# Patient Record
Sex: Female | Born: 1945
Health system: Southern US, Community
[De-identification: ages and names within clinical notes are randomized; demographics above are authoritative.]

## PROBLEM LIST (undated history)

## (undated) DIAGNOSIS — C9110 Chronic lymphocytic leukemia of B-cell type not having achieved remission: Secondary | ICD-10-CM

## (undated) DIAGNOSIS — I219 Acute myocardial infarction, unspecified: Secondary | ICD-10-CM

## (undated) DIAGNOSIS — C801 Malignant (primary) neoplasm, unspecified: Secondary | ICD-10-CM

## (undated) DIAGNOSIS — E039 Hypothyroidism, unspecified: Secondary | ICD-10-CM

## (undated) DIAGNOSIS — I7090 Unspecified atherosclerosis: Secondary | ICD-10-CM

## (undated) DIAGNOSIS — J40 Bronchitis, not specified as acute or chronic: Secondary | ICD-10-CM

## (undated) DIAGNOSIS — I10 Essential (primary) hypertension: Secondary | ICD-10-CM

## (undated) DIAGNOSIS — I2510 Atherosclerotic heart disease of native coronary artery without angina pectoris: Secondary | ICD-10-CM

## (undated) DIAGNOSIS — E785 Hyperlipidemia, unspecified: Secondary | ICD-10-CM

## (undated) DIAGNOSIS — K219 Gastro-esophageal reflux disease without esophagitis: Secondary | ICD-10-CM

## (undated) DIAGNOSIS — G629 Polyneuropathy, unspecified: Secondary | ICD-10-CM

## (undated) DIAGNOSIS — B009 Herpesviral infection, unspecified: Secondary | ICD-10-CM

## (undated) DIAGNOSIS — J45909 Unspecified asthma, uncomplicated: Secondary | ICD-10-CM

## (undated) DIAGNOSIS — F329 Major depressive disorder, single episode, unspecified: Secondary | ICD-10-CM

## (undated) DIAGNOSIS — M549 Dorsalgia, unspecified: Secondary | ICD-10-CM

## (undated) DIAGNOSIS — R0600 Dyspnea, unspecified: Secondary | ICD-10-CM

## (undated) DIAGNOSIS — F32A Depression, unspecified: Secondary | ICD-10-CM

## (undated) DIAGNOSIS — F419 Anxiety disorder, unspecified: Secondary | ICD-10-CM

## (undated) DIAGNOSIS — G4730 Sleep apnea, unspecified: Secondary | ICD-10-CM

## (undated) DIAGNOSIS — E7800 Pure hypercholesterolemia, unspecified: Secondary | ICD-10-CM

## (undated) DIAGNOSIS — Z87442 Personal history of urinary calculi: Secondary | ICD-10-CM

## (undated) DIAGNOSIS — G2581 Restless legs syndrome: Secondary | ICD-10-CM

## (undated) DIAGNOSIS — Z972 Presence of dental prosthetic device (complete) (partial): Secondary | ICD-10-CM

## (undated) HISTORY — DX: Chronic lymphocytic leukemia of B-cell type not having achieved remission: C91.10

## (undated) HISTORY — DX: Hyperlipidemia, unspecified: E78.5

## (undated) HISTORY — DX: Herpesviral infection, unspecified: B00.9

## (undated) HISTORY — DX: Anxiety disorder, unspecified: F41.9

## (undated) HISTORY — DX: Depression, unspecified: F32.A

## (undated) HISTORY — DX: Major depressive disorder, single episode, unspecified: F32.9

## (undated) HISTORY — DX: Dorsalgia, unspecified: M54.9

## (undated) HISTORY — PX: VAGINAL HYSTERECTOMY: SUR661

## (undated) HISTORY — DX: Unspecified atherosclerosis: I70.90

## (undated) HISTORY — PX: DILATION AND CURETTAGE OF UTERUS: SHX78

## (undated) HISTORY — PX: EYE SURGERY: SHX253

## (undated) HISTORY — PX: TUBAL LIGATION: SHX77

## (undated) HISTORY — PX: FRACTURE SURGERY: SHX138

---

## 1997-07-24 DIAGNOSIS — I219 Acute myocardial infarction, unspecified: Secondary | ICD-10-CM

## 1997-07-24 HISTORY — DX: Acute myocardial infarction, unspecified: I21.9

## 1997-12-12 ENCOUNTER — Inpatient Hospital Stay (HOSPITAL_COMMUNITY): Admission: AD | Admit: 1997-12-12 | Discharge: 1997-12-15 | Payer: Self-pay | Admitting: Cardiology

## 2001-03-06 ENCOUNTER — Emergency Department (HOSPITAL_COMMUNITY): Admission: EM | Admit: 2001-03-06 | Discharge: 2001-03-06 | Payer: Self-pay | Admitting: *Deleted

## 2001-05-16 ENCOUNTER — Ambulatory Visit (HOSPITAL_COMMUNITY): Admission: RE | Admit: 2001-05-16 | Discharge: 2001-05-16 | Payer: Self-pay | Admitting: General Surgery

## 2001-07-19 ENCOUNTER — Ambulatory Visit (HOSPITAL_COMMUNITY): Admission: RE | Admit: 2001-07-19 | Discharge: 2001-07-19 | Payer: Self-pay | Admitting: *Deleted

## 2001-09-26 ENCOUNTER — Encounter: Payer: Self-pay | Admitting: Internal Medicine

## 2001-09-26 ENCOUNTER — Ambulatory Visit (HOSPITAL_COMMUNITY): Admission: RE | Admit: 2001-09-26 | Discharge: 2001-09-26 | Payer: Self-pay | Admitting: Internal Medicine

## 2006-01-04 ENCOUNTER — Emergency Department: Payer: Self-pay | Admitting: Emergency Medicine

## 2006-01-29 ENCOUNTER — Emergency Department: Payer: Self-pay | Admitting: Emergency Medicine

## 2006-02-27 ENCOUNTER — Ambulatory Visit: Payer: Self-pay | Admitting: Cardiovascular Disease

## 2006-03-01 ENCOUNTER — Emergency Department: Payer: Self-pay

## 2006-03-02 ENCOUNTER — Other Ambulatory Visit: Payer: Self-pay

## 2006-07-24 HISTORY — PX: CORONARY ANGIOPLASTY WITH STENT PLACEMENT: SHX49

## 2007-04-22 ENCOUNTER — Other Ambulatory Visit: Payer: Self-pay

## 2007-04-22 ENCOUNTER — Inpatient Hospital Stay: Payer: Self-pay | Admitting: Cardiovascular Disease

## 2007-10-17 ENCOUNTER — Emergency Department: Payer: Self-pay | Admitting: Emergency Medicine

## 2007-11-15 ENCOUNTER — Emergency Department: Payer: Self-pay | Admitting: Emergency Medicine

## 2007-12-26 ENCOUNTER — Emergency Department: Payer: Self-pay | Admitting: Emergency Medicine

## 2008-08-20 ENCOUNTER — Ambulatory Visit: Payer: Self-pay | Admitting: Gastroenterology

## 2009-05-07 ENCOUNTER — Emergency Department: Payer: Self-pay | Admitting: Emergency Medicine

## 2009-06-03 ENCOUNTER — Ambulatory Visit: Payer: Self-pay | Admitting: Family Medicine

## 2009-10-04 ENCOUNTER — Ambulatory Visit: Payer: Self-pay | Admitting: Ophthalmology

## 2009-10-18 ENCOUNTER — Ambulatory Visit: Payer: Self-pay | Admitting: Ophthalmology

## 2009-11-08 ENCOUNTER — Emergency Department: Payer: Self-pay | Admitting: Emergency Medicine

## 2009-12-01 ENCOUNTER — Ambulatory Visit: Payer: Self-pay | Admitting: Ophthalmology

## 2009-12-13 ENCOUNTER — Ambulatory Visit: Payer: Self-pay | Admitting: Ophthalmology

## 2009-12-28 ENCOUNTER — Ambulatory Visit: Payer: Self-pay | Admitting: Cardiovascular Disease

## 2010-01-18 ENCOUNTER — Emergency Department: Payer: Self-pay | Admitting: Unknown Physician Specialty

## 2010-05-04 ENCOUNTER — Ambulatory Visit: Payer: Self-pay | Admitting: Gastroenterology

## 2010-05-06 LAB — PATHOLOGY REPORT

## 2010-06-25 ENCOUNTER — Ambulatory Visit: Payer: Self-pay | Admitting: Family Medicine

## 2010-12-14 ENCOUNTER — Ambulatory Visit: Payer: Self-pay | Admitting: Family Medicine

## 2010-12-25 ENCOUNTER — Emergency Department: Payer: Self-pay | Admitting: Internal Medicine

## 2011-09-12 ENCOUNTER — Ambulatory Visit: Payer: Self-pay | Admitting: Cardiovascular Disease

## 2012-02-07 ENCOUNTER — Ambulatory Visit: Payer: Self-pay | Admitting: Family Medicine

## 2013-06-26 ENCOUNTER — Emergency Department: Payer: Self-pay | Admitting: Internal Medicine

## 2013-08-05 ENCOUNTER — Emergency Department: Payer: Self-pay | Admitting: Emergency Medicine

## 2013-08-05 ENCOUNTER — Ambulatory Visit: Payer: Self-pay | Admitting: Family Medicine

## 2014-03-19 ENCOUNTER — Emergency Department: Payer: Self-pay | Admitting: Emergency Medicine

## 2014-04-08 ENCOUNTER — Emergency Department: Payer: Self-pay | Admitting: Emergency Medicine

## 2014-07-27 ENCOUNTER — Emergency Department: Payer: Self-pay | Admitting: Emergency Medicine

## 2014-07-27 DIAGNOSIS — Z880 Allergy status to penicillin: Secondary | ICD-10-CM | POA: Diagnosis not present

## 2014-07-27 DIAGNOSIS — I10 Essential (primary) hypertension: Secondary | ICD-10-CM | POA: Diagnosis not present

## 2014-07-27 DIAGNOSIS — Z720 Tobacco use: Secondary | ICD-10-CM | POA: Diagnosis not present

## 2014-07-27 DIAGNOSIS — R0789 Other chest pain: Secondary | ICD-10-CM | POA: Diagnosis not present

## 2014-07-27 DIAGNOSIS — S20219A Contusion of unspecified front wall of thorax, initial encounter: Secondary | ICD-10-CM | POA: Diagnosis not present

## 2014-07-27 DIAGNOSIS — S20212A Contusion of left front wall of thorax, initial encounter: Secondary | ICD-10-CM | POA: Diagnosis not present

## 2014-07-27 DIAGNOSIS — S299XXA Unspecified injury of thorax, initial encounter: Secondary | ICD-10-CM | POA: Diagnosis not present

## 2014-08-06 ENCOUNTER — Ambulatory Visit: Payer: Self-pay | Admitting: Family Medicine

## 2014-08-06 DIAGNOSIS — Z1231 Encounter for screening mammogram for malignant neoplasm of breast: Secondary | ICD-10-CM | POA: Diagnosis not present

## 2014-08-13 DIAGNOSIS — M545 Low back pain: Secondary | ICD-10-CM | POA: Diagnosis not present

## 2014-08-13 DIAGNOSIS — Z79899 Other long term (current) drug therapy: Secondary | ICD-10-CM | POA: Diagnosis not present

## 2014-08-13 DIAGNOSIS — E8889 Other specified metabolic disorders: Secondary | ICD-10-CM | POA: Diagnosis not present

## 2014-08-13 DIAGNOSIS — E539 Vitamin B deficiency, unspecified: Secondary | ICD-10-CM | POA: Diagnosis not present

## 2014-08-13 DIAGNOSIS — F17210 Nicotine dependence, cigarettes, uncomplicated: Secondary | ICD-10-CM | POA: Diagnosis not present

## 2014-08-13 DIAGNOSIS — G4700 Insomnia, unspecified: Secondary | ICD-10-CM | POA: Diagnosis not present

## 2014-08-13 DIAGNOSIS — F329 Major depressive disorder, single episode, unspecified: Secondary | ICD-10-CM | POA: Diagnosis not present

## 2014-08-21 DIAGNOSIS — R079 Chest pain, unspecified: Secondary | ICD-10-CM | POA: Diagnosis not present

## 2014-08-21 DIAGNOSIS — I2510 Atherosclerotic heart disease of native coronary artery without angina pectoris: Secondary | ICD-10-CM | POA: Diagnosis not present

## 2014-08-25 DIAGNOSIS — I739 Peripheral vascular disease, unspecified: Secondary | ICD-10-CM | POA: Diagnosis not present

## 2014-08-25 DIAGNOSIS — I2510 Atherosclerotic heart disease of native coronary artery without angina pectoris: Secondary | ICD-10-CM | POA: Diagnosis not present

## 2014-08-31 DIAGNOSIS — D491 Neoplasm of unspecified behavior of respiratory system: Secondary | ICD-10-CM | POA: Diagnosis not present

## 2014-09-09 DIAGNOSIS — R5383 Other fatigue: Secondary | ICD-10-CM | POA: Diagnosis not present

## 2014-09-09 DIAGNOSIS — E559 Vitamin D deficiency, unspecified: Secondary | ICD-10-CM | POA: Diagnosis not present

## 2014-09-09 DIAGNOSIS — I10 Essential (primary) hypertension: Secondary | ICD-10-CM | POA: Diagnosis not present

## 2014-09-09 DIAGNOSIS — E785 Hyperlipidemia, unspecified: Secondary | ICD-10-CM | POA: Diagnosis not present

## 2014-09-09 DIAGNOSIS — F17200 Nicotine dependence, unspecified, uncomplicated: Secondary | ICD-10-CM | POA: Diagnosis not present

## 2014-09-09 DIAGNOSIS — E780 Pure hypercholesterolemia: Secondary | ICD-10-CM | POA: Diagnosis not present

## 2014-09-09 DIAGNOSIS — F419 Anxiety disorder, unspecified: Secondary | ICD-10-CM | POA: Diagnosis not present

## 2014-09-09 DIAGNOSIS — Z716 Tobacco abuse counseling: Secondary | ICD-10-CM | POA: Diagnosis not present

## 2014-09-25 DIAGNOSIS — R0602 Shortness of breath: Secondary | ICD-10-CM | POA: Diagnosis not present

## 2014-09-25 DIAGNOSIS — J449 Chronic obstructive pulmonary disease, unspecified: Secondary | ICD-10-CM | POA: Diagnosis not present

## 2014-09-25 DIAGNOSIS — R911 Solitary pulmonary nodule: Secondary | ICD-10-CM | POA: Diagnosis not present

## 2014-09-25 DIAGNOSIS — G4733 Obstructive sleep apnea (adult) (pediatric): Secondary | ICD-10-CM | POA: Diagnosis not present

## 2014-10-01 DIAGNOSIS — C3412 Malignant neoplasm of upper lobe, left bronchus or lung: Secondary | ICD-10-CM | POA: Diagnosis not present

## 2014-10-02 ENCOUNTER — Ambulatory Visit
Admit: 2014-10-02 | Disposition: A | Discharge status: Home / Self Care | Payer: Self-pay | Attending: Cardiothoracic Surgery | Admitting: Cardiothoracic Surgery

## 2014-10-08 DIAGNOSIS — R918 Other nonspecific abnormal finding of lung field: Secondary | ICD-10-CM | POA: Diagnosis not present

## 2014-10-08 DIAGNOSIS — R911 Solitary pulmonary nodule: Secondary | ICD-10-CM | POA: Diagnosis not present

## 2014-10-20 DIAGNOSIS — L570 Actinic keratosis: Secondary | ICD-10-CM | POA: Diagnosis not present

## 2014-10-20 DIAGNOSIS — L578 Other skin changes due to chronic exposure to nonionizing radiation: Secondary | ICD-10-CM | POA: Diagnosis not present

## 2014-10-23 ENCOUNTER — Ambulatory Visit
Admit: 2014-10-23 | Disposition: A | Discharge status: Home / Self Care | Payer: Self-pay | Attending: Cardiothoracic Surgery | Admitting: Cardiothoracic Surgery

## 2014-11-06 ENCOUNTER — Other Ambulatory Visit: Payer: Self-pay | Admitting: Cardiothoracic Surgery

## 2014-11-06 DIAGNOSIS — R911 Solitary pulmonary nodule: Secondary | ICD-10-CM

## 2014-12-01 ENCOUNTER — Encounter: Payer: Self-pay | Admitting: Emergency Medicine

## 2014-12-01 ENCOUNTER — Emergency Department
Admission: EM | Admit: 2014-12-01 | Discharge: 2014-12-01 | Disposition: A | Discharge status: Home / Self Care | Payer: Medicare Other | Attending: Emergency Medicine | Admitting: Emergency Medicine

## 2014-12-01 ENCOUNTER — Emergency Department: Payer: Medicare Other

## 2014-12-01 DIAGNOSIS — S63501A Unspecified sprain of right wrist, initial encounter: Secondary | ICD-10-CM | POA: Insufficient documentation

## 2014-12-01 DIAGNOSIS — Y998 Other external cause status: Secondary | ICD-10-CM | POA: Insufficient documentation

## 2014-12-01 DIAGNOSIS — X58XXXA Exposure to other specified factors, initial encounter: Secondary | ICD-10-CM | POA: Diagnosis not present

## 2014-12-01 DIAGNOSIS — M25531 Pain in right wrist: Secondary | ICD-10-CM | POA: Diagnosis not present

## 2014-12-01 DIAGNOSIS — I10 Essential (primary) hypertension: Secondary | ICD-10-CM | POA: Insufficient documentation

## 2014-12-01 DIAGNOSIS — Y9289 Other specified places as the place of occurrence of the external cause: Secondary | ICD-10-CM | POA: Insufficient documentation

## 2014-12-01 DIAGNOSIS — Z720 Tobacco use: Secondary | ICD-10-CM | POA: Diagnosis not present

## 2014-12-01 DIAGNOSIS — M7989 Other specified soft tissue disorders: Secondary | ICD-10-CM | POA: Diagnosis not present

## 2014-12-01 DIAGNOSIS — Y9389 Activity, other specified: Secondary | ICD-10-CM | POA: Diagnosis not present

## 2014-12-01 DIAGNOSIS — S6991XA Unspecified injury of right wrist, hand and finger(s), initial encounter: Secondary | ICD-10-CM | POA: Diagnosis present

## 2014-12-01 HISTORY — DX: Pure hypercholesterolemia, unspecified: E78.00

## 2014-12-01 HISTORY — DX: Acute myocardial infarction, unspecified: I21.9

## 2014-12-01 HISTORY — DX: Essential (primary) hypertension: I10

## 2014-12-01 MED ORDER — TRAMADOL HCL 50 MG PO TABS: 50.0000 mg | ORAL_TABLET | Freq: Four times a day (QID) | ORAL | Status: DC | PRN

## 2014-12-01 MED ORDER — PREDNISONE 20 MG PO TABS
ORAL_TABLET | ORAL | Status: AC
  Filled 2014-12-01: qty 3

## 2014-12-01 MED ORDER — PREDNISONE 20 MG PO TABS
60.0000 mg | ORAL_TABLET | Freq: Once | ORAL | Status: AC
  Administered 2014-12-01: 60 mg via ORAL

## 2014-12-01 MED ORDER — TRAMADOL HCL 50 MG PO TABS
ORAL_TABLET | ORAL | Status: AC
  Filled 2014-12-01: qty 1

## 2014-12-01 MED ORDER — TRAMADOL HCL 50 MG PO TABS
50.0000 mg | ORAL_TABLET | Freq: Once | ORAL | Status: AC
  Administered 2014-12-01: 50 mg via ORAL

## 2014-12-01 MED ORDER — PREDNISONE 20 MG PO TABS: 20.0000 mg | ORAL_TABLET | Freq: Every day | ORAL | Status: DC

## 2014-12-01 NOTE — ED Notes (Signed)
C/o right hand pain and swelling, does not remember hurting hand, swelling extends into wrist

## 2014-12-01 NOTE — ED Provider Notes (Signed)
[facility] Emergency Department Provider Note  ____________________________________________  Time seen: Approximately1545 I have reviewed the triage vital signs and the nursing notes.   HISTORY  Chief Complaint Hand Pain    HPI Karen [name] is a 69 y.o. female complaining of right wrist pain that is developed today yesterday she was picking up her grandchildren and great-grandchildren denies any numbness tingling or weakness rates the pain as about a 6-8 out of 10 depending on movement or touch nothing seemingly making it better any type of movement or touch making it worse is any other complaints associated symptoms at this time    Past Medical History  Diagnosis Date  . Hypertension   . Hypercholesteremia   . Myocardial infarction     There are no active problems to display for this patient.   Past Surgical History  Procedure Laterality Date  . Abdominal hysterectomy    . Tubal ligation      Current Outpatient Rx  Name  Route  Sig  Dispense  Refill  . predniSONE (DELTASONE) 20 MG tablet   Oral   Take 1 tablet (20 mg total) by mouth daily.   5 tablet   0   . traMADol (ULTRAM) 50 MG tablet   Oral   Take 1 tablet (50 mg total) by mouth every 6 (six) hours as needed.   12 tablet   0     Allergies Celebrex; Requip; and Macrobid  History reviewed. No pertinent family history.  Social History History  Substance Use Topics  . Smoking status: Current Every Day Smoker  . Smokeless tobacco: Not on file  . Alcohol Use: No    Review of Systems Constitutional: No fever/chills Eyes: No visual changes. ENT: No sore throat. Cardiovascular: Denies chest pain. Respiratory: Denies shortness of breath. Gastrointestinal: No abdominal pain.  No nausea, no vomiting.  No diarrhea.  No constipation. Genitourinary: Negative for dysuria. Musculoskeletal: Negative for back pain. Skin: Negative for rash. Neurological: Negative for  headaches, focal weakness or numbness.  6-point ROS otherwise negative.  ____________________________________________   PHYSICAL EXAM:  VITAL SIGNS: ED Triage Vitals  Enc Vitals Group     BP 12/01/14 1501 144/88 mmHg     Pulse Rate 12/01/14 1501 59     Resp --      Temp 12/01/14 1501 98.4 F (36.9 C)     Temp Source 12/01/14 1501 Oral     SpO2 12/01/14 1501 100 %     Weight 12/01/14 1501 156 lb (70.761 kg)     Height 12/01/14 1501 '5\' 3"'$  (1.6 m)     Head Cir --      Peak Flow --      Pain Score 12/01/14 1502 9     Pain Loc --      Pain Edu? --      Excl. in [place]? --     Constitutional: Alert and oriented. Well appearing and in no acute distress. Eyes: Conjunctivae are normal. PERRL. EOMI. Head: Atraumatic. Neck: No stridor.   Cardiovascular: Normal rate, regular rhythm. Grossly normal heart sounds.  Good peripheral circulation. Respiratory: Normal respiratory effort.  No retractions. Lungs CTAB. Musculoskeletal: Tenderness and swelling to the palmar aspect of her wrist no palpable deformity or abnormality no functional deficit Neurologic:  Normal speech and language. No gross focal neurologic deficits are appreciated. Speech is normal. No gait instability. Skin:  Skin is warm, dry and intact. No rash noted. Psychiatric: Mood and affect are normal. Speech  and behavior are normal.  ____________________________________________     RADIOLOGY  IMPRESSION: Widening of scaphoid lunate space is noted suggesting injury of the scaphoid lunate ligament. No fracture or dislocation is noted.   Electronically Signed By: Marijo Conception, M.D. On: 12/01/2014 15:30 ____________________________________________   PROCEDURES  Procedure(s) performed: None  Critical Care performed: No  ____________________________________________   INITIAL IMPRESSION / ASSESSMENT AND PLAN / ED COURSE  Pertinent labs & imaging results that were available during my care of the patient  were reviewed by me and considered in my medical decision making (see chart for details).  Right wrist sprain patient was placed in a Velcro respiratory discomfort recommending ice to the area rest follow-up with orthopedics due to radiology findings of widening of the scaphoid joint Pfister to quit picking up her grandchildren in the meantime and give her wrist some rest ____________________________________________   FINAL CLINICAL IMPRESSION(S) / ED DIAGNOSES  Final diagnoses:  Wrist sprain, right, initial encounter     Tiann Saha Verdene Rio, PA-C 12/01/14 [place], MD 12/05/14 1657

## 2014-12-01 NOTE — Discharge Instructions (Signed)
Cryotherapy Cryotherapy means treatment with cold. Ice or gel packs can be used to reduce both pain and swelling. Ice is the most helpful within the first 24 to 48 hours after an injury or flare-up from overusing a muscle or joint. Sprains, strains, spasms, burning pain, shooting pain, and aches can all be eased with ice. Ice can also be used when recovering from surgery. Ice is effective, has very few side effects, and is safe for most people to use. PRECAUTIONS  Ice is not a safe treatment option for people with:  Raynaud phenomenon. This is a condition affecting small blood vessels in the extremities. Exposure to cold may cause your problems to return.  Cold hypersensitivity. There are many forms of cold hypersensitivity, including:  Cold urticaria. Red, itchy hives appear on the skin when the tissues begin to warm after being iced.  Cold erythema. This is a red, itchy rash caused by exposure to cold.  Cold hemoglobinuria. Red blood cells break down when the tissues begin to warm after being iced. The hemoglobin that carry oxygen are passed into the urine because they cannot combine with blood proteins fast enough.  Numbness or altered sensitivity in the area being iced. If you have any of the following conditions, do not use ice until you have discussed cryotherapy with your caregiver:  Heart conditions, such as arrhythmia, angina, or chronic heart disease.  High blood pressure.  Healing wounds or open skin in the area being iced.  Current infections.  Rheumatoid arthritis.  Poor circulation.  Diabetes. Ice slows the blood flow in the region it is applied. This is beneficial when trying to stop inflamed tissues from spreading irritating chemicals to surrounding tissues. However, if you expose your skin to cold temperatures for too long or without the proper protection, you can damage your skin or nerves. Watch for signs of skin damage due to cold. HOME CARE INSTRUCTIONS Follow  these tips to use ice and cold packs safely.  Place a dry or damp towel between the ice and skin. A damp towel will cool the skin more quickly, so you may need to shorten the time that the ice is used.  For a more rapid response, add gentle compression to the ice.  Ice for no more than 10 to 20 minutes at a time. The bonier the area you are icing, the less time it will take to get the benefits of ice.  Check your skin after 5 minutes to make sure there are no signs of a poor response to cold or skin damage.  Rest 20 minutes or more between uses.  Once your skin is numb, you can end your treatment. You can test numbness by very lightly touching your skin. The touch should be so light that you do not see the skin dimple from the pressure of your fingertip. When using ice, most people will feel these normal sensations in this order: cold, burning, aching, and numbness.  Do not use ice on someone who cannot communicate their responses to pain, such as small children or people with dementia. HOW TO MAKE AN ICE PACK Ice packs are the most common way to use ice therapy. Other methods include ice massage, ice baths, and cryosprays. Muscle creams that cause a cold, tingly feeling do not offer the same benefits that ice offers and should not be used as a substitute unless recommended by your caregiver. To make an ice pack, do one of the following:  Place crushed ice or a  bag of frozen vegetables in a sealable plastic bag. Squeeze out the excess air. Place this bag inside another plastic bag. Slide the bag into a pillowcase or place a damp towel between your skin and the bag.  Mix 3 parts water with 1 part rubbing alcohol. Freeze the mixture in a sealable plastic bag. When you remove the mixture from the freezer, it will be slushy. Squeeze out the excess air. Place this bag inside another plastic bag. Slide the bag into a pillowcase or place a damp towel between your skin and the bag. SEEK MEDICAL CARE  IF:  You develop white spots on your skin. This may give the skin a blotchy (mottled) appearance.  Your skin turns blue or pale.  Your skin becomes waxy or hard.  Your swelling gets worse. MAKE SURE YOU:   Understand these instructions.  Will watch your condition.  Will get help right away if you are not doing well or get worse. Document Released: 03/06/2011 Document Revised: 11/24/2013 Document Reviewed: 03/06/2011 [facility] Patient Information 2015 [place], [place]. This information is not intended to replace advice given to you by your health care provider. Make sure you discuss any questions you have with your health care provider.  Ligament Sprain Ligaments are tough, fibrous tissues that hold bones together at the joints. A sprain can occur when a ligament is stretched. This injury may take several weeks to heal. [place] the injured area for as long as directed by your caregiver. Then slowly start using the joint as directed by your caregiver and as the pain allows.  Keep the affected joint raised if possible to lessen swelling.  Apply ice for 15-20 minutes to the injured area every couple hours for the first half day, then 03-04 times per day for the first 48 hours. Put the ice in a plastic bag and place a towel between the bag of ice and your skin.  Wear any splinting, casting, or elastic bandage applications as instructed.  Only take over-the-counter or prescription medicines for pain, discomfort, or fever as directed by your caregiver. Do not use aspirin immediately after the injury unless instructed by your caregiver. Aspirin can cause increased bleeding and bruising of the tissues.  If you were given crutches, continue to use them as instructed and do not resume weight bearing on the affected extremity until instructed. SEEK MEDICAL CARE IF:   Your bruising, swelling, or pain increases.  You have cold and numb fingers or toes if your arm or leg  was injured. SEEK IMMEDIATE MEDICAL CARE IF:   Your toes are numb or blue if your leg was injured.  Your fingers are numb or blue if your arm was injured.  Your pain is not responding to medicines and continues to stay the same or gets worse. MAKE SURE YOU:   Understand these instructions.  Will watch your condition.  Will get help right away if you are not doing well or get worse. Document Released: 07/07/2000 Document Revised: 10/02/2011 Document Reviewed: 05/05/2008 [facility] Patient Information 2015 [place], [place]. This information is not intended to replace advice given to you by your health care provider. Make sure you discuss any questions you have with your health care provider.

## 2014-12-03 DIAGNOSIS — I119 Hypertensive heart disease without heart failure: Secondary | ICD-10-CM | POA: Diagnosis not present

## 2014-12-31 ENCOUNTER — Emergency Department: Payer: Medicare Other

## 2014-12-31 ENCOUNTER — Emergency Department
Admission: EM | Admit: 2014-12-31 | Discharge: 2014-12-31 | Disposition: A | Discharge status: Home / Self Care | Payer: Medicare Other | Attending: Emergency Medicine | Admitting: Emergency Medicine

## 2014-12-31 ENCOUNTER — Encounter: Payer: Self-pay | Admitting: Emergency Medicine

## 2014-12-31 DIAGNOSIS — Z720 Tobacco use: Secondary | ICD-10-CM | POA: Diagnosis not present

## 2014-12-31 DIAGNOSIS — I10 Essential (primary) hypertension: Secondary | ICD-10-CM | POA: Diagnosis not present

## 2014-12-31 DIAGNOSIS — J439 Emphysema, unspecified: Secondary | ICD-10-CM | POA: Diagnosis not present

## 2014-12-31 DIAGNOSIS — R05 Cough: Secondary | ICD-10-CM | POA: Diagnosis not present

## 2014-12-31 MED ORDER — PSEUDOEPH-BROMPHEN-DM 30-2-10 MG/5ML PO SYRP: 5.0000 mL | ORAL_SOLUTION | Freq: Four times a day (QID) | ORAL | Status: DC | PRN

## 2014-12-31 MED ORDER — METHYLPREDNISOLONE 4 MG PO TBPK: ORAL_TABLET | ORAL | Status: DC

## 2014-12-31 NOTE — ED Provider Notes (Signed)
[facility] Emergency Department Provider Note  ____________________________________________  Time seen: Approximately 7:44 PM  I have reviewed the triage vital signs and the nursing notes.   HISTORY  Chief Complaint Cough and Nasal Congestion    HPI Karen [name] is a 69 y.o. female complaining of 2 weeks of cough congestion. Denies any nasal congestion denies any fever or chills or nausea vomiting diarrhea. Patient stated this chest pain secondary to coughing and rates her overall discomfort pain as a 5/10. Patient admits long-term tobacco use. States cough is nonproductive and she has intermittent wheezing. Has not taken anything for her complaint. The coughing and wheezing is not decreasing her tobacco use.   Past Medical History  Diagnosis Date  . Hypertension   . Hypercholesteremia   . Myocardial infarction     There are no active problems to display for this patient.   Past Surgical History  Procedure Laterality Date  . Abdominal hysterectomy    . Tubal ligation      Current Outpatient Rx  Name  Route  Sig  Dispense  Refill  . brompheniramine-pseudoephedrine-DM 30-2-10 MG/5ML syrup   Oral   Take 5 mLs by mouth 4 (four) times daily as needed.   120 mL   0   . methylPREDNISolone (MEDROL DOSEPAK) 4 MG TBPK tablet      Take Tapered dose as directed   21 tablet   0   . traMADol (ULTRAM) 50 MG tablet   Oral   Take 1 tablet (50 mg total) by mouth every 6 (six) hours as needed.   12 tablet   0     Allergies Celebrex; Requip; and Macrobid  No family history on file.  Social History History  Substance Use Topics  . Smoking status: Current Every Day Smoker -- 0.50 packs/day for 40 years    Types: Cigarettes  . Smokeless tobacco: Not on file  . Alcohol Use: No    Review of Systems Constitutional: No fever/chills Eyes: No visual changes. ENT: No sore throat. Cardiovascular: Denies chest pain. Respiratory: No  productive cough congestion and intermittent wheezing. Gastrointestinal: No abdominal pain.  No nausea, no vomiting.  No diarrhea.  No constipation. Genitourinary: Negative for dysuria. Musculoskeletal: Negative for back pain. Skin: Negative for rash. Neurological: Negative for headaches, focal weakness or numbness.  10-point ROS otherwise negative.  ____________________________________________   PHYSICAL EXAM:  VITAL SIGNS: ED Triage Vitals  Enc Vitals Group     BP 12/31/14 1923 145/74 mmHg     Pulse Rate 12/31/14 1923 56     Resp 12/31/14 1923 18     Temp 12/31/14 1923 98.6 F (37 C)     Temp Source 12/31/14 1923 Oral     SpO2 12/31/14 1923 100 %     Weight 12/31/14 1923 156 lb (70.761 kg)     Height 12/31/14 1923 '5\' 2"'$  (1.575 m)     Head Cir --      Peak Flow --      Pain Score 12/31/14 1923 5     Pain Loc --      Pain Edu? --      Excl. in [place]? --    Constitutional: Alert and oriented. Well appearing and in no acute distress. Eyes: Conjunctivae are normal. PERRL. EOMI. Head: Atraumatic. Nose: No congestion/rhinnorhea. Mouth/Throat: Mucous membranes are moist.  Oropharynx non-erythematous. Neck: No stridor.  No deformity for nuchal range of motion nontender palpation. Hematological/Lymphatic/Immunilogical: No cervical lymphadenopathy. Cardiovascular: Normal rate, regular  rhythm. Grossly normal heart sounds.  Good peripheral circulation. Respiratory: Normal respiratory effort.  No retractions. Mild rales with deep inspiration. Gastrointestinal: Soft and nontender. No distention. No abdominal bruits. No CVA tenderness. Musculoskeletal: No lower extremity tenderness nor edema.  No joint effusions. Neurologic:  Normal speech and language. No gross focal neurologic deficits are appreciated. Speech is normal. No gait instability. Skin:  Skin is warm, dry and intact. No rash noted. Psychiatric: Mood and affect are normal. Speech and behavior are  normal.  ____________________________________________   LABS (all labs ordered are listed, but only abnormal results are displayed)  Labs Reviewed - No data to display ____________________________________________  EKG   ____________________________________________  RADIOLOGY  Findings consistent with emphysema ____________________________________________   PROCEDURES  Procedure(s) performed: None  Critical Care performed: No  ____________________________________________   INITIAL IMPRESSION / ASSESSMENT AND PLAN / ED COURSE  Pertinent labs & imaging results that were available during my care of the patient were reviewed by me and considered in my medical decision making (see chart for details).  Emphysema ____________________________________________   FINAL CLINICAL IMPRESSION(S) / ED DIAGNOSES  Final diagnoses:  Chronic emphysema syndrome      Sable Feil, PA-C 12/31/14 2111  Lisa Roca, MD 12/31/14 2250

## 2014-12-31 NOTE — Discharge Instructions (Signed)
Advised to discontinue tobacco use.

## 2014-12-31 NOTE — ED Notes (Signed)
Patient with complaint of cough and congestion times two weeks. Denies fever.

## 2015-01-05 DIAGNOSIS — L570 Actinic keratosis: Secondary | ICD-10-CM | POA: Diagnosis not present

## 2015-02-03 DIAGNOSIS — I119 Hypertensive heart disease without heart failure: Secondary | ICD-10-CM | POA: Diagnosis not present

## 2015-02-03 DIAGNOSIS — E784 Other hyperlipidemia: Secondary | ICD-10-CM | POA: Diagnosis not present

## 2015-02-03 DIAGNOSIS — R5381 Other malaise: Secondary | ICD-10-CM | POA: Diagnosis not present

## 2015-02-03 DIAGNOSIS — I10 Essential (primary) hypertension: Secondary | ICD-10-CM | POA: Diagnosis not present

## 2015-02-16 DIAGNOSIS — R911 Solitary pulmonary nodule: Secondary | ICD-10-CM | POA: Diagnosis not present

## 2015-02-16 DIAGNOSIS — I340 Nonrheumatic mitral (valve) insufficiency: Secondary | ICD-10-CM | POA: Diagnosis not present

## 2015-02-16 DIAGNOSIS — I2510 Atherosclerotic heart disease of native coronary artery without angina pectoris: Secondary | ICD-10-CM | POA: Diagnosis not present

## 2015-02-16 DIAGNOSIS — K219 Gastro-esophageal reflux disease without esophagitis: Secondary | ICD-10-CM | POA: Diagnosis not present

## 2015-02-22 DIAGNOSIS — G4730 Sleep apnea, unspecified: Secondary | ICD-10-CM | POA: Diagnosis not present

## 2015-02-22 DIAGNOSIS — I2510 Atherosclerotic heart disease of native coronary artery without angina pectoris: Secondary | ICD-10-CM | POA: Diagnosis not present

## 2015-02-22 DIAGNOSIS — Z955 Presence of coronary angioplasty implant and graft: Secondary | ICD-10-CM | POA: Diagnosis not present

## 2015-02-22 DIAGNOSIS — I119 Hypertensive heart disease without heart failure: Secondary | ICD-10-CM | POA: Diagnosis not present

## 2015-04-08 ENCOUNTER — Other Ambulatory Visit: Payer: Self-pay | Admitting: Cardiothoracic Surgery

## 2015-04-08 ENCOUNTER — Ambulatory Visit
Admission: RE | Admit: 2015-04-08 | Discharge: 2015-04-08 | Disposition: A | Discharge status: Home / Self Care | Payer: Medicare Other | Source: Ambulatory Visit | Attending: Cardiothoracic Surgery | Admitting: Cardiothoracic Surgery

## 2015-04-08 ENCOUNTER — Encounter: Payer: Self-pay | Admitting: Cardiothoracic Surgery

## 2015-04-08 ENCOUNTER — Inpatient Hospital Stay: Payer: Medicare Other | Attending: Cardiothoracic Surgery | Admitting: Cardiothoracic Surgery

## 2015-04-08 VITALS — BP 179/76 | HR 49 | Temp 98.8°F | Resp 20 | Ht 62.0 in | Wt 160.9 lb

## 2015-04-08 DIAGNOSIS — R918 Other nonspecific abnormal finding of lung field: Secondary | ICD-10-CM | POA: Insufficient documentation

## 2015-04-08 DIAGNOSIS — F17210 Nicotine dependence, cigarettes, uncomplicated: Secondary | ICD-10-CM | POA: Insufficient documentation

## 2015-04-08 DIAGNOSIS — R911 Solitary pulmonary nodule: Secondary | ICD-10-CM

## 2015-04-08 NOTE — Progress Notes (Signed)
Karen [name] Inpatient Post-Op Note  Patient ID: Karen [name], female   DOB: 1946-04-06, 69 y.o.   MRN: [id number]  HISTORY: This patient returns today in follow-up. She is found to have a small in the lung and has been followed for that. She comes in today without any new complaints. She states that she is not short of breath. She does continue to smoke at least a pack cigarettes a day.   Filed Vitals:   04/08/15 1400  BP: 179/76  Pulse: 49  Temp: 98.8 F (37.1 C)  Resp: 20     EXAM: Resp: Lungs are clear bilaterally.  No respiratory distress, normal effort. Heart:  Regular without murmurs Abd:  Abdomen is soft, non distended and non tender. No masses are palpable.  There is no rebound and no guarding.  Neurological: Alert and oriented to person, place, and time. Coordination normal.  Skin: Skin is warm and dry. No rash noted. No diaphoretic. No erythema. No pallor.  Psychiatric: Normal mood and affect. Normal behavior. Judgment and thought content normal.    ASSESSMENT: I have independently reviewed her CT scan. I see no change in the small bilateral pulmonary nodules. I reviewed with her the options at this point. I would like to see her back again in 3 months time with another CT scan   PLAN:   We will set her up for another CT scan without contrast. She will follow-up with me at that point. Tobacco cessation was again discussed.    Nestor Lewandowsky, MD

## 2015-05-26 DIAGNOSIS — M778 Other enthesopathies, not elsewhere classified: Secondary | ICD-10-CM | POA: Insufficient documentation

## 2015-05-26 DIAGNOSIS — M7711 Lateral epicondylitis, right elbow: Secondary | ICD-10-CM | POA: Diagnosis not present

## 2015-06-23 DIAGNOSIS — I2510 Atherosclerotic heart disease of native coronary artery without angina pectoris: Secondary | ICD-10-CM | POA: Diagnosis not present

## 2015-06-23 DIAGNOSIS — I119 Hypertensive heart disease without heart failure: Secondary | ICD-10-CM | POA: Diagnosis not present

## 2015-06-23 DIAGNOSIS — F419 Anxiety disorder, unspecified: Secondary | ICD-10-CM | POA: Diagnosis not present

## 2015-06-23 DIAGNOSIS — F329 Major depressive disorder, single episode, unspecified: Secondary | ICD-10-CM | POA: Diagnosis not present

## 2015-06-24 DIAGNOSIS — I10 Essential (primary) hypertension: Secondary | ICD-10-CM | POA: Diagnosis not present

## 2015-06-24 DIAGNOSIS — I739 Peripheral vascular disease, unspecified: Secondary | ICD-10-CM | POA: Diagnosis not present

## 2015-06-24 DIAGNOSIS — G4733 Obstructive sleep apnea (adult) (pediatric): Secondary | ICD-10-CM | POA: Diagnosis not present

## 2015-06-24 DIAGNOSIS — I340 Nonrheumatic mitral (valve) insufficiency: Secondary | ICD-10-CM | POA: Diagnosis not present

## 2015-07-05 DIAGNOSIS — I739 Peripheral vascular disease, unspecified: Secondary | ICD-10-CM | POA: Diagnosis not present

## 2015-07-06 ENCOUNTER — Emergency Department: Payer: Medicare Other

## 2015-07-06 ENCOUNTER — Emergency Department
Admission: EM | Admit: 2015-07-06 | Discharge: 2015-07-06 | Disposition: A | Discharge status: Home / Self Care | Payer: Medicare Other | Attending: Emergency Medicine | Admitting: Emergency Medicine

## 2015-07-06 ENCOUNTER — Encounter: Payer: Self-pay | Admitting: Emergency Medicine

## 2015-07-06 DIAGNOSIS — F17210 Nicotine dependence, cigarettes, uncomplicated: Secondary | ICD-10-CM | POA: Diagnosis not present

## 2015-07-06 DIAGNOSIS — I10 Essential (primary) hypertension: Secondary | ICD-10-CM | POA: Insufficient documentation

## 2015-07-06 DIAGNOSIS — J157 Pneumonia due to Mycoplasma pneumoniae: Secondary | ICD-10-CM

## 2015-07-06 DIAGNOSIS — Z79899 Other long term (current) drug therapy: Secondary | ICD-10-CM | POA: Diagnosis not present

## 2015-07-06 DIAGNOSIS — R05 Cough: Secondary | ICD-10-CM | POA: Diagnosis present

## 2015-07-06 DIAGNOSIS — R079 Chest pain, unspecified: Secondary | ICD-10-CM | POA: Diagnosis not present

## 2015-07-06 DIAGNOSIS — Z7982 Long term (current) use of aspirin: Secondary | ICD-10-CM | POA: Insufficient documentation

## 2015-07-06 MED ORDER — PREDNISONE 10 MG PO TABS: 10.0000 mg | ORAL_TABLET | ORAL | Status: DC

## 2015-07-06 MED ORDER — AZITHROMYCIN 250 MG PO TABS: ORAL_TABLET | ORAL | Status: DC

## 2015-07-06 MED ORDER — BENZONATATE 200 MG PO CAPS: 200.0000 mg | ORAL_CAPSULE | Freq: Three times a day (TID) | ORAL | Status: DC | PRN

## 2015-07-06 MED ORDER — ALBUTEROL SULFATE HFA 108 (90 BASE) MCG/ACT IN AERS: 2.0000 | INHALATION_SPRAY | RESPIRATORY_TRACT | Status: DC | PRN

## 2015-07-06 NOTE — ED Notes (Signed)
Pt presents to ED with frequent productive cough for the past 2 weeks. Worse at night time. Denies fever.  Tried taking otc medications with no relief.

## 2015-07-06 NOTE — ED Notes (Signed)
AAOx3.  Skin warm and dry. NAD.  D/C home.

## 2015-07-06 NOTE — Discharge Instructions (Signed)

## 2015-07-06 NOTE — ED Provider Notes (Signed)
[facility] Emergency Department Provider Note  ____________________________________________  Time seen: Approximately 9:39 PM  I have reviewed the triage vital signs and the nursing notes.   HISTORY  Chief Complaint Cough    HPI Karen [name] is a 69 y.o. female who presents to the emergency department with a complaint of cough 2-3 weeks. She states that symptoms began insidiously with some minor nasal congestion, and cough. She states that the cough has been consistently present 2-3 weeks. She states that she has not had fevers or chills, sore throat, difficulty breathing or swallowing, chest pain, abdominal pain, nausea vomiting. She states that cough is worse at night. It is occasionally productive. She states that she has heard some audible wheezing. Patient denies history of COPD or emphysema. Patient has tried over-the-counter cough medications with no relief.   Past Medical History  Diagnosis Date  . Hypertension   . Hypercholesteremia   . Myocardial infarction ([place])     There are no active problems to display for this patient.   Past Surgical History  Procedure Laterality Date  . Abdominal hysterectomy    . Tubal ligation    . Coronary angioplasty with stent placement      Current Outpatient Rx  Name  Route  Sig  Dispense  Refill  . albuterol (PROVENTIL HFA;VENTOLIN HFA) 108 (90 BASE) MCG/ACT inhaler   Inhalation   Inhale 2 puffs into the lungs every 4 (four) hours as needed for wheezing or shortness of breath.   1 Inhaler   0   . ALPRAZolam (XANAX) 0.25 MG tablet   Oral   Take 0.25 mg by mouth at bedtime as needed for anxiety.         Marland Kitchen amLODipine (NORVASC) 10 MG tablet   Oral   Take 10 mg by mouth daily.         Marland Kitchen aspirin EC 81 MG tablet   Oral   Take 1 tablet by mouth daily. Last taken yesterday         . azithromycin (ZITHROMAX Z-PAK) 250 MG tablet      Take 2 tablets (500 mg) on  Day 1,  followed by 1 tablet  (250 mg) once daily on Days 2 through 5.   6 each   0   . benzonatate (TESSALON) 200 MG capsule   Oral   Take 1 capsule (200 mg total) by mouth 3 (three) times daily as needed for cough.   21 capsule   0   . clopidogrel (PLAVIX) 75 MG tablet   Oral   Take 1 tablet by mouth daily.         Marland Kitchen dexlansoprazole (DEXILANT) 60 MG capsule   Oral   Take 1 capsule by mouth daily.         . furosemide (LASIX) 20 MG tablet   Oral   Take 1 tablet by mouth daily.         Marland Kitchen gabapentin (NEURONTIN) 400 MG capsule   Oral   Take 1 capsule by mouth daily.         Marland Kitchen lisinopril (PRINIVIL,ZESTRIL) 10 MG tablet   Oral   Take 10 mg by mouth daily.         . metoprolol succinate (TOPROL-XL) 100 MG 24 hr tablet   Oral   Take 100 mg by mouth daily.         . nitroGLYCERIN (NITROSTAT) 0.4 MG SL tablet   Sublingual   Place 1 tablet under  the tongue every 5 (five) minutes as needed. Chest pain. May take up to 3 doses. On third dose call 911         . potassium chloride (MICRO-K) 10 MEQ CR capsule   Oral   Take 1 capsule by mouth daily.         . predniSONE (DELTASONE) 10 MG tablet   Oral   Take 1 tablet (10 mg total) by mouth as directed.   21 tablet   0     Take on a daily basis of 6, 5, 4, 3, 2, 1   . ranolazine (RANEXA) 1000 MG SR tablet   Oral   Take 1 tablet by mouth every 12 (twelve) hours.         . simvastatin (ZOCOR) 40 MG tablet   Oral   Take 1 tablet by mouth daily.         . traMADol (ULTRAM) 50 MG tablet   Oral   Take 1 tablet (50 mg total) by mouth every 6 (six) hours as needed.   12 tablet   0     Allergies Celebrex; Requip; and Macrobid  Family History  Problem Relation Age of Onset  . Heart disease Father   . Stroke Mother   . Hypertension Mother   . Lung cancer Brother   . Heart attack Brother   . Congestive Heart Failure Sister   . Cervical cancer Sister     Social History Social History  Substance Use Topics  . Smoking status:  Current Every Day Smoker -- 0.50 packs/day for 40 years    Types: Cigarettes  . Smokeless tobacco: Current User    Types: Snuff, Chew  . Alcohol Use: No    Review of Systems Constitutional: No fever/chills Eyes: No visual changes. ENT: No sore throat. Cardiovascular: Denies chest pain. Respiratory: Denies shortness of breath. Gastrointestinal: No abdominal pain.  No nausea, no vomiting.  No diarrhea.  No constipation. Genitourinary: Negative for dysuria. Musculoskeletal: Negative for back pain. Skin: Negative for rash. Neurological: Negative for headaches, focal weakness or numbness.  10-point ROS otherwise negative.  ____________________________________________   PHYSICAL EXAM:  VITAL SIGNS: ED Triage Vitals  Enc Vitals Group     BP 07/06/15 2051 142/80 mmHg     Pulse Rate 07/06/15 2051 58     Resp --      Temp 07/06/15 2051 98.7 F (37.1 C)     Temp src --      SpO2 07/06/15 2051 93 %     Weight 07/06/15 2051 167 lb (75.751 kg)     Height 07/06/15 2051 '5\' 2"'$  (1.575 m)     Head Cir --      Peak Flow --      Pain Score 07/06/15 2056 2     Pain Loc --      Pain Edu? --      Excl. in [place]? --     Constitutional: Alert and oriented. Well appearing and in no acute distress. Eyes: Conjunctivae are normal. PERRL. EOMI. Head: Atraumatic. Nose: No congestion/rhinnorhea. Mouth/Throat: Mucous membranes are moist.  Oropharynx non-erythematous. Neck: No stridor.   Hematological/Lymphatic/Immunilogical: No cervical lymphadenopathy. Cardiovascular: Normal rate, regular rhythm. Grossly normal heart sounds.  Good peripheral circulation. Respiratory: Normal respiratory effort.  No retractions. Lungs are mostly clear to auscultation. Minor/mental crackling noted in left base. Good air entry into the bases. No wheezing, rales, rhonchi are noted. Gastrointestinal: Soft and nontender. No distention. No abdominal bruits. No CVA  tenderness. Musculoskeletal: No lower extremity  tenderness nor edema.  No joint effusions. Neurologic:  Normal speech and language. No gross focal neurologic deficits are appreciated. No gait instability. Skin:  Skin is warm, dry and intact. No rash noted. Psychiatric: Mood and affect are normal. Speech and behavior are normal.  ____________________________________________   LABS (all labs ordered are listed, but only abnormal results are displayed)  Labs Reviewed - No data to display ____________________________________________  EKG   ____________________________________________  RADIOLOGY  Chest x-ray Impression: No acute abnormality.  Images were personally reviewed by myself. ____________________________________________   PROCEDURES  Procedure(s) performed: None  Critical Care performed: No  ____________________________________________   INITIAL IMPRESSION / ASSESSMENT AND PLAN / ED COURSE  Pertinent labs & imaging results that were available during my care of the patient were reviewed by me and considered in my medical decision making (see chart for details).  Patient's diagnosis is consistent with Mycoplasma pneumonia. I advised patient her findings and diagnosis and she verbalizes understanding of same. The patient will be placed on azithromycin, prednisone taper, albuterol, Tessalon Perles for symptom control. Patient may take Tylenol and ibuprofen at home for additional symptom control. ____________________________________________   FINAL CLINICAL IMPRESSION(S) / ED DIAGNOSES  Final diagnoses:  Mycoplasma pneumonia      Darletta Moll, PA-C 07/06/15 2150  Wandra Arthurs, MD 07/06/15 2227

## 2015-07-10 DIAGNOSIS — R05 Cough: Secondary | ICD-10-CM | POA: Diagnosis not present

## 2015-07-10 DIAGNOSIS — D72829 Elevated white blood cell count, unspecified: Secondary | ICD-10-CM | POA: Diagnosis not present

## 2015-07-10 DIAGNOSIS — R001 Bradycardia, unspecified: Secondary | ICD-10-CM | POA: Diagnosis not present

## 2015-07-10 DIAGNOSIS — Z7982 Long term (current) use of aspirin: Secondary | ICD-10-CM | POA: Diagnosis not present

## 2015-07-10 DIAGNOSIS — R091 Pleurisy: Secondary | ICD-10-CM | POA: Diagnosis not present

## 2015-07-10 DIAGNOSIS — J441 Chronic obstructive pulmonary disease with (acute) exacerbation: Secondary | ICD-10-CM | POA: Diagnosis not present

## 2015-07-10 DIAGNOSIS — Z888 Allergy status to other drugs, medicaments and biological substances status: Secondary | ICD-10-CM | POA: Diagnosis not present

## 2015-07-10 DIAGNOSIS — R1013 Epigastric pain: Secondary | ICD-10-CM | POA: Diagnosis not present

## 2015-07-10 DIAGNOSIS — I2510 Atherosclerotic heart disease of native coronary artery without angina pectoris: Secondary | ICD-10-CM | POA: Diagnosis not present

## 2015-07-10 DIAGNOSIS — F17210 Nicotine dependence, cigarettes, uncomplicated: Secondary | ICD-10-CM | POA: Diagnosis not present

## 2015-07-10 DIAGNOSIS — I252 Old myocardial infarction: Secondary | ICD-10-CM | POA: Diagnosis not present

## 2015-07-10 DIAGNOSIS — R079 Chest pain, unspecified: Secondary | ICD-10-CM | POA: Diagnosis not present

## 2015-07-10 DIAGNOSIS — Z79899 Other long term (current) drug therapy: Secondary | ICD-10-CM | POA: Diagnosis not present

## 2015-07-10 DIAGNOSIS — I10 Essential (primary) hypertension: Secondary | ICD-10-CM | POA: Diagnosis not present

## 2015-07-10 DIAGNOSIS — M8580 Other specified disorders of bone density and structure, unspecified site: Secondary | ICD-10-CM | POA: Diagnosis not present

## 2015-07-13 DIAGNOSIS — J209 Acute bronchitis, unspecified: Secondary | ICD-10-CM | POA: Diagnosis not present

## 2015-07-13 DIAGNOSIS — J40 Bronchitis, not specified as acute or chronic: Secondary | ICD-10-CM | POA: Diagnosis not present

## 2015-07-13 DIAGNOSIS — G5601 Carpal tunnel syndrome, right upper limb: Secondary | ICD-10-CM | POA: Diagnosis not present

## 2015-07-13 DIAGNOSIS — I2510 Atherosclerotic heart disease of native coronary artery without angina pectoris: Secondary | ICD-10-CM | POA: Diagnosis not present

## 2015-07-20 DIAGNOSIS — I739 Peripheral vascular disease, unspecified: Secondary | ICD-10-CM | POA: Diagnosis not present

## 2015-07-27 DIAGNOSIS — K219 Gastro-esophageal reflux disease without esophagitis: Secondary | ICD-10-CM | POA: Diagnosis not present

## 2015-07-27 DIAGNOSIS — I7389 Other specified peripheral vascular diseases: Secondary | ICD-10-CM | POA: Diagnosis not present

## 2015-09-27 DIAGNOSIS — I119 Hypertensive heart disease without heart failure: Secondary | ICD-10-CM | POA: Diagnosis not present

## 2015-09-27 DIAGNOSIS — M659 Synovitis and tenosynovitis, unspecified: Secondary | ICD-10-CM | POA: Diagnosis not present

## 2015-09-27 DIAGNOSIS — I2510 Atherosclerotic heart disease of native coronary artery without angina pectoris: Secondary | ICD-10-CM | POA: Diagnosis not present

## 2015-10-01 DIAGNOSIS — I739 Peripheral vascular disease, unspecified: Secondary | ICD-10-CM | POA: Diagnosis not present

## 2015-10-01 DIAGNOSIS — I10 Essential (primary) hypertension: Secondary | ICD-10-CM | POA: Diagnosis not present

## 2015-10-01 DIAGNOSIS — K219 Gastro-esophageal reflux disease without esophagitis: Secondary | ICD-10-CM | POA: Diagnosis not present

## 2015-10-01 DIAGNOSIS — I2510 Atherosclerotic heart disease of native coronary artery without angina pectoris: Secondary | ICD-10-CM | POA: Diagnosis not present

## 2015-10-07 ENCOUNTER — Ambulatory Visit: Payer: Medicare Other

## 2015-10-07 ENCOUNTER — Ambulatory Visit: Payer: Medicare Other | Admitting: Cardiothoracic Surgery

## 2015-10-14 ENCOUNTER — Ambulatory Visit
Admission: RE | Admit: 2015-10-14 | Discharge: 2015-10-14 | Disposition: A | Discharge status: Home / Self Care | Payer: Medicare Other | Source: Ambulatory Visit | Attending: Cardiothoracic Surgery | Admitting: Cardiothoracic Surgery

## 2015-10-14 ENCOUNTER — Inpatient Hospital Stay: Payer: Medicare Other | Attending: Cardiothoracic Surgery | Admitting: Cardiothoracic Surgery

## 2015-10-14 DIAGNOSIS — R590 Localized enlarged lymph nodes: Secondary | ICD-10-CM | POA: Diagnosis not present

## 2015-10-14 DIAGNOSIS — I700 Atherosclerosis of aorta: Secondary | ICD-10-CM | POA: Diagnosis not present

## 2015-10-14 DIAGNOSIS — I2510 Atherosclerotic heart disease of native coronary artery without angina pectoris: Secondary | ICD-10-CM | POA: Diagnosis not present

## 2015-10-14 DIAGNOSIS — R911 Solitary pulmonary nodule: Secondary | ICD-10-CM

## 2015-10-14 DIAGNOSIS — R918 Other nonspecific abnormal finding of lung field: Secondary | ICD-10-CM | POA: Insufficient documentation

## 2015-10-28 ENCOUNTER — Inpatient Hospital Stay: Payer: Medicare Other | Attending: Cardiothoracic Surgery | Admitting: Cardiothoracic Surgery

## 2015-11-10 ENCOUNTER — Other Ambulatory Visit: Payer: Self-pay

## 2015-11-11 ENCOUNTER — Inpatient Hospital Stay: Payer: Medicare Other | Admitting: Cardiothoracic Surgery

## 2015-11-12 ENCOUNTER — Encounter: Payer: Self-pay | Admitting: Cardiothoracic Surgery

## 2015-11-12 ENCOUNTER — Ambulatory Visit (INDEPENDENT_AMBULATORY_CARE_PROVIDER_SITE_OTHER): Payer: Medicare Other | Admitting: Cardiothoracic Surgery

## 2015-11-12 VITALS — BP 178/90 | HR 59 | Temp 98.3°F | Wt 162.0 lb

## 2015-11-12 DIAGNOSIS — R911 Solitary pulmonary nodule: Secondary | ICD-10-CM

## 2015-11-12 NOTE — Patient Instructions (Signed)
Please go and see your primary care doctor to follow up with your lungs.

## 2015-11-12 NOTE — Progress Notes (Signed)
Karen [name] Inpatient Post-Op Note  Patient ID: Karen [name], female   DOB: 1946/01/19, 70 y.o.   MRN: [id number]  HISTORY: She returns today in follow-up. She did have a CT scan done several weeks ago. She states that she does continue to smoke at least a 12:45 half pack per day but is trying to quit. She does get short of breath with exertion.   Filed Vitals:   11/12/15 0845  BP: 178/90  Pulse: 59  Temp: 98.3 F (36.8 C)     EXAM: Resp: Lungs are clear bilaterally.  No respiratory distress, normal effort. Heart:  Regular without murmurs Abd:  Abdomen is soft, non distended and non tender. No masses are palpable.  There is no rebound and no guarding.  Neurological: Alert and oriented to person, place, and time. Coordination normal.  Skin: Skin is warm and dry. No rash noted. No diaphoretic. No erythema. No pallor.  Psychiatric: Normal mood and affect. Normal behavior. Judgment and thought content normal.    ASSESSMENT: I have independently reviewed the patient's CT scan. There are multiple bilateral pulmonary nodules that are unchanged.   PLAN:   I told her that the appearance on the CT scan was suggested benign process and no further follow-up was recommended. I did encourage her to continue her follow-up with her primary care physician. She states she has another appointment in July. She does get annual mammograms and her last one was reportedly negative.    Nestor Lewandowsky, MD

## 2015-11-23 ENCOUNTER — Encounter: Payer: Self-pay | Admitting: Obstetrics and Gynecology

## 2015-11-23 ENCOUNTER — Ambulatory Visit (INDEPENDENT_AMBULATORY_CARE_PROVIDER_SITE_OTHER): Payer: Medicare Other | Admitting: Obstetrics and Gynecology

## 2015-11-23 VITALS — BP 193/75 | HR 68 | Ht 63.0 in | Wt 163.7 lb

## 2015-11-23 DIAGNOSIS — N949 Unspecified condition associated with female genital organs and menstrual cycle: Secondary | ICD-10-CM | POA: Diagnosis not present

## 2015-11-23 DIAGNOSIS — N9489 Other specified conditions associated with female genital organs and menstrual cycle: Secondary | ICD-10-CM

## 2015-11-23 DIAGNOSIS — N930 Postcoital and contact bleeding: Secondary | ICD-10-CM | POA: Diagnosis not present

## 2015-11-23 DIAGNOSIS — Z90710 Acquired absence of both cervix and uterus: Secondary | ICD-10-CM | POA: Diagnosis not present

## 2015-11-23 DIAGNOSIS — R102 Pelvic and perineal pain: Secondary | ICD-10-CM

## 2015-11-23 DIAGNOSIS — N644 Mastodynia: Secondary | ICD-10-CM | POA: Diagnosis not present

## 2015-11-23 DIAGNOSIS — N9089 Other specified noninflammatory disorders of vulva and perineum: Secondary | ICD-10-CM

## 2015-11-23 DIAGNOSIS — Z1211 Encounter for screening for malignant neoplasm of colon: Secondary | ICD-10-CM | POA: Diagnosis not present

## 2015-11-23 DIAGNOSIS — Z87440 Personal history of urinary (tract) infections: Secondary | ICD-10-CM

## 2015-11-23 DIAGNOSIS — Z202 Contact with and (suspected) exposure to infections with a predominantly sexual mode of transmission: Secondary | ICD-10-CM

## 2015-11-23 DIAGNOSIS — N952 Postmenopausal atrophic vaginitis: Secondary | ICD-10-CM | POA: Diagnosis not present

## 2015-11-23 DIAGNOSIS — N898 Other specified noninflammatory disorders of vagina: Secondary | ICD-10-CM

## 2015-11-23 DIAGNOSIS — N941 Unspecified dyspareunia: Secondary | ICD-10-CM

## 2015-11-23 MED ORDER — ESTROGENS, CONJUGATED 0.625 MG/GM VA CREA: 0.5000 | TOPICAL_CREAM | Freq: Every day | VAGINAL | Status: DC

## 2015-11-23 NOTE — Progress Notes (Deleted)
]      ROS ] 

## 2015-11-23 NOTE — Progress Notes (Signed)
GYN ENCOUNTER NOTE  Subjective:       Karen [name] is a 70 y.o. [id number] female is here for gynecologic evaluation of the following issues:  1. Bump on inner labia on the right. No pain or itching. Has been present 3-4 yrs. Possible hx of genital herpes exposure. 2. Dyspareunia with penetration and deep thrusting. Attributes to vaginal dryness. Has not tried lubrication 3. Post coital bleeding - spotting. -HIV screen, unsure of other STI exposure. Not married, but has 1 partner.  4. Vaginal odor that is present at times. 5. Breast tenderness on lateral right breast. Normal mammo in last 12 mos. Drinks excessive coffee and soda.  6. Recurrent UTIs. 1-2 per year, none recently. Not associated with intercourse.   Gynecologic History No LMP recorded. Patient has had a hysterectomy. Contraception: status post hysterectomy Last mammogram: 2016. Results were: normal  Obstetric History OB History  Gravida Para Term Preterm AB SAB TAB Ectopic Multiple Living  '8 5 5  3 3    5    '$ # Outcome Date GA Lbr Len/2nd Weight Sex Delivery Anes PTL Lv  8 Term 1974    M Vag-Spont   Y  7 Term 1973    F Vag-Spont   Y  6 Term 1969    F Vag-Spont   Y  5 Term 1967    F Vag-Spont   Y  4 Term 1965    M Vag-Spont   Y  3 SAB           2 SAB           1 SAB               Past Medical History  Diagnosis Date  . Hypertension   . Hypercholesteremia   . Myocardial infarction ([place])   . Anxiety   . Depression   . Hyperlipemia   . Back pain     Past Surgical History  Procedure Laterality Date  . Tubal ligation    . Coronary angioplasty with stent placement    . Vaginal hysterectomy    . Dilation and curettage of uterus      Current Outpatient Prescriptions on File Prior to Visit  Medication Sig Dispense Refill  . albuterol (PROVENTIL HFA;VENTOLIN HFA) 108 (90 BASE) MCG/ACT inhaler Inhale 2 puffs into the lungs every 4 (four) hours as needed for wheezing or shortness of breath. 1 Inhaler 0  .  ALPRAZolam (XANAX) 0.25 MG tablet Take 0.25 mg by mouth at bedtime as needed for anxiety.    Marland Kitchen amLODipine (NORVASC) 10 MG tablet Take 10 mg by mouth daily.    Marland Kitchen aspirin EC 81 MG tablet Take 1 tablet by mouth daily. Last taken yesterday    . clopidogrel (PLAVIX) 75 MG tablet Take 1 tablet by mouth daily.    Marland Kitchen dexlansoprazole (DEXILANT) 60 MG capsule Take 1 capsule by mouth daily.    . DULoxetine (CYMBALTA) 20 MG capsule Take 20 mg by mouth daily.    . furosemide (LASIX) 20 MG tablet Take 1 tablet by mouth daily.    Marland Kitchen gabapentin (NEURONTIN) 400 MG capsule Take 1 capsule by mouth daily.    Marland Kitchen lisinopril (PRINIVIL,ZESTRIL) 10 MG tablet Take 10 mg by mouth daily.    . metoprolol succinate (TOPROL-XL) 100 MG 24 hr tablet Take 100 mg by mouth daily.    . nitroGLYCERIN (NITROSTAT) 0.4 MG SL tablet Place 1 tablet under the tongue every 5 (five) minutes as needed. Chest  pain. May take up to 3 doses. On third dose call 911    . potassium chloride (MICRO-K) 10 MEQ CR capsule Take 1 capsule by mouth daily.    . simvastatin (ZOCOR) 40 MG tablet Take 1 tablet by mouth daily.    . traMADol (ULTRAM) 50 MG tablet Take 1 tablet (50 mg total) by mouth every 6 (six) hours as needed. 12 tablet 0   No current facility-administered medications on file prior to visit.    Allergies  Allergen Reactions  . Celebrex [Celecoxib] Itching  . Requip [Ropinirole Hcl] Other (See Comments)    Throat closing  . Macrobid [Nitrofurantoin Monohyd Macro] Palpitations  . Nitrofurantoin Palpitations    Social History   Social History  . Marital Status: Single    Spouse Name: N/A  . Number of Children: N/A  . Years of Education: N/A   Occupational History  . Not on file.   Social History Main Topics  . Smoking status: Current Every Day Smoker -- 0.50 packs/day for 40 years    Types: Cigarettes  . Smokeless tobacco: Current User    Types: Snuff, Chew  . Alcohol Use: No  . Drug Use: No  . Sexual Activity:     Partners: Male   Other Topics Concern  . Not on file   Social History Narrative    Family History  Problem Relation Age of Onset  . Heart disease Father   . Stroke Mother   . Hypertension Mother   . Lung cancer Brother   . Heart attack Brother   . Congestive Heart Failure Sister   . Cervical cancer Sister   . Cancer Neg Hx   . Diabetes Neg Hx     The following portions of the patient's history were reviewed and updated as appropriate: allergies, current medications, past family history, past medical history, past social history, past surgical history and problem list.  Review of Systems Review of Systems - Negative except as mentioned in HPI Review of Systems - General ROS: negative for - chills, fatigue, fever, hot flashes, malaise or night sweats Hematological and Lymphatic ROS: negative for - bleeding problems or swollen lymph nodes Gastrointestinal ROS: negative for - abdominal pain, blood in stools, change in bowel habits and nausea/vomiting Musculoskeletal ROS: negative for - joint pain, muscle pain or muscular weakness Genito-Urinary ROS: negative for - change in menstrual cycle, dysmenorrhea, dyspareunia, dysuria, genital discharge, genital ulcers, hematuria, incontinence, nocturia or pelvic pain  Objective:   BP 193/75 mmHg  Pulse 68  Ht '5\' 3"'$  (1.6 m)  Wt 163 lb 11.2 oz (74.254 kg)  BMI 29.01 kg/m2 CONSTITUTIONAL: Well-developed, overweight female in no acute distress.  HENT:  Normocephalic, atraumatic.  NECK: Normal range of motion, supple, no masses.  Normal thyroid.  SKIN: Skin is warm and dry. No rash noted. Not diaphoretic. No erythema. No pallor. [place]: Alert and oriented to person, place, and time. PSYCHIATRIC: Normal mood and affect. Normal behavior. Normal judgment and thought content. CARDIOVASCULAR:Not Examined RESPIRATORY: Not Examined BREASTS: diffuse fibrocystic changes, no palpable obvious mass, point tenderness at 8'o clock right  breast ABDOMEN: Soft, non distended; LLQ pain on palpation.  No Organomegaly. PELVIC:  External Genitalia: mobile and nontender mass on right labia majora-3 x 4 cm (? Lipoma)  BUS: Normal  Vagina: mildly atrophic  Cervix: surgically absent  Uterus:surgically absent  Adnexa: LLQ pain on palpation  RV: Normal   Bladder: Nontender MUSCULOSKELETAL: Normal range of motion. No tenderness.  No cyanosis, clubbing,  or edema.     Assessment:   1. Screening for colon cancer - Fecal occult blood, imunochemical  2. Breast tenderness  - lateral aspect of right breast. Excessive caffeine intake.   3. Dyspareunia in female  4. Vaginal atrophy  5. History of UTI - 1-2 annually, but none recently.   6. Potential exposure to STD - possible hx of genital herpes diagnosis - unsure of other exposure - recent HIV screen negative  7. Vaginal odor - Not present on exam.  8. History of total vaginal hysterectomy (TVH)  9. Postcoital bleeding - spotting with intercourse  10. Vulvar lump - right labia. Possible inclusion cyst or lipoma.  11. LLQ tenderness on bimanual exam - with bimanual exam   Plan:   1. Ordered diagnostic mammogram for right sided breast tenderness. Also discussed realistic reduction of caffeine usage by 50%.  2. Vaginal estrogen cream to be applied by patient 2x weekly. Instructed on application in office. 3. Discussed using virgin olive oil, KY jelly, astroglide or other lubricants with intercourse. 4. Pelvic US to assess LLQ pain on bimanual exam 5. Plan to remove vulvar lump in the OR. Will need to see for pre-op 1 week prior. 6. Screen for HSV1, HSV2, and syphilis  7. Come in and give urine specimen if UTI symptoms recur. 8. Follow up 1 week after [place].  Claiborne Billings Rayburn PA-S Brayton Mars, MD   I have seen, interviewed, and examined the patient in conjunction with the [email or web address].A. student and affirm the diagnosis and management plan. Lexie Koehl A.  Shown Dissinger, MD, FACOG   Note: This dictation was prepared with Dragon dictation along with smaller phrase technology. Any transcriptional errors that result from this process are unintentional.

## 2015-11-23 NOTE — Patient Instructions (Signed)
1. Pelvic ultrasound 2. Return in 1 week after ultrasound for further management planning 3. Surgery to be scheduled today for excision of vulvar lesion 4. Return for preop appointment the week before surgery 5. Start estrogen cream therapy 1/2 g twice a week in the vagina 6. Consider using lubricants:  Virgin olive oil  K-Y jelly  Astroglide  JO H20 lubricant 7. Diagnostic mammogram 8. Decrease 50% caffeine intake

## 2015-11-23 NOTE — Progress Notes (Deleted)
Past Medical History  Diagnosis Date  . Hypertension   . Hypercholesteremia   . Myocardial infarction ([place])   . Anxiety   . Depression   . Hyperlipemia   . Back pain     Past Surgical History  Procedure Laterality Date  . Tubal ligation    . Coronary angioplasty with stent placement    . Vaginal hysterectomy    . Dilation and curettage of uterus      Current Outpatient Prescriptions on File Prior to Visit  Medication Sig Dispense Refill  . albuterol (PROVENTIL HFA;VENTOLIN HFA) 108 (90 BASE) MCG/ACT inhaler Inhale 2 puffs into the lungs every 4 (four) hours as needed for wheezing or shortness of breath. 1 Inhaler 0  . ALPRAZolam (XANAX) 0.25 MG tablet Take 0.25 mg by mouth at bedtime as needed for anxiety.    Marland Kitchen amLODipine (NORVASC) 10 MG tablet Take 10 mg by mouth daily.    Marland Kitchen aspirin EC 81 MG tablet Take 1 tablet by mouth daily. Last taken yesterday    . clopidogrel (PLAVIX) 75 MG tablet Take 1 tablet by mouth daily.    Marland Kitchen dexlansoprazole (DEXILANT) 60 MG capsule Take 1 capsule by mouth daily.    . DULoxetine (CYMBALTA) 20 MG capsule Take 20 mg by mouth daily.    . furosemide (LASIX) 20 MG tablet Take 1 tablet by mouth daily.    Marland Kitchen gabapentin (NEURONTIN) 400 MG capsule Take 1 capsule by mouth daily.    Marland Kitchen lisinopril (PRINIVIL,ZESTRIL) 10 MG tablet Take 10 mg by mouth daily.    . metoprolol succinate (TOPROL-XL) 100 MG 24 hr tablet Take 100 mg by mouth daily.    . nitroGLYCERIN (NITROSTAT) 0.4 MG SL tablet Place 1 tablet under the tongue every 5 (five) minutes as needed. Chest pain. May take up to 3 doses. On third dose call 911    . potassium chloride (MICRO-K) 10 MEQ CR capsule Take 1 capsule by mouth daily.    . simvastatin (ZOCOR) 40 MG tablet Take 1 tablet by mouth daily.    . traMADol (ULTRAM) 50 MG tablet Take 1 tablet (50 mg total) by mouth every 6 (six) hours as needed. 12 tablet 0   No current facility-administered medications on file prior to visit.    Allergies   Allergen Reactions  . Celebrex [Celecoxib] Itching  . Requip [Ropinirole Hcl] Other (See Comments)    Throat closing  . Macrobid [Nitrofurantoin Monohyd Macro] Palpitations  . Nitrofurantoin Palpitations    Social History   Social History  . Marital Status: Single    Spouse Name: N/A  . Number of Children: N/A  . Years of Education: N/A   Occupational History  . Not on file.   Social History Main Topics  . Smoking status: Current Every Day Smoker -- 0.50 packs/day for 40 years    Types: Cigarettes  . Smokeless tobacco: Current User    Types: Snuff, Chew  . Alcohol Use: No  . Drug Use: No  . Sexual Activity:    Partners: Male   Other Topics Concern  . Not on file   Social History Narrative    Family History  Problem Relation Age of Onset  . Heart disease Father   . Stroke Mother   . Hypertension Mother   . Lung cancer Brother   . Heart attack Brother   . Congestive Heart Failure Sister   . Cervical cancer Sister   . Cancer Neg Hx   . Diabetes Neg  Hx     The following portions of the patient's history were reviewed and updated as appropriate: allergies, current medications, past family history, past medical history, past social history, past surgical history and problem list.  Review of Systems ROS Review of Systems - General ROS: negative for - chills, fatigue, fever, hot flashes, night sweats, weight gain or weight loss Psychological ROS: negative for - anxiety, decreased libido, depression, mood swings, physical abuse or sexual abuse Ophthalmic ROS: negative for - blurry vision, eye pain or loss of vision ENT ROS: negative for - headaches, hearing change, visual changes or vocal changes Allergy and Immunology ROS: negative for - hives, itchy/watery eyes or seasonal allergies Hematological and Lymphatic ROS: negative for - bleeding problems, bruising, swollen lymph nodes or weight loss Endocrine ROS: negative for - galactorrhea, hair pattern changes, hot  flashes, malaise/lethargy, mood swings, palpitations, polydipsia/polyuria, skin changes, temperature intolerance or unexpected weight changes Breast ROS: negative for - new or changing breast lumps or nipple discharge Respiratory ROS: negative for - cough or shortness of breath Cardiovascular ROS: negative for - chest pain, irregular heartbeat, palpitations or shortness of breath Gastrointestinal ROS: no abdominal pain, change in bowel habits, or black or bloody stools Genito-Urinary ROS: no dysuria, trouble voiding, or hematuria Musculoskeletal ROS: negative for - joint pain or joint stiffness Neurological ROS: negative for - bowel and bladder control changes Dermatological ROS: negative for rash and skin lesion changes   Objective:   BP 193/75 mmHg  Pulse 68  Ht '5\' 3"'$  (1.6 m)  Wt 163 lb 11.2 oz (74.254 kg)  BMI 29.01 kg/m2 CONSTITUTIONAL: Well-developed, well-nourished female in no acute distress.  PSYCHIATRIC: Normal mood and affect. Normal behavior. Normal judgment and thought content. [place]: Alert and oriented to person, place, and time. Normal muscle tone coordination. No cranial nerve deficit noted. HENT:  Normocephalic, atraumatic, External right and left ear normal. Oropharynx is clear and moist EYES: Conjunctivae and EOM are normal. Pupils are equal, round, and reactive to light. No scleral icterus.  NECK: Normal range of motion, supple, no masses.  Normal thyroid.  SKIN: Skin is warm and dry. No rash noted. Not diaphoretic. No erythema. No pallor. CARDIOVASCULAR: Normal heart rate noted, regular rhythm, no murmur. RESPIRATORY: Clear to auscultation bilaterally. Effort and breath sounds normal, no problems with respiration noted. BREASTS: Symmetric in size. No masses, skin changes, nipple drainage, or lymphadenopathy. ABDOMEN: Soft, normal bowel sounds, no distention noted.  No tenderness, rebound or guarding.  BLADDER: Normal PELVIC:  External Genitalia: Normal  BUS:  Normal  Vagina: Normal  Cervix: Normal  Uterus: Normal  Adnexa: Normal  RV: {Blank multiple:19196::"External Exam NormaI","No Rectal Masses","Normal Sphincter tone"}  MUSCULOSKELETAL: Normal range of motion. No tenderness.  No cyanosis, clubbing, or edema.  2+ distal pulses. LYMPHATIC: No Axillary, Supraclavicular, or Inguinal Adenopathy.    Assessment:   Annual gynecologic examination 70 y.o. Contraception: status post hysterectomy [id number] Problem List Items Addressed This Visit    None      Plan:  Pap: ? Mammogram: thru pcp Stool Guaiac Testing:  Ordered Labs: thru pcp Routine preventative health maintenance measures emphasized: {Blank multiple:19196::"Exercise/Diet/Weight control","Tobacco Warnings","Alcohol/Substance use risks","Stress Management","Peer Pressure Issues","Safe Sex"} *** Return to [facility], [place]

## 2015-11-24 LAB — RPR: RPR Ser Ql: NONREACTIVE

## 2015-11-24 LAB — HSV(HERPES SIMPLEX VRS) I + II AB-IGG
HSV 1 Glycoprotein G Ab, IgG: 44.7 index — ABNORMAL HIGH (ref 0.00–0.90)
HSV 2 Glycoprotein G Ab, IgG: 11.1 index — ABNORMAL HIGH (ref 0.00–0.90)

## 2015-11-25 ENCOUNTER — Telehealth: Payer: Self-pay | Admitting: *Deleted

## 2015-11-25 DIAGNOSIS — N952 Postmenopausal atrophic vaginitis: Secondary | ICD-10-CM | POA: Insufficient documentation

## 2015-11-25 DIAGNOSIS — N898 Other specified noninflammatory disorders of vagina: Secondary | ICD-10-CM | POA: Insufficient documentation

## 2015-11-25 DIAGNOSIS — N644 Mastodynia: Secondary | ICD-10-CM | POA: Insufficient documentation

## 2015-11-25 DIAGNOSIS — Z87440 Personal history of urinary (tract) infections: Secondary | ICD-10-CM | POA: Insufficient documentation

## 2015-11-25 DIAGNOSIS — Z202 Contact with and (suspected) exposure to infections with a predominantly sexual mode of transmission: Secondary | ICD-10-CM | POA: Insufficient documentation

## 2015-11-25 DIAGNOSIS — H43813 Vitreous degeneration, bilateral: Secondary | ICD-10-CM | POA: Diagnosis not present

## 2015-11-25 DIAGNOSIS — N930 Postcoital and contact bleeding: Secondary | ICD-10-CM | POA: Insufficient documentation

## 2015-11-25 DIAGNOSIS — N941 Unspecified dyspareunia: Secondary | ICD-10-CM | POA: Insufficient documentation

## 2015-11-25 DIAGNOSIS — R102 Pelvic and perineal pain: Secondary | ICD-10-CM | POA: Insufficient documentation

## 2015-11-25 DIAGNOSIS — Z90710 Acquired absence of both cervix and uterus: Secondary | ICD-10-CM | POA: Insufficient documentation

## 2015-11-25 DIAGNOSIS — N9089 Other specified noninflammatory disorders of vulva and perineum: Secondary | ICD-10-CM | POA: Insufficient documentation

## 2015-11-25 NOTE — Telephone Encounter (Signed)
Patient stated that the nurse called her and she is returning the nurse call. Patient is requesting a call back. Thanks

## 2015-11-29 ENCOUNTER — Telehealth: Payer: Self-pay | Admitting: *Deleted

## 2015-11-29 DIAGNOSIS — Z08 Encounter for follow-up examination after completed treatment for malignant neoplasm: Secondary | ICD-10-CM | POA: Diagnosis not present

## 2015-11-29 DIAGNOSIS — L570 Actinic keratosis: Secondary | ICD-10-CM | POA: Diagnosis not present

## 2015-11-29 DIAGNOSIS — Z85828 Personal history of other malignant neoplasm of skin: Secondary | ICD-10-CM | POA: Diagnosis not present

## 2015-11-29 MED ORDER — ESTROGENS, CONJUGATED 0.625 MG/GM VA CREA: 0.2500 | TOPICAL_CREAM | Freq: Every day | VAGINAL | Status: DC

## 2015-11-29 NOTE — Telephone Encounter (Signed)
Patient called and stated she wanted to talk to the nurse. Patient didn't mentioned want she wanted to discuss. Patient is requesting a call back. Thanks

## 2015-11-29 NOTE — Telephone Encounter (Signed)
Pt aware of lab results. Had question about WLE surgery. Aware I will send message to [facility].

## 2015-11-29 NOTE — Addendum Note (Signed)
Addended by: Elouise Munroe on: 11/29/2015 03:07 PM   Modules accepted: Orders

## 2015-11-29 NOTE — Addendum Note (Signed)
Addended by: Elouise Munroe on: 11/29/2015 02:05 PM   Modules accepted: Orders

## 2015-11-30 NOTE — Telephone Encounter (Signed)
Pts questions answered.

## 2015-12-01 ENCOUNTER — Ambulatory Visit (INDEPENDENT_AMBULATORY_CARE_PROVIDER_SITE_OTHER): Payer: Medicare Other

## 2015-12-01 DIAGNOSIS — N941 Unspecified dyspareunia: Secondary | ICD-10-CM | POA: Diagnosis not present

## 2015-12-01 DIAGNOSIS — R102 Pelvic and perineal pain: Secondary | ICD-10-CM | POA: Diagnosis not present

## 2015-12-01 DIAGNOSIS — N930 Postcoital and contact bleeding: Secondary | ICD-10-CM | POA: Diagnosis not present

## 2015-12-02 NOTE — Telephone Encounter (Signed)
Patient scheduled and notified.[facility]

## 2015-12-06 ENCOUNTER — Telehealth: Payer: Self-pay | Admitting: Obstetrics and Gynecology

## 2015-12-06 NOTE — Addendum Note (Signed)
Addended by: Elouise Munroe on: 12/06/2015 03:19 PM   Modules accepted: Orders

## 2015-12-06 NOTE — Telephone Encounter (Signed)
Pt said she called [facility] for an appt and there was no order for right breast

## 2015-12-07 NOTE — Telephone Encounter (Signed)
Pt aware dx mammo is ordered. Advised pt to come in at 3:30 on Thursday instead of 4.

## 2015-12-09 ENCOUNTER — Encounter: Payer: Self-pay | Admitting: Obstetrics and Gynecology

## 2015-12-09 ENCOUNTER — Ambulatory Visit (INDEPENDENT_AMBULATORY_CARE_PROVIDER_SITE_OTHER): Payer: Medicare Other | Admitting: Obstetrics and Gynecology

## 2015-12-09 VITALS — BP 165/77 | HR 64 | Ht 63.0 in | Wt 163.3 lb

## 2015-12-09 DIAGNOSIS — Z87440 Personal history of urinary (tract) infections: Secondary | ICD-10-CM

## 2015-12-09 DIAGNOSIS — N949 Unspecified condition associated with female genital organs and menstrual cycle: Secondary | ICD-10-CM

## 2015-12-09 DIAGNOSIS — B009 Herpesviral infection, unspecified: Secondary | ICD-10-CM | POA: Insufficient documentation

## 2015-12-09 DIAGNOSIS — N9489 Other specified conditions associated with female genital organs and menstrual cycle: Secondary | ICD-10-CM | POA: Insufficient documentation

## 2015-12-09 DIAGNOSIS — R1900 Intra-abdominal and pelvic swelling, mass and lump, unspecified site: Secondary | ICD-10-CM | POA: Insufficient documentation

## 2015-12-09 LAB — POCT URINALYSIS DIPSTICK
Bilirubin, UA: NEGATIVE
Blood, UA: NEGATIVE
GLUCOSE UA: NEGATIVE
KETONES UA: NEGATIVE
LEUKOCYTES UA: NEGATIVE
Nitrite, UA: NEGATIVE
PROTEIN UA: NEGATIVE
Spec Grav, UA: 1.015
Urobilinogen, UA: 0.2
pH, UA: 6

## 2015-12-09 MED ORDER — ACYCLOVIR 400 MG PO TABS: 400.0000 mg | ORAL_TABLET | Freq: Two times a day (BID) | ORAL | Status: DC

## 2015-12-09 NOTE — Patient Instructions (Signed)
1. Acyclovir 400 mg twice a day for 7 days for HSV outbreak is prescribed 2. Pelvic ultrasound to reassess left adnexal mass in 2 months is scheduled 3. Return in 1 week after ultrasound for further evaluation and management

## 2015-12-09 NOTE — Progress Notes (Signed)
Chief complaint: 1. Left lower quadrant pain 2. Follow-up on pelvic ultrasound 3. Follow-up on labs-screening for STD  Patient presents for follow-up on above issues. Pelvic pain waxes and wanes and does not occur on a daily basis. Recent pelvic ultrasound demonstrated a complex left adnexal mass (see ultrasound report)  STI screening was negative for syphilis but positive for HSV-1 and HSV-2 IgG.  Ultrasound 12/01/2015 Indications: Post coital bleeding and LLQ pain Findings:  The uterus is surgically absent s/p hysterectomy in her 78's.   Right Ovary is not visualized.  Survey of the right adnexa appears unremarkable. Survey of the left adnexa reveals a complex mass measuring 4.2 x 2.8 x 2.8 cm. Vascular flow was noted on ultrasound.  There is no free fluid in the cul de sac.  Impression: 1. Left adnexal mass. This could be either a complex extra-ovarian mass or it could be her left ovary. The patient is not sure if ovaries were removed at time of hysterectomy.  OBJECTIVE: BP 165/77 mmHg  Pulse 64  Ht '5\' 3"'$  (1.6 m)  Wt 163 lb 4.8 oz (74.072 kg)  BMI 28.93 kg/m2 Physical exam-deferred  IMPRESSION: 1. Lab survey demonstrates findings consistent with prior exposure to HSV 1 and HSV-2. Patient desires acyclovir chemoprophylaxis 2. Left lower quadrant pain, nonacute; ultrasound demonstrates complex cyst left adnexa, possibly consistent with left ovary  PLAN: 1. CA-125 2. Repeat ultrasound in 2 months 3. Return after ultrasound for further evaluation and management planning 4. Acyclovir 400 mg twice a day for 7 days for HSV outbreak  A total of 15 minutes were spent face-to-face with the patient during this encounter and over half of that time dealt with counseling and coordination of care.  Brayton Mars, MD  Note: This dictation was prepared with Dragon dictation along with smaller phrase technology. Any transcriptional errors that result from this process are  unintentional.

## 2015-12-10 LAB — CA 125: CA 125: 17.1 U/mL (ref 0.0–38.1)

## 2015-12-11 LAB — URINE CULTURE: Organism ID, Bacteria: NO GROWTH

## 2015-12-13 ENCOUNTER — Ambulatory Visit: Payer: Self-pay | Admitting: Pain Medicine

## 2015-12-17 ENCOUNTER — Ambulatory Visit
Admission: RE | Admit: 2015-12-17 | Discharge: 2015-12-17 | Disposition: A | Discharge status: Home / Self Care | Payer: Medicare Other | Source: Ambulatory Visit | Attending: Obstetrics and Gynecology | Admitting: Obstetrics and Gynecology

## 2015-12-17 ENCOUNTER — Other Ambulatory Visit: Payer: Self-pay | Admitting: Obstetrics and Gynecology

## 2015-12-17 DIAGNOSIS — N644 Mastodynia: Secondary | ICD-10-CM | POA: Diagnosis not present

## 2015-12-17 DIAGNOSIS — N63 Unspecified lump in breast: Secondary | ICD-10-CM | POA: Diagnosis not present

## 2015-12-21 ENCOUNTER — Encounter: Payer: Medicare Other | Admitting: Obstetrics and Gynecology

## 2015-12-21 ENCOUNTER — Inpatient Hospital Stay: Admission: RE | Admit: 2015-12-21 | Payer: Self-pay | Source: Ambulatory Visit

## 2015-12-27 ENCOUNTER — Ambulatory Visit
Admission: RE | Admit: 2015-12-27 | Payer: Medicare Other | Source: Ambulatory Visit | Admitting: Obstetrics and Gynecology

## 2015-12-27 ENCOUNTER — Encounter: Admission: RE | Payer: Self-pay | Source: Ambulatory Visit

## 2015-12-27 SURGERY — WIDE EXCISION VULVECTOMY
Anesthesia: General | Laterality: Right

## 2016-01-26 ENCOUNTER — Encounter: Payer: Self-pay | Admitting: Obstetrics and Gynecology

## 2016-01-26 ENCOUNTER — Ambulatory Visit (INDEPENDENT_AMBULATORY_CARE_PROVIDER_SITE_OTHER): Payer: Medicare Other | Admitting: Obstetrics and Gynecology

## 2016-01-26 ENCOUNTER — Encounter
Admission: RE | Admit: 2016-01-26 | Discharge: 2016-01-26 | Disposition: A | Discharge status: Home / Self Care | Payer: Medicare Other | Source: Ambulatory Visit | Attending: Obstetrics and Gynecology | Admitting: Obstetrics and Gynecology

## 2016-01-26 VITALS — BP 132/78 | HR 58 | Ht 62.0 in | Wt 161.2 lb

## 2016-01-26 DIAGNOSIS — Z01810 Encounter for preprocedural cardiovascular examination: Secondary | ICD-10-CM | POA: Insufficient documentation

## 2016-01-26 DIAGNOSIS — N949 Unspecified condition associated with female genital organs and menstrual cycle: Secondary | ICD-10-CM

## 2016-01-26 DIAGNOSIS — Z01818 Encounter for other preprocedural examination: Secondary | ICD-10-CM

## 2016-01-26 DIAGNOSIS — Z01812 Encounter for preprocedural laboratory examination: Secondary | ICD-10-CM | POA: Diagnosis not present

## 2016-01-26 DIAGNOSIS — N9089 Other specified noninflammatory disorders of vulva and perineum: Secondary | ICD-10-CM

## 2016-01-26 HISTORY — DX: Restless legs syndrome: G25.81

## 2016-01-26 HISTORY — DX: Sleep apnea, unspecified: G47.30

## 2016-01-26 HISTORY — DX: Malignant (primary) neoplasm, unspecified: C80.1

## 2016-01-26 HISTORY — DX: Gastro-esophageal reflux disease without esophagitis: K21.9

## 2016-01-26 HISTORY — DX: Polyneuropathy, unspecified: G62.9

## 2016-01-26 HISTORY — DX: Bronchitis, not specified as acute or chronic: J40

## 2016-01-26 LAB — BASIC METABOLIC PANEL
Anion gap: 6 (ref 5–15)
BUN: 11 mg/dL (ref 6–20)
CHLORIDE: 104 mmol/L (ref 101–111)
CO2: 30 mmol/L (ref 22–32)
Calcium: 9.9 mg/dL (ref 8.9–10.3)
Creatinine, Ser: 0.99 mg/dL (ref 0.44–1.00)
GFR calc Af Amer: >60 mL/min (ref >60)
GFR calc non Af Amer: 56 mL/min — ABNORMAL LOW (ref >60)
Glucose, Bld: 90 mg/dL (ref 65–99)
POTASSIUM: 3.9 mmol/L (ref 3.5–5.1)
SODIUM: 140 mmol/L (ref 135–145)

## 2016-01-26 LAB — CBC WITH DIFFERENTIAL/PLATELET
Basophils Absolute: 0 10*3/uL (ref 0–0.1)
Basophils Relative: 0 %
EOS PCT: 3 %
Eosinophils Absolute: 0.3 10*3/uL (ref 0–0.7)
HCT: 40.7 % (ref 35.0–47.0)
HEMOGLOBIN: 13.5 g/dL (ref 12.0–16.0)
LYMPHS ABS: 6.1 10*3/uL — AB (ref 1.0–3.6)
Lymphocytes Relative: 62 %
MCH: 31.4 pg (ref 26.0–34.0)
MCHC: 33.3 g/dL (ref 32.0–36.0)
MCV: 94.5 fL (ref 80.0–100.0)
MONOS PCT: 8 %
Monocytes Absolute: 0.8 10*3/uL (ref 0.2–0.9)
NEUTROS PCT: 27 %
Neutro Abs: 2.6 10*3/uL (ref 1.4–6.5)
Platelets: 162 10*3/uL (ref 150–440)
RBC: 4.3 MIL/uL (ref 3.80–5.20)
RDW: 13.5 % (ref 11.5–14.5)
WBC: 9.8 10*3/uL (ref 3.6–11.0)

## 2016-01-26 LAB — RAPID HIV SCREEN (HIV 1/2 AB+AG)
HIV 1/2 Antibodies: NONREACTIVE
HIV-1 P24 Antigen - HIV24: NONREACTIVE

## 2016-01-26 NOTE — Patient Instructions (Signed)
1. Return on 02/08/2016 for postop check

## 2016-01-26 NOTE — H&P (Signed)
Subjective:  PREOPERATIVE HISTORY AND PHYSICAL     Date of surgery: 01/31/2016 Chief complaint: Right labia majora mass   Patient is a 70 y.o. G8P507fmale scheduled for wide local excision of right labia majora mass. Patient has 3-4 cm soft mobile mass in the right labia majora, which is nontender, possibly consistent with a lipoma  Gynecologic History No LMP recorded. Patient has had a hysterectomy. Contraception: status post hysterectomy Last mammogram: 2016. Results were: normal  Obstetric History  Menstrual History: OB History    Gravida Para Term Preterm AB TAB SAB Ectopic Multiple Living   '8 5 5  3  3   5      '$ Menarche age: NA No LMP recorded. Patient has had a hysterectomy.    Past Medical History  Diagnosis Date  . Hypertension   . Hypercholesteremia   . [place] infarction ([place]   . Anxiety   . Depression   . Hyperlipemia   . Back pain     Past Surgical History  Procedure Laterality Date  . Tubal ligation    . Coronary angioplasty with stent placement    . Vaginal hysterectomy    . Dilation and curettage of uterus      OB History  Gravida Para Term Preterm AB SAB TAB Ectopic Multiple Living  '8 5 5  3 3    5    '$ # Outcome Date GA Lbr Len/2nd Weight Sex Delivery Anes PTL Lv  8 Term 1974    M Vag-Spont   Y  7 Term 1973    F Vag-Spont   Y  6 Term 1969    F Vag-Spont   Y  5 Term 1967    F Vag-Spont   Y  4 Term 1965    M Vag-Spont   Y  3 SAB           2 SAB           1 SAB               Social History   Social History  . Marital Status: Single    Spouse Name: N/A  . Number of Children: N/A  . Years of Education: N/A   Social History Main Topics  . Smoking status: Current Every Day Smoker -- 0.50 packs/day for 40 years    Types: Cigarettes  . Smokeless tobacco: Current User    Types: Snuff, Chew  . Alcohol Use: No  . Drug Use: No  . Sexual Activity:    Partners: Male   Other Topics Concern  . Not on file   Social History Narrative     Family History  Problem Relation Age of Onset  . Heart disease Father   . Stroke Mother   . Hypertension Mother   . Lung cancer Brother   . Heart attack Brother   . Congestive Heart Failure Sister   . Cervical cancer Sister   . Cancer Neg Hx   . Diabetes Neg Hx   . Breast cancer Neg Hx      (Not in a hospital admission)  Allergies  Allergen Reactions  . Celebrex [Celecoxib] Itching  . Requip [Ropinirole Hcl] Other (See Comments)    Throat closing  . Macrobid [Nitrofurantoin Monohyd Macro] Palpitations  . Nitrofurantoin Palpitations    Review of Systems Constitutional: No recent fever/chills/sweats Respiratory: No recent cough/bronchitis Cardiovascular: No chest pain Gastrointestinal: No recent nausea/vomiting/diarrhea Genitourinary: No UTI symptoms Hematologic/lymphatic:No history of coagulopathy or recent blood thinner  use    Objective:    There were no vitals taken for this visit.  General:   Normal  Skin:   normal  HEENT:  Normal  Neck:  Supple without Adenopathy or Thyromegaly  Lungs:   Heart:              Breasts:   Abdomen:  Pelvis:  M/S   Extremeties:  Neuro:    clear to auscultation bilaterally   Normal without murmur   Not Examined   soft, non-tender; bowel sounds normal; no masses,  no organomegaly   Exam deferred to OR  No CVAT  Warm/Dry   Normal          Assessment:     Right labia majora mass-3 x 4 cm   Plan:   Wide local excision of right labia majora mass  Preoperative counseling: Patient is to undergo wide local excision of the right labia majora mass measuring 3.4 cm. She is understanding of the planned procedure and is aware of and is accepting of all surgical risks which include but are not limited to bleeding, infection, pelvic organ injury with need for repair, blood clot disorders, anesthesia risks, etc. All questions have been answered. Informed consent is given. Patient is ready and willing to proceed with surgery as  scheduled.  Brayton Mars, MD  Note: This dictation was prepared with Dragon dictation along with smaller phrase technology. Any transcriptional errors that result from this process are unintentional.

## 2016-01-26 NOTE — Pre-Procedure Instructions (Signed)
EKG 2010 IN CARE EVERYWHERE NOTES SEPTAL INFARCT

## 2016-01-26 NOTE — Patient Instructions (Signed)
  Your procedure is scheduled HW:TUUE 10, 2017 (Monday) Report to Same Day Surgery 2nd floor Medical Mall To find out your arrival time please call [phone] between 1PM - 3PM on January 28, 2016 (Friday) Remember: Instructions that are not followed completely may result in serious medical risk, up to and including death, or upon the discretion of your surgeon and anesthesiologist your surgery may need to be rescheduled.    _x___ 1. Do not eat food or drink liquids after midnight. No gum chewing or hard candies.     __x__ 2. No Alcohol for 24 hours before or after surgery.   __x__3. No Smoking for 24 prior to surgery.   ____  4. Bring all medications with you on the day of surgery if instructed.    __x__ 5. Notify your doctor if there is any change in your medical condition     (cold, fever, infections).     Do not wear jewelry, make-up, hairpins, clips or nail polish.  Do not wear lotions, powders, or perfumes. You may wear deodorant.  Do not shave 48 hours prior to surgery. Men may shave face and neck.  Do not bring valuables to the hospital.    [facility] is not responsible for any belongings or valuables.               Contacts, dentures or bridgework may not be worn into surgery.  Leave your suitcase in the car. After surgery it may be brought to your room.  For patients admitted to the hospital, discharge time is determined by your treatment team.   Patients discharged the day of surgery will not be allowed to drive home.    Please read over the following fact sheets that you were given:   [facility] Preparing for Surgery and or MRSA Information   _x___ Take these medicines the morning of surgery with A SIP OF WATER:    1. Amlodipine  2. Dexilant  3. Lisinopril  4. Metoprolol  5. Gabapentin  6.  ____ Fleet Enema (as directed)   _x___ Use CHG Soap or sage wipes as directed on instruction sheet   _x___ Use inhalers on the day of surgery and bring to hospital day of  surgery Use Albuterol inhaler the morning of surgery and bring with you to hospital)  ____ Stop metformin 2 days prior to surgery    ____ Take 1/2 of usual insulin dose the night before surgery and none on the morning of surgery           _x___ Stop aspirin or coumadin, or plavix (Stop Aspirin and plavix  today as instructed by Dr. Enzo Bi per patient )  _x__ Stop Anti-inflammatories such as Advil, Aleve, Ibuprofen, Motrin, Naproxen,          Naprosyn, Goodies powders or aspirin products. Ok to take Tylenol.   ____ Stop supplements until after surgery.    __x__ Bring C-Pap to the hospital.

## 2016-01-26 NOTE — Progress Notes (Signed)
Subjective:  PREOPERATIVE HISTORY AND PHYSICAL     Date of surgery: 01/31/2016 Chief complaint: Right labia majora mass   Patient is a 70 y.o. G8P5072fmale scheduled for wide local excision of right labia majora mass. Patient has 3-4 cm soft mobile mass in the right labia majora, which is nontender, possibly consistent with a lipoma  Gynecologic History No LMP recorded. Patient has had a hysterectomy. Contraception: status post hysterectomy Last mammogram: 2016. Results were: normal  Obstetric History  Menstrual History: OB History    Gravida Para Term Preterm AB TAB SAB Ectopic Multiple Living   '8 5 5  3  3   5      '$ Menarche age: NA No LMP recorded. Patient has had a hysterectomy.    Past Medical History  Diagnosis Date  . Hypertension   . Hypercholesteremia   . [place]   . Anxiety   . Depression   . Hyperlipemia   . Back pain     Past Surgical History  Procedure Laterality Date  . Tubal ligation    . Coronary angioplasty with stent placement    . Vaginal hysterectomy    . Dilation and curettage of uterus      OB History  Gravida Para Term Preterm AB SAB TAB Ectopic Multiple Living  '8 5 5  3 3    5    '$ # Outcome Date GA Lbr Len/2nd Weight Sex Delivery Anes PTL Lv  8 Term 1974    M Vag-Spont   Y  7 Term 1973    F Vag-Spont   Y  6 Term 1969    F Vag-Spont   Y  5 Term 1967    F Vag-Spont   Y  4 Term 1965    M Vag-Spont   Y  3 SAB           2 SAB           1 SAB               Social History   Social History  . Marital Status: Single    Spouse Name: N/A  . Number of Children: N/A  . Years of Education: N/A   Social History Main Topics  . Smoking status: Current Every Day Smoker -- 0.50 packs/day for 40 years    Types: Cigarettes  . Smokeless tobacco: Current User    Types: Snuff, Chew  . Alcohol Use: No  . Drug Use: No  . Sexual Activity:    Partners: Male   Other Topics Concern  . Not on file   Social History Narrative     Family History  Problem Relation Age of Onset  . Heart disease Father   . Stroke Mother   . Hypertension Mother   . Lung cancer Brother   . Heart attack Brother   . Congestive Heart Failure Sister   . Cervical cancer Sister   . Cancer Neg Hx   . Diabetes Neg Hx   . Breast cancer Neg Hx      (Not in a hospital admission)  Allergies  Allergen Reactions  . Celebrex [Celecoxib] Itching  . Requip [Ropinirole Hcl] Other (See Comments)    Throat closing  . Macrobid [Nitrofurantoin Monohyd Macro] Palpitations  . Nitrofurantoin Palpitations    Review of Systems Constitutional: No recent fever/chills/sweats Respiratory: No recent cough/bronchitis Cardiovascular: No chest pain Gastrointestinal: No recent nausea/vomiting/diarrhea Genitourinary: No UTI symptoms Hematologic/lymphatic:No history of coagulopathy or recent blood thinner  use    Objective:    There were no vitals taken for this visit.  General:   Normal  Skin:   normal  HEENT:  Normal  Neck:  Supple without Adenopathy or Thyromegaly  Lungs:   Heart:              Breasts:   Abdomen:  Pelvis:  M/S   Extremeties:  Neuro:    clear to auscultation bilaterally   Normal without murmur   Not Examined   soft, non-tender; bowel sounds normal; no masses,  no organomegaly   Exam deferred to OR  No CVAT  Warm/Dry   Normal          Assessment:     Right labia majora mass-3 x 4 cm   Plan:   Wide local excision of right labia majora mass  Preoperative counseling: Patient is to undergo wide local excision of the right labia majora mass measuring 3.4 cm. She is understanding of the planned procedure and is aware of and is accepting of all surgical risks which include but are not limited to bleeding, infection, pelvic organ injury with need for repair, blood clot disorders, anesthesia risks, etc. All questions have been answered. Informed consent is given. Patient is ready and willing to proceed with surgery as  scheduled.  Brayton Mars, MD  Note: This dictation was prepared with Dragon dictation along with smaller phrase technology. Any transcriptional errors that result from this process are unintentional.

## 2016-01-27 LAB — RPR: RPR Ser Ql: NONREACTIVE

## 2016-01-31 ENCOUNTER — Ambulatory Visit
Admission: RE | Admit: 2016-01-31 | Discharge: 2016-01-31 | Disposition: A | Discharge status: Home / Self Care | Payer: Medicare Other | Source: Ambulatory Visit | Attending: Obstetrics and Gynecology | Admitting: Obstetrics and Gynecology

## 2016-01-31 ENCOUNTER — Encounter
Admission: RE | Disposition: A | Discharge status: Home / Self Care | Payer: Self-pay | Source: Ambulatory Visit | Attending: Obstetrics and Gynecology

## 2016-01-31 ENCOUNTER — Ambulatory Visit: Payer: Medicare Other | Admitting: Anesthesiology

## 2016-01-31 DIAGNOSIS — I252 Old myocardial infarction: Secondary | ICD-10-CM | POA: Diagnosis not present

## 2016-01-31 DIAGNOSIS — N9089 Other specified noninflammatory disorders of vulva and perineum: Secondary | ICD-10-CM

## 2016-01-31 DIAGNOSIS — Z955 Presence of coronary angioplasty implant and graft: Secondary | ICD-10-CM | POA: Insufficient documentation

## 2016-01-31 DIAGNOSIS — K219 Gastro-esophageal reflux disease without esophagitis: Secondary | ICD-10-CM | POA: Diagnosis not present

## 2016-01-31 DIAGNOSIS — F419 Anxiety disorder, unspecified: Secondary | ICD-10-CM | POA: Diagnosis not present

## 2016-01-31 DIAGNOSIS — E785 Hyperlipidemia, unspecified: Secondary | ICD-10-CM | POA: Insufficient documentation

## 2016-01-31 DIAGNOSIS — F329 Major depressive disorder, single episode, unspecified: Secondary | ICD-10-CM | POA: Diagnosis not present

## 2016-01-31 DIAGNOSIS — F17210 Nicotine dependence, cigarettes, uncomplicated: Secondary | ICD-10-CM | POA: Insufficient documentation

## 2016-01-31 DIAGNOSIS — F418 Other specified anxiety disorders: Secondary | ICD-10-CM | POA: Diagnosis not present

## 2016-01-31 DIAGNOSIS — N907 Vulvar cyst: Secondary | ICD-10-CM | POA: Diagnosis not present

## 2016-01-31 DIAGNOSIS — L723 Sebaceous cyst: Secondary | ICD-10-CM | POA: Diagnosis not present

## 2016-01-31 DIAGNOSIS — I10 Essential (primary) hypertension: Secondary | ICD-10-CM | POA: Insufficient documentation

## 2016-01-31 DIAGNOSIS — J449 Chronic obstructive pulmonary disease, unspecified: Secondary | ICD-10-CM | POA: Insufficient documentation

## 2016-01-31 DIAGNOSIS — L720 Epidermal cyst: Secondary | ICD-10-CM | POA: Diagnosis not present

## 2016-01-31 DIAGNOSIS — E7800 Pure hypercholesterolemia, unspecified: Secondary | ICD-10-CM | POA: Diagnosis not present

## 2016-01-31 DIAGNOSIS — G4730 Sleep apnea, unspecified: Secondary | ICD-10-CM | POA: Diagnosis not present

## 2016-01-31 HISTORY — PX: VULVECTOMY: SHX1086

## 2016-01-31 SURGERY — WIDE EXCISION VULVECTOMY
Anesthesia: General | Laterality: Right

## 2016-01-31 MED ORDER — GLYCOPYRROLATE 0.2 MG/ML IJ SOLN
INTRAMUSCULAR | Status: DC | PRN
  Administered 2016-01-31: 0.6 mg via INTRAVENOUS
  Administered 2016-01-31: 0.2 mg via INTRAVENOUS

## 2016-01-31 MED ORDER — LIDOCAINE-EPINEPHRINE 1 %-1:100000 IJ SOLN
INTRAMUSCULAR | Status: AC
  Filled 2016-01-31: qty 1

## 2016-01-31 MED ORDER — PROPOFOL 10 MG/ML IV BOLUS
INTRAVENOUS | Status: DC | PRN
  Administered 2016-01-31: 130 mg via INTRAVENOUS

## 2016-01-31 MED ORDER — MIDAZOLAM HCL 2 MG/2ML IJ SOLN
INTRAMUSCULAR | Status: DC | PRN
  Administered 2016-01-31: 1 mg via INTRAVENOUS

## 2016-01-31 MED ORDER — ROCURONIUM BROMIDE 100 MG/10ML IV SOLN
INTRAVENOUS | Status: DC | PRN
  Administered 2016-01-31: 40 mg via INTRAVENOUS

## 2016-01-31 MED ORDER — LIDOCAINE-EPINEPHRINE 1 %-1:100000 IJ SOLN
INTRAMUSCULAR | Status: DC | PRN
  Administered 2016-01-31: 5 mL

## 2016-01-31 MED ORDER — FENTANYL CITRATE (PF) 100 MCG/2ML IJ SOLN: 25.0000 ug | INTRAMUSCULAR | Status: DC | PRN

## 2016-01-31 MED ORDER — ONDANSETRON HCL 4 MG/2ML IJ SOLN: 4.0000 mg | Freq: Once | INTRAMUSCULAR | Status: DC | PRN

## 2016-01-31 MED ORDER — OXYCODONE-ACETAMINOPHEN 5-325 MG PO TABS: 1.0000 | ORAL_TABLET | ORAL | Status: DC | PRN

## 2016-01-31 MED ORDER — NEOSTIGMINE METHYLSULFATE 10 MG/10ML IV SOLN
INTRAVENOUS | Status: DC | PRN
  Administered 2016-01-31: 4 mg via INTRAVENOUS

## 2016-01-31 MED ORDER — ONDANSETRON HCL 4 MG/2ML IJ SOLN
INTRAMUSCULAR | Status: DC | PRN
  Administered 2016-01-31: 4 mg via INTRAVENOUS

## 2016-01-31 MED ORDER — FENTANYL CITRATE (PF) 100 MCG/2ML IJ SOLN
INTRAMUSCULAR | Status: DC | PRN
  Administered 2016-01-31 (×2): 50 ug via INTRAVENOUS

## 2016-01-31 MED ORDER — LIDOCAINE HCL (CARDIAC) 20 MG/ML IV SOLN
INTRAVENOUS | Status: DC | PRN
  Administered 2016-01-31: 40 mg via INTRAVENOUS

## 2016-01-31 MED ORDER — LACTATED RINGERS IV SOLN
INTRAVENOUS | Status: DC
  Administered 2016-01-31 (×2): via INTRAVENOUS

## 2016-01-31 SURGICAL SUPPLY — 27 items
BLADE SURG 15 STRL LF DISP TIS (BLADE) ×1 IMPLANT
BLADE SURG 15 STRL SS (BLADE) ×2
BLADE SURG SZ10 CARB STEEL (BLADE) ×3 IMPLANT
CANISTER SUCT 1200ML W/VALVE (MISCELLANEOUS) ×3 IMPLANT
CATH ROBINSON RED A/P 16FR (CATHETERS) ×3 IMPLANT
DRAPE PERI LITHO V/GYN (MISCELLANEOUS) ×3 IMPLANT
DRAPE UNDER BUTTOCK W/FLU (DRAPES) ×3 IMPLANT
DRSG TELFA 3X8 NADH (GAUZE/BANDAGES/DRESSINGS) ×3 IMPLANT
ELECT CAUTERY BLADE 6.4 (BLADE) ×3 IMPLANT
ELECT CAUTERY NEEDLE TIP 1.0 (MISCELLANEOUS)
ELECT REM PT RETURN 9FT ADLT (ELECTROSURGICAL) ×3
ELECTRODE CAUTERY NEDL TIP 1.0 (MISCELLANEOUS) IMPLANT
ELECTRODE REM PT RTRN 9FT ADLT (ELECTROSURGICAL) ×1 IMPLANT
GLOVE BIO SURGEON STRL SZ8 (GLOVE) ×3 IMPLANT
GOWN STRL REUS W/ TWL LRG LVL3 (GOWN DISPOSABLE) ×1 IMPLANT
GOWN STRL REUS W/ TWL XL LVL3 (GOWN DISPOSABLE) ×1 IMPLANT
GOWN STRL REUS W/TWL LRG LVL3 (GOWN DISPOSABLE) ×2
GOWN STRL REUS W/TWL XL LVL3 (GOWN DISPOSABLE) ×2
KIT RM TURNOVER CYSTO AR (KITS) ×3 IMPLANT
NEEDLE HYPO 25X1 1.5 SAFETY (NEEDLE) ×3 IMPLANT
NS IRRIG 500ML POUR BTL (IV SOLUTION) ×3 IMPLANT
PACK BASIN MINOR ARMC (MISCELLANEOUS) ×3 IMPLANT
PAD OB MATERNITY 4.3X12.25 (PERSONAL CARE ITEMS) ×3 IMPLANT
PAD PREP 24X41 OB/GYN DISP (PERSONAL CARE ITEMS) ×3 IMPLANT
SPONGE XRAY 4X4 16PLY STRL (MISCELLANEOUS) ×3 IMPLANT
SUT CHROMIC 2 0 SH (SUTURE) ×6 IMPLANT
SYR CONTROL 10ML (SYRINGE) ×3 IMPLANT

## 2016-01-31 NOTE — OR Nursing (Signed)
Dr Enzo Bi made aware that patient bags had live bugs crawling out of them.  Patient bed checked prior to transport to OR none seen in bed.  No new orders

## 2016-01-31 NOTE — Clinical Social Work Note (Signed)
CSW contacted by Dianna in [place] that there was concern that when patient arrived for her outpatient procedure, she had multiple roaches that came out of her bag. Concern was expressed that patient would be going home with a fresh post op wound and would not want any contamination. CSW spoke with patient briefly and she stated that she understood the concern and that she could go stay with her granddaughter who lives in a separate home and does not have any type of bug infestation. CSW spoke with patient's family member that lives with her: Shanon Brow. Shanon Brow was taking patient back home and with patient's permission CSW spoke with Shanon Brow regarding the recommendation by CSW to go live with someone who would possibly be able to provide a more temporary sanitary living situation due to her post op wound. Shanon Brow stated that he did not see an issue with patient going to stay with her granddaughter and would make those arrangements. Shela Leff MSW,LCSW [phone]

## 2016-01-31 NOTE — Transfer of Care (Signed)
Immediate Anesthesia Transfer of Care Note  Patient: Karen [name]  Procedure(s) Performed: Procedure(s): WIDE EXCISION VULVECTOMY-RIGHT LABIA (Right)  Patient Location: PACU  Anesthesia Type:General  Level of Consciousness: awake  Airway & Oxygen Therapy: Patient Spontanous Breathing and Patient connected to face mask oxygen  Post-op Assessment: Report given to RN and Post -op Vital signs reviewed and stable  Post vital signs: Reviewed and stable  Last Vitals:  Filed Vitals:   01/31/16 0833  BP: 144/88  Pulse: 55  Temp: 36.2 C  Resp: 20    Last Pain: There were no vitals filed for this visit.       Complications: No apparent anesthesia complications

## 2016-01-31 NOTE — Anesthesia Preprocedure Evaluation (Addendum)
Anesthesia Evaluation  Patient identified by MRN, date of birth, ID band Patient awake    Reviewed: Allergy & Precautions, NPO status   Airway Mallampati: II       Dental  (+) Edentulous Upper, Partial Lower   Pulmonary sleep apnea , COPD, Current Smoker,    + rhonchi  + decreased breath sounds      Cardiovascular Exercise Tolerance: Poor hypertension, Pt. on home beta blockers + Past MI   Rhythm:Regular Rate:Normal     Neuro/Psych Anxiety Depression    GI/Hepatic Neg liver ROS, GERD  Medicated,  Endo/Other  negative endocrine ROS  Renal/GU negative Renal ROS     Musculoskeletal negative musculoskeletal ROS (+)   Abdominal (+) + obese,   Peds  Hematology negative hematology ROS (+)   Anesthesia Other Findings   Reproductive/Obstetrics                            Anesthesia Physical Anesthesia Plan  ASA: III  Anesthesia Plan: General   Post-op Pain Management:    Induction: Intravenous  Airway Management Planned: LMA and Oral ETT  Additional Equipment:   Intra-op Plan:   Post-operative Plan: Extubation in OR  Informed Consent: I have reviewed the patients History and Physical, chart, labs and discussed the procedure including the risks, benefits and alternatives for the proposed anesthesia with the patient or authorized representative who has indicated his/her understanding and acceptance.     Plan Discussed with: CRNA  Anesthesia Plan Comments:         Anesthesia Quick Evaluation

## 2016-01-31 NOTE — Anesthesia Postprocedure Evaluation (Signed)
Anesthesia Post Note  Patient: Karen [name]  Procedure(s) Performed: Procedure(s) (LRB): WIDE EXCISION VULVECTOMY-RIGHT LABIA (Right)  Patient location during evaluation: PACU Anesthesia Type: General Level of consciousness: awake Pain management: pain level controlled Vital Signs Assessment: post-procedure vital signs reviewed and stable Respiratory status: respiratory function stable Cardiovascular status: blood pressure returned to baseline Anesthetic complications: no    Last Vitals:  Filed Vitals:   01/31/16 0833 01/31/16 1101  BP: 144/88 178/86  Pulse: 55 57  Temp: 36.2 C 36.2 C  Resp: 20 17    Last Pain: There were no vitals filed for this visit.               VAN STAVEREN,Rober Skeels

## 2016-01-31 NOTE — Interval H&P Note (Signed)
History and Physical Interval Note:  01/31/2016 9:46 AM  Karen [name]  has presented today for surgery, with the diagnosis of VULVAR LUMP  The various methods of treatment have been discussed with the patient and family. After consideration of risks, benefits and other options for treatment, the patient has consented to  Procedure(s): WIDE EXCISION VULVECTOMY-RIGHT LABIA (Right) as a surgical intervention .  The patient's history has been reviewed, patient examined, no change in status, stable for surgery.  I have reviewed the patient's chart and labs.  Questions were answered to the patient's satisfaction.     Hassell Done A Kenzel Ruesch

## 2016-01-31 NOTE — H&P (View-Only) (Signed)
Subjective:  PREOPERATIVE HISTORY AND PHYSICAL     Date of surgery: 01/31/2016 Chief complaint: Right labia majora mass   Patient is a 70 y.o. [id number] scheduled for wide local excision of right labia majora mass. Patient has 3-4 cm soft mobile mass in the right labia majora, which is nontender, possibly consistent with a lipoma  Gynecologic History No LMP recorded. Patient has had a hysterectomy. Contraception: status post hysterectomy Last mammogram: 2016. Results were: normal  Obstetric History  Menstrual History: OB History    Gravida Para Term Preterm AB TAB SAB Ectopic Multiple Living   '8 5 5  3  3   5      '$ Menarche age: NA No LMP recorded. Patient has had a hysterectomy.    Past Medical History  Diagnosis Date  . Hypertension   . Hypercholesteremia   [name]   . Anxiety   . Depression   . Hyperlipemia   . Back pain     Past Surgical History  Procedure Laterality Date  . Tubal ligation    . Coronary angioplasty with stent placement    . Vaginal hysterectomy    . Dilation and curettage of uterus      OB History  Gravida Para Term Preterm AB SAB TAB Ectopic Multiple Living  '8 5 5  3 3    5    '$ # Outcome Date GA Lbr Len/2nd Weight Sex Delivery Anes PTL Lv  8 Term 1974    M Vag-Spont   Y  7 Term 1973    F Vag-Spont   Y  6 Term 1969    F Vag-Spont   Y  5 Term 1967    F Vag-Spont   Y  4 Term 1965    M Vag-Spont   Y  3 SAB           2 SAB           1 SAB               Social History   Social History  . Marital Status: Single    Spouse Name: N/A  . Number of Children: N/A  . Years of Education: N/A   Social History Main Topics  . Smoking status: Current Every Day Smoker -- 0.50 packs/day for 40 years    Types: Cigarettes  . Smokeless tobacco: Current User    Types: Snuff, Chew  . Alcohol Use: No  . Drug Use: No  . Sexual Activity:    Partners: Male   Other Topics Concern  . Not on file   Social History Narrative     Family History  Problem Relation Age of Onset  . Heart disease Father   . Stroke Mother   . Hypertension Mother   . Lung cancer Brother   . Heart attack Brother   . Congestive Heart Failure Sister   . Cervical cancer Sister   . Cancer Neg Hx   . Diabetes Neg Hx   . Breast cancer Neg Hx      (Not in a hospital admission)  Allergies  Allergen Reactions  . Celebrex [Celecoxib] Itching  . Requip [Ropinirole Hcl] Other (See Comments)    Throat closing  . Macrobid [Nitrofurantoin Monohyd Macro] Palpitations  . Nitrofurantoin Palpitations    Review of Systems Constitutional: No recent fever/chills/sweats Respiratory: No recent cough/bronchitis Cardiovascular: No chest pain Gastrointestinal: No recent nausea/vomiting/diarrhea Genitourinary: No UTI symptoms Hematologic/lymphatic:No history of coagulopathy or recent blood thinner  use    Objective:    There were no vitals taken for this visit.  General:   Normal  Skin:   normal  HEENT:  Normal  Neck:  Supple without Adenopathy or Thyromegaly  Lungs:   Heart:              Breasts:   Abdomen:  Pelvis:  M/S   Extremeties:  Neuro:    clear to auscultation bilaterally   Normal without murmur   Not Examined   soft, non-tender; bowel sounds normal; no masses,  no organomegaly   Exam deferred to OR  No CVAT  Warm/Dry   Normal          Assessment:     Right labia majora mass-3 x 4 cm   Plan:   Wide local excision of right labia majora mass  Preoperative counseling: Patient is to undergo wide local excision of the right labia majora mass measuring 3.4 cm. She is understanding of the planned procedure and is aware of and is accepting of all surgical risks which include but are not limited to bleeding, infection, pelvic organ injury with need for repair, blood clot disorders, anesthesia risks, etc. All questions have been answered. Informed consent is given. Patient is ready and willing to proceed with surgery as  scheduled.  Brayton Mars, MD  Note: This dictation was prepared with Dragon dictation along with smaller phrase technology. Any transcriptional errors that result from this process are unintentional.

## 2016-01-31 NOTE — Anesthesia Procedure Notes (Signed)
Procedure Name: Intubation Date/Time: 01/31/2016 10:15 AM Performed by: Allean Found Pre-anesthesia Checklist: Patient identified, Emergency Drugs available, Suction available, Patient being monitored and Timeout performed Patient Re-evaluated:Patient Re-evaluated prior to inductionOxygen Delivery Method: Circle system utilized Preoxygenation: Pre-oxygenation with 100% oxygen Intubation Type: IV induction Ventilation: Two handed mask ventilation required Laryngoscope Size: Mac and 3 Grade View: Grade I Tube type: Oral Tube size: 7.0 mm Number of attempts: 1 Airway Equipment and Method: Stylet Placement Confirmation: ETT inserted through vocal cords under direct vision,  positive ETCO2 and breath sounds checked- equal and bilateral Secured at: 20 cm Tube secured with: Tape Dental Injury: Teeth and Oropharynx as per pre-operative assessment  Difficulty Due To: Difficult Airway- due to large tongue

## 2016-01-31 NOTE — OR Nursing (Signed)
Dr Micah Flesher made aware that patient had last does of metoprolol 01/30/2016 @ 1200 pm no new orders at this time

## 2016-01-31 NOTE — Discharge Instructions (Signed)

## 2016-01-31 NOTE — Op Note (Signed)
OPERATIVE NOTE:  Kathern Toni Arthurs PROCEDURE DATE: 01/31/2016   PREOPERATIVE DIAGNOSIS:  1. Right vulvar cyst-4 cm  POSTOPERATIVE DIAGNOSIS:  1. Right vulvar cyst-4 cm  PROCEDURE: Wide local excision of vulvar cyst   SURGEON:  Brayton Mars, MD ASSISTANTS: None ANESTHESIA: General INDICATIONS: 70 y.o. [id number] with 4 cm right labial cyst admitted for excision   FINDINGS:  3 x 1 cm cystic mass excised from right labia majora  I/O's: Total I/O In: -  Out: 155 [Urine:150; Blood:5] COUNTS:  YES SPECIMENS:  1. Right labia majora cystic mass   ANTIBIOTIC PROPHYLAXIS:N/A COMPLICATIONS: None immediate  PROCEDURE IN DETAIL:  Patient was brought to the operating room and she was placed in supine position. General anesthesia was induced without difficulty. She was placed in the dorsal lithotomy position using candycane stirrups. A Betadine perineal intravaginal prep and drape was performed in standard fashion. Timeout was completed. Red Robinson catheter was used to drain 125 mL of urine from the bladder. The superior-inferior aspects of the right labia majora cyst were grasped with Allis clamps. An elliptical incision was made in the skin with scalpel. Needle tipped Bovie cautery was then used to excise the vulvar cyst and optimize hemostasis. A 3 x 1 cm incision was made with the specimen being sent to pathology. Needle tip Bovie cautery was used for hemostasis. The incision was closed in a layered manner using 3-0 Vicryl suture for both subcuticular and skin. Dermabond was placed over the skin. Good hemostasis was obtained. Patient was then awakened mobilized and taken to recovery room in satisfactory condition.  Linzi Ohlinger A. Zipporah Plants, MD, ACOG [facility]

## 2016-02-01 LAB — SURGICAL PATHOLOGY

## 2016-02-03 ENCOUNTER — Other Ambulatory Visit: Payer: Medicare Other

## 2016-02-10 ENCOUNTER — Ambulatory Visit: Payer: Medicare Other | Admitting: Obstetrics and Gynecology

## 2016-02-15 ENCOUNTER — Encounter: Payer: Self-pay | Admitting: Obstetrics and Gynecology

## 2016-02-15 ENCOUNTER — Ambulatory Visit (INDEPENDENT_AMBULATORY_CARE_PROVIDER_SITE_OTHER): Payer: Medicare Other | Admitting: Obstetrics and Gynecology

## 2016-02-15 ENCOUNTER — Other Ambulatory Visit: Payer: Medicare Other

## 2016-02-15 VITALS — BP 128/80 | HR 58 | Ht 63.0 in | Wt 162.5 lb

## 2016-02-15 DIAGNOSIS — Z09 Encounter for follow-up examination after completed treatment for conditions other than malignant neoplasm: Secondary | ICD-10-CM

## 2016-02-15 DIAGNOSIS — N907 Vulvar cyst: Secondary | ICD-10-CM

## 2016-02-15 DIAGNOSIS — B009 Herpesviral infection, unspecified: Secondary | ICD-10-CM

## 2016-02-15 DIAGNOSIS — N949 Unspecified condition associated with female genital organs and menstrual cycle: Secondary | ICD-10-CM

## 2016-02-15 DIAGNOSIS — N9089 Other specified noninflammatory disorders of vulva and perineum: Secondary | ICD-10-CM

## 2016-02-15 MED ORDER — ACYCLOVIR 400 MG PO TABS: ORAL_TABLET | ORAL | 6 refills | Status: DC

## 2016-02-15 NOTE — Progress Notes (Signed)
Chief complaint: 1. One week postop check 2. Status post wide local excision of vulvar mass  Patient presents for follow-up 2 weeks after surgery. She is not experiencing any pain, vulvar drainage or bleeding  PATHOLOGY: DIAGNOSIS:  A. LABIAL CYST, RIGHT; EXCISION:  - FOLLICULAR CYST, INFUNDIBULAR TYPE (EPIDERMAL INCLUSION CYST).   OBJECTIVE: BP 128/80   Pulse (!) 58   Ht '5\' 3"'$  (1.6 m)   Wt 162 lb 8 oz (73.7 kg)   BMI 28.79 kg/m  Pelvic exam: External genitalia-right labia majora biopsy site is healing well without induration or drainage  ASSESSMENT: 1. Normal postop check-2 weeks status post wide local excision of vulvar inclusion cyst 2. History of recurrent genital herpes  PLAN: 1. Resume activities as tolerated 2. Refill of acyclovir for recurrent genital herpes  Brayton Mars, MD  Note: This dictation was prepared with Dragon dictation along with smaller phrase technology. Any transcriptional errors that result from this process are unintentional.

## 2016-02-15 NOTE — Patient Instructions (Signed)
1. Resume all activities as tolerated 2. Refill acyclovir for episode of herpes genitalis

## 2016-02-16 ENCOUNTER — Ambulatory Visit (INDEPENDENT_AMBULATORY_CARE_PROVIDER_SITE_OTHER): Payer: Medicare Other

## 2016-02-16 DIAGNOSIS — Z87440 Personal history of urinary (tract) infections: Secondary | ICD-10-CM | POA: Diagnosis not present

## 2016-02-28 DIAGNOSIS — I2510 Atherosclerotic heart disease of native coronary artery without angina pectoris: Secondary | ICD-10-CM | POA: Diagnosis not present

## 2016-02-28 DIAGNOSIS — K219 Gastro-esophageal reflux disease without esophagitis: Secondary | ICD-10-CM | POA: Diagnosis not present

## 2016-02-28 DIAGNOSIS — S99922A Unspecified injury of left foot, initial encounter: Secondary | ICD-10-CM | POA: Diagnosis not present

## 2016-02-28 DIAGNOSIS — I739 Peripheral vascular disease, unspecified: Secondary | ICD-10-CM | POA: Diagnosis not present

## 2016-02-28 DIAGNOSIS — R079 Chest pain, unspecified: Secondary | ICD-10-CM | POA: Diagnosis not present

## 2016-03-17 DIAGNOSIS — G4730 Sleep apnea, unspecified: Secondary | ICD-10-CM | POA: Diagnosis not present

## 2016-03-17 DIAGNOSIS — K219 Gastro-esophageal reflux disease without esophagitis: Secondary | ICD-10-CM | POA: Diagnosis not present

## 2016-03-17 DIAGNOSIS — R0789 Other chest pain: Secondary | ICD-10-CM | POA: Diagnosis not present

## 2016-03-17 DIAGNOSIS — R079 Chest pain, unspecified: Secondary | ICD-10-CM | POA: Diagnosis not present

## 2016-03-23 DIAGNOSIS — R079 Chest pain, unspecified: Secondary | ICD-10-CM | POA: Diagnosis not present

## 2016-03-29 DIAGNOSIS — I2510 Atherosclerotic heart disease of native coronary artery without angina pectoris: Secondary | ICD-10-CM | POA: Diagnosis not present

## 2016-03-29 DIAGNOSIS — K219 Gastro-esophageal reflux disease without esophagitis: Secondary | ICD-10-CM | POA: Diagnosis not present

## 2016-05-01 DIAGNOSIS — L570 Actinic keratosis: Secondary | ICD-10-CM | POA: Diagnosis not present

## 2016-05-01 DIAGNOSIS — L578 Other skin changes due to chronic exposure to nonionizing radiation: Secondary | ICD-10-CM | POA: Diagnosis not present

## 2016-05-10 DIAGNOSIS — I10 Essential (primary) hypertension: Secondary | ICD-10-CM | POA: Diagnosis not present

## 2016-05-19 DIAGNOSIS — G4733 Obstructive sleep apnea (adult) (pediatric): Secondary | ICD-10-CM | POA: Diagnosis not present

## 2016-05-19 DIAGNOSIS — I739 Peripheral vascular disease, unspecified: Secondary | ICD-10-CM | POA: Diagnosis not present

## 2016-05-19 DIAGNOSIS — I10 Essential (primary) hypertension: Secondary | ICD-10-CM | POA: Diagnosis not present

## 2016-05-29 DIAGNOSIS — K635 Polyp of colon: Secondary | ICD-10-CM | POA: Diagnosis not present

## 2016-05-29 DIAGNOSIS — R109 Unspecified abdominal pain: Secondary | ICD-10-CM | POA: Diagnosis not present

## 2016-05-29 DIAGNOSIS — I10 Essential (primary) hypertension: Secondary | ICD-10-CM | POA: Diagnosis not present

## 2016-06-06 DIAGNOSIS — M25551 Pain in right hip: Secondary | ICD-10-CM | POA: Diagnosis not present

## 2016-06-07 DIAGNOSIS — I10 Essential (primary) hypertension: Secondary | ICD-10-CM | POA: Diagnosis not present

## 2016-06-07 DIAGNOSIS — Z13818 Encounter for screening for other digestive system disorders: Secondary | ICD-10-CM | POA: Diagnosis not present

## 2016-06-07 DIAGNOSIS — Z13220 Encounter for screening for lipoid disorders: Secondary | ICD-10-CM | POA: Diagnosis not present

## 2016-06-12 ENCOUNTER — Ambulatory Visit: Payer: Medicare Other | Admitting: Gastroenterology

## 2016-06-12 ENCOUNTER — Other Ambulatory Visit: Payer: Self-pay

## 2016-07-07 ENCOUNTER — Encounter: Payer: Self-pay | Admitting: Emergency Medicine

## 2016-07-07 ENCOUNTER — Emergency Department
Admission: EM | Admit: 2016-07-07 | Discharge: 2016-07-07 | Disposition: A | Discharge status: Home / Self Care | Payer: Medicare Other | Attending: Emergency Medicine | Admitting: Emergency Medicine

## 2016-07-07 DIAGNOSIS — I252 Old myocardial infarction: Secondary | ICD-10-CM | POA: Diagnosis not present

## 2016-07-07 DIAGNOSIS — Z79899 Other long term (current) drug therapy: Secondary | ICD-10-CM | POA: Diagnosis not present

## 2016-07-07 DIAGNOSIS — F17290 Nicotine dependence, other tobacco product, uncomplicated: Secondary | ICD-10-CM | POA: Diagnosis not present

## 2016-07-07 DIAGNOSIS — F17220 Nicotine dependence, chewing tobacco, uncomplicated: Secondary | ICD-10-CM | POA: Insufficient documentation

## 2016-07-07 DIAGNOSIS — F17210 Nicotine dependence, cigarettes, uncomplicated: Secondary | ICD-10-CM | POA: Diagnosis not present

## 2016-07-07 DIAGNOSIS — J029 Acute pharyngitis, unspecified: Secondary | ICD-10-CM | POA: Insufficient documentation

## 2016-07-07 DIAGNOSIS — I10 Essential (primary) hypertension: Secondary | ICD-10-CM | POA: Insufficient documentation

## 2016-07-07 DIAGNOSIS — Z85828 Personal history of other malignant neoplasm of skin: Secondary | ICD-10-CM | POA: Insufficient documentation

## 2016-07-07 LAB — POCT RAPID STREP A: STREPTOCOCCUS, GROUP A SCREEN (DIRECT): NEGATIVE

## 2016-07-07 MED ORDER — MAGIC MOUTHWASH W/LIDOCAINE: 10.0000 mL | Freq: Three times a day (TID) | ORAL | 0 refills | Status: DC

## 2016-07-07 NOTE — ED Notes (Signed)
Pt alert and oriented X4, active, cooperative, pt in NAD. RR even and unlabored, color WNL.  Pt informed to return if any life threatening symptoms occur.   

## 2016-07-07 NOTE — ED Triage Notes (Signed)
Sore throat x 1 week. Granddaughter recently had strep throat and was treated with antibiotics. Coughs occasionally. Reports watery eyes as well.  Pt using tylenol for pain. C/o pain when swallowing.

## 2016-07-08 NOTE — ED Provider Notes (Signed)
[name] Emergency [facility] Provider Note  ____________________________________________   None    (approximate)  I have reviewed the triage vital signs and the nursing notes.   HISTORY  Chief Complaint Sore Throat    HPI Karen [name] is a 70 y.o. female presenting to the emergency department with pharyngitis for one week, worse in the morning. Patient states that her granddaughter recently had  streptococcal pharyngitis. She denies rhinorrhea, congestion, cough, shortness of breath, chest pain, nausea and vomiting. Patient has used tylenol, which has provided limited relief. She is eating and drinking well. She is managing her own secretions.   Past Medical History:  Diagnosis Date  . Anxiety   . Back pain   . Bronchitis   . Cancer ([place])    Skin Cancer  . Depression   . GERD (gastroesophageal reflux disease)   . Hypercholesteremia   . Hyperlipemia   . Hypertension   . Myocardial infarction 1999  . Neuropathy ([place])   . Restless leg syndrome   . Sleep apnea    use C-PAP    Patient Active Problem List   Diagnosis Date Noted  . Herpes simplex virus (HSV) infection 12/09/2015  . Adnexal mass 12/09/2015  . Pelvic pain in female 11/25/2015  . Postcoital bleeding 11/25/2015  . History of total vaginal hysterectomy (TVH) 11/25/2015  . History of UTI 11/25/2015  . Vaginal atrophy 11/25/2015  . Dyspareunia in female 11/25/2015  . Breast tenderness 11/25/2015    Past Surgical History:  Procedure Laterality Date  . CORONARY ANGIOPLASTY WITH STENT PLACEMENT    . DILATION AND CURETTAGE OF UTERUS    . EYE SURGERY Bilateral    Cataract Extraction with IOL  . TUBAL LIGATION    . VAGINAL HYSTERECTOMY    . VULVECTOMY Right 01/31/2016   Procedure: WIDE EXCISION VULVECTOMY-RIGHT LABIA;  Surgeon: Brayton Mars, MD;  Location: [facility] ORS;  Service: Gynecology;  Laterality: Right;    Prior to Admission medications   Medication Sig Start  Date End Date Taking? Authorizing Provider  acyclovir (ZOVIRAX) 400 MG tablet 3 tablets daily for 5 days 02/15/16   Alanda Slim Defrancesco, MD  albuterol (PROVENTIL HFA;VENTOLIN HFA) 108 (90 BASE) MCG/ACT inhaler Inhale 2 puffs into the lungs every 4 (four) hours as needed for wheezing or shortness of breath. 07/06/15   Charline Bills Cuthriell, PA-C  ALPRAZolam Duanne Moron) 0.25 MG tablet Take 0.25 mg by mouth at bedtime as needed for anxiety.    Historical Provider, MD  amLODipine (NORVASC) 10 MG tablet Take 10 mg by mouth daily.    Historical Provider, MD  aspirin EC 81 MG tablet Take 81 mg by mouth daily. Last taken yesterday    Historical Provider, MD  Cholecalciferol (VITAMIN D3) 5000 units TABS Take 5,000 Units by mouth daily.    Historical Provider, MD  clopidogrel (PLAVIX) 75 MG tablet Take 75 mg by mouth daily.     Historical Provider, MD  conjugated estrogens (PREMARIN) vaginal cream Place 0.63 Applicatorfuls vaginally daily. 1/2 gram twice weekly 11/29/15   Alanda Slim Defrancesco, MD  dexlansoprazole (DEXILANT) 60 MG capsule Take 1 capsule by mouth daily.    Historical Provider, MD  DULoxetine (CYMBALTA) 60 MG capsule Take 60 mg by mouth daily.  11/01/15   Historical Provider, MD  furosemide (LASIX) 20 MG tablet Take 20 mg by mouth 2 (two) times daily.     Historical Provider, MD  gabapentin (NEURONTIN) 400 MG capsule Take 400 mg by mouth 3 (  three) times daily.     Historical Provider, MD  lisinopril (PRINIVIL,ZESTRIL) 10 MG tablet Take 10 mg by mouth daily.    Historical Provider, MD  magic mouthwash w/lidocaine SOLN Take 10 mLs by mouth 3 (three) times daily. 07/07/16   Lannie Fields, PA-C  metoprolol succinate (TOPROL-XL) 100 MG 24 hr tablet Take 100 mg by mouth daily.    Historical Provider, MD  nitroGLYCERIN (NITROSTAT) 0.4 MG SL tablet Place 1 tablet under the tongue every 5 (five) minutes as needed. Reported on 01/31/2016    Historical Provider, MD  potassium chloride (MICRO-K) 10 MEQ CR capsule  Take 10 mEq by mouth daily.     Historical Provider, MD  sertraline (ZOLOFT) 50 MG tablet  05/10/16   Historical Provider, MD  simvastatin (ZOCOR) 40 MG tablet Take 40 mg by mouth daily at 6 PM.     Historical Provider, MD  traMADol (ULTRAM) 50 MG tablet Take 1 tablet (50 mg total) by mouth every 6 (six) hours as needed. Patient taking differently: Take 50 mg by mouth every 6 (six) hours as needed for moderate pain.  12/01/14   III [name], PA-C    Allergies Celebrex [celecoxib]; Requip [ropinirole hcl]; Macrobid [nitrofurantoin monohyd macro]; and Nitrofurantoin  Family History  Problem Relation Age of Onset  . Heart disease Father   . Stroke Mother   . Hypertension Mother   . Lung cancer Brother   . Heart attack Brother   . Congestive Heart Failure Sister   . Cervical cancer Sister   . Cancer Neg Hx   . Diabetes Neg Hx   . Breast cancer Neg Hx     Social History Social History  Substance Use Topics  . Smoking status: Current Every Day Smoker    Packs/day: 0.50    Years: 40.00    Types: Cigarettes  . Smokeless tobacco: Current User    Types: Snuff, Chew  . Alcohol use No    Review of Systems Constitutional: No fever/chills Eyes: No visual changes. ENT: Has sore throat.  Cardiovascular: Denies chest pain. Respiratory: Denies shortness of breath. Gastrointestinal: No abdominal pain.  No nausea, no vomiting.  No diarrhea.  No constipation. Genitourinary: Negative for dysuria. Musculoskeletal: Negative for back pain. Skin: Negative for rash. Neurological: Negative for headaches, focal weakness or numbness.  10-point ROS otherwise negative.  ____________________________________________   PHYSICAL EXAM:  VITAL SIGNS: ED Triage Vitals  Enc Vitals Group     BP 07/07/16 1849 130/68     Pulse Rate 07/07/16 1849 (!) 55     Resp 07/07/16 1849 18     Temp 07/07/16 1849 98.4 F (36.9 C)     Temp Source 07/07/16 1849 Oral     SpO2 07/07/16 1849 95 %      Weight 07/07/16 1850 168 lb (76.2 kg)     Height 07/07/16 1850 '5\' 2"'$  (1.575 m)     Head Circumference --      Peak Flow --      Pain Score 07/07/16 1850 8     Pain Loc --      Pain Edu? --      Excl. in [place]? --     Constitutional: Alert and oriented. Well appearing and in no acute distress. Eyes: Conjunctivae are normal. PERRL. EOMI. Head: Atraumatic. Nose: No congestion/rhinnorhea. Mouth/Throat: Posterior pharynx is mildly erythematous. There is no tonsillar exudate or petechiae visualized. Uvula is midline. Neck: No palpable cervical lymphadenopathy. Cardiovascular: Normal rate, regular rhythm.  Grossly normal heart sounds.  Good peripheral circulation. Respiratory: Normal respiratory effort.  No retractions. Lungs CTAB. Gastrointestinal: Soft and nontender. No distention. No abdominal bruits. No CVA tenderness. Skin:  Skin is warm, dry and intact. No rash noted.   ____________________________________________   LABS (all labs ordered are listed, but only abnormal results are displayed)  Labs Reviewed  CULTURE, GROUP A STREP [facility])  POCT RAPID STREP A    Procedures None   ____________________________________________   INITIAL IMPRESSION / ASSESSMENT AND PLAN / ED COURSE  Pertinent labs & imaging results that were available during my care of the patient were reviewed by me and considered in my medical decision making (see chart for details).    Clinical Course    Assessment and plan: Patient's rapid strep was negative. No exudate, tonsillar hypertrophy or petechiae visible on physical exam. Posterior pharynx is only mildly erythematous. I do not suspect streptococcal infection. Patient was given a prescription for Magic mouthwash. A viral source for pharyngitis is likely. Patient's vital signs are reassuring at this time. All patient questions were answered.  ____________________________________________   FINAL CLINICAL IMPRESSION(S) / ED DIAGNOSES  Final  diagnoses:  Viral pharyngitis      NEW MEDICATIONS STARTED DURING THIS VISIT:  Discharge Medication List as of 07/07/2016  8:06 PM    START taking these medications   Details  magic mouthwash w/lidocaine SOLN Take 10 mLs by mouth 3 (three) times daily., Starting Fri 07/07/2016, Print         Note:  This document was prepared using Dragon voice recognition software and may include unintentional dictation errors.    Lannie Fields, PA-C 07/08/16 0008    Hinda Kehr, MD 07/08/16 [id number]

## 2016-07-10 ENCOUNTER — Ambulatory Visit: Payer: Medicare Other | Admitting: Gastroenterology

## 2016-07-10 LAB — CULTURE, GROUP A STREP (THRC)

## 2016-07-11 DIAGNOSIS — M25531 Pain in right wrist: Secondary | ICD-10-CM | POA: Diagnosis not present

## 2016-07-11 DIAGNOSIS — R609 Edema, unspecified: Secondary | ICD-10-CM | POA: Diagnosis not present

## 2016-07-11 DIAGNOSIS — I10 Essential (primary) hypertension: Secondary | ICD-10-CM | POA: Diagnosis not present

## 2016-07-31 ENCOUNTER — Encounter: Payer: Self-pay | Admitting: Gastroenterology

## 2016-07-31 ENCOUNTER — Ambulatory Visit (INDEPENDENT_AMBULATORY_CARE_PROVIDER_SITE_OTHER): Payer: Medicare Other | Admitting: Gastroenterology

## 2016-07-31 ENCOUNTER — Other Ambulatory Visit: Payer: Self-pay

## 2016-07-31 VITALS — BP 145/73 | HR 55 | Temp 98.2°F | Ht 63.0 in | Wt 167.0 lb

## 2016-07-31 DIAGNOSIS — R1033 Periumbilical pain: Secondary | ICD-10-CM

## 2016-07-31 NOTE — Progress Notes (Signed)
Gastroenterology Consultation  Referring Provider:     Cletis Athens, MD Primary Care Physician:  Pcp Not In System Primary Gastroenterologist:  Dr. Allen Norris     Reason for Consultation:     Abdominal pain        HPI:   Karen [name] is a 71 y.o. y/o female referred for consultation & management of Abdominal pain by Dr. Merryl Hacker Not In System.  This patient comes in today with a report of abdominal pain. The patient reports the abdominal pain to be. Umbilical. The patient also reports that the pain is not associated with eating or drinking. She also reports that the abdominal pain does not improve with defecation or get worse with constipation. The patient reported that the pain is only present when the doctor pressed on her abdomen. There is no report of any unexplained weight loss fevers chills nausea vomiting. The patient does report that she had a colonoscopy in 2011 and has a history of adenomatous polyps. The patient is due for her repeat colonoscopy.  Past Medical History:  Diagnosis Date  . Anxiety   . Back pain   . Bronchitis   . Cancer ([place])    Skin Cancer  . Depression   . GERD (gastroesophageal reflux disease)   . Hypercholesteremia   . Hyperlipemia   . Hypertension   . Myocardial infarction 1999  . Neuropathy ([place])   . Restless leg syndrome   . Sleep apnea    use C-PAP    Past Surgical History:  Procedure Laterality Date  . CORONARY ANGIOPLASTY WITH STENT PLACEMENT    . DILATION AND CURETTAGE OF UTERUS    . EYE SURGERY Bilateral    Cataract Extraction with IOL  . TUBAL LIGATION    . VAGINAL HYSTERECTOMY    . VULVECTOMY Right 01/31/2016   Procedure: WIDE EXCISION VULVECTOMY-RIGHT LABIA;  Surgeon: Brayton Mars, MD;  Location: [facility] ORS;  Service: Gynecology;  Laterality: Right;    Prior to Admission medications   Medication Sig Start Date End Date Taking? Authorizing Provider  acyclovir (ZOVIRAX) 400 MG tablet 3 tablets daily for 5 days 02/15/16  Yes Alanda Slim Defrancesco, MD  albuterol (PROVENTIL HFA;VENTOLIN HFA) 108 (90 BASE) MCG/ACT inhaler Inhale 2 puffs into the lungs every 4 (four) hours as needed for wheezing or shortness of breath. 07/06/15  Yes Roderic Palau D Cuthriell, PA-C  ALPRAZolam (XANAX) 0.25 MG tablet Take 0.25 mg by mouth at bedtime as needed for anxiety.   Yes Historical Provider, MD  amLODipine (NORVASC) 10 MG tablet Take 10 mg by mouth daily.   Yes Historical Provider, MD  aspirin EC 81 MG tablet Take 81 mg by mouth daily. Last taken yesterday   Yes Historical Provider, MD  Cholecalciferol (VITAMIN D3) 5000 units TABS Take 5,000 Units by mouth daily.   Yes Historical Provider, MD  clopidogrel (PLAVIX) 75 MG tablet Take 75 mg by mouth daily.    Yes Historical Provider, MD  conjugated estrogens (PREMARIN) vaginal cream Place 2.40 Applicatorfuls vaginally daily. 1/2 gram twice weekly 11/29/15  Yes Alanda Slim Defrancesco, MD  dexlansoprazole (DEXILANT) 60 MG capsule Take 1 capsule by mouth daily.   Yes Historical Provider, MD  DULoxetine (CYMBALTA) 30 MG capsule  07/11/16  Yes Historical Provider, MD  furosemide (LASIX) 20 MG tablet Take 20 mg by mouth 2 (two) times daily.    Yes Historical Provider, MD  gabapentin (NEURONTIN) 400 MG capsule Take 400 mg by mouth 3 (three) times daily.  Yes Historical Provider, MD  lisinopril (PRINIVIL,ZESTRIL) 10 MG tablet Take 10 mg by mouth daily.   Yes Historical Provider, MD  metoprolol succinate (TOPROL-XL) 100 MG 24 hr tablet Take 100 mg by mouth daily.   Yes Historical Provider, MD  nitroGLYCERIN (NITROSTAT) 0.4 MG SL tablet Place 1 tablet under the tongue every 5 (five) minutes as needed. Reported on 01/31/2016   Yes Historical Provider, MD  potassium chloride (MICRO-K) 10 MEQ CR capsule Take 10 mEq by mouth daily.    Yes Historical Provider, MD  simvastatin (ZOCOR) 40 MG tablet Take 40 mg by mouth daily at 6 PM.    Yes Historical Provider, MD  traMADol (ULTRAM) 50 MG tablet Take 1 tablet (50 mg  total) by mouth every 6 (six) hours as needed. Patient taking differently: Take 50 mg by mouth every 6 (six) hours as needed for moderate pain.  12/01/14  Yes III [name], PA-C  TRAZODONE HCL PO Take by mouth.   Yes Historical Provider, MD  magic mouthwash w/lidocaine SOLN Take 10 mLs by mouth 3 (three) times daily. Patient not taking: Reported on 07/31/2016 07/07/16   Lannie Fields, PA-C  sertraline (ZOLOFT) 50 MG tablet  05/10/16   Historical Provider, MD    Family History  Problem Relation Age of Onset  . Heart disease Father   . Stroke Mother   . Hypertension Mother   . Lung cancer Brother   . Heart attack Brother   . Congestive Heart Failure Sister   . Cervical cancer Sister   . Cancer Neg Hx   . Diabetes Neg Hx   . Breast cancer Neg Hx      Social History  Substance Use Topics  . Smoking status: Current Every Day Smoker    Packs/day: 0.50    Years: 40.00    Types: Cigarettes  . Smokeless tobacco: Current User    Types: Snuff  . Alcohol use No    Allergies as of 07/31/2016 - Review Complete 07/31/2016  Allergen Reaction Noted  . Celebrex [celecoxib] Itching 12/01/2014  . Requip [ropinirole hcl] Other (See Comments) 12/01/2014  . Macrobid [nitrofurantoin monohyd macro] Palpitations 12/01/2014  . Nitrofurantoin Palpitations 11/10/2015    Review of Systems:    All systems reviewed and negative except where noted in HPI.   Physical Exam:  BP (!) 145/73   Pulse (!) 55   Temp 98.2 F (36.8 C) (Oral)   Ht '5\' 3"'$  (1.6 m)   Wt 167 lb (75.8 kg)   BMI 29.58 kg/m  No LMP recorded. Patient has had a hysterectomy. Psych:  Alert and cooperative. Normal mood and affect. General:   Alert,  Well-developed, well-nourished, pleasant and cooperative in NAD Head:  Normocephalic and atraumatic. Eyes:  Sclera clear, no icterus.   Conjunctiva pink. Ears:  Normal auditory acuity. Nose:  No deformity, discharge, or lesions. Mouth:  No deformity or lesions,oropharynx pink  & moist. Neck:  Supple; no masses or thyromegaly. Lungs:  Respirations even and unlabored.  Clear throughout to auscultation.   No wheezes, crackles, or rhonchi. No acute distress. Heart:  Regular rate and rhythm; no murmurs, clicks, rubs, or gallops. Abdomen:  Normal bowel sounds.  No bruits.  Soft, Mild tenderness around her ventral hernia and non-distended without masses, hepatosplenomegaly..  No guarding or rebound tenderness.  Negative Carnett sign.   Rectal:  Deferred.  Msk:  Symmetrical without gross deformities.  Good, equal movement & strength bilaterally. Pulses:  Normal pulses noted. Extremities:  No clubbing or edema.  No cyanosis. Lipoma on her right arm. Neurologic:  Alert and oriented x3;  grossly normal neurologically. Skin:  Intact without significant lesions or rashes.  No jaundice. Lymph Nodes:  No significant cervical adenopathy. Psych:  Alert and cooperative. Normal mood and affect.  Imaging Studies: No results found.  Assessment and Plan:   Joanann Toni [name] is a 71 y.o. y/o female who comes in today with her umbilical pain that is likely due to her abdominal wall hernia. Is no incarceration or strangulation at the present time. The patient also has a history of adenomatous polyps and is due for a repeat colonoscopy. The patient will be set up for colonoscopy for surveillance. I have discussed risks & benefits which include, but are not limited to, bleeding, infection, perforation & drug reaction.  The patient agrees with this plan & written consent will be obtained.       Lucilla Lame, MD. Marval Regal   Note: This dictation was prepared with Dragon dictation along with smaller phrase technology. Any transcriptional errors that result from this process are unintentional.

## 2016-08-01 ENCOUNTER — Other Ambulatory Visit: Payer: Self-pay | Admitting: *Deleted

## 2016-08-07 DIAGNOSIS — G4489 Other headache syndrome: Secondary | ICD-10-CM | POA: Diagnosis not present

## 2016-08-07 DIAGNOSIS — R42 Dizziness and giddiness: Secondary | ICD-10-CM | POA: Diagnosis not present

## 2016-08-07 DIAGNOSIS — I10 Essential (primary) hypertension: Secondary | ICD-10-CM | POA: Diagnosis not present

## 2016-08-14 DIAGNOSIS — M47816 Spondylosis without myelopathy or radiculopathy, lumbar region: Secondary | ICD-10-CM | POA: Diagnosis not present

## 2016-08-14 DIAGNOSIS — M7061 Trochanteric bursitis, right hip: Secondary | ICD-10-CM | POA: Diagnosis not present

## 2016-08-14 DIAGNOSIS — M545 Low back pain: Secondary | ICD-10-CM | POA: Diagnosis not present

## 2016-08-14 DIAGNOSIS — S66911A Strain of unspecified muscle, fascia and tendon at wrist and hand level, right hand, initial encounter: Secondary | ICD-10-CM | POA: Diagnosis not present

## 2016-08-14 DIAGNOSIS — M25531 Pain in right wrist: Secondary | ICD-10-CM | POA: Diagnosis not present

## 2016-08-21 DIAGNOSIS — R42 Dizziness and giddiness: Secondary | ICD-10-CM | POA: Diagnosis not present

## 2016-08-21 DIAGNOSIS — G4700 Insomnia, unspecified: Secondary | ICD-10-CM | POA: Diagnosis not present

## 2016-08-21 DIAGNOSIS — I10 Essential (primary) hypertension: Secondary | ICD-10-CM | POA: Diagnosis not present

## 2016-08-29 NOTE — Discharge Instructions (Signed)

## 2016-08-30 ENCOUNTER — Encounter: Payer: Self-pay | Admitting: *Deleted

## 2016-08-30 DIAGNOSIS — L570 Actinic keratosis: Secondary | ICD-10-CM | POA: Diagnosis not present

## 2016-08-30 DIAGNOSIS — Z872 Personal history of diseases of the skin and subcutaneous tissue: Secondary | ICD-10-CM | POA: Diagnosis not present

## 2016-08-30 DIAGNOSIS — D1800 Hemangioma unspecified site: Secondary | ICD-10-CM | POA: Diagnosis not present

## 2016-08-30 DIAGNOSIS — L821 Other seborrheic keratosis: Secondary | ICD-10-CM | POA: Diagnosis not present

## 2016-08-30 DIAGNOSIS — Z859 Personal history of malignant neoplasm, unspecified: Secondary | ICD-10-CM | POA: Diagnosis not present

## 2016-08-31 ENCOUNTER — Encounter: Payer: Self-pay | Admitting: Anesthesiology

## 2016-09-01 ENCOUNTER — Emergency Department
Admission: EM | Admit: 2016-09-01 | Discharge: 2016-09-02 | Disposition: A | Discharge status: Home / Self Care | Payer: Medicare Other | Attending: Emergency Medicine | Admitting: Emergency Medicine

## 2016-09-01 ENCOUNTER — Encounter: Payer: Self-pay | Admitting: Emergency Medicine

## 2016-09-01 DIAGNOSIS — F17210 Nicotine dependence, cigarettes, uncomplicated: Secondary | ICD-10-CM | POA: Insufficient documentation

## 2016-09-01 DIAGNOSIS — F17290 Nicotine dependence, other tobacco product, uncomplicated: Secondary | ICD-10-CM | POA: Diagnosis not present

## 2016-09-01 DIAGNOSIS — Z85828 Personal history of other malignant neoplasm of skin: Secondary | ICD-10-CM | POA: Insufficient documentation

## 2016-09-01 DIAGNOSIS — I252 Old myocardial infarction: Secondary | ICD-10-CM | POA: Diagnosis not present

## 2016-09-01 DIAGNOSIS — R05 Cough: Secondary | ICD-10-CM | POA: Diagnosis not present

## 2016-09-01 DIAGNOSIS — Z7982 Long term (current) use of aspirin: Secondary | ICD-10-CM | POA: Insufficient documentation

## 2016-09-01 DIAGNOSIS — J449 Chronic obstructive pulmonary disease, unspecified: Secondary | ICD-10-CM | POA: Diagnosis not present

## 2016-09-01 DIAGNOSIS — Z79899 Other long term (current) drug therapy: Secondary | ICD-10-CM | POA: Diagnosis not present

## 2016-09-01 DIAGNOSIS — J101 Influenza due to other identified influenza virus with other respiratory manifestations: Secondary | ICD-10-CM

## 2016-09-01 DIAGNOSIS — I10 Essential (primary) hypertension: Secondary | ICD-10-CM | POA: Insufficient documentation

## 2016-09-01 DIAGNOSIS — J09X2 Influenza due to identified novel influenza A virus with other respiratory manifestations: Secondary | ICD-10-CM | POA: Diagnosis not present

## 2016-09-01 MED ORDER — IPRATROPIUM-ALBUTEROL 0.5-2.5 (3) MG/3ML IN SOLN
3.0000 mL | Freq: Once | RESPIRATORY_TRACT | Status: AC
  Administered 2016-09-02: 3 mL via RESPIRATORY_TRACT
  Filled 2016-09-01: qty 3

## 2016-09-01 NOTE — ED Triage Notes (Signed)
Pt ambulatory to triage in NAD, report generalized body aches since yesterday, also states her legs felt like "jelly" to the point she couldn't walk.  Additionally, pt was at dermatology 2 days ago, had area on right middle finger frozen, now large blood-filled blister ruptured.  Slow bleeding at this time, dressed in triage.

## 2016-09-01 NOTE — ED Provider Notes (Signed)
[facility] Emergency Department Provider Note  ____________________________________________  Time seen: Approximately 11:54 PM  I have reviewed the triage vital signs and the nursing notes.   HISTORY  Chief Complaint Generalized Body Aches and Wound Check   HPI Karen [name] is a 71 y.o. female the history of COPD, smoking, hypertension, hyperlipidemia who presents for evaluation of body aches and chills. Patient reports that her symptoms started yesterday. She has a cough productive of white sputum, chills, diarrhea, body aches, and generalized weakness. She denies chest pain or shortness of breath, fever, abdominal pain, dysuria, hematuria, vomiting, sore throat. She has not needed her inhalers at home.  Past Medical History:  Diagnosis Date  . Anxiety   . Back pain   . Bronchitis   . Cancer ([place])    Skin Cancer  . Depression   . GERD (gastroesophageal reflux disease)   . Hypercholesteremia   . Hyperlipemia   . Hypertension   . Myocardial infarction 1999  . Neuropathy ([place])   . Restless leg syndrome   . Sleep apnea    use C-PAP  . Wears dentures    full upper, partial lower.  doesn't currently wear    Patient Active Problem List   Diagnosis Date Noted  . Herpes simplex virus (HSV) infection 12/09/2015  . Adnexal mass 12/09/2015  . Pelvic pain in female 11/25/2015  . Postcoital bleeding 11/25/2015  . History of total vaginal hysterectomy (TVH) 11/25/2015  . History of UTI 11/25/2015  . Vaginal atrophy 11/25/2015  . Dyspareunia in female 11/25/2015  . Breast tenderness 11/25/2015    Past Surgical History:  Procedure Laterality Date  . CORONARY ANGIOPLASTY WITH STENT PLACEMENT    . DILATION AND CURETTAGE OF UTERUS    . EYE SURGERY Bilateral    Cataract Extraction with IOL  . TUBAL LIGATION    . VAGINAL HYSTERECTOMY    . VULVECTOMY Right 01/31/2016   Procedure: WIDE EXCISION VULVECTOMY-RIGHT LABIA;  Surgeon: Brayton Mars, MD;  Location: [facility] ORS;  Service: Gynecology;  Laterality: Right;    Prior to Admission medications   Medication Sig Start Date End Date Taking? Authorizing Provider  albuterol (PROVENTIL HFA;VENTOLIN HFA) 108 (90 BASE) MCG/ACT inhaler Inhale 2 puffs into the lungs every 4 (four) hours as needed for wheezing or shortness of breath. 07/06/15   Charline Bills Cuthriell, PA-C  ALPRAZolam Duanne Moron) 0.25 MG tablet Take 0.25 mg by mouth at bedtime as needed for anxiety.    Historical Provider, MD  amLODipine (NORVASC) 10 MG tablet Take 10 mg by mouth daily.    Historical Provider, MD  aspirin EC 81 MG tablet Take 81 mg by mouth daily. Last taken yesterday    Historical Provider, MD  Cholecalciferol (VITAMIN D3) 5000 units TABS Take 5,000 Units by mouth daily.    Historical Provider, MD  clopidogrel (PLAVIX) 75 MG tablet Take 75 mg by mouth daily.     Historical Provider, MD  conjugated estrogens (PREMARIN) vaginal cream Place 7.82 Applicatorfuls vaginally daily. 1/2 gram twice weekly Patient not taking: Reported on 08/30/2016 11/29/15   Alanda Slim Defrancesco, MD  dexlansoprazole (DEXILANT) 60 MG capsule Take 1 capsule by mouth daily.    Historical Provider, MD  DULoxetine (CYMBALTA) 30 MG capsule  07/11/16   Historical Provider, MD  furosemide (LASIX) 20 MG tablet Take 20 mg by mouth 2 (two) times daily.     Historical Provider, MD  gabapentin (NEURONTIN) 400 MG capsule Take 400 mg by mouth  3 (three) times daily.     Historical Provider, MD  lisinopril (PRINIVIL,ZESTRIL) 10 MG tablet Take 10 mg by mouth daily.    Historical Provider, MD  metoprolol succinate (TOPROL-XL) 100 MG 24 hr tablet Take 100 mg by mouth daily.    Historical Provider, MD  nitroGLYCERIN (NITROSTAT) 0.4 MG SL tablet Place 1 tablet under the tongue every 5 (five) minutes as needed. Reported on 01/31/2016    Historical Provider, MD  oseltamivir (TAMIFLU) 30 MG capsule Take 1 capsule (30 mg total) by mouth 2 (two) times daily.  09/02/16 09/07/16  Rudene Re, MD  potassium chloride (MICRO-K) 10 MEQ CR capsule Take 10 mEq by mouth daily.     Historical Provider, MD  simvastatin (ZOCOR) 40 MG tablet Take 40 mg by mouth daily at 6 PM.     Historical Provider, MD  traMADol (ULTRAM) 50 MG tablet Take 1 tablet (50 mg total) by mouth every 6 (six) hours as needed. Patient taking differently: Take 50 mg by mouth every 6 (six) hours as needed for moderate pain.  12/01/14   III William C Ruffian, PA-C  TRAZODONE HCL PO Take by mouth.    Historical Provider, MD    Allergies Celebrex [celecoxib]; Requip [ropinirole hcl]; Macrobid [nitrofurantoin monohyd macro]; and Nitrofurantoin  Family History  Problem Relation Age of Onset  . Heart disease Father   . Stroke Mother   . Hypertension Mother   . Lung cancer Brother   . Heart attack Brother   . Congestive Heart Failure Sister   . Cervical cancer Sister   . Cancer Neg Hx   . Diabetes Neg Hx   . Breast cancer Neg Hx     Social History Social History  Substance Use Topics  . Smoking status: Current Every Day Smoker    Packs/day: 0.25    Years: 40.00    Types: Cigarettes  . Smokeless tobacco: Current User    Types: Snuff  . Alcohol use No    Review of Systems Constitutional: Negative for fever. + chills, generalized weakness Eyes: Negative for visual changes. ENT: Negative for sore throat. Neck: No neck pain  Cardiovascular: Negative for chest pain. Respiratory: Negative for shortness of breath. Gastrointestinal: Negative for abdominal pain, vomiting. + diarrhea. Genitourinary: Negative for dysuria. Musculoskeletal: Negative for back pain. Skin: Negative for rash. Neurological: Negative for headaches, weakness or numbness. Psych: No SI or HI  ____________________________________________   PHYSICAL EXAM:  VITAL SIGNS: ED Triage Vitals [09/01/16 2113]  Enc Vitals Group     BP (!) 146/71     Pulse Rate (!) 59     Resp 16     Temp 97.9 F (36.6  C)     Temp Source Oral     SpO2 97 %     Weight 164 lb (74.4 kg)     Height '5\' 2"'$  (1.575 m)     Head Circumference      Peak Flow      Pain Score 4     Pain Loc      Pain Edu?      Excl. in [place]?     Constitutional: Alert and oriented. Well appearing and in no apparent distress. HEENT:      Head: Normocephalic and atraumatic.         Eyes: Conjunctivae are normal. Sclera is non-icteric. EOMI. PERRL      Mouth/Throat: Mucous membranes are moist.       Neck: Supple with no signs of meningismus.  Cardiovascular: Regular rate and rhythm. No murmurs, gallops, or rubs. 2+ symmetrical distal pulses are present in all extremities. No JVD. Respiratory: Normal respiratory effort. Patient has coarse rhonchi bilaterally in the next per Elder Love wheezes throughout Gastrointestinal: Soft, non tender, and non distended with positive bowel sounds. No rebound or guarding. Musculoskeletal: Nontender with normal range of motion in all extremities. No edema, cyanosis, or erythema of extremities. Neurologic: Normal speech and language. Face is symmetric. Moving all extremities. No gross focal neurologic deficits are appreciated. Skin: Skin is warm, dry and intact. No rash noted. Psychiatric: Mood and affect are normal. Speech and behavior are normal.  ____________________________________________   LABS (all labs ordered are listed, but only abnormal results are displayed)  Labs Reviewed  INFLUENZA PANEL BY PCR (TYPE A & B) - Abnormal; Notable for the following:       Result Value   Influenza A By PCR POSITIVE (*)    All other components within normal limits  CBC WITH DIFFERENTIAL/PLATELET - Abnormal; Notable for the following:    Platelets 131 (*)    Lymphs Abs 5.0 (*)    Monocytes Absolute 1.0 (*)    All other components within normal limits  BASIC METABOLIC PANEL - Abnormal; Notable for the following:    Sodium 131 (*)    Chloride 95 (*)    Glucose, Bld 103 (*)    Creatinine, Ser 1.30 (*)     GFR calc non Af Amer 40 (*)    GFR calc Af Amer 47 (*)    All other components within normal limits  URINALYSIS, COMPLETE (UACMP) WITH MICROSCOPIC - Abnormal; Notable for the following:    Color, Urine YELLOW (*)    APPearance CLEAR (*)    Squamous Epithelial / LPF 0-5 (*)    All other components within normal limits   ____________________________________________  EKG  none ____________________________________________  RADIOLOGY  CXR: negative  ____________________________________________   PROCEDURES  Procedure(s) performed: None Procedures Critical Care performed:  None ____________________________________________   INITIAL IMPRESSION / ASSESSMENT AND PLAN / ED COURSE  71 y.o. female the history of COPD, smoking, hypertension, hyperlipidemia who presents for evaluation of body aches and chills and cough since yesterday. No chest pain or shortness of breath. Patient has normal vital signs, patient has coarse rhonchi bilaterally and expiratory wheezes. She has normal work of breathing, normal sats. Differential diagnoses including flu, pneumonia, viral URI. We'll give duoneb, get a chest x-ray, flu swab, basic blood work, and reassess.  Clinical Course as of Sep 02 117  Sat Sep 02, 2016  0116 Patient's positive for influenza A. She has been started on Tamiflu. She has mild a KI and was given a liter fluid. VS remain stable. She is tolerating by mouth with no vomiting. Chest x-ray with no evidence of pneumonia. Patient be discharged home with supportive care, Tamiflu 30 mg twice a day for 5 days and close follow-up with PCP on Monday  [CV]    Clinical Course User Index [CV] Rudene Re, MD    Pertinent labs & imaging results that were available during my care of the patient were reviewed by me and considered in my medical decision making (see chart for details).    ____________________________________________   FINAL CLINICAL IMPRESSION(S) / ED  DIAGNOSES  Final diagnoses:  Influenza A      NEW MEDICATIONS STARTED DURING THIS VISIT:  New Prescriptions   OSELTAMIVIR (TAMIFLU) 30 MG CAPSULE    Take 1 capsule (30 mg total) by  mouth 2 (two) times daily.     Note:  This document was prepared using Dragon voice recognition software and may include unintentional dictation errors.    Rudene Re, MD 09/02/16 [phone]

## 2016-09-02 ENCOUNTER — Emergency Department: Payer: Medicare Other

## 2016-09-02 DIAGNOSIS — J09X2 Influenza due to identified novel influenza A virus with other respiratory manifestations: Secondary | ICD-10-CM | POA: Diagnosis not present

## 2016-09-02 DIAGNOSIS — R05 Cough: Secondary | ICD-10-CM | POA: Diagnosis not present

## 2016-09-02 LAB — CBC WITH DIFFERENTIAL/PLATELET
BASOS PCT: 0 %
Basophils Absolute: 0 10*3/uL (ref 0–0.1)
EOS ABS: 0 10*3/uL (ref 0–0.7)
Eosinophils Relative: 0 %
HCT: 40.7 % (ref 35.0–47.0)
Hemoglobin: 13.5 g/dL (ref 12.0–16.0)
LYMPHS ABS: 5 10*3/uL — AB (ref 1.0–3.6)
Lymphocytes Relative: 67 %
MCH: 31.2 pg (ref 26.0–34.0)
MCHC: 33.1 g/dL (ref 32.0–36.0)
MCV: 94.3 fL (ref 80.0–100.0)
MONO ABS: 1 10*3/uL — AB (ref 0.2–0.9)
Monocytes Relative: 13 %
NEUTROS PCT: 20 %
Neutro Abs: 1.5 10*3/uL (ref 1.4–6.5)
PLATELETS: 131 10*3/uL — AB (ref 150–440)
RBC: 4.32 MIL/uL (ref 3.80–5.20)
RDW: 13.8 % (ref 11.5–14.5)
WBC: 7.5 10*3/uL (ref 3.6–11.0)

## 2016-09-02 LAB — BASIC METABOLIC PANEL
Anion gap: 6 (ref 5–15)
BUN: 11 mg/dL (ref 6–20)
CALCIUM: 9.4 mg/dL (ref 8.9–10.3)
CO2: 30 mmol/L (ref 22–32)
Chloride: 95 mmol/L — ABNORMAL LOW (ref 101–111)
Creatinine, Ser: 1.3 mg/dL — ABNORMAL HIGH (ref 0.44–1.00)
GFR calc Af Amer: 47 mL/min — ABNORMAL LOW (ref >60)
GFR, EST NON AFRICAN AMERICAN: 40 mL/min — AB (ref >60)
GLUCOSE: 103 mg/dL — AB (ref 65–99)
POTASSIUM: 3.5 mmol/L (ref 3.5–5.1)
SODIUM: 131 mmol/L — AB (ref 135–145)

## 2016-09-02 LAB — URINALYSIS, COMPLETE (UACMP) WITH MICROSCOPIC
Bacteria, UA: NONE SEEN
Bilirubin Urine: NEGATIVE
Glucose, UA: NEGATIVE mg/dL
Hgb urine dipstick: NEGATIVE
Ketones, ur: NEGATIVE mg/dL
Leukocytes, UA: NEGATIVE
Nitrite: NEGATIVE
PH: 5 (ref 5.0–8.0)
Protein, ur: NEGATIVE mg/dL
SPECIFIC GRAVITY, URINE: 1.013 (ref 1.005–1.030)

## 2016-09-02 LAB — INFLUENZA PANEL BY PCR (TYPE A & B)
INFLAPCR: POSITIVE — AB
Influenza B By PCR: NEGATIVE

## 2016-09-02 MED ORDER — ACETAMINOPHEN 500 MG PO TABS
1000.0000 mg | ORAL_TABLET | Freq: Once | ORAL | Status: AC
  Administered 2016-09-02: 1000 mg via ORAL
  Filled 2016-09-02: qty 2

## 2016-09-02 MED ORDER — OSELTAMIVIR PHOSPHATE 30 MG PO CAPS
30.0000 mg | ORAL_CAPSULE | Freq: Once | ORAL | Status: AC
  Administered 2016-09-02: 30 mg via ORAL
  Filled 2016-09-02: qty 1

## 2016-09-02 MED ORDER — OSELTAMIVIR PHOSPHATE 30 MG PO CAPS: 30.0000 mg | ORAL_CAPSULE | Freq: Two times a day (BID) | ORAL | 0 refills | Status: AC

## 2016-09-02 MED ORDER — SODIUM CHLORIDE 0.9 % IV BOLUS (SEPSIS)
1000.0000 mL | Freq: Once | INTRAVENOUS | Status: AC
  Administered 2016-09-02: 1000 mL via INTRAVENOUS

## 2016-09-04 ENCOUNTER — Ambulatory Visit: Admission: RE | Admit: 2016-09-04 | Payer: Medicare Other | Source: Ambulatory Visit | Admitting: Gastroenterology

## 2016-09-04 ENCOUNTER — Other Ambulatory Visit: Payer: Self-pay

## 2016-09-04 HISTORY — DX: Presence of dental prosthetic device (complete) (partial): Z97.2

## 2016-09-04 SURGERY — COLONOSCOPY WITH PROPOFOL
Anesthesia: Choice

## 2016-09-07 DIAGNOSIS — J441 Chronic obstructive pulmonary disease with (acute) exacerbation: Secondary | ICD-10-CM | POA: Diagnosis not present

## 2016-09-07 DIAGNOSIS — I10 Essential (primary) hypertension: Secondary | ICD-10-CM | POA: Diagnosis not present

## 2016-09-20 ENCOUNTER — Encounter: Payer: Self-pay | Admitting: Anesthesiology

## 2016-09-20 ENCOUNTER — Encounter: Payer: Self-pay | Admitting: *Deleted

## 2016-09-22 NOTE — Discharge Instructions (Signed)

## 2016-09-25 ENCOUNTER — Ambulatory Visit: Admission: RE | Admit: 2016-09-25 | Payer: Medicare Other | Source: Ambulatory Visit | Admitting: Gastroenterology

## 2016-09-25 SURGERY — COLONOSCOPY WITH PROPOFOL
Anesthesia: Choice

## 2016-10-04 DIAGNOSIS — R42 Dizziness and giddiness: Secondary | ICD-10-CM | POA: Diagnosis not present

## 2016-10-04 DIAGNOSIS — G319 Degenerative disease of nervous system, unspecified: Secondary | ICD-10-CM | POA: Diagnosis not present

## 2016-10-09 DIAGNOSIS — I10 Essential (primary) hypertension: Secondary | ICD-10-CM | POA: Diagnosis not present

## 2016-10-09 DIAGNOSIS — J329 Chronic sinusitis, unspecified: Secondary | ICD-10-CM | POA: Diagnosis not present

## 2016-10-17 ENCOUNTER — Telehealth: Payer: Self-pay | Admitting: Gastroenterology

## 2016-10-17 DIAGNOSIS — D1800 Hemangioma unspecified site: Secondary | ICD-10-CM | POA: Diagnosis not present

## 2016-10-17 DIAGNOSIS — L720 Epidermal cyst: Secondary | ICD-10-CM | POA: Diagnosis not present

## 2016-10-17 DIAGNOSIS — L570 Actinic keratosis: Secondary | ICD-10-CM | POA: Diagnosis not present

## 2016-10-17 NOTE — Telephone Encounter (Signed)
Patient stopped by the office to reschedule her colonoscopy. She would like it to be after Easter.

## 2016-10-18 ENCOUNTER — Other Ambulatory Visit: Payer: Self-pay

## 2016-10-18 DIAGNOSIS — Z1211 Encounter for screening for malignant neoplasm of colon: Secondary | ICD-10-CM

## 2016-10-18 NOTE — Telephone Encounter (Signed)
Pt rescheduled for colonoscopy to 11/09/16 at [facility]

## 2016-11-02 ENCOUNTER — Encounter: Payer: Self-pay | Admitting: *Deleted

## 2016-11-03 NOTE — Discharge Instructions (Signed)

## 2016-11-07 ENCOUNTER — Telehealth: Payer: Self-pay | Admitting: Gastroenterology

## 2016-11-07 NOTE — Telephone Encounter (Signed)
11/07/16 Per website NO prior auth required for Colonoscopy 78478 / Z12.11 Decision ID# [id number]

## 2016-11-14 ENCOUNTER — Telehealth: Payer: Self-pay | Admitting: Gastroenterology

## 2016-11-14 ENCOUNTER — Other Ambulatory Visit: Payer: Self-pay | Admitting: Internal Medicine

## 2016-11-14 DIAGNOSIS — Z1231 Encounter for screening mammogram for malignant neoplasm of breast: Secondary | ICD-10-CM

## 2016-11-14 NOTE — Telephone Encounter (Signed)
Patient LVM, she lost her paperwork for prepping for her colonoscopy can you please mail them to her.

## 2016-11-15 NOTE — Telephone Encounter (Signed)
Thank you, I've called her to give her the colonoscopy instructions. I didn't think it would get to her in time in the mail- during our discussion she found the instruction sheet.

## 2016-11-20 ENCOUNTER — Ambulatory Visit: Payer: Medicare Other | Admitting: Anesthesiology

## 2016-11-20 ENCOUNTER — Encounter
Admission: RE | Disposition: A | Discharge status: Home / Self Care | Payer: Self-pay | Source: Ambulatory Visit | Attending: Gastroenterology

## 2016-11-20 ENCOUNTER — Ambulatory Visit
Admission: RE | Admit: 2016-11-20 | Discharge: 2016-11-20 | Disposition: A | Discharge status: Home / Self Care | Payer: Medicare Other | Source: Ambulatory Visit | Attending: Gastroenterology | Admitting: Gastroenterology

## 2016-11-20 DIAGNOSIS — Z961 Presence of intraocular lens: Secondary | ICD-10-CM | POA: Diagnosis not present

## 2016-11-20 DIAGNOSIS — E7800 Pure hypercholesterolemia, unspecified: Secondary | ICD-10-CM | POA: Insufficient documentation

## 2016-11-20 DIAGNOSIS — Z9889 Other specified postprocedural states: Secondary | ICD-10-CM | POA: Insufficient documentation

## 2016-11-20 DIAGNOSIS — Z90710 Acquired absence of both cervix and uterus: Secondary | ICD-10-CM | POA: Diagnosis not present

## 2016-11-20 DIAGNOSIS — Z1211 Encounter for screening for malignant neoplasm of colon: Secondary | ICD-10-CM

## 2016-11-20 DIAGNOSIS — Z86010 Personal history of colonic polyps: Secondary | ICD-10-CM | POA: Insufficient documentation

## 2016-11-20 DIAGNOSIS — G2581 Restless legs syndrome: Secondary | ICD-10-CM | POA: Diagnosis not present

## 2016-11-20 DIAGNOSIS — Z7902 Long term (current) use of antithrombotics/antiplatelets: Secondary | ICD-10-CM | POA: Diagnosis not present

## 2016-11-20 DIAGNOSIS — Z801 Family history of malignant neoplasm of trachea, bronchus and lung: Secondary | ICD-10-CM | POA: Insufficient documentation

## 2016-11-20 DIAGNOSIS — F329 Major depressive disorder, single episode, unspecified: Secondary | ICD-10-CM | POA: Insufficient documentation

## 2016-11-20 DIAGNOSIS — F419 Anxiety disorder, unspecified: Secondary | ICD-10-CM | POA: Insufficient documentation

## 2016-11-20 DIAGNOSIS — Z9849 Cataract extraction status, unspecified eye: Secondary | ICD-10-CM | POA: Diagnosis not present

## 2016-11-20 DIAGNOSIS — Z7951 Long term (current) use of inhaled steroids: Secondary | ICD-10-CM | POA: Insufficient documentation

## 2016-11-20 DIAGNOSIS — G629 Polyneuropathy, unspecified: Secondary | ICD-10-CM | POA: Diagnosis not present

## 2016-11-20 DIAGNOSIS — K640 First degree hemorrhoids: Secondary | ICD-10-CM | POA: Diagnosis not present

## 2016-11-20 DIAGNOSIS — Z79890 Hormone replacement therapy (postmenopausal): Secondary | ICD-10-CM | POA: Insufficient documentation

## 2016-11-20 DIAGNOSIS — G4730 Sleep apnea, unspecified: Secondary | ICD-10-CM | POA: Insufficient documentation

## 2016-11-20 DIAGNOSIS — K5730 Diverticulosis of large intestine without perforation or abscess without bleeding: Secondary | ICD-10-CM | POA: Insufficient documentation

## 2016-11-20 DIAGNOSIS — Z8249 Family history of ischemic heart disease and other diseases of the circulatory system: Secondary | ICD-10-CM | POA: Insufficient documentation

## 2016-11-20 DIAGNOSIS — K635 Polyp of colon: Secondary | ICD-10-CM | POA: Insufficient documentation

## 2016-11-20 DIAGNOSIS — Z79899 Other long term (current) drug therapy: Secondary | ICD-10-CM | POA: Insufficient documentation

## 2016-11-20 DIAGNOSIS — K219 Gastro-esophageal reflux disease without esophagitis: Secondary | ICD-10-CM | POA: Insufficient documentation

## 2016-11-20 DIAGNOSIS — Z8049 Family history of malignant neoplasm of other genital organs: Secondary | ICD-10-CM | POA: Insufficient documentation

## 2016-11-20 DIAGNOSIS — I10 Essential (primary) hypertension: Secondary | ICD-10-CM | POA: Insufficient documentation

## 2016-11-20 DIAGNOSIS — Z955 Presence of coronary angioplasty implant and graft: Secondary | ICD-10-CM | POA: Insufficient documentation

## 2016-11-20 DIAGNOSIS — Z823 Family history of stroke: Secondary | ICD-10-CM | POA: Insufficient documentation

## 2016-11-20 DIAGNOSIS — I252 Old myocardial infarction: Secondary | ICD-10-CM | POA: Insufficient documentation

## 2016-11-20 DIAGNOSIS — F17210 Nicotine dependence, cigarettes, uncomplicated: Secondary | ICD-10-CM | POA: Diagnosis not present

## 2016-11-20 DIAGNOSIS — Z85828 Personal history of other malignant neoplasm of skin: Secondary | ICD-10-CM | POA: Insufficient documentation

## 2016-11-20 DIAGNOSIS — Z7982 Long term (current) use of aspirin: Secondary | ICD-10-CM | POA: Diagnosis not present

## 2016-11-20 HISTORY — PX: COLONOSCOPY WITH PROPOFOL: SHX5780

## 2016-11-20 HISTORY — PX: POLYPECTOMY: SHX5525

## 2016-11-20 SURGERY — COLONOSCOPY WITH PROPOFOL
Anesthesia: Monitor Anesthesia Care | Wound class: Contaminated

## 2016-11-20 MED ORDER — STERILE WATER FOR IRRIGATION IR SOLN
Status: DC | PRN
  Administered 2016-11-20: 11:00:00

## 2016-11-20 MED ORDER — SODIUM CHLORIDE 0.9 % IV SOLN
INTRAVENOUS | Status: DC
Start: 2016-11-20 — End: 2016-11-20

## 2016-11-20 MED ORDER — LACTATED RINGERS IV SOLN
1000.0000 mL | INTRAVENOUS | Status: DC
  Administered 2016-11-20: 10:00:00 via INTRAVENOUS

## 2016-11-20 MED ORDER — PROPOFOL 10 MG/ML IV BOLUS
INTRAVENOUS | Status: DC | PRN
  Administered 2016-11-20: 20 mg via INTRAVENOUS
  Administered 2016-11-20: 100 mg via INTRAVENOUS
  Administered 2016-11-20 (×3): 50 mg via INTRAVENOUS

## 2016-11-20 MED ORDER — LIDOCAINE HCL (CARDIAC) 20 MG/ML IV SOLN
INTRAVENOUS | Status: DC | PRN
  Administered 2016-11-20: 50 mg via INTRAVENOUS

## 2016-11-20 SURGICAL SUPPLY — 23 items

## 2016-11-20 NOTE — Anesthesia Preprocedure Evaluation (Signed)
Anesthesia Evaluation  Patient identified by MRN, date of birth, ID band Patient awake    Airway Mallampati: II  TM Distance: >3 FB Neck ROM: Full    Dental  (+) Edentulous Upper,    Pulmonary sleep apnea , Current Smoker,    Pulmonary exam normal breath sounds clear to auscultation       Cardiovascular hypertension, + Past MI and + Cardiac Stents (2008)  Normal cardiovascular exam Rhythm:Regular Rate:Normal     Neuro/Psych Anxiety Depression negative neurological ROS     GI/Hepatic Neg liver ROS, GERD  ,  Endo/Other  negative endocrine ROS  Renal/GU negative Renal ROS  negative genitourinary   Musculoskeletal negative musculoskeletal ROS (+)   Abdominal Normal abdominal exam  (+)  Abdomen: soft.    Peds  Hematology negative hematology ROS (+)   Anesthesia Other Findings   Reproductive/Obstetrics negative OB ROS                             Anesthesia Physical Anesthesia Plan  ASA: III  Anesthesia Plan: MAC   Post-op Pain Management:    Induction:   Airway Management Planned: Mask  Additional Equipment: None  Intra-op Plan:   Post-operative Plan:   Informed Consent: I have reviewed the patients History and Physical, chart, labs and discussed the procedure including the risks, benefits and alternatives for the proposed anesthesia with the patient or authorized representative who has indicated his/her understanding and acceptance.     Plan Discussed with:   Anesthesia Plan Comments:         Anesthesia Quick Evaluation

## 2016-11-20 NOTE — Anesthesia Postprocedure Evaluation (Signed)
Anesthesia Post Note  Patient: Karen [name]  Procedure(s) Performed: Procedure(s) (LRB): COLONOSCOPY WITH PROPOFOL (N/A) POLYPECTOMY  Patient location during evaluation: PACU Anesthesia Type: MAC Level of consciousness: awake Pain management: pain level controlled Vital Signs Assessment: post-procedure vital signs reviewed and stable Respiratory status: spontaneous breathing Cardiovascular status: blood pressure returned to baseline Postop Assessment: no headache Anesthetic complications: no    Lavonna Monarch

## 2016-11-20 NOTE — Anesthesia Procedure Notes (Signed)
Procedure Name: MAC Date/Time: 11/20/2016 10:21 AM Performed by: Janna Arch Pre-anesthesia Checklist: Patient identified, Emergency Drugs available, Suction available and Patient being monitored Patient Re-evaluated:Patient Re-evaluated prior to inductionOxygen Delivery Method: Nasal cannula

## 2016-11-20 NOTE — Transfer of Care (Signed)
Immediate Anesthesia Transfer of Care Note  Patient: Karen [name]  Procedure(s) Performed: Procedure(s) with comments: COLONOSCOPY WITH PROPOFOL (N/A) - sleep apnea POLYPECTOMY  Patient Location: PACU  Anesthesia Type: MAC  Level of Consciousness: awake, alert  and patient cooperative  Airway and Oxygen Therapy: Patient Spontanous Breathing and Patient connected to supplemental oxygen  Post-op Assessment: Post-op Vital signs reviewed, Patient's Cardiovascular Status Stable, Respiratory Function Stable, Patent Airway and No signs of Nausea or vomiting  Post-op Vital Signs: Reviewed and stable  Complications: No apparent anesthesia complications

## 2016-11-20 NOTE — Op Note (Signed)
[facility] Gastroenterology Patient Name: Karen [name] Procedure Date: 11/20/2016 10:18 AM MRN: [id number] Account #: [id number] Date of Birth: October 25, 1945 Admit Type: Outpatient Age: 71 Room: [facility] OR ROOM 01 Gender: Female Note Status: Finalized Procedure:            Colonoscopy Indications:          Screening for colorectal malignant neoplasm Providers:            Lucilla Lame MD, MD Medicines:            Propofol per Anesthesia Complications:        No immediate complications. Procedure:            Pre-Anesthesia Assessment:                       - Prior to the procedure, a History and Physical was                        performed, and patient medications and allergies were                        reviewed. The patient's tolerance of previous                        anesthesia was also reviewed. The risks and benefits of                        the procedure and the sedation options and risks were                        discussed with the patient. All questions were                        answered, and informed consent was obtained. Prior                        Anticoagulants: The patient has taken no previous                        anticoagulant or antiplatelet agents. ASA Grade                        Assessment: II - A patient with mild systemic disease.                        After reviewing the risks and benefits, the patient was                        deemed in satisfactory condition to undergo the                        procedure.                       After obtaining informed consent, the colonoscope was                        passed under direct vision. Throughout the procedure,                        the patient's blood pressure,  pulse, and oxygen                        saturations were monitored continuously. The [place] (S#: [id number]) was introduced through                        the anus and advanced to the  the cecum, identified by                        appendiceal orifice and ileocecal valve. The                        colonoscopy was performed without difficulty. The                        patient tolerated the procedure well. The quality of                        the bowel preparation was excellent. Findings:      The perianal and digital rectal examinations were normal.      Two sessile polyps were found in the sigmoid colon. The polyps were 1 to       2 mm in size. These polyps were removed with a cold biopsy forceps.       Resection and retrieval were complete.      Multiple small-mouthed diverticula were found in the entire colon.      Non-bleeding internal hemorrhoids were found during retroflexion. The       hemorrhoids were Grade I (internal hemorrhoids that do not prolapse). Impression:           - Two 1 to 2 mm polyps in the sigmoid colon, removed                        with a cold biopsy forceps. Resected and retrieved.                       - Diverticulosis in the entire examined colon.                       - Non-bleeding internal hemorrhoids. Recommendation:       - Discharge patient to home.                       - Resume previous diet.                       - Continue present medications.                       - Await pathology results.                       - Repeat colonoscopy in 5 years for surveillance. Procedure Code(s):    --- Professional ---                       [phone], Colonoscopy, flexible; with biopsy, single or  multiple Diagnosis Code(s):    --- Professional ---                       Z12.11, Encounter for screening for malignant neoplasm                        of colon                       D12.5, Benign neoplasm of sigmoid colon CPT copyright 2016 American Medical Association. All rights reserved. The codes documented in this report are preliminary and upon coder review may  be revised to meet current compliance requirements. Lucilla Lame MD, MD 11/20/2016 10:40:49 AM This report has been signed electronically. Number of Addenda: 0 Note Initiated On: 11/20/2016 10:18 AM Scope Withdrawal Time: 0 hours 5 minutes 58 seconds  Total Procedure Duration: 0 hours 8 minutes 18 seconds       [facility]

## 2016-11-20 NOTE — H&P (Signed)
Lucilla Lame, MD [facility] [address] [place], [place] [address] Phone:[phone] Fax : [phone]  Primary Care Physician:  Pcp Not In System Primary Gastroenterologist:  Dr. Allen Norris  Pre-Procedure History & Physical: HPI:  Wilsie Toni Arthurs is a 71 y.o. female is here for an colonoscopy.   Past Medical History:  Diagnosis Date  . Anxiety   . Back pain   . Bronchitis   . Cancer ([place])    Skin Cancer  . Depression   . GERD (gastroesophageal reflux disease)   . Hypercholesteremia   . Hyperlipemia   . Hypertension   . Myocardial infarction ([place]) 1999  . Neuropathy   . Restless leg syndrome   . Sleep apnea    use C-PAP  . Wears dentures    full upper, partial lower.  doesn't currently wear    Past Surgical History:  Procedure Laterality Date  . CORONARY ANGIOPLASTY WITH STENT PLACEMENT    . DILATION AND CURETTAGE OF UTERUS    . EYE SURGERY Bilateral    Cataract Extraction with IOL  . TUBAL LIGATION    . VAGINAL HYSTERECTOMY    . VULVECTOMY Right 01/31/2016   Procedure: WIDE EXCISION VULVECTOMY-RIGHT LABIA;  Surgeon: Brayton Mars, MD;  Location: [facility] ORS;  Service: Gynecology;  Laterality: Right;    Prior to Admission medications   Medication Sig Start Date End Date Taking? Authorizing Provider  albuterol (PROVENTIL HFA;VENTOLIN HFA) 108 (90 BASE) MCG/ACT inhaler Inhale 2 puffs into the lungs every 4 (four) hours as needed for wheezing or shortness of breath. 07/06/15  Yes Roderic Palau D Cuthriell, PA-C  ALPRAZolam (XANAX) 0.25 MG tablet Take 0.25 mg by mouth at bedtime as needed for anxiety.   Yes Historical Provider, MD  amLODipine (NORVASC) 10 MG tablet Take 10 mg by mouth daily.   Yes Historical Provider, MD  aspirin EC 81 MG tablet Take 81 mg by mouth daily. Last taken yesterday   Yes Historical Provider, MD  Cholecalciferol (VITAMIN D3) 5000 units TABS Take 5,000 Units by mouth daily.   Yes Historical Provider, MD  clopidogrel (PLAVIX) 75 MG tablet  Take 75 mg by mouth daily.    Yes Historical Provider, MD  dexlansoprazole (DEXILANT) 60 MG capsule Take 1 capsule by mouth daily.   Yes Historical Provider, MD  DULoxetine (CYMBALTA) 30 MG capsule  07/11/16  Yes Historical Provider, MD  furosemide (LASIX) 20 MG tablet Take 20 mg by mouth 2 (two) times daily.    Yes Historical Provider, MD  gabapentin (NEURONTIN) 400 MG capsule Take 400 mg by mouth 3 (three) times daily.    Yes Historical Provider, MD  lisinopril (PRINIVIL,ZESTRIL) 10 MG tablet Take 10 mg by mouth daily.   Yes Historical Provider, MD  metoprolol succinate (TOPROL-XL) 100 MG 24 hr tablet Take 100 mg by mouth daily.   Yes Historical Provider, MD  potassium chloride (MICRO-K) 10 MEQ CR capsule Take 10 mEq by mouth daily.    Yes Historical Provider, MD  simvastatin (ZOCOR) 40 MG tablet Take 40 mg by mouth daily at 6 PM.    Yes Historical Provider, MD  traMADol (ULTRAM) 50 MG tablet Take 1 tablet (50 mg total) by mouth every 6 (six) hours as needed. Patient taking differently: Take 50 mg by mouth every 6 (six) hours as needed for moderate pain.  12/01/14  Yes III [name], PA-C  TRAZODONE HCL PO Take by mouth.   Yes Historical Provider, MD  conjugated estrogens (PREMARIN) vaginal cream  Place 6.29 Applicatorfuls vaginally daily. 1/2 gram twice weekly Patient not taking: Reported on 08/30/2016 11/29/15   Alanda Slim Defrancesco, MD  nitroGLYCERIN (NITROSTAT) 0.4 MG SL tablet Place 1 tablet under the tongue every 5 (five) minutes as needed. Reported on 01/31/2016    Historical Provider, MD    Allergies as of 10/18/2016 - Review Complete 09/20/2016  Allergen Reaction Noted  . Celebrex [celecoxib] Itching 12/01/2014  . Requip [ropinirole hcl] Other (See Comments) 12/01/2014  . Macrobid [nitrofurantoin monohyd macro] Palpitations 12/01/2014  . Nitrofurantoin Palpitations 11/10/2015    Family History  Problem Relation Age of Onset  . Heart disease Father   . Stroke Mother   .  Hypertension Mother   . Lung cancer Brother   . Heart attack Brother   . Congestive Heart Failure Sister   . Cervical cancer Sister   . Cancer Neg Hx   . Diabetes Neg Hx   . Breast cancer Neg Hx     Social History   Social History  . Marital status: Single    Spouse name: N/A  . Number of children: N/A  . Years of education: N/A   Occupational History  . Not on file.   Social History Main Topics  . Smoking status: Current Every Day Smoker    Packs/day: 0.25    Years: 40.00    Types: Cigarettes  . Smokeless tobacco: Current User    Types: Snuff  . Alcohol use No  . Drug use: No  . Sexual activity: Yes    Partners: Male   Other Topics Concern  . Not on file   Social History Narrative  . No narrative on file    Review of Systems: See HPI, otherwise negative ROS  Physical Exam: BP (!) 152/58   Pulse (!) 52   Temp 97.5 F (36.4 C) (Temporal)   Resp 16   Ht '5\' 3"'$  (1.6 m)   Wt 157 lb (71.2 kg)   SpO2 97%   BMI 27.81 kg/m  General:   Alert,  pleasant and cooperative in NAD Head:  Normocephalic and atraumatic. Neck:  Supple; no masses or thyromegaly. Lungs:  Clear throughout to auscultation.    Heart:  Regular rate and rhythm. Abdomen:  Soft, nontender and nondistended. Normal bowel sounds, without guarding, and without rebound.   Neurologic:  Alert and  oriented x4;  grossly normal neurologically.  Impression/Plan: Macall Toni Arthurs is here for an colonoscopy to be performed for history of colon polyps  Risks, benefits, limitations, and alternatives regarding  colonoscopy have been reviewed with the patient.  Questions have been answered.  All parties agreeable.   Lucilla Lame, MD  11/20/2016, 10:17 AM

## 2016-11-21 ENCOUNTER — Encounter: Payer: Self-pay | Admitting: Gastroenterology

## 2016-11-22 ENCOUNTER — Encounter: Payer: Self-pay | Admitting: Gastroenterology

## 2016-11-22 DIAGNOSIS — D696 Thrombocytopenia, unspecified: Secondary | ICD-10-CM | POA: Diagnosis not present

## 2016-11-22 DIAGNOSIS — Z0000 Encounter for general adult medical examination without abnormal findings: Secondary | ICD-10-CM | POA: Diagnosis not present

## 2016-11-22 DIAGNOSIS — R7303 Prediabetes: Secondary | ICD-10-CM | POA: Diagnosis not present

## 2016-11-22 DIAGNOSIS — I10 Essential (primary) hypertension: Secondary | ICD-10-CM | POA: Diagnosis not present

## 2016-11-22 DIAGNOSIS — M545 Low back pain: Secondary | ICD-10-CM | POA: Diagnosis not present

## 2016-11-22 DIAGNOSIS — G6289 Other specified polyneuropathies: Secondary | ICD-10-CM | POA: Diagnosis not present

## 2016-11-22 DIAGNOSIS — G9009 Other idiopathic peripheral autonomic neuropathy: Secondary | ICD-10-CM | POA: Diagnosis not present

## 2016-11-23 ENCOUNTER — Encounter: Payer: Self-pay | Admitting: Gastroenterology

## 2016-11-28 DIAGNOSIS — L570 Actinic keratosis: Secondary | ICD-10-CM | POA: Diagnosis not present

## 2016-12-14 ENCOUNTER — Encounter: Payer: Self-pay | Admitting: Obstetrics and Gynecology

## 2016-12-14 ENCOUNTER — Ambulatory Visit (INDEPENDENT_AMBULATORY_CARE_PROVIDER_SITE_OTHER): Payer: Medicare Other | Admitting: Obstetrics and Gynecology

## 2016-12-14 ENCOUNTER — Encounter: Payer: Medicare Other | Admitting: Obstetrics and Gynecology

## 2016-12-14 VITALS — BP 116/61 | HR 57 | Ht 63.0 in | Wt 164.3 lb

## 2016-12-14 DIAGNOSIS — B009 Herpesviral infection, unspecified: Secondary | ICD-10-CM | POA: Diagnosis not present

## 2016-12-14 DIAGNOSIS — N895 Stricture and atresia of vagina: Secondary | ICD-10-CM | POA: Diagnosis not present

## 2016-12-14 DIAGNOSIS — N941 Unspecified dyspareunia: Secondary | ICD-10-CM | POA: Diagnosis not present

## 2016-12-14 DIAGNOSIS — Z90710 Acquired absence of both cervix and uterus: Secondary | ICD-10-CM

## 2016-12-14 DIAGNOSIS — N898 Other specified noninflammatory disorders of vagina: Secondary | ICD-10-CM | POA: Diagnosis not present

## 2016-12-14 DIAGNOSIS — N907 Vulvar cyst: Secondary | ICD-10-CM | POA: Diagnosis not present

## 2016-12-14 DIAGNOSIS — Z1239 Encounter for other screening for malignant neoplasm of breast: Secondary | ICD-10-CM

## 2016-12-14 DIAGNOSIS — Z124 Encounter for screening for malignant neoplasm of cervix: Secondary | ICD-10-CM | POA: Diagnosis not present

## 2016-12-14 DIAGNOSIS — N952 Postmenopausal atrophic vaginitis: Secondary | ICD-10-CM | POA: Diagnosis not present

## 2016-12-14 MED ORDER — ESTROGENS, CONJUGATED 0.625 MG/GM VA CREA: 0.2500 | TOPICAL_CREAM | Freq: Every day | VAGINAL | 12 refills | Status: DC

## 2016-12-14 NOTE — Patient Instructions (Signed)
1. No Pap smear is needed 2. Mammogram is already scheduled 3. Stool guaiac cards are not given because patient has already had screening colonoscopy this year 4. Begin Premarin cream intravaginal twice a week 5. Continue with calcium and vitamin D supplementation twice a day 6. Continue with healthy eating and exercise 7. Return in 1 year for follow-up   Health Maintenance for Postmenopausal Women Menopause is a normal process in which your reproductive ability comes to an end. This process happens gradually over a span of months to years, usually between the ages of 36 and 29. Menopause is complete when you have missed 12 consecutive menstrual periods. It is important to talk with your health care provider about some of the most common conditions that affect postmenopausal women, such as heart disease, cancer, and bone loss (osteoporosis). Adopting a healthy lifestyle and getting preventive care can help to promote your health and wellness. Those actions can also lower your chances of developing some of these common conditions. What should I know about menopause? During menopause, you may experience a number of symptoms, such as:  Moderate-to-severe hot flashes.  Night sweats.  Decrease in sex drive.  Mood swings.  Headaches.  Tiredness.  Irritability.  Memory problems.  Insomnia. Choosing to treat or not to treat menopausal changes is an individual decision that you make with your health care provider. What should I know about hormone replacement therapy and supplements? Hormone therapy products are effective for treating symptoms that are associated with menopause, such as hot flashes and night sweats. Hormone replacement carries certain risks, especially as you become older. If you are thinking about using estrogen or estrogen with progestin treatments, discuss the benefits and risks with your health care provider. What should I know about heart disease and stroke? Heart  disease, heart attack, and stroke become more likely as you age. This may be due, in part, to the hormonal changes that your body experiences during menopause. These can affect how your body processes dietary fats, triglycerides, and cholesterol. Heart attack and stroke are both medical emergencies. There are many things that you can do to help prevent heart disease and stroke:  Have your blood pressure checked at least every 1-2 years. High blood pressure causes heart disease and increases the risk of stroke.  If you are 36-21 years old, ask your health care provider if you should take aspirin to prevent a heart attack or a stroke.  Do not use any tobacco products, including cigarettes, chewing tobacco, or electronic cigarettes. If you need help quitting, ask your health care provider.  It is important to eat a healthy diet and maintain a healthy weight.  Be sure to include plenty of vegetables, fruits, low-fat dairy products, and lean protein.  Avoid eating foods that are high in solid fats, added sugars, or salt (sodium).  Get regular exercise. This is one of the most important things that you can do for your health.  Try to exercise for at least 150 minutes each week. The type of exercise that you do should increase your heart rate and make you sweat. This is known as moderate-intensity exercise.  Try to do strengthening exercises at least twice each week. Do these in addition to the moderate-intensity exercise.  Know your numbers.Ask your health care provider to check your cholesterol and your blood glucose. Continue to have your blood tested as directed by your health care provider. What should I know about cancer screening? There are several types of cancer. Take  the following steps to reduce your risk and to catch any cancer development as early as possible. Breast Cancer  Practice breast self-awareness.  This means understanding how your breasts normally appear and feel.  It  also means doing regular breast self-exams. Let your health care provider know about any changes, no matter how small.  If you are 78 or older, have a clinician do a breast exam (clinical breast exam or CBE) every year. Depending on your age, family history, and medical history, it may be recommended that you also have a yearly breast X-ray (mammogram).  If you have a family history of breast cancer, talk with your health care provider about genetic screening.  If you are at high risk for breast cancer, talk with your health care provider about having an MRI and a mammogram every year.  Breast cancer (BRCA) gene test is recommended for women who have family members with BRCA-related cancers. Results of the assessment will determine the need for genetic counseling and BRCA1 and for BRCA2 testing. BRCA-related cancers include these types:  Breast. This occurs in males or females.  Ovarian.  Tubal. This may also be called fallopian tube cancer.  Cancer of the abdominal or pelvic lining (peritoneal cancer).  Prostate.  Pancreatic. Cervical, Uterine, and Ovarian Cancer  Your health care provider may recommend that you be screened regularly for cancer of the pelvic organs. These include your ovaries, uterus, and vagina. This screening involves a pelvic exam, which includes checking for microscopic changes to the surface of your cervix (Pap test).  For women ages 21-65, health care providers may recommend a pelvic exam and a Pap test every three years. For women ages 53-65, they may recommend the Pap test and pelvic exam, combined with testing for human papilloma virus (HPV), every five years. Some types of HPV increase your risk of cervical cancer. Testing for HPV may also be done on women of any age who have unclear Pap test results.  Other health care providers may not recommend any screening for nonpregnant women who are considered low risk for pelvic cancer and have no symptoms. Ask your  health care provider if a screening pelvic exam is right for you.  If you have had past treatment for cervical cancer or a condition that could lead to cancer, you need Pap tests and screening for cancer for at least 20 years after your treatment. If Pap tests have been discontinued for you, your risk factors (such as having a new sexual partner) need to be reassessed to determine if you should start having screenings again. Some women have medical problems that increase the chance of getting cervical cancer. In these cases, your health care provider may recommend that you have screening and Pap tests more often.  If you have a family history of uterine cancer or ovarian cancer, talk with your health care provider about genetic screening.  If you have vaginal bleeding after reaching menopause, tell your health care provider.  There are currently no reliable tests available to screen for ovarian cancer. Lung Cancer  Lung cancer screening is recommended for adults 16-40 years old who are at high risk for lung cancer because of a history of smoking. A yearly low-dose CT scan of the lungs is recommended if you:  Currently smoke.  Have a history of at least 30 pack-years of smoking and you currently smoke or have quit within the past 15 years. A pack-year is smoking an average of one pack of cigarettes per  day for one year. Yearly screening should:  Continue until it has been 15 years since you quit.  Stop if you develop a health problem that would prevent you from having lung cancer treatment. Colorectal Cancer  This type of cancer can be detected and can often be prevented.  Routine colorectal cancer screening usually begins at age 60 and continues through age 70.  If you have risk factors for colon cancer, your health care provider may recommend that you be screened at an earlier age.  If you have a family history of colorectal cancer, talk with your health care provider about genetic  screening.  Your health care provider may also recommend using home test kits to check for hidden blood in your stool.  A small camera at the end of a tube can be used to examine your colon directly (sigmoidoscopy or colonoscopy). This is done to check for the earliest forms of colorectal cancer.  Direct examination of the colon should be repeated every 5-10 years until age 33. However, if early forms of precancerous polyps or small growths are found or if you have a family history or genetic risk for colorectal cancer, you may need to be screened more often. Skin Cancer  Check your skin from head to toe regularly.  Monitor any moles. Be sure to tell your health care provider:  About any new moles or changes in moles, especially if there is a change in a mole's shape or color.  If you have a mole that is larger than the size of a pencil eraser.  If any of your family members has a history of skin cancer, especially at a young age, talk with your health care provider about genetic screening.  Always use sunscreen. Apply sunscreen liberally and repeatedly throughout the day.  Whenever you are outside, protect yourself by wearing long sleeves, pants, a wide-brimmed hat, and sunglasses. What should I know about osteoporosis? Osteoporosis is a condition in which bone destruction happens more quickly than new bone creation. After menopause, you may be at an increased risk for osteoporosis. To help prevent osteoporosis or the bone fractures that can happen because of osteoporosis, the following is recommended:  If you are 48-84 years old, get at least 1,000 mg of calcium and at least 600 mg of vitamin D per day.  If you are older than age 50 but younger than age 103, get at least 1,200 mg of calcium and at least 600 mg of vitamin D per day.  If you are older than age 67, get at least 1,200 mg of calcium and at least 800 mg of vitamin D per day. Smoking and excessive alcohol intake increase the  risk of osteoporosis. Eat foods that are rich in calcium and vitamin D, and do weight-bearing exercises several times each week as directed by your health care provider. What should I know about how menopause affects my mental health? Depression may occur at any age, but it is more common as you become older. Common symptoms of depression include:  Low or sad mood.  Changes in sleep patterns.  Changes in appetite or eating patterns.  Feeling an overall lack of motivation or enjoyment of activities that you previously enjoyed.  Frequent crying spells. Talk with your health care provider if you think that you are experiencing depression. What should I know about immunizations? It is important that you get and maintain your immunizations. These include:  Tetanus, diphtheria, and pertussis (Tdap) booster vaccine.  Influenza every  year before the flu season begins.  Pneumonia vaccine.  Shingles vaccine. Your health care provider may also recommend other immunizations. This information is not intended to replace advice given to you by your health care provider. Make sure you discuss any questions you have with your health care provider. Document Released: 09/01/2005 Document Revised: 01/28/2016 Document Reviewed: 04/13/2015 Elsevier Interactive Patient Education  2017 [employer].

## 2016-12-14 NOTE — Progress Notes (Signed)
ANNUAL PREVENTATIVE CARE GYN  ENCOUNTER NOTE  Subjective:       Karen [name] is a 71 y.o. [id number] female here for a routine annual gynecologic exam.  Current complaints: 1.  Bumps on vaginal area   History of vulvar mass, status post wide local excision on 85/92/9244; pathology-follicular cyst, infundibular type (epidermal inclusion cyst) Patient reports vaginal dryness and dyspareunia. She is wanting to start using Premarin vaginal cream Patient does report occasional vasomotor symptoms. Bowel function is normal. Bladder function is normal. Patient is status post colonoscopy this year with findings of a polyp.   Gynecologic History No LMP recorded. Patient has had a total vaginal hysterectomy. Contraception: status post hysterectomy status post TVH Last Pap: ?Marland Kitchen Results were: normal Last mammogram: 2017. Results were: normal History of recurrent genital herpes  Obstetric History OB History  Gravida Para Term Preterm AB Living  8 5 5   3 5   SAB TAB Ectopic Multiple Live Births  3       5    # Outcome Date GA Lbr Len/2nd Weight Sex Delivery Anes PTL Lv  8 Term 4    M Vag-Spont   LIV  7 Term 22    F Vag-Spont   LIV  6 Term 56    F Vag-Spont   LIV  5 Term 77    F Vag-Spont   LIV  4 Term 62    M Vag-Spont   LIV  3 SAB           2 SAB           1 SAB               Past Medical History:  Diagnosis Date  . Anxiety   . Back pain   . Bronchitis   . Cancer ([place])    Skin Cancer  . Depression   . GERD (gastroesophageal reflux disease)   . Hypercholesteremia   . Hyperlipemia   . Hypertension   . Myocardial infarction ([place]) 1999  . Neuropathy   . Restless leg syndrome   . Sleep apnea    use C-PAP  . Wears dentures    full upper, partial lower.  doesn't currently wear    Past Surgical History:  Procedure Laterality Date  . COLONOSCOPY WITH PROPOFOL N/A 11/20/2016   Procedure: COLONOSCOPY WITH PROPOFOL;  Surgeon: Lucilla Lame, MD;  Location: [facility];  Service: Endoscopy;  Laterality: N/A;  sleep apnea  . CORONARY ANGIOPLASTY WITH STENT PLACEMENT    . DILATION AND CURETTAGE OF UTERUS    . EYE SURGERY Bilateral    Cataract Extraction with IOL  . POLYPECTOMY  11/20/2016   Procedure: POLYPECTOMY;  Surgeon: Lucilla Lame, MD;  Location: [facility];  Service: Endoscopy;;  . TUBAL LIGATION    . VAGINAL HYSTERECTOMY    . VULVECTOMY Right 01/31/2016   Procedure: WIDE EXCISION VULVECTOMY-RIGHT LABIA;  Surgeon: Brayton Mars, MD;  Location: [facility];  Service: Gynecology;  Laterality: Right;    Current Outpatient Prescriptions on File Prior to Visit  Medication Sig Dispense Refill  . albuterol (PROVENTIL HFA;VENTOLIN HFA) 108 (90 BASE) MCG/ACT inhaler Inhale 2 puffs into the lungs every 4 (four) hours as needed for wheezing or shortness of breath. 1 Inhaler 0  . ALPRAZolam (XANAX) 0.25 MG tablet Take 0.25 mg by mouth at bedtime as needed for anxiety.    Marland Kitchen amLODipine (NORVASC) 10 MG tablet Take 10 mg by mouth daily.    Marland Kitchen  aspirin EC 81 MG tablet Take 81 mg by mouth daily. Last taken yesterday    . Cholecalciferol (VITAMIN D3) 5000 units TABS Take 5,000 Units by mouth daily.    . clopidogrel (PLAVIX) 75 MG tablet Take 75 mg by mouth daily.     Marland Kitchen conjugated estrogens (PREMARIN) vaginal cream Place 7.10 Applicatorfuls vaginally daily. 1/2 gram twice weekly 42.5 g 12  . dexlansoprazole (DEXILANT) 60 MG capsule Take 1 capsule by mouth daily.    . DULoxetine (CYMBALTA) 30 MG capsule     . furosemide (LASIX) 20 MG tablet Take 20 mg by mouth 2 (two) times daily.     Marland Kitchen gabapentin (NEURONTIN) 400 MG capsule Take 400 mg by mouth 3 (three) times daily.     Marland Kitchen lisinopril (PRINIVIL,ZESTRIL) 10 MG tablet Take 10 mg by mouth daily.    . metoprolol succinate (TOPROL-XL) 100 MG 24 hr tablet Take 100 mg by mouth daily.    . nitroGLYCERIN (NITROSTAT) 0.4 MG SL tablet Place 1 tablet under the tongue every 5 (five) minutes as needed. Reported on  01/31/2016    . potassium chloride (MICRO-K) 10 MEQ CR capsule Take 10 mEq by mouth daily.     . simvastatin (ZOCOR) 40 MG tablet Take 40 mg by mouth daily at 6 PM.     . traMADol (ULTRAM) 50 MG tablet Take 1 tablet (50 mg total) by mouth every 6 (six) hours as needed. (Patient taking differently: Take 50 mg by mouth every 6 (six) hours as needed for moderate pain. ) 12 tablet 0   No current facility-administered medications on file prior to visit.     Allergies  Allergen Reactions  . Celebrex [Celecoxib] Itching  . Meloxicam   . Requip [Ropinirole Hcl] Other (See Comments)    Throat closing  . Macrobid [Nitrofurantoin Monohyd Macro] Palpitations  . Nitrofurantoin Palpitations    Social History   Social History  . Marital status: Single    Spouse name: N/A  . Number of children: N/A  . Years of education: N/A   Occupational History  . Not on file.   Social History Main Topics  . Smoking status: Current Every Day Smoker    Packs/day: 0.25    Years: 40.00    Types: Cigarettes  . Smokeless tobacco: Current User    Types: Snuff  . Alcohol use No  . Drug use: No  . Sexual activity: Yes    Partners: Male   Other Topics Concern  . Not on file   Social History Narrative  . No narrative on file    Family History  Problem Relation Age of Onset  . Heart disease Father   . Stroke Mother   . Hypertension Mother   . Lung cancer Brother   . Heart attack Brother   . Congestive Heart Failure Sister   . Cervical cancer Sister   . Cancer Neg Hx   . Diabetes Neg Hx   . Breast cancer Neg Hx     The following portions of the patient's history were reviewed and updated as appropriate: allergies, current medications, past family history, past medical history, past social history, past surgical history and problem list.  Review of Systems Review of Systems  Constitutional:       Occasional vasomotor symptom  Eyes: Negative.   Respiratory: Negative.   Cardiovascular:  Negative.   Gastrointestinal: Negative.        Bowel function normal  Genitourinary: Negative.  Bladder function normal  Musculoskeletal: Negative.   Skin: Negative.   Neurological: Negative.   Endo/Heme/Allergies: Negative.   Psychiatric/Behavioral: Negative.     Objective:   BP 116/61   Pulse (!) 57   Ht 5\' 3"  (1.6 m)   Wt 164 lb 4.8 oz (74.5 kg)   BMI 29.10 kg/m  CONSTITUTIONAL: Well-developed, well-nourished female in no acute distress.  PSYCHIATRIC: Normal mood and affect. Normal behavior. Normal judgment and thought content. [place]: Alert and oriented to person, place, and time. Normal muscle tone coordination. No cranial nerve deficit noted. HENT:  Normocephalic, atraumatic, External right and left ear normal. Oropharynx is clear and moist EYES: Conjunctivae and EOM are normal.  No scleral icterus.  NECK: Normal range of motion, supple, no masses.  Normal thyroid.  SKIN: Skin is warm and dry. No rash noted. Not diaphoretic. No erythema. No pallor. CARDIOVASCULAR: Normal heart rate noted, regular rhythm, no murmur. RESPIRATORY: Clear to auscultation bilaterally. Effort and breath sounds normal, no problems with respiration noted. BREASTS: Symmetric in size. No masses, skin changes, nipple drainage, or lymphadenopathy. ABDOMEN: Soft, normal bowel sounds, no distention noted.  No tenderness, rebound or guarding.  BLADDER: Normal PELVIC:  External Genitalia: 5 mm pea-sized subcutaneous nodule noted in the right labia majora  BUS: Normal  Vagina: Moderate to severe vaginal atrophy; good vault support; vaginal cuff intact; narrowed introitus limiting single digit exam  Cervix: Surgically absent  Uterus: Surgically absent  Adnexa: Normal; nonpalpable and nontender  RV: External Exam NormaI, No Rectal Masses and Normal Sphincter tone  MUSCULOSKELETAL: Normal range of motion. No tenderness.  No cyanosis, clubbing, or edema.  2+ distal pulses. LYMPHATIC: No Axillary,  Supraclavicular, or Inguinal Adenopathy.    Assessment:   Annual gynecologic examination 71 y.o. Contraception: status post Total vaginal hysterectomy [id number] Menopausal vasomotor symptoms, minimally symptomatic Vaginal atrophy, symptomatic History of vulvar inclusion cyst, status post excision 5 mm pea-sized subcutaneous nodule right labia majora  Plan:  Pap: Not needed Mammogram: scheduled for 12/19/2016 Stool Guaiac Testing:  colonoscopy 10/2016- wnl Labs: pcp Routine preventative health maintenance measures emphasized: Exercise/Diet/Weight control, Tobacco Warnings, Alcohol/Substance use risks, Peer Pressure Issues and Safe Sex Start Premarin cream intravaginal twice weekly Return to Clinic - [address] [place], CMA  Brayton Mars, MD

## 2016-12-15 ENCOUNTER — Other Ambulatory Visit: Payer: Self-pay

## 2016-12-15 MED ORDER — ESTROGENS, CONJUGATED 0.625 MG/GM VA CREA: 0.2500 | TOPICAL_CREAM | VAGINAL | 12 refills | Status: DC

## 2016-12-19 ENCOUNTER — Ambulatory Visit
Admission: RE | Admit: 2016-12-19 | Discharge: 2016-12-19 | Disposition: A | Discharge status: Home / Self Care | Payer: Medicare Other | Source: Ambulatory Visit | Attending: Internal Medicine | Admitting: Internal Medicine

## 2016-12-19 DIAGNOSIS — Z1231 Encounter for screening mammogram for malignant neoplasm of breast: Secondary | ICD-10-CM | POA: Insufficient documentation

## 2017-01-05 DIAGNOSIS — M47816 Spondylosis without myelopathy or radiculopathy, lumbar region: Secondary | ICD-10-CM | POA: Diagnosis not present

## 2017-01-05 DIAGNOSIS — M7061 Trochanteric bursitis, right hip: Secondary | ICD-10-CM | POA: Diagnosis not present

## 2017-01-09 DIAGNOSIS — L570 Actinic keratosis: Secondary | ICD-10-CM | POA: Diagnosis not present

## 2017-02-05 DIAGNOSIS — M2550 Pain in unspecified joint: Secondary | ICD-10-CM | POA: Diagnosis not present

## 2017-02-05 DIAGNOSIS — I10 Essential (primary) hypertension: Secondary | ICD-10-CM | POA: Diagnosis not present

## 2017-02-05 DIAGNOSIS — M545 Low back pain: Secondary | ICD-10-CM | POA: Diagnosis not present

## 2017-03-01 DIAGNOSIS — I10 Essential (primary) hypertension: Secondary | ICD-10-CM | POA: Diagnosis not present

## 2017-03-01 DIAGNOSIS — N951 Menopausal and female climacteric states: Secondary | ICD-10-CM | POA: Diagnosis not present

## 2017-03-08 DIAGNOSIS — R05 Cough: Secondary | ICD-10-CM | POA: Diagnosis not present

## 2017-03-13 DIAGNOSIS — M818 Other osteoporosis without current pathological fracture: Secondary | ICD-10-CM | POA: Diagnosis not present

## 2017-03-13 DIAGNOSIS — Z13820 Encounter for screening for osteoporosis: Secondary | ICD-10-CM | POA: Diagnosis not present

## 2017-03-20 DIAGNOSIS — H43813 Vitreous degeneration, bilateral: Secondary | ICD-10-CM | POA: Diagnosis not present

## 2017-06-22 ENCOUNTER — Other Ambulatory Visit: Payer: Self-pay | Admitting: Cardiovascular Disease

## 2017-06-22 DIAGNOSIS — I200 Unstable angina: Secondary | ICD-10-CM | POA: Insufficient documentation

## 2017-06-25 ENCOUNTER — Ambulatory Visit: Payer: Self-pay | Admitting: Podiatry

## 2017-06-26 ENCOUNTER — Other Ambulatory Visit: Payer: Self-pay | Admitting: Internal Medicine

## 2017-06-26 DIAGNOSIS — R1011 Right upper quadrant pain: Secondary | ICD-10-CM

## 2017-06-29 ENCOUNTER — Encounter
Admission: RE | Disposition: A | Discharge status: Home / Self Care | Payer: Self-pay | Source: Ambulatory Visit | Attending: Cardiovascular Disease

## 2017-06-29 ENCOUNTER — Ambulatory Visit
Admission: RE | Admit: 2017-06-29 | Discharge: 2017-06-29 | Disposition: A | Discharge status: Home / Self Care | Payer: Medicare Other | Source: Ambulatory Visit | Attending: Cardiovascular Disease | Admitting: Cardiovascular Disease

## 2017-06-29 ENCOUNTER — Encounter: Payer: Self-pay | Admitting: *Deleted

## 2017-06-29 DIAGNOSIS — M810 Age-related osteoporosis without current pathological fracture: Secondary | ICD-10-CM | POA: Insufficient documentation

## 2017-06-29 DIAGNOSIS — E785 Hyperlipidemia, unspecified: Secondary | ICD-10-CM | POA: Insufficient documentation

## 2017-06-29 DIAGNOSIS — Z79899 Other long term (current) drug therapy: Secondary | ICD-10-CM | POA: Insufficient documentation

## 2017-06-29 DIAGNOSIS — G629 Polyneuropathy, unspecified: Secondary | ICD-10-CM | POA: Diagnosis not present

## 2017-06-29 DIAGNOSIS — Z7902 Long term (current) use of antithrombotics/antiplatelets: Secondary | ICD-10-CM | POA: Diagnosis not present

## 2017-06-29 DIAGNOSIS — I10 Essential (primary) hypertension: Secondary | ICD-10-CM | POA: Insufficient documentation

## 2017-06-29 DIAGNOSIS — I252 Old myocardial infarction: Secondary | ICD-10-CM | POA: Diagnosis not present

## 2017-06-29 DIAGNOSIS — G4733 Obstructive sleep apnea (adult) (pediatric): Secondary | ICD-10-CM | POA: Insufficient documentation

## 2017-06-29 DIAGNOSIS — I200 Unstable angina: Secondary | ICD-10-CM

## 2017-06-29 DIAGNOSIS — I25110 Atherosclerotic heart disease of native coronary artery with unstable angina pectoris: Secondary | ICD-10-CM | POA: Diagnosis present

## 2017-06-29 DIAGNOSIS — Z955 Presence of coronary angioplasty implant and graft: Secondary | ICD-10-CM | POA: Insufficient documentation

## 2017-06-29 HISTORY — DX: Dyspnea, unspecified: R06.00

## 2017-06-29 HISTORY — PX: LEFT HEART CATH AND CORONARY ANGIOGRAPHY: CATH118249

## 2017-06-29 HISTORY — DX: Atherosclerotic heart disease of native coronary artery without angina pectoris: I25.10

## 2017-06-29 SURGERY — LEFT HEART CATH AND CORONARY ANGIOGRAPHY
Anesthesia: Moderate Sedation

## 2017-06-29 MED ORDER — LIDOCAINE HCL (PF) 1 % IJ SOLN
INTRAMUSCULAR | Status: AC
  Filled 2017-06-29: qty 30

## 2017-06-29 MED ORDER — ACETAMINOPHEN 325 MG PO TABS: 650.0000 mg | ORAL_TABLET | ORAL | Status: DC | PRN

## 2017-06-29 MED ORDER — IOPAMIDOL (ISOVUE-300) INJECTION 61%
INTRAVENOUS | Status: DC | PRN
  Administered 2017-06-29: 100 mL via INTRA_ARTERIAL

## 2017-06-29 MED ORDER — SODIUM CHLORIDE 0.9% FLUSH: 3.0000 mL | Freq: Two times a day (BID) | INTRAVENOUS | Status: DC

## 2017-06-29 MED ORDER — ONDANSETRON HCL 4 MG/2ML IJ SOLN: 4.0000 mg | Freq: Four times a day (QID) | INTRAMUSCULAR | Status: DC | PRN

## 2017-06-29 MED ORDER — FENTANYL CITRATE (PF) 100 MCG/2ML IJ SOLN
INTRAMUSCULAR | Status: DC | PRN
  Administered 2017-06-29 (×2): 25 ug via INTRAVENOUS

## 2017-06-29 MED ORDER — MIDAZOLAM HCL 2 MG/2ML IJ SOLN
INTRAMUSCULAR | Status: AC
  Filled 2017-06-29: qty 2

## 2017-06-29 MED ORDER — HEPARIN (PORCINE) IN NACL 2-0.9 UNIT/ML-% IJ SOLN
INTRAMUSCULAR | Status: AC
  Filled 2017-06-29: qty 1000

## 2017-06-29 MED ORDER — MIDAZOLAM HCL 2 MG/2ML IJ SOLN
INTRAMUSCULAR | Status: DC | PRN
  Administered 2017-06-29 (×2): 1 mg via INTRAVENOUS

## 2017-06-29 MED ORDER — SODIUM CHLORIDE 0.9% FLUSH
3.0000 mL | INTRAVENOUS | Status: DC | PRN
  Administered 2017-06-29: 3 mL via INTRAVENOUS
  Filled 2017-06-29: qty 3

## 2017-06-29 MED ORDER — SODIUM CHLORIDE 0.9 % WEIGHT BASED INFUSION: 1.0000 mL/kg/h | INTRAVENOUS | Status: DC

## 2017-06-29 MED ORDER — ASPIRIN 81 MG PO CHEW
81.0000 mg | CHEWABLE_TABLET | ORAL | Status: AC
  Administered 2017-06-29: 81 mg via ORAL

## 2017-06-29 MED ORDER — SODIUM CHLORIDE 0.9 % WEIGHT BASED INFUSION
3.0000 mL/kg/h | INTRAVENOUS | Status: AC
  Administered 2017-06-29: 3 mL/kg/h via INTRAVENOUS

## 2017-06-29 MED ORDER — ASPIRIN 81 MG PO CHEW
CHEWABLE_TABLET | ORAL | Status: AC
  Filled 2017-06-29: qty 1

## 2017-06-29 MED ORDER — SODIUM CHLORIDE 0.9% FLUSH: 3.0000 mL | INTRAVENOUS | Status: DC | PRN

## 2017-06-29 MED ORDER — SODIUM CHLORIDE 0.9 % IV SOLN: 250.0000 mL | INTRAVENOUS | Status: DC | PRN

## 2017-06-29 MED ORDER — FENTANYL CITRATE (PF) 100 MCG/2ML IJ SOLN
INTRAMUSCULAR | Status: AC
  Filled 2017-06-29: qty 2

## 2017-06-29 SURGICAL SUPPLY — 9 items
CATH INFINITI 5FR ANG PIGTAIL (CATHETERS) ×3 IMPLANT
CATH INFINITI 5FR JL4 (CATHETERS) ×3 IMPLANT
CATH INFINITI JR4 5F (CATHETERS) ×3 IMPLANT
DEVICE CLOSURE MYNXGRIP 5F (Vascular Products) ×3 IMPLANT
GUIDEWIRE 3MM J TIP .035 145 (WIRE) ×3 IMPLANT
KIT MANI 3VAL PERCEP (MISCELLANEOUS) ×3 IMPLANT
NEEDLE PERC 18GX7CM (NEEDLE) ×3 IMPLANT
PACK CARDIAC CATH (CUSTOM PROCEDURE TRAY) ×3 IMPLANT
SHEATH AVANTI 5FR X 11CM (SHEATH) ×3 IMPLANT

## 2017-07-02 ENCOUNTER — Ambulatory Visit: Admission: RE | Admit: 2017-07-02 | Payer: Medicare Other | Source: Ambulatory Visit

## 2017-07-09 ENCOUNTER — Inpatient Hospital Stay: Payer: Medicare Other | Attending: Oncology | Admitting: Oncology

## 2017-07-11 ENCOUNTER — Ambulatory Visit: Payer: Medicare Other

## 2017-07-30 ENCOUNTER — Other Ambulatory Visit: Payer: Self-pay | Admitting: Oncology

## 2017-07-30 ENCOUNTER — Encounter: Payer: Self-pay | Admitting: Oncology

## 2017-07-30 ENCOUNTER — Other Ambulatory Visit: Payer: Self-pay

## 2017-07-30 ENCOUNTER — Inpatient Hospital Stay: Payer: Medicare Other

## 2017-07-30 ENCOUNTER — Inpatient Hospital Stay: Payer: Medicare Other | Attending: Oncology | Admitting: Oncology

## 2017-07-30 ENCOUNTER — Encounter (INDEPENDENT_AMBULATORY_CARE_PROVIDER_SITE_OTHER): Payer: Self-pay

## 2017-07-30 VITALS — BP 134/75 | HR 52 | Resp 16 | Ht 63.0 in | Wt 162.7 lb

## 2017-07-30 DIAGNOSIS — C9110 Chronic lymphocytic leukemia of B-cell type not having achieved remission: Secondary | ICD-10-CM | POA: Diagnosis not present

## 2017-07-30 DIAGNOSIS — D72820 Lymphocytosis (symptomatic): Secondary | ICD-10-CM

## 2017-07-30 DIAGNOSIS — I252 Old myocardial infarction: Secondary | ICD-10-CM | POA: Insufficient documentation

## 2017-07-30 DIAGNOSIS — Z801 Family history of malignant neoplasm of trachea, bronchus and lung: Secondary | ICD-10-CM | POA: Diagnosis not present

## 2017-07-30 DIAGNOSIS — Z803 Family history of malignant neoplasm of breast: Secondary | ICD-10-CM | POA: Insufficient documentation

## 2017-07-30 DIAGNOSIS — G4730 Sleep apnea, unspecified: Secondary | ICD-10-CM

## 2017-07-30 DIAGNOSIS — Z85828 Personal history of other malignant neoplasm of skin: Secondary | ICD-10-CM | POA: Diagnosis not present

## 2017-07-30 DIAGNOSIS — I10 Essential (primary) hypertension: Secondary | ICD-10-CM | POA: Diagnosis not present

## 2017-07-30 DIAGNOSIS — E871 Hypo-osmolality and hyponatremia: Secondary | ICD-10-CM

## 2017-07-30 DIAGNOSIS — F17210 Nicotine dependence, cigarettes, uncomplicated: Secondary | ICD-10-CM | POA: Insufficient documentation

## 2017-07-30 DIAGNOSIS — Z8049 Family history of malignant neoplasm of other genital organs: Secondary | ICD-10-CM | POA: Diagnosis not present

## 2017-07-30 LAB — CBC WITH DIFFERENTIAL/PLATELET
BASOS ABS: 0.1 10*3/uL (ref 0–0.1)
Basophils Relative: 1 %
Eosinophils Absolute: 0.2 10*3/uL (ref 0–0.7)
Eosinophils Relative: 1 %
HEMATOCRIT: 35.9 % (ref 35.0–47.0)
Hemoglobin: 12.1 g/dL (ref 12.0–16.0)
LYMPHS ABS: 10.2 10*3/uL — AB (ref 1.0–3.6)
LYMPHS PCT: 66 %
MCH: 31.5 pg (ref 26.0–34.0)
MCHC: 33.8 g/dL (ref 32.0–36.0)
MCV: 93.4 fL (ref 80.0–100.0)
MONO ABS: 1 10*3/uL — AB (ref 0.2–0.9)
Monocytes Relative: 7 %
NEUTROS ABS: 3.7 10*3/uL (ref 1.4–6.5)
Neutrophils Relative %: 25 %
Platelets: 284 10*3/uL (ref 150–440)
RBC: 3.85 MIL/uL (ref 3.80–5.20)
RDW: 13.8 % (ref 11.5–14.5)
WBC: 15.2 10*3/uL — ABNORMAL HIGH (ref 3.6–11.0)

## 2017-07-30 LAB — COMPREHENSIVE METABOLIC PANEL
ALBUMIN: 4.4 g/dL (ref 3.5–5.0)
ALT: 16 U/L (ref 14–54)
ANION GAP: 8 (ref 5–15)
AST: 19 U/L (ref 15–41)
Alkaline Phosphatase: 63 U/L (ref 38–126)
BILIRUBIN TOTAL: 0.4 mg/dL (ref 0.3–1.2)
BUN: 9 mg/dL (ref 6–20)
CO2: 27 mmol/L (ref 22–32)
Calcium: 10.1 mg/dL (ref 8.9–10.3)
Chloride: 89 mmol/L — ABNORMAL LOW (ref 101–111)
Creatinine, Ser: 0.9 mg/dL (ref 0.44–1.00)
GFR calc Af Amer: >60 mL/min (ref >60)
GFR calc non Af Amer: >60 mL/min (ref >60)
GLUCOSE: 105 mg/dL — AB (ref 65–99)
POTASSIUM: 3.5 mmol/L (ref 3.5–5.1)
SODIUM: 124 mmol/L — AB (ref 135–145)
TOTAL PROTEIN: 7.4 g/dL (ref 6.5–8.1)

## 2017-07-30 LAB — LACTATE DEHYDROGENASE: LDH: 117 U/L (ref 98–192)

## 2017-07-30 NOTE — Progress Notes (Signed)
ser

## 2017-07-30 NOTE — Progress Notes (Signed)
Patient here for initial visit. No complaints of pain or discomfort at this time.

## 2017-07-30 NOTE — Progress Notes (Signed)
Hematology/Oncology Consult note [facility] Telephone:([phone] [phone]   Patient Care Team: Dionisio David, MD as PCP - General (Cardiology)  REFERRING PROVIDER:  CHIEF COMPLAINTS/PURPOSE OF CONSULTATION:  Dionisio David, MD  HISTORY OF PRESENTING ILLNESS:  Karen [name] is a  72 y.o.  female with PMH listed below who was referred to me for evaluation of persistent leukocytosis/lymphocytosis. Reviewed patient's lab work done at PCPs office. 05/01/2017 WBC 11.7 absolute lymphocytes 7.4. Hemoglobin 13 MCV 95 platelet counts 225,000. 06/22/2017 WBC is 17.5, absolute lymphocyte 11.7 hemoglobin 11.9, MCV 92, platelet count 240,000. Patient reports feeling tired at baseline. Otherwise denies any night sweats, fever or chills. She reports she got sick around holiday. But today she feels better. She is active smoker.  Review of Systems  Constitutional: Positive for malaise/fatigue. Negative for chills, fever and weight loss.  HENT: Negative for hearing loss.   Eyes: Negative for blurred vision.  Respiratory: Negative for cough, hemoptysis, sputum production and shortness of breath.   Cardiovascular: Negative for chest pain, orthopnea and leg swelling.  Gastrointestinal: Negative for heartburn.  Genitourinary: Negative for dysuria.  Musculoskeletal: Negative for myalgias.  Skin: Negative for rash.  Neurological: Negative for dizziness and weakness.  Endo/Heme/Allergies: Does not bruise/bleed easily.  Psychiatric/Behavioral: Negative for depression.    MEDICAL HISTORY:  Past Medical History:  Diagnosis Date  . Anxiety   . Back pain   . Bronchitis   . Cancer ([place])    Skin Cancer  . Coronary artery disease   . Depression   . Dyspnea   . GERD (gastroesophageal reflux disease)   . Hypercholesteremia   . Hyperlipemia   . Hypertension   . Myocardial infarction ([place]) 1999  . Neuropathy   . Restless leg syndrome   . Sleep apnea    use  C-PAP  . Wears dentures    full upper, partial lower.  doesn't currently wear    SURGICAL HISTORY: Past Surgical History:  Procedure Laterality Date  . COLONOSCOPY WITH PROPOFOL N/A 11/20/2016   Procedure: COLONOSCOPY WITH PROPOFOL;  Surgeon: Lucilla Lame, MD;  Location: [place] [facility];  Service: Endoscopy;  Laterality: N/A;  sleep apnea  . CORONARY ANGIOPLASTY WITH STENT PLACEMENT    . DILATION AND CURETTAGE OF UTERUS    . EYE SURGERY Bilateral    Cataract Extraction with IOL  . LEFT HEART CATH AND CORONARY ANGIOGRAPHY N/A 06/29/2017   Procedure: LEFT HEART CATH AND CORONARY ANGIOGRAPHY;  Surgeon: Dionisio David, MD;  Location: [facility];  Service: Cardiovascular;  Laterality: N/A;  . POLYPECTOMY  11/20/2016   Procedure: POLYPECTOMY;  Surgeon: Lucilla Lame, MD;  Location: [facility];  Service: Endoscopy;;  . TUBAL LIGATION    . VAGINAL HYSTERECTOMY    . VULVECTOMY Right 01/31/2016   Procedure: WIDE EXCISION VULVECTOMY-RIGHT LABIA;  Surgeon: Brayton Mars, MD;  Location: [facility];  Service: Gynecology;  Laterality: Right;    SOCIAL HISTORY: Social History   Socioeconomic History  . Marital status: Single    Spouse name: Not on file  . Number of children: Not on file  . Years of education: Not on file  . Highest education level: Not on file  Social Needs  . Financial resource strain: Not on file  . Food insecurity - worry: Not on file  . Food insecurity - inability: Not on file  . Transportation needs - medical: Not on file  . Transportation needs - non-medical: Not on file  Occupational History  . Not on file  Tobacco Use  . Smoking status: Current Every Day Smoker    Packs/day: 0.25    Years: 40.00    Pack years: 10.00    Types: Cigarettes  . Smokeless tobacco: Current User    Types: Snuff  Substance and Sexual Activity  . Alcohol use: No    Alcohol/week: 0.0 oz  . Drug use: No  . Sexual activity: Not Currently    Partners: Male     Birth control/protection: Surgical  Other Topics Concern  . Not on file  Social History Narrative  . Not on file    FAMILY HISTORY: Family History  Problem Relation Age of Onset  . Heart disease Father   . Stroke Mother   . Hypertension Mother   . Lung cancer Brother   . Heart attack Brother   . Congestive Heart Failure Sister   . Cervical cancer Sister   . Cancer Neg Hx   . Diabetes Neg Hx   . Breast cancer Neg Hx     ALLERGIES:  is allergic to celebrex [celecoxib]; requip [ropinirole hcl]; meloxicam; and nitrofurantoin.  MEDICATIONS:  Current Outpatient Medications  Medication Sig Dispense Refill  . acetaminophen (TYLENOL) 500 MG tablet Take 1,000 mg by mouth daily as needed for moderate pain or headache.    . albuterol (PROVENTIL HFA;VENTOLIN HFA) 108 (90 BASE) MCG/ACT inhaler Inhale 2 puffs into the lungs every 4 (four) hours as needed for wheezing or shortness of breath. 1 Inhaler 0  . amLODipine (NORVASC) 10 MG tablet Take 10 mg by mouth daily.    Marland Kitchen aspirin EC 81 MG tablet Take 81 mg by mouth daily.    . chlorthalidone (HYGROTON) 25 MG tablet Take 25 mg by mouth daily.    . ciprofloxacin (CIPRO) 250 MG tablet Take 250 mg by mouth 2 (two) times daily.    . clopidogrel (PLAVIX) 75 MG tablet Take 75 mg by mouth daily.     Marland Kitchen conjugated estrogens (PREMARIN) vaginal cream Place 0.24 Applicatorfuls vaginally daily. 1/2 gram twice weekly 42.5 g 12  . dexlansoprazole (DEXILANT) 60 MG capsule Take 60 mg by mouth daily.     . DULoxetine (CYMBALTA) 60 MG capsule Take 60 mg by mouth daily.    Marland Kitchen gabapentin (NEURONTIN) 400 MG capsule Take 400 mg by mouth 2 (two) times daily.     . isosorbide dinitrate (ISORDIL) 30 MG tablet Take 30 mg by mouth daily.    Marland Kitchen lisinopril (PRINIVIL,ZESTRIL) 40 MG tablet Take 40 mg by mouth daily.    . metoprolol succinate (TOPROL-XL) 50 MG 24 hr tablet Take 50 mg by mouth daily. Take with or immediately following a meal.    . nitroGLYCERIN (NITROSTAT) 0.4  MG SL tablet Place 0.4 mg under the tongue every 5 (five) minutes as needed for chest pain.     . potassium chloride (K-DUR,KLOR-CON) 10 MEQ tablet Take 10 mEq by mouth daily.    . simvastatin (ZOCOR) 40 MG tablet Take 40 mg by mouth daily at 6 PM.     . traMADol (ULTRAM) 50 MG tablet Take 1 tablet (50 mg total) by mouth every 6 (six) hours as needed. (Patient taking differently: Take 50 mg by mouth daily as needed for moderate pain. ) 12 tablet 0   No current facility-administered medications for this visit.      PHYSICAL EXAMINATION: ECOG PERFORMANCE STATUS: 1 - Symptomatic but completely ambulatory Vitals:   07/30/17 1449 07/30/17 1455  BP:  134/75  Pulse:  (!) 52  Resp: 16    Filed Weights   07/30/17 1449  Weight: 162 lb 11.2 oz (73.8 kg)    Physical Exam  Constitutional: She is oriented to person, place, and time and well-developed, well-nourished, and in no distress. No distress.  HENT:  Head: Normocephalic and atraumatic.  Mouth/Throat: No oropharyngeal exudate.  Eyes: Conjunctivae and EOM are normal. Pupils are equal, round, and reactive to light.  Neck: Normal range of motion. Neck supple. No JVD present.  Cardiovascular: Normal rate and regular rhythm.  No murmur heard. Pulmonary/Chest: Effort normal. No respiratory distress.  Decreased breath sound bilaterally  Abdominal: Bowel sounds are normal. She exhibits no distension.  Did not appreciate hepato-splenomegaly.  Musculoskeletal: Normal range of motion. She exhibits no edema.  Lymphadenopathy:    She has no cervical adenopathy.  Neurological: She is alert and oriented to person, place, and time.  Skin: Skin is warm and dry.  Psychiatric: Affect and judgment normal.     LABORATORY DATA:  I have reviewed the data as listed Lab Results  Component Value Date   WBC 7.5 09/02/2016   HGB 13.5 09/02/2016   HCT 40.7 09/02/2016   MCV 94.3 09/02/2016   PLT 131 (L) 09/02/2016   Recent Labs    09/02/16 0004    NA 131*  K 3.5  CL 95*  CO2 30  GLUCOSE 103*  BUN 11  CREATININE 1.30*  CALCIUM 9.4  GFRNONAA 40*  GFRAA 47*       ASSESSMENT & PLAN:  1. Lymphocytosis    Discussed with patient and lymphocytosis can be secondary to reactive process such as infection, or it can be related to malignant hematology process, Such as CLL. We'll repeat CBC, check CMP, check flow cytometry, LDH.   All questions were answered. The patient knows to call the clinic with any problems questions or concerns.  Return of visit:  2 weeks to discuss lab work.  Thank you for this kind referral and the opportunity to participate in the care of this patient. A copy of today's note is routed to referring provider    Earlie Server, MD, PhD Hematology Oncology [facility] at [facility] Pager- [phone] 07/30/2017

## 2017-07-31 ENCOUNTER — Ambulatory Visit
Admission: RE | Admit: 2017-07-31 | Discharge: 2017-07-31 | Disposition: A | Discharge status: Home / Self Care | Payer: Medicare Other | Source: Ambulatory Visit | Attending: Oncology | Admitting: Oncology

## 2017-07-31 ENCOUNTER — Inpatient Hospital Stay: Payer: Medicare Other

## 2017-07-31 DIAGNOSIS — E871 Hypo-osmolality and hyponatremia: Secondary | ICD-10-CM | POA: Insufficient documentation

## 2017-07-31 DIAGNOSIS — J449 Chronic obstructive pulmonary disease, unspecified: Secondary | ICD-10-CM | POA: Diagnosis not present

## 2017-07-31 DIAGNOSIS — C9110 Chronic lymphocytic leukemia of B-cell type not having achieved remission: Secondary | ICD-10-CM | POA: Diagnosis not present

## 2017-07-31 LAB — BASIC METABOLIC PANEL
Anion gap: 8 (ref 5–15)
BUN: 10 mg/dL (ref 6–20)
CHLORIDE: 93 mmol/L — AB (ref 101–111)
CO2: 26 mmol/L (ref 22–32)
CREATININE: 1.17 mg/dL — AB (ref 0.44–1.00)
Calcium: 9.9 mg/dL (ref 8.9–10.3)
GFR, EST AFRICAN AMERICAN: 53 mL/min — AB (ref >60)
GFR, EST NON AFRICAN AMERICAN: 45 mL/min — AB (ref >60)
Glucose, Bld: 106 mg/dL — ABNORMAL HIGH (ref 65–99)
POTASSIUM: 3.6 mmol/L (ref 3.5–5.1)
SODIUM: 127 mmol/L — AB (ref 135–145)

## 2017-07-31 LAB — OSMOLALITY, URINE: OSMOLALITY UR: 374 mosm/kg (ref 300–900)

## 2017-07-31 LAB — SODIUM, URINE, RANDOM: Sodium, Ur: 21 mmol/L

## 2017-07-31 LAB — OSMOLALITY: OSMOLALITY: 266 mosm/kg — AB (ref 275–295)

## 2017-08-01 ENCOUNTER — Ambulatory Visit: Admission: RE | Admit: 2017-08-01 | Payer: Medicare Other | Source: Ambulatory Visit | Admitting: Oncology

## 2017-08-01 LAB — COMP PANEL: LEUKEMIA/LYMPHOMA

## 2017-08-02 ENCOUNTER — Inpatient Hospital Stay: Payer: Medicare Other

## 2017-08-03 ENCOUNTER — Telehealth: Payer: Self-pay | Admitting: *Deleted

## 2017-08-03 ENCOUNTER — Telehealth: Payer: Self-pay | Admitting: Oncology

## 2017-08-03 NOTE — Telephone Encounter (Signed)
Patient called requesting return call regarding results from 1/8. Please return call [phone]

## 2017-08-03 NOTE — Telephone Encounter (Signed)
Called and left her a message

## 2017-08-03 NOTE — Telephone Encounter (Signed)
Called patient and left message for her to contact office to r\s missed infusion appt.

## 2017-08-06 ENCOUNTER — Other Ambulatory Visit: Payer: Self-pay

## 2017-08-06 ENCOUNTER — Inpatient Hospital Stay: Payer: Medicare Other

## 2017-08-06 ENCOUNTER — Other Ambulatory Visit: Payer: Self-pay | Admitting: *Deleted

## 2017-08-06 DIAGNOSIS — E871 Hypo-osmolality and hyponatremia: Secondary | ICD-10-CM

## 2017-08-07 ENCOUNTER — Ambulatory Visit: Payer: Self-pay

## 2017-08-08 NOTE — Progress Notes (Signed)
Hematology/Oncology Follow up  note [facility] [phone] [phone]   Patient Care Team: Dionisio David, MD as PCP - General (Cardiology)  REFERRING PROVIDER:  CHIEF COMPLAINTS/PURPOSE OF CONSULTATION:  Dionisio David, MD  HISTORY OF PRESENTING ILLNESS:  Karen [name] is a  72 y.o.  female with PMH listed below who was referred to me for evaluation of persistent leukocytosis/lymphocytosis. Reviewed patient's lab work done at PCPs office. 05/01/2017 WBC 11.7 absolute lymphocytes 7.4. Hemoglobin 13 MCV 95 platelet counts 225,000. 06/22/2017 WBC is 17.5, absolute lymphocyte 11.7 hemoglobin 11.9, MCV 92, platelet count 240,000. Patient reports feeling tired at baseline. Otherwise denies any night sweats, fever or chills. She reports she got sick around holiday. But today she feels better. She is active smoker, currently 1 or 2 cigarettes a day  INTERVAL HISTORY Karen [name] is a 72 y.o. female who has above history reviewed by me today presents for follow up visit for discussion of newly diagnosed CLL and management  She was found to have hyponatremic and she was called back to do additional lab tests. I advised her to get IV hydration which she didn't come. Today she feels she is doing okay, denies any headache, chest pain, nausea vomiting, diarrhea.  Review of Systems  Constitutional: Positive for malaise/fatigue. Negative for chills, fever and weight loss.  HENT: Negative for hearing loss.   Eyes: Negative for blurred vision and photophobia.  Respiratory: Negative for cough, hemoptysis, sputum production and shortness of breath.   Cardiovascular: Negative for chest pain, orthopnea and leg swelling.  Gastrointestinal: Negative for abdominal pain and heartburn.  Genitourinary: Negative for dysuria and frequency.  Musculoskeletal: Negative for myalgias.  Skin: Negative for rash.  Neurological: Negative for dizziness, tingling and  weakness.  Endo/Heme/Allergies: Does not bruise/bleed easily.  Psychiatric/Behavioral: Negative for depression and substance abuse.    MEDICAL HISTORY:  Past Medical History:  Diagnosis Date  . Anxiety   . Back pain   . Bronchitis   . Cancer ([place])    Skin Cancer  . Coronary artery disease   . Depression   . Dyspnea   . GERD (gastroesophageal reflux disease)   . Hypercholesteremia   . Hyperlipemia   . Hypertension   . Myocardial infarction ([place]) 1999  . Neuropathy   . Restless leg syndrome   . Sleep apnea    use C-PAP  . Wears dentures    full upper, partial lower.  doesn't currently wear    SURGICAL HISTORY: Past Surgical History:  Procedure Laterality Date  . COLONOSCOPY WITH PROPOFOL N/A 11/20/2016   Procedure: COLONOSCOPY WITH PROPOFOL;  Surgeon: Lucilla Lame, MD;  Location: [place];  Service: Endoscopy;  Laterality: N/A;  sleep apnea  . CORONARY ANGIOPLASTY WITH STENT PLACEMENT    . DILATION AND CURETTAGE OF UTERUS    . EYE SURGERY Bilateral    Cataract Extraction with IOL  . LEFT HEART CATH AND CORONARY ANGIOGRAPHY N/A 06/29/2017   Procedure: LEFT HEART CATH AND CORONARY ANGIOGRAPHY;  Surgeon: Dionisio David, MD;  Location: [place] [facility];  Service: Cardiovascular;  Laterality: N/A;  . POLYPECTOMY  11/20/2016   Procedure: POLYPECTOMY;  Surgeon: Lucilla Lame, MD;  Location: [place];  Service: Endoscopy;;  . TUBAL LIGATION    . VAGINAL HYSTERECTOMY    . VULVECTOMY Right 01/31/2016   Procedure: WIDE EXCISION VULVECTOMY-RIGHT LABIA;  Surgeon: Brayton Mars, MD;  Location: [facility];  Service: Gynecology;  Laterality: Right;  SOCIAL HISTORY: Social History   Socioeconomic History  . Marital status: Single    Spouse name: Not on file  . Number of children: Not on file  . Years of education: Not on file  . Highest education level: Not on file  Social Needs  . Financial resource strain: Not on file  . Food insecurity - worry:  Not on file  . Food insecurity - inability: Not on file  . Transportation needs - medical: Not on file  . Transportation needs - non-medical: Not on file  Occupational History  . Not on file  Tobacco Use  . Smoking status: Current Every Day Smoker    Packs/day: 0.25    Years: 40.00    Pack years: 10.00    Types: Cigarettes  . Smokeless tobacco: Current User    Types: Snuff  Substance and Sexual Activity  . Alcohol use: No    Alcohol/week: 0.0 oz  . Drug use: No  . Sexual activity: Not Currently    Partners: Male    Birth control/protection: Surgical  Other Topics Concern  . Not on file  Social History Narrative  . Not on file    FAMILY HISTORY: Family History  Problem Relation Age of Onset  . Heart disease Father   . Stroke Mother   . Hypertension Mother   . Lung cancer Brother   . Heart attack Brother   . Congestive Heart Failure Sister   . Cervical cancer Sister   . Cancer Neg Hx   . Diabetes Neg Hx   . Breast cancer Neg Hx     ALLERGIES:  is allergic to celebrex [celecoxib]; requip [ropinirole hcl]; meloxicam; and nitrofurantoin.  MEDICATIONS:  Current Outpatient Medications  Medication Sig Dispense Refill  . acetaminophen (TYLENOL) 500 MG tablet Take 1,000 mg by mouth daily as needed for moderate pain or headache.    . albuterol (PROVENTIL HFA;VENTOLIN HFA) 108 (90 BASE) MCG/ACT inhaler Inhale 2 puffs into the lungs every 4 (four) hours as needed for wheezing or shortness of breath. 1 Inhaler 0  . amLODipine (NORVASC) 10 MG tablet Take 10 mg by mouth daily.    Marland Kitchen aspirin EC 81 MG tablet Take 81 mg by mouth daily.    . chlorthalidone (HYGROTON) 25 MG tablet Take 25 mg by mouth daily.    . clopidogrel (PLAVIX) 75 MG tablet Take 75 mg by mouth daily.     Marland Kitchen conjugated estrogens (PREMARIN) vaginal cream Place 5.95 Applicatorfuls vaginally daily. 1/2 gram twice weekly 42.5 g 12  . dexlansoprazole (DEXILANT) 60 MG capsule Take 60 mg by mouth daily.     . DULoxetine  (CYMBALTA) 60 MG capsule Take 60 mg by mouth daily.    Marland Kitchen gabapentin (NEURONTIN) 400 MG capsule Take 400 mg by mouth 2 (two) times daily.     . isosorbide dinitrate (ISORDIL) 30 MG tablet Take 30 mg by mouth daily.    Marland Kitchen lisinopril (PRINIVIL,ZESTRIL) 40 MG tablet Take 40 mg by mouth daily.    . metoprolol succinate (TOPROL-XL) 50 MG 24 hr tablet Take 50 mg by mouth daily. Take with or immediately following a meal.    . nitroGLYCERIN (NITROSTAT) 0.4 MG SL tablet Place 0.4 mg under the tongue every 5 (five) minutes as needed for chest pain.     . potassium chloride (K-DUR,KLOR-CON) 10 MEQ tablet Take 10 mEq by mouth daily.    . simvastatin (ZOCOR) 40 MG tablet Take 40 mg by mouth daily at 6 PM.     .  traMADol (ULTRAM) 50 MG tablet Take 1 tablet (50 mg total) by mouth every 6 (six) hours as needed. (Patient taking differently: Take 50 mg by mouth daily as needed for moderate pain. ) 12 tablet 0   No current facility-administered medications for this visit.      PHYSICAL EXAMINATION: ECOG PERFORMANCE STATUS: 1 - Symptomatic but completely ambulatory Vitals:   08/09/17 1451  BP: (!) 148/72  Pulse: (!) 55  Resp: 16  Temp: (!) 97.2 F (36.2 C)   Filed Weights   08/09/17 1451  Weight: 161 lb 3.2 oz (73.1 kg)    Physical Exam  Constitutional: She is oriented to person, place, and time and well-developed, well-nourished, and in no distress. No distress.  HENT:  Head: Normocephalic and atraumatic.  Mouth/Throat: No oropharyngeal exudate.  Eyes: Conjunctivae and EOM are normal. Pupils are equal, round, and reactive to light. No scleral icterus.  Neck: Normal range of motion. Neck supple. No JVD present.  Cardiovascular: Normal rate and regular rhythm.  No murmur heard. Pulmonary/Chest: Effort normal. No respiratory distress.  Decreased breath sound bilaterally  Abdominal: Bowel sounds are normal. She exhibits no distension. There is no rebound and no guarding.  Did not appreciate  hepato-splenomegaly.  Musculoskeletal: Normal range of motion. She exhibits no edema.  Lymphadenopathy:    She has no cervical adenopathy.  Neurological: She is alert and oriented to person, place, and time. No cranial nerve deficit.  Skin: Skin is warm and dry. No erythema.  Psychiatric: Affect and judgment normal.     LABORATORY DATA:  I have reviewed the data as listed Lab Results  Component Value Date   WBC 15.2 (H) 07/30/2017   HGB 12.1 07/30/2017   HCT 35.9 07/30/2017   MCV 93.4 07/30/2017   PLT 284 07/30/2017   Recent Labs    09/02/16 0004 07/30/17 1658 07/31/17 1540  NA 131* 124* 127*  K 3.5 3.5 3.6  CL 95* 89* 93*  CO2 30 27 26   GLUCOSE 103* 105* 106*  BUN 11 9 10   CREATININE 1.30* 0.90 1.17*  CALCIUM 9.4 10.1 9.9  GFRNONAA 40* >60 45*  GFRAA 47* >60 53*  PROT  --  7.4  --   ALBUMIN  --  4.4  --   AST  --  19  --   ALT  --  16  --   ALKPHOS  --  63  --   BILITOT  --  0.4  --     Serum Osmolarity 266, urine Osm 374, urine sodium 21.    ASSESSMENT & PLAN:  1. CLL (chronic lymphocytic leukemia) ([place])   2. Hyponatremia   Discussed with patient that her lymphocytosis is because of chronic lymphocytic leukemia. Explained to patient that this is an indolent process and she appears to have a very early stage. I could not appreciate hepatosplenomegaly or lymphadenopathy. She does not have any cytopenia. I recommend watchful waiting. I printed patient information of CLL and provided to patient.  # Hyponatremia, we'll repeat a BMP today. Patient had CT chest lung cancer screening in the past which showed stable small lung nodules. Last CT scan was done in 2017. I repeated chest x-ray which showed no active cardiopulmonary disease. I advised patient to follow up with primary care physician   All questions were answered. The patient knows to call the clinic with any problems questions or concerns.  Return of vis 6 months with repeat CBC and CMP. Thank you for this  kind  referral and the opportunity to participate in the care of this patient. A copy of today's note is routed to referring provider    Earlie Server, MD, PhD Hematology Oncology [facility] at [facility] Pager- [phone] 08/09/2017

## 2017-08-09 ENCOUNTER — Inpatient Hospital Stay: Payer: Medicare Other

## 2017-08-09 ENCOUNTER — Encounter: Payer: Self-pay | Admitting: Oncology

## 2017-08-09 ENCOUNTER — Inpatient Hospital Stay (HOSPITAL_BASED_OUTPATIENT_CLINIC_OR_DEPARTMENT_OTHER): Payer: Medicare Other | Admitting: Oncology

## 2017-08-09 VITALS — BP 148/72 | HR 55 | Temp 97.2°F | Resp 16 | Wt 161.2 lb

## 2017-08-09 DIAGNOSIS — G4730 Sleep apnea, unspecified: Secondary | ICD-10-CM

## 2017-08-09 DIAGNOSIS — C9110 Chronic lymphocytic leukemia of B-cell type not having achieved remission: Secondary | ICD-10-CM | POA: Diagnosis not present

## 2017-08-09 DIAGNOSIS — I10 Essential (primary) hypertension: Secondary | ICD-10-CM

## 2017-08-09 DIAGNOSIS — I252 Old myocardial infarction: Secondary | ICD-10-CM | POA: Diagnosis not present

## 2017-08-09 DIAGNOSIS — E871 Hypo-osmolality and hyponatremia: Secondary | ICD-10-CM

## 2017-08-09 DIAGNOSIS — F17210 Nicotine dependence, cigarettes, uncomplicated: Secondary | ICD-10-CM | POA: Diagnosis not present

## 2017-08-09 DIAGNOSIS — Z803 Family history of malignant neoplasm of breast: Secondary | ICD-10-CM | POA: Diagnosis not present

## 2017-08-09 DIAGNOSIS — Z8049 Family history of malignant neoplasm of other genital organs: Secondary | ICD-10-CM

## 2017-08-09 DIAGNOSIS — Z85828 Personal history of other malignant neoplasm of skin: Secondary | ICD-10-CM

## 2017-08-09 LAB — BASIC METABOLIC PANEL
Anion gap: 6 (ref 5–15)
BUN: 8 mg/dL (ref 6–20)
CHLORIDE: 99 mmol/L — AB (ref 101–111)
CO2: 29 mmol/L (ref 22–32)
CREATININE: 0.92 mg/dL (ref 0.44–1.00)
Calcium: 9.8 mg/dL (ref 8.9–10.3)
Glucose, Bld: 89 mg/dL (ref 65–99)
Potassium: 4 mmol/L (ref 3.5–5.1)
SODIUM: 134 mmol/L — AB (ref 135–145)

## 2017-08-09 NOTE — Progress Notes (Signed)
Patient is here today for a follow up. Patient states no new concerns today.  

## 2017-08-16 ENCOUNTER — Ambulatory Visit: Payer: Medicare Other

## 2017-08-21 ENCOUNTER — Ambulatory Visit: Payer: Medicare Other

## 2017-08-27 ENCOUNTER — Ambulatory Visit
Admission: RE | Admit: 2017-08-27 | Discharge: 2017-08-27 | Disposition: A | Discharge status: Home / Self Care | Payer: Medicare Other | Source: Ambulatory Visit | Attending: Oncology | Admitting: Oncology

## 2017-08-27 DIAGNOSIS — C9110 Chronic lymphocytic leukemia of B-cell type not having achieved remission: Secondary | ICD-10-CM

## 2017-09-25 ENCOUNTER — Other Ambulatory Visit: Payer: Self-pay | Admitting: Internal Medicine

## 2017-09-25 DIAGNOSIS — Z1231 Encounter for screening mammogram for malignant neoplasm of breast: Secondary | ICD-10-CM

## 2017-12-14 NOTE — Progress Notes (Signed)
ANNUAL PREVENTATIVE CARE GYN  ENCOUNTER NOTE  Subjective:       Karen [name] is a 72 y.o. [id number] female here for a routine annual gynecologic exam.  Current complaints: 1. Vaginal bumps, minimally symptomatic 2.  Lymphoma diagnosed in January 2019; currently observed without treatment at this time, has follow-up in July 2019  History of vulvar mass, status post wide local excision on 27/74/1287; pathology-follicular cyst, infundibular type (epidermal inclusion cyst) Patient does report occasional vasomotor symptoms. Bowel function is normal. Bladder function is notable for some urinary incontinence prompting her to wear pads.   Gynecologic History No LMP recorded. Patient has had a total vaginal hysterectomy. Contraception: status post hysterectomy status post TVH Last Pap: ?Marland Kitchen Results were: normal Last mammogram: 12/10/2016 birad 1. Results were: normal History of recurrent genital herpes  Obstetric History OB History  Gravida Para Term Preterm AB Living  8 5 5   3 5   SAB TAB Ectopic Multiple Live Births  3       5    # Outcome Date GA Lbr Len/2nd Weight Sex Delivery Anes PTL Lv  8 Term 62    M Vag-Spont   LIV  7 Term 26    F Vag-Spont   LIV  6 Term 57    F Vag-Spont   LIV  5 Term 15    F Vag-Spont   LIV  4 Term 52    M Vag-Spont   LIV  3 SAB           2 SAB           1 SAB             Past Medical History:  Diagnosis Date  . Anxiety   . Back pain   . Bronchitis   . Cancer ([place])    Skin Cancer  . Coronary artery disease   . Depression   . Dyspnea   . GERD (gastroesophageal reflux disease)   . Hypercholesteremia   . Hyperlipemia   . Hypertension   . Myocardial infarction ([place]) 1999  . Neuropathy   . Restless leg syndrome   . Sleep apnea    use C-PAP  . Wears dentures    full upper, partial lower.  doesn't currently wear    Past Surgical History:  Procedure Laterality Date  . COLONOSCOPY WITH PROPOFOL N/A 11/20/2016   Procedure: COLONOSCOPY  WITH PROPOFOL;  Surgeon: Lucilla Lame, MD;  Location: [place];  Service: Endoscopy;  Laterality: N/A;  sleep apnea  . CORONARY ANGIOPLASTY WITH STENT PLACEMENT    . DILATION AND CURETTAGE OF UTERUS    . EYE SURGERY Bilateral    Cataract Extraction with IOL  . LEFT HEART CATH AND CORONARY ANGIOGRAPHY N/A 06/29/2017   Procedure: LEFT HEART CATH AND CORONARY ANGIOGRAPHY;  Surgeon: Dionisio David, MD;  Location: [place] [facility];  Service: Cardiovascular;  Laterality: N/A;  . POLYPECTOMY  11/20/2016   Procedure: POLYPECTOMY;  Surgeon: Lucilla Lame, MD;  Location: [place];  Service: Endoscopy;;  . TUBAL LIGATION    . VAGINAL HYSTERECTOMY    . VULVECTOMY Right 01/31/2016   Procedure: WIDE EXCISION VULVECTOMY-RIGHT LABIA;  Surgeon: Brayton Mars, MD;  Location: [facility];  Service: Gynecology;  Laterality: Right;    Current Outpatient Medications on File Prior to Visit  Medication Sig Dispense Refill  . acetaminophen (TYLENOL) 500 MG tablet Take 1,000 mg by mouth daily as needed for moderate pain or headache.    Marland Kitchen  albuterol (PROVENTIL HFA;VENTOLIN HFA) 108 (90 BASE) MCG/ACT inhaler Inhale 2 puffs into the lungs every 4 (four) hours as needed for wheezing or shortness of breath. 1 Inhaler 0  . amLODipine (NORVASC) 10 MG tablet Take 10 mg by mouth daily.    Marland Kitchen aspirin EC 81 MG tablet Take 81 mg by mouth daily.    . chlorthalidone (HYGROTON) 25 MG tablet Take 25 mg by mouth daily.    . clopidogrel (PLAVIX) 75 MG tablet Take 75 mg by mouth daily.     Marland Kitchen conjugated estrogens (PREMARIN) vaginal cream Place 5.03 Applicatorfuls vaginally daily. 1/2 gram twice weekly 42.5 g 12  . dexlansoprazole (DEXILANT) 60 MG capsule Take 60 mg by mouth daily.     . DULoxetine (CYMBALTA) 60 MG capsule Take 60 mg by mouth daily.    Marland Kitchen gabapentin (NEURONTIN) 400 MG capsule Take 400 mg by mouth 2 (two) times daily.     . isosorbide dinitrate (ISORDIL) 30 MG tablet Take 30 mg by mouth daily.     Marland Kitchen lisinopril (PRINIVIL,ZESTRIL) 40 MG tablet Take 40 mg by mouth daily.    . metoprolol succinate (TOPROL-XL) 50 MG 24 hr tablet Take 50 mg by mouth daily. Take with or immediately following a meal.    . nitroGLYCERIN (NITROSTAT) 0.4 MG SL tablet Place 0.4 mg under the tongue every 5 (five) minutes as needed for chest pain.     . potassium chloride (K-DUR,KLOR-CON) 10 MEQ tablet Take 10 mEq by mouth daily.    . simvastatin (ZOCOR) 40 MG tablet Take 40 mg by mouth daily at 6 PM.     . traMADol (ULTRAM) 50 MG tablet Take 1 tablet (50 mg total) by mouth every 6 (six) hours as needed. (Patient taking differently: Take 50 mg by mouth daily as needed for moderate pain. ) 12 tablet 0   No current facility-administered medications on file prior to visit.     Allergies  Allergen Reactions  . Celebrex [Celecoxib] Itching  . Requip [Ropinirole Hcl] Other (See Comments)    Throat closing  . Meloxicam Palpitations  . Nitrofurantoin Palpitations    Social History   Socioeconomic History  . Marital status: Single    Spouse name: Not on file  . Number of children: Not on file  . Years of education: Not on file  . Highest education level: Not on file  Occupational History  . Not on file  Social Needs  . Financial resource strain: Not on file  . Food insecurity:    Worry: Not on file    Inability: Not on file  . Transportation needs:    Medical: Not on file    Non-medical: Not on file  Tobacco Use  . Smoking status: Current Every Day Smoker    Packs/day: 0.25    Years: 40.00    Pack years: 10.00    Types: Cigarettes  . Smokeless tobacco: Current User    Types: Snuff  Substance and Sexual Activity  . Alcohol use: No    Alcohol/week: 0.0 oz  . Drug use: No  . Sexual activity: Not Currently    Partners: Male    Birth control/protection: Surgical  Lifestyle  . Physical activity:    Days per week: Not on file    Minutes per session: Not on file  . Stress: Not on file   Relationships  . Social connections:    Talks on phone: Not on file    Gets together: Not on file  Attends religious service: Not on file    Active member of club or organization: Not on file    Attends meetings of clubs or organizations: Not on file    Relationship status: Not on file  . Intimate partner violence:    Fear of current or ex partner: Not on file    Emotionally abused: Not on file    Physically abused: Not on file    Forced sexual activity: Not on file  Other Topics Concern  . Not on file  Social History Narrative  . Not on file    Family History  Problem Relation Age of Onset  . Heart disease Father   . Stroke Mother   . Hypertension Mother   . Lung cancer Brother   . Heart attack Brother   . Congestive Heart Failure Sister   . Cervical cancer Sister   . Cancer Neg Hx   . Diabetes Neg Hx   . Breast cancer Neg Hx     The following portions of the patient's history were reviewed and updated as appropriate: allergies, current medications, past family history, past medical history, past social history, past surgical history and problem list.  Review of Systems Review of Systems  Constitutional: Positive for malaise/fatigue. Negative for chills, fever and weight loss.  HENT: Negative.   Eyes: Negative.   Respiratory: Negative.   Cardiovascular:       History of angina  Gastrointestinal: Negative.   Genitourinary:       Urinary incontinence requires pad usage  Musculoskeletal: Negative.   Skin: Negative.   Neurological: Negative.   Endo/Heme/Allergies: Negative.   Psychiatric/Behavioral: Negative.      Objective:   BP 139/74   Pulse 62   Ht 5\' 3"  (1.6 m)   Wt 162 lb 8 oz (73.7 kg)   BMI 28.79 kg/m  CONSTITUTIONAL: Well-developed, well-nourished female in no acute distress.  PSYCHIATRIC: Normal mood and affect. Normal behavior. Normal judgment and thought content. [place]: Alert and oriented to person, place, and time. Normal muscle tone  coordination. No cranial nerve deficit noted. HENT:  Normocephalic, atraumatic, External right and left ear normal.  EYES: Conjunctivae and EOM are normal.  No scleral icterus.  NECK: Normal range of motion, supple, no masses.  Normal thyroid.  SKIN: Skin is warm and dry. No rash noted. Not diaphoretic. No erythema. No pallor. CARDIOVASCULAR: Normal heart rate noted, regular rhythm, no murmur. RESPIRATORY: Clear to auscultation bilaterally. Effort and breath sounds normal, no problems with respiration noted. BREASTS: Symmetric in size. No masses, skin changes, nipple drainage, or lymphadenopathy. ABDOMEN: Soft, no distention noted.  No tenderness, rebound or guarding.  BLADDER: Normal PELVIC:  External Genitalia: 7 mm inclusion cyst left labia majora; Two  7 mm inclusion cysts in right labia majora, noninflamed  BUS: Normal  Vagina: Moderate to severe vaginal atrophy; good vault support; vaginal cuff intact; narrowed introitus limiting single digit exam  Cervix: Surgically absent  Uterus: Surgically absent  Adnexa: Normal; nonpalpable and nontender  RV: External Exam NormaI, No Rectal Masses and Normal Sphincter tone  MUSCULOSKELETAL: Normal range of motion. No tenderness.  No cyanosis, clubbing, or edema.  2+ distal pulses. LYMPHATIC: No Axillary, Supraclavicular, or Inguinal Adenopathy.    Assessment:   Annual gynecologic examination 72 y.o. Contraception: status post Total vaginal hysterectomy [id number] Menopausal vasomotor symptoms, minimally symptomatic Vaginal atrophy, symptomatic Vulvar inclusion cysts x3, minimally symptomatic   Plan:  Pap: Not needed Mammogram: scheduled for 12/20/2017 Stool Guaiac Testing:  colonoscopy 10/2016-  [facility] Labs: pcp Routine preventative health maintenance measures emphasized: Exercise/Diet/Weight control, Tobacco Warnings and Alcohol/Substance use risks  Smoking cessation strongly encouraged Return to clinic sooner than 1 year if vulvar inclusion  cysts become symptomatic Return to [place], CMA  Brayton Mars, MD

## 2017-12-18 ENCOUNTER — Ambulatory Visit (INDEPENDENT_AMBULATORY_CARE_PROVIDER_SITE_OTHER): Payer: Medicare HMO | Admitting: Obstetrics and Gynecology

## 2017-12-18 ENCOUNTER — Encounter: Payer: Self-pay | Admitting: Obstetrics and Gynecology

## 2017-12-18 VITALS — BP 139/74 | HR 62 | Ht 63.0 in | Wt 162.5 lb

## 2017-12-18 DIAGNOSIS — Z90710 Acquired absence of both cervix and uterus: Secondary | ICD-10-CM | POA: Diagnosis not present

## 2017-12-18 DIAGNOSIS — B009 Herpesviral infection, unspecified: Secondary | ICD-10-CM | POA: Diagnosis not present

## 2017-12-18 DIAGNOSIS — N895 Stricture and atresia of vagina: Secondary | ICD-10-CM

## 2017-12-18 DIAGNOSIS — C9190 Lymphoid leukemia, unspecified not having achieved remission: Secondary | ICD-10-CM | POA: Diagnosis not present

## 2017-12-18 DIAGNOSIS — N941 Unspecified dyspareunia: Secondary | ICD-10-CM

## 2017-12-18 DIAGNOSIS — N907 Vulvar cyst: Secondary | ICD-10-CM

## 2017-12-18 DIAGNOSIS — N952 Postmenopausal atrophic vaginitis: Secondary | ICD-10-CM | POA: Diagnosis not present

## 2017-12-18 DIAGNOSIS — C9110 Chronic lymphocytic leukemia of B-cell type not having achieved remission: Secondary | ICD-10-CM

## 2017-12-18 NOTE — Patient Instructions (Signed)
1.  No Pap smear is done.  No Pap smear is needed 2.  Mammogram is already scheduled 3.  Screening labs are to be obtained through primary care 4.  Stool guaiac cards are to be obtained through primary care 5.  Continue with follow-up with oncology regarding lymphoma 6.  Refill Premarin cream intravaginal twice weekly 7.  Recommend calcium and vitamin D supplementation in the form of Tums-2 tablets 3 times a day 8.  Return in 1 year for Medicare physical 9.  Return sooner if vulvar inclusion cysts become symptomatic-we will drain them at that time if desired.

## 2017-12-20 ENCOUNTER — Ambulatory Visit
Admission: RE | Admit: 2017-12-20 | Discharge: 2017-12-20 | Disposition: A | Discharge status: Home / Self Care | Payer: Medicare HMO | Source: Ambulatory Visit | Attending: Internal Medicine | Admitting: Internal Medicine

## 2017-12-20 DIAGNOSIS — Z1231 Encounter for screening mammogram for malignant neoplasm of breast: Secondary | ICD-10-CM | POA: Diagnosis not present

## 2017-12-22 HISTORY — PX: HAND SURGERY: SHX662

## 2018-01-28 ENCOUNTER — Ambulatory Visit: Payer: Self-pay | Admitting: Podiatry

## 2018-02-08 ENCOUNTER — Inpatient Hospital Stay: Payer: Medicare HMO | Admitting: Oncology

## 2018-02-08 ENCOUNTER — Inpatient Hospital Stay: Payer: Medicare HMO

## 2018-02-15 ENCOUNTER — Other Ambulatory Visit: Payer: Self-pay

## 2018-02-15 ENCOUNTER — Inpatient Hospital Stay: Payer: Medicare HMO | Attending: Oncology

## 2018-02-15 ENCOUNTER — Inpatient Hospital Stay (HOSPITAL_BASED_OUTPATIENT_CLINIC_OR_DEPARTMENT_OTHER): Payer: Medicare HMO | Admitting: Oncology

## 2018-02-15 ENCOUNTER — Encounter: Payer: Self-pay | Admitting: Oncology

## 2018-02-15 VITALS — BP 168/73 | HR 64 | Temp 96.5°F | Wt 166.2 lb

## 2018-02-15 DIAGNOSIS — I10 Essential (primary) hypertension: Secondary | ICD-10-CM | POA: Diagnosis not present

## 2018-02-15 DIAGNOSIS — F17210 Nicotine dependence, cigarettes, uncomplicated: Secondary | ICD-10-CM | POA: Insufficient documentation

## 2018-02-15 DIAGNOSIS — Z85828 Personal history of other malignant neoplasm of skin: Secondary | ICD-10-CM

## 2018-02-15 DIAGNOSIS — C9110 Chronic lymphocytic leukemia of B-cell type not having achieved remission: Secondary | ICD-10-CM

## 2018-02-15 LAB — COMPREHENSIVE METABOLIC PANEL
ALK PHOS: 73 U/L (ref 38–126)
ALT: 21 U/L (ref 0–44)
ANION GAP: 10 (ref 5–15)
AST: 22 U/L (ref 15–41)
Albumin: 3.9 g/dL (ref 3.5–5.0)
BILIRUBIN TOTAL: 0.5 mg/dL (ref 0.3–1.2)
BUN: 12 mg/dL (ref 8–23)
CO2: 25 mmol/L (ref 22–32)
Calcium: 9.5 mg/dL (ref 8.9–10.3)
Chloride: 102 mmol/L (ref 98–111)
Creatinine, Ser: 1.11 mg/dL — ABNORMAL HIGH (ref 0.44–1.00)
GFR, EST AFRICAN AMERICAN: 56 mL/min — AB (ref >60)
GFR, EST NON AFRICAN AMERICAN: 48 mL/min — AB (ref >60)
Glucose, Bld: 121 mg/dL — ABNORMAL HIGH (ref 70–99)
Potassium: 3.6 mmol/L (ref 3.5–5.1)
SODIUM: 137 mmol/L (ref 135–145)
TOTAL PROTEIN: 6.9 g/dL (ref 6.5–8.1)

## 2018-02-15 LAB — CBC WITH DIFFERENTIAL/PLATELET
BASOS ABS: 0 10*3/uL (ref 0–0.1)
BASOS PCT: 0 %
EOS ABS: 0.2 10*3/uL (ref 0–0.7)
EOS PCT: 2 %
HCT: 37.1 % (ref 35.0–47.0)
Hemoglobin: 12.5 g/dL (ref 12.0–16.0)
Lymphocytes Relative: 58 %
Lymphs Abs: 6.3 10*3/uL — ABNORMAL HIGH (ref 1.0–3.6)
MCH: 31.4 pg (ref 26.0–34.0)
MCHC: 33.6 g/dL (ref 32.0–36.0)
MCV: 93.5 fL (ref 80.0–100.0)
Monocytes Absolute: 0.9 10*3/uL (ref 0.2–0.9)
Monocytes Relative: 8 %
Neutro Abs: 3.5 10*3/uL (ref 1.4–6.5)
Neutrophils Relative %: 32 %
Platelets: 194 10*3/uL (ref 150–440)
RBC: 3.97 MIL/uL (ref 3.80–5.20)
RDW: 14.1 % (ref 11.5–14.5)
WBC: 10.9 10*3/uL (ref 3.6–11.0)

## 2018-02-15 MED ORDER — ACETAMINOPHEN 500 MG PO TABS: 1000.0000 mg | ORAL_TABLET | Freq: Every day | ORAL | 0 refills | Status: DC | PRN

## 2018-02-15 NOTE — Progress Notes (Signed)
Hematology/Oncology Follow up  note [facility] Telephone:(336) [phone] [phone]   Patient Care Team: Perrin Maltese, MD as PCP - General (Internal Medicine)  REFERRING PROVIDER:  Perrin Maltese, MD  REASON FOR VISIT Follow up for treatment of CLL  HISTORY OF PRESENTING ILLNESS:  Karen [name] is a  72 y.o.  female with PMH listed below who was referred to me for evaluation of persistent leukocytosis/lymphocytosis. Reviewed patient's lab work done at PCPs office. 05/01/2017 WBC 11.7 absolute lymphocytes 7.4. Hemoglobin 13 MCV 95 platelet counts 225,000. 06/22/2017 WBC is 17.5, absolute lymphocyte 11.7 hemoglobin 11.9, MCV 92, platelet count 240,000. Patient reports feeling tired at baseline. Otherwise denies any night sweats, fever or chills. She reports she got sick around holiday. But today she feels better. She is active smoker, currently 1 or 2 cigarettes a day  INTERVAL HISTORY Karen [name] is a 72 y.o. female who has above history reviewed by me today present for follow-up visit for CLL  Patient reports feeling well today.  Denies any weight loss, night sweats.  Fever or chills.  Chronic fatigue, stable. continues to smoke daily  Review of Systems  Constitutional: Positive for malaise/fatigue. Negative for chills, fever and weight loss.  HENT: Negative for hearing loss, nosebleeds and sore throat.   Eyes: Negative for blurred vision, double vision, photophobia and redness.  Respiratory: Negative for cough, hemoptysis, sputum production, shortness of breath and wheezing.   Cardiovascular: Negative for chest pain, palpitations, orthopnea and leg swelling.  Gastrointestinal: Negative for abdominal pain, blood in stool, heartburn, nausea and vomiting.  Genitourinary: Negative for dysuria and frequency.  Musculoskeletal: Negative for back pain, myalgias and neck pain.  Skin: Negative for itching and rash.  Neurological: Negative for  dizziness, tingling, tremors and weakness.  Endo/Heme/Allergies: Negative for environmental allergies. Does not bruise/bleed easily.  Psychiatric/Behavioral: Negative for depression and substance abuse.    MEDICAL HISTORY:  Past Medical History:  Diagnosis Date  . Anxiety   . Artery occlusion   . Back pain   . Bronchitis   . Cancer ([place])    Skin Cancer  . CLL (chronic lymphocytic leukemia) ([place])   . Coronary artery disease   . Depression   . Dyspnea   . GERD (gastroesophageal reflux disease)   . Hypercholesteremia   . Hyperlipemia   . Hypertension   . Myocardial infarction ([place]) 1999  . Neuropathy   . Restless leg syndrome   . Sleep apnea    use C-PAP  . Wears dentures    full upper, partial lower.  doesn't currently wear    SURGICAL HISTORY: Past Surgical History:  Procedure Laterality Date  . COLONOSCOPY WITH PROPOFOL N/A 11/20/2016   Procedure: COLONOSCOPY WITH PROPOFOL;  Surgeon: Lucilla Lame, MD;  Location: [place];  Service: Endoscopy;  Laterality: N/A;  sleep apnea  . CORONARY ANGIOPLASTY WITH STENT PLACEMENT    . DILATION AND CURETTAGE OF UTERUS    . EYE SURGERY Bilateral    Cataract Extraction with IOL  . LEFT HEART CATH AND CORONARY ANGIOGRAPHY N/A 06/29/2017   Procedure: LEFT HEART CATH AND CORONARY ANGIOGRAPHY;  Surgeon: Dionisio David, MD;  Location: [place] [facility];  Service: Cardiovascular;  Laterality: N/A;  . POLYPECTOMY  11/20/2016   Procedure: POLYPECTOMY;  Surgeon: Lucilla Lame, MD;  Location: [place];  Service: Endoscopy;;  . TUBAL LIGATION    . VAGINAL HYSTERECTOMY    . VULVECTOMY Right 01/31/2016   Procedure: WIDE  EXCISION VULVECTOMY-RIGHT LABIA;  Surgeon: Brayton Mars, MD;  Location: [facility];  Service: Gynecology;  Laterality: Right;    SOCIAL HISTORY: Social History   Socioeconomic History  . Marital status: Single    Spouse name: Not on file  . Number of children: Not on file  . Years of education: Not  on file  . Highest education level: Not on file  Occupational History  . Not on file  Social Needs  . Financial resource strain: Not on file  . Food insecurity:    Worry: Not on file    Inability: Not on file  . Transportation needs:    Medical: Not on file    Non-medical: Not on file  Tobacco Use  . Smoking status: Current Every Day Smoker    Packs/day: 0.25    Years: 40.00    Pack years: 10.00    Types: Cigarettes  . Smokeless tobacco: Current User    Types: Snuff  Substance and Sexual Activity  . Alcohol use: No    Alcohol/week: 0.0 oz  . Drug use: No  . Sexual activity: Not Currently    Partners: Male    Birth control/protection: Surgical  Lifestyle  . Physical activity:    Days per week: 0 days    Minutes per session: 0 min  . Stress: Not on file  Relationships  . Social connections:    Talks on phone: Not on file    Gets together: Not on file    Attends religious service: Not on file    Active member of club or organization: Not on file    Attends meetings of clubs or organizations: Not on file    Relationship status: Not on file  . Intimate partner violence:    Fear of current or ex partner: Not on file    Emotionally abused: Not on file    Physically abused: Not on file    Forced sexual activity: Not on file  Other Topics Concern  . Not on file  Social History Narrative  . Not on file    FAMILY HISTORY: Family History  Problem Relation Age of Onset  . Heart disease Father   . Stroke Mother   . Hypertension Mother   . Lung cancer Brother   . Heart attack Brother   . Congestive Heart Failure Sister   . Cervical cancer Sister   . Cancer Neg Hx   . Diabetes Neg Hx   . Breast cancer Neg Hx     ALLERGIES:  is allergic to celebrex [celecoxib]; requip [ropinirole hcl]; meloxicam; and nitrofurantoin.  MEDICATIONS:  Current Outpatient Medications  Medication Sig Dispense Refill  . albuterol (PROVENTIL HFA;VENTOLIN HFA) 108 (90 BASE) MCG/ACT inhaler  Inhale 2 puffs into the lungs every 4 (four) hours as needed for wheezing or shortness of breath. 1 Inhaler 0  . amLODipine (NORVASC) 10 MG tablet Take 10 mg by mouth daily.    Marland Kitchen aspirin EC 81 MG tablet Take 81 mg by mouth daily.    . chlorthalidone (HYGROTON) 25 MG tablet Take 25 mg by mouth daily.    . clopidogrel (PLAVIX) 75 MG tablet Take 75 mg by mouth daily.     Marland Kitchen dexlansoprazole (DEXILANT) 60 MG capsule Take 60 mg by mouth daily.     . DULoxetine (CYMBALTA) 60 MG capsule Take 60 mg by mouth daily.    . furosemide (LASIX) 20 MG tablet Take 20 mg by mouth daily.  3  . gabapentin (NEURONTIN)  400 MG capsule Take 400 mg by mouth 2 (two) times daily.     . isosorbide dinitrate (ISORDIL) 30 MG tablet Take 30 mg by mouth daily.    Marland Kitchen lisinopril (PRINIVIL,ZESTRIL) 40 MG tablet Take 40 mg by mouth daily.    . metoprolol succinate (TOPROL-XL) 50 MG 24 hr tablet Take 50 mg by mouth daily. Take with or immediately following a meal.    . nitroGLYCERIN (NITROSTAT) 0.4 MG SL tablet Place 0.4 mg under the tongue every 5 (five) minutes as needed for chest pain.     . potassium chloride (K-DUR,KLOR-CON) 10 MEQ tablet Take 10 mEq by mouth daily.    . simvastatin (ZOCOR) 40 MG tablet Take 40 mg by mouth daily at 6 PM.     . traMADol (ULTRAM) 50 MG tablet Take 1 tablet (50 mg total) by mouth every 6 (six) hours as needed. (Patient taking differently: Take 50 mg by mouth daily as needed for moderate pain. ) 12 tablet 0   No current facility-administered medications for this visit.      PHYSICAL EXAMINATION: ECOG PERFORMANCE STATUS: 1 - Symptomatic but completely ambulatory Vitals:   02/15/18 1038  BP: (!) 168/73  Pulse: 64  Temp: (!) 96.5 F (35.8 C)   Filed Weights   02/15/18 1038  Weight: 166 lb 3.2 oz (75.4 kg)    Physical Exam  Constitutional: She is oriented to person, place, and time and well-developed, well-nourished, and in no distress. No distress.  HENT:  Head: Normocephalic and  atraumatic.  Nose: Nose normal.  Mouth/Throat: Oropharynx is clear and moist. No oropharyngeal exudate.  Eyes: Pupils are equal, round, and reactive to light. Conjunctivae and EOM are normal. Left eye exhibits no discharge. No scleral icterus.  Neck: Normal range of motion. Neck supple. No JVD present.  Cardiovascular: Normal rate and regular rhythm.  No murmur heard. Pulmonary/Chest: Effort normal. No respiratory distress. She has no rales. She exhibits no tenderness.  Decreased breath sound bilaterally  Abdominal: Soft. Bowel sounds are normal. She exhibits no distension and no mass. There is no tenderness. There is no rebound and no guarding.  Musculoskeletal: Normal range of motion. She exhibits no edema.  Lymphadenopathy:    She has no cervical adenopathy.  Neurological: She is alert and oriented to person, place, and time. No cranial nerve deficit. She exhibits normal muscle tone. Coordination normal.  Skin: Skin is warm and dry. She is not diaphoretic. No erythema.  Psychiatric: Affect and judgment normal.     LABORATORY DATA:  I have reviewed the data as listed Lab Results  Component Value Date   WBC 10.9 02/15/2018   HGB 12.5 02/15/2018   HCT 37.1 02/15/2018   MCV 93.5 02/15/2018   PLT 194 02/15/2018   Recent Labs    07/30/17 1658 07/31/17 1540 08/09/17 1539 02/15/18 1019  NA 124* 127* 134* 137  K 3.5 3.6 4.0 3.6  CL 89* 93* 99* 102  CO2 27 26 29 25   GLUCOSE 105* 106* 89 121*  BUN 9 10 8 12   CREATININE 0.90 1.17* 0.92 1.11*  CALCIUM 10.1 9.9 9.8 9.5  GFRNONAA >60 45* >60 48*  GFRAA >60 53* >60 56*  PROT 7.4  --   --  6.9  ALBUMIN 4.4  --   --  3.9  AST 19  --   --  22  ALT 16  --   --  21  ALKPHOS 63  --   --  73  BILITOT  0.4  --   --  0.5    Serum Osmolarity 266, urine Osm 374, urine sodium 21.    ASSESSMENT & PLAN:  1. CLL (chronic lymphocytic leukemia) ([place])    Labs reviewed and discussed with patient.  He has chronic persistent lymphocytosis  consistent with CLL. Currently her counts remain stable, and she is a symptomatic.  Recommend continue watchful waiting.   All questions were answered. The patient knows to call the clinic with any problems questions or concerns. No orders of the defined types were placed in this encounter.   Return of visit  6 months Total face to face encounter time for this patient visit was 45min. >50% of the time was  spent in counseling and coordination of care.   Earlie Server, MD, PhD Hematology Oncology [facility] at [facility] Pager- [phone] 02/15/2018

## 2018-02-28 ENCOUNTER — Ambulatory Visit: Payer: Self-pay | Admitting: Podiatry

## 2018-03-14 ENCOUNTER — Emergency Department
Admission: EM | Admit: 2018-03-14 | Discharge: 2018-03-14 | Disposition: A | Discharge status: Home / Self Care | Payer: Medicare Other | Attending: Emergency Medicine | Admitting: Emergency Medicine

## 2018-03-14 ENCOUNTER — Ambulatory Visit: Payer: Self-pay | Admitting: Podiatry

## 2018-03-14 ENCOUNTER — Emergency Department: Payer: Medicare Other

## 2018-03-14 ENCOUNTER — Other Ambulatory Visit: Payer: Self-pay

## 2018-03-14 DIAGNOSIS — I10 Essential (primary) hypertension: Secondary | ICD-10-CM | POA: Diagnosis not present

## 2018-03-14 DIAGNOSIS — Y939 Activity, unspecified: Secondary | ICD-10-CM | POA: Insufficient documentation

## 2018-03-14 DIAGNOSIS — E7800 Pure hypercholesterolemia, unspecified: Secondary | ICD-10-CM | POA: Diagnosis not present

## 2018-03-14 DIAGNOSIS — Z7982 Long term (current) use of aspirin: Secondary | ICD-10-CM | POA: Diagnosis not present

## 2018-03-14 DIAGNOSIS — F17210 Nicotine dependence, cigarettes, uncomplicated: Secondary | ICD-10-CM | POA: Diagnosis not present

## 2018-03-14 DIAGNOSIS — E785 Hyperlipidemia, unspecified: Secondary | ICD-10-CM | POA: Diagnosis not present

## 2018-03-14 DIAGNOSIS — I252 Old myocardial infarction: Secondary | ICD-10-CM | POA: Diagnosis not present

## 2018-03-14 DIAGNOSIS — S6992XA Unspecified injury of left wrist, hand and finger(s), initial encounter: Secondary | ICD-10-CM | POA: Diagnosis present

## 2018-03-14 DIAGNOSIS — I2510 Atherosclerotic heart disease of native coronary artery without angina pectoris: Secondary | ICD-10-CM | POA: Insufficient documentation

## 2018-03-14 DIAGNOSIS — Y998 Other external cause status: Secondary | ICD-10-CM | POA: Insufficient documentation

## 2018-03-14 DIAGNOSIS — Z7902 Long term (current) use of antithrombotics/antiplatelets: Secondary | ICD-10-CM | POA: Diagnosis not present

## 2018-03-14 DIAGNOSIS — Z79899 Other long term (current) drug therapy: Secondary | ICD-10-CM | POA: Insufficient documentation

## 2018-03-14 DIAGNOSIS — W230XXA Caught, crushed, jammed, or pinched between moving objects, initial encounter: Secondary | ICD-10-CM | POA: Insufficient documentation

## 2018-03-14 DIAGNOSIS — Y92009 Unspecified place in unspecified non-institutional (private) residence as the place of occurrence of the external cause: Secondary | ICD-10-CM | POA: Diagnosis not present

## 2018-03-14 DIAGNOSIS — S62647A Nondisplaced fracture of proximal phalanx of left little finger, initial encounter for closed fracture: Secondary | ICD-10-CM | POA: Diagnosis not present

## 2018-03-14 DIAGNOSIS — S6292XA Unspecified fracture of left wrist and hand, initial encounter for closed fracture: Secondary | ICD-10-CM

## 2018-03-14 MED ORDER — HYDROCODONE-ACETAMINOPHEN 5-325 MG PO TABS: 1.0000 | ORAL_TABLET | Freq: Four times a day (QID) | ORAL | 0 refills | Status: DC | PRN

## 2018-03-14 MED ORDER — HYDROCODONE-ACETAMINOPHEN 5-325 MG PO TABS
1.0000 | ORAL_TABLET | Freq: Once | ORAL | Status: AC
  Administered 2018-03-14: 1 via ORAL
  Filled 2018-03-14: qty 1

## 2018-03-14 NOTE — ED Provider Notes (Signed)
[facility] Emergency Department Provider Note   ____________________________________________   First MD Initiated Contact with Patient 03/14/18 0335     (approximate)  I have reviewed the triage vital signs and the nursing notes.   HISTORY  Chief Complaint Finger Injury    HPI Karen [name] is a 72 y.o. female who presents to the ED from home with a chief complaint of left hand injury.  Patient states her left hand was crushed in a door inside her house.  She is right-hand dominant.  Other than pain and swelling to her left hand, patient voices no other complaints or injuries.   Past Medical History:  Diagnosis Date  . Anxiety   . Artery occlusion   . Back pain   . Bronchitis   . Cancer ([place])    Skin Cancer  . CLL (chronic lymphocytic leukemia) ([place])   . Coronary artery disease   . Depression   . Dyspnea   . GERD (gastroesophageal reflux disease)   . Hypercholesteremia   . Hyperlipemia   . Hypertension   . Myocardial infarction ([place]) 1999  . Neuropathy   . Restless leg syndrome   . Sleep apnea    use C-PAP  . Wears dentures    full upper, partial lower.  doesn't currently wear    Patient Active Problem List   Diagnosis Date Noted  . CLL (chronic lymphocytic leukemia) ([place]) 12/18/2017  . Unstable angina ([place]) 06/22/2017  . Inclusion cyst of vulva 12/14/2016  . Lumbar spondylosis 08/14/2016  . Trochanteric bursitis of right hip 08/14/2016  . Herpes simplex virus (HSV) infection 12/09/2015  . History of total vaginal hysterectomy (TVH) 11/25/2015  . Vaginal odor 11/25/2015  . History of UTI 11/25/2015  . Vaginal atrophy 11/25/2015  . Dyspareunia in female 11/25/2015  . Tendinitis of wrist 05/26/2015    Past Surgical History:  Procedure Laterality Date  . COLONOSCOPY WITH PROPOFOL N/A 11/20/2016   Procedure: COLONOSCOPY WITH PROPOFOL;  Surgeon: Lucilla Lame, MD;  Location: [place];  Service: Endoscopy;  Laterality:  N/A;  sleep apnea  . CORONARY ANGIOPLASTY WITH STENT PLACEMENT    . DILATION AND CURETTAGE OF UTERUS    . EYE SURGERY Bilateral    Cataract Extraction with IOL  . LEFT HEART CATH AND CORONARY ANGIOGRAPHY N/A 06/29/2017   Procedure: LEFT HEART CATH AND CORONARY ANGIOGRAPHY;  Surgeon: Dionisio David, MD;  Location: [place] [facility];  Service: Cardiovascular;  Laterality: N/A;  . POLYPECTOMY  11/20/2016   Procedure: POLYPECTOMY;  Surgeon: Lucilla Lame, MD;  Location: [place];  Service: Endoscopy;;  . TUBAL LIGATION    . VAGINAL HYSTERECTOMY    . VULVECTOMY Right 01/31/2016   Procedure: WIDE EXCISION VULVECTOMY-RIGHT LABIA;  Surgeon: Brayton Mars, MD;  Location: [facility];  Service: Gynecology;  Laterality: Right;    Prior to Admission medications   Medication Sig Start Date End Date Taking? Authorizing Provider  albuterol (PROVENTIL HFA;VENTOLIN HFA) 108 (90 BASE) MCG/ACT inhaler Inhale 2 puffs into the lungs every 4 (four) hours as needed for wheezing or shortness of breath. 07/06/15   Cuthriell, Charline Bills, PA-C  amLODipine (NORVASC) 10 MG tablet Take 10 mg by mouth daily.    [provider]  aspirin EC 81 MG tablet Take 81 mg by mouth daily.    [provider]  chlorthalidone (HYGROTON) 25 MG tablet Take 25 mg by mouth daily.    [provider]  clopidogrel (PLAVIX) 75  MG tablet Take 75 mg by mouth daily.     [provider]  dexlansoprazole (DEXILANT) 60 MG capsule Take 60 mg by mouth daily.     [provider]  DULoxetine (CYMBALTA) 60 MG capsule Take 60 mg by mouth daily.    [provider]  furosemide (LASIX) 20 MG tablet Take 20 mg by mouth daily. 12/20/17   [provider]  gabapentin (NEURONTIN) 400 MG capsule Take 400 mg by mouth 2 (two) times daily.     [provider]  isosorbide dinitrate (ISORDIL) 30 MG tablet Take 30 mg by mouth daily.    [provider]  lisinopril  (PRINIVIL,ZESTRIL) 40 MG tablet Take 40 mg by mouth daily.    [provider]  metoprolol succinate (TOPROL-XL) 50 MG 24 hr tablet Take 50 mg by mouth daily. Take with or immediately following a meal.    [provider]  nitroGLYCERIN (NITROSTAT) 0.4 MG SL tablet Place 0.4 mg under the tongue every 5 (five) minutes as needed for chest pain.     [provider]  potassium chloride (K-DUR,KLOR-CON) 10 MEQ tablet Take 10 mEq by mouth daily.    [provider]  simvastatin (ZOCOR) 40 MG tablet Take 40 mg by mouth daily at 6 PM.     [provider]  traMADol (ULTRAM) 50 MG tablet Take 1 tablet (50 mg total) by mouth every 6 (six) hours as needed. Patient taking differently: Take 50 mg by mouth daily as needed for moderate pain.  12/01/14   Ruffian, III Luanna Cole, PA-C    Allergies Celebrex [celecoxib]; Requip [ropinirole hcl]; Meloxicam; and Nitrofurantoin  Family History  Problem Relation Age of Onset  . Heart disease Father   . Stroke Mother   . Hypertension Mother   . Lung cancer Brother   . Heart attack Brother   . Congestive Heart Failure Sister   . Cervical cancer Sister   . Cancer Neg Hx   . Diabetes Neg Hx   . Breast cancer Neg Hx     Social History Social History   Tobacco Use  . Smoking status: Current Every Day Smoker    Packs/day: 0.25    Years: 40.00    Pack years: 10.00    Types: Cigarettes  . Smokeless tobacco: Current User    Types: Snuff  Substance Use Topics  . Alcohol use: No    Alcohol/week: 0.0 standard drinks  . Drug use: No    Review of Systems  Constitutional: No fever/chills Eyes: No visual changes. ENT: No sore throat. Cardiovascular: Denies chest pain. Respiratory: Denies shortness of breath. Gastrointestinal: No abdominal pain.  No nausea, no vomiting.  No diarrhea.  No constipation. Genitourinary: Negative for dysuria. Musculoskeletal: Positive for left hand pain and swelling.  Negative for back  pain. Skin: Negative for rash. Neurological: Negative for headaches, focal weakness or numbness.   ____________________________________________   PHYSICAL EXAM:  VITAL SIGNS: ED Triage Vitals  Enc Vitals Group     BP 03/14/18 0053 128/81     Pulse Rate 03/14/18 0053 68     Resp 03/14/18 0053 18     Temp 03/14/18 0053 97.8 F (36.6 C)     Temp Source 03/14/18 0053 Oral     SpO2 03/14/18 0053 98 %     Weight 03/14/18 0037 162 lb (73.5 kg)     Height 03/14/18 0037 5\' 2"  (1.575 m)     Head Circumference --  Peak Flow --      Pain Score 03/14/18 0037 6     Pain Loc --      Pain Edu? --      Excl. in [place]? --     Constitutional: Alert and oriented. Well appearing and in mild acute distress. Eyes: Conjunctivae are normal. PERRL. EOMI. Head: Atraumatic. Nose: No congestion/rhinnorhea. Mouth/Throat: Mucous membranes are moist.  Oropharynx non-erythematous. Neck: No stridor.  No cervical spine tenderness to palpation. Cardiovascular: Normal rate, regular rhythm. Grossly normal heart sounds.  Good peripheral circulation. Respiratory: Normal respiratory effort.  No retractions. Lungs CTAB. Gastrointestinal: Soft and nontender. No distention. No abdominal bruits. No CVA tenderness. Musculoskeletal:  Mild swelling and ecchymosis to left dorsal hand over fifth metatarsal area.  There is also a small area of ecchymosis to the palmar aspect near hyperthenar eminence.  Full range of motion with pain.  2+ radial pulse.  Brisk, less than 5-second capillary refill. No lower extremity tenderness nor edema.  No joint effusions. Neurologic:  Normal speech and language. No gross focal neurologic deficits are appreciated. No gait instability. Skin:  Skin is warm, dry and intact. No rash noted. Psychiatric: Mood and affect are normal. Speech and behavior are normal.  ____________________________________________   LABS (all labs ordered are listed, but only abnormal results are  displayed)  Labs Reviewed - No data to display ____________________________________________  EKG  None ____________________________________________  RADIOLOGY  ED MD interpretation: Left fifth metacarpal fracture  Official radiology report(s): Dg Hand Complete Left  Result Date: 03/14/2018 CLINICAL DATA:  Hit left hand on door pain and bruising fourth and fifth metacarpal EXAM: LEFT HAND - COMPLETE 3+ VIEW COMPARISON:  None. FINDINGS: Bones appear osteopenic. Acute fracture involving the mid to distal shaft of the fifth metacarpal with minimal displacement. No subluxation. No radiopaque foreign body. Probable old fracture deformity of the fourth proximal phalanx. IMPRESSION: Acute mildly displaced fracture involving mid to distal shaft of the fifth proximal phalanx Electronically Signed   By: Donavan Foil M.D.   On: 03/14/2018 01:05    ____________________________________________   PROCEDURES  Procedure(s) performed:     .Splint Application Date/Time: 9/83/3825 3:45 AM Performed by: Paulette Blanch, MD Authorized by: Paulette Blanch, MD      Critical Care performed: No  ____________________________________________   INITIAL IMPRESSION / ASSESSMENT AND PLAN / ED COURSE  As part of my medical decision making, I reviewed the following data within the electronic MEDICAL RECORD NUMBER History obtained from family, Nursing notes reviewed and incorporated, Radiograph reviewed and Notes from prior ED visits   72 year old female who presents with left, nondominant hand injury.  X-ray demonstrates fifth metacarpal fracture.  Will administer Norco for pain, apply OCL boxer's type splint, sling provided for comfort and refer to hand surgery for outpatient follow-up.  Strict return precautions given.  Patient and spouse verbalize understanding and agree with plan of care.      ____________________________________________   FINAL CLINICAL IMPRESSION(S) / ED DIAGNOSES  Final  diagnoses:  Hand fracture, left, closed, initial encounter     ED Discharge Orders    None       Note:  This document was prepared using Dragon voice recognition software and may include unintentional dictation errors.    Paulette Blanch, MD 03/14/18 [phone]

## 2018-03-14 NOTE — Discharge Instructions (Signed)
1.  You may take Norco as needed for pain. 2.  Keep splint clean and dry.  Elevate affected area and apply ice over splint several times daily over the next 3 days. 3.  Return to the ER for worsening symptoms, persistent vomiting, difficulty breathing or other concerns.

## 2018-03-14 NOTE — ED Triage Notes (Signed)
Pt injured her left 4th finger today, co pain. Swelling noted with pain on movement.

## 2018-03-18 ENCOUNTER — Encounter: Payer: Self-pay | Admitting: Podiatry

## 2018-03-18 ENCOUNTER — Ambulatory Visit (INDEPENDENT_AMBULATORY_CARE_PROVIDER_SITE_OTHER): Payer: Medicare Other | Admitting: Podiatry

## 2018-03-18 DIAGNOSIS — M79674 Pain in right toe(s): Secondary | ICD-10-CM

## 2018-03-18 DIAGNOSIS — D689 Coagulation defect, unspecified: Secondary | ICD-10-CM

## 2018-03-18 DIAGNOSIS — B351 Tinea unguium: Secondary | ICD-10-CM

## 2018-03-18 DIAGNOSIS — L608 Other nail disorders: Secondary | ICD-10-CM

## 2018-03-18 DIAGNOSIS — M79675 Pain in left toe(s): Secondary | ICD-10-CM | POA: Diagnosis not present

## 2018-03-18 NOTE — Progress Notes (Signed)
Complaint:  Visit Type: Patient presents  to my office for c preventative foot care services. Complaint: Patient states" my nails have grown long and thick and become painful to walk and wear shoes" She says she has trimmed her nails but they are growing thick.   The patient presents for preventative foot care services. No changes to ROS.  She is taking plavix.  Podiatric Exam: Vascular: dorsalis pedis and posterior tibial pulses are palpable bilateral. Capillary return is immediate. Temperature gradient is WNL. Skin turgor WNL  Sensorium: Normal Semmes Weinstein monofilament test. Normal tactile sensation bilaterally. Nail Exam: Pt has thick disfigured discolored nails with subungual debris noted bilateral entire nail hallux through fifth toenails.  Pincer nails noted  B/L. Ulcer Exam: There is no evidence of ulcer or pre-ulcerative changes or infection. Orthopedic Exam: Muscle tone and strength are WNL. No limitations in general ROM. No crepitus or effusions noted. Foot type and digits show no abnormalities. Bony prominences are unremarkable. Skin: No Porokeratosis. No infection or ulcers  Diagnosis:  Onychomycosis, , Pain in right toe, pain in left toes  Treatment & Plan Procedures and Treatment: Consent by patient was obtained for treatment procedures.   Debridement of mycotic and hypertrophic toenails, 1 through 5 bilateral and clearing of subungual debris. No ulceration, no infection noted.  Return Visit-Office Procedure: Patient instructed to return to the office for a follow up visit 3 months for continued evaluation and treatment.    Gardiner Barefoot DPM

## 2018-04-17 ENCOUNTER — Other Ambulatory Visit: Payer: Self-pay | Admitting: Obstetrics and Gynecology

## 2018-05-08 ENCOUNTER — Other Ambulatory Visit: Payer: Self-pay

## 2018-05-08 ENCOUNTER — Encounter: Payer: Self-pay | Admitting: Emergency Medicine

## 2018-05-08 ENCOUNTER — Emergency Department
Admission: EM | Admit: 2018-05-08 | Discharge: 2018-05-09 | Disposition: A | Discharge status: Home / Self Care | Payer: Medicare Other | Attending: Emergency Medicine | Admitting: Emergency Medicine

## 2018-05-08 ENCOUNTER — Emergency Department: Payer: Medicare Other

## 2018-05-08 DIAGNOSIS — Z7982 Long term (current) use of aspirin: Secondary | ICD-10-CM | POA: Diagnosis not present

## 2018-05-08 DIAGNOSIS — Z79899 Other long term (current) drug therapy: Secondary | ICD-10-CM | POA: Diagnosis not present

## 2018-05-08 DIAGNOSIS — R05 Cough: Secondary | ICD-10-CM | POA: Diagnosis present

## 2018-05-08 DIAGNOSIS — F17210 Nicotine dependence, cigarettes, uncomplicated: Secondary | ICD-10-CM | POA: Insufficient documentation

## 2018-05-08 DIAGNOSIS — J40 Bronchitis, not specified as acute or chronic: Secondary | ICD-10-CM | POA: Diagnosis not present

## 2018-05-08 DIAGNOSIS — F17220 Nicotine dependence, chewing tobacco, uncomplicated: Secondary | ICD-10-CM | POA: Diagnosis not present

## 2018-05-08 DIAGNOSIS — I2510 Atherosclerotic heart disease of native coronary artery without angina pectoris: Secondary | ICD-10-CM | POA: Diagnosis not present

## 2018-05-08 DIAGNOSIS — Z7902 Long term (current) use of antithrombotics/antiplatelets: Secondary | ICD-10-CM | POA: Diagnosis not present

## 2018-05-08 DIAGNOSIS — I10 Essential (primary) hypertension: Secondary | ICD-10-CM | POA: Diagnosis not present

## 2018-05-08 NOTE — ED Triage Notes (Signed)
Pt presents to ED with congestion and productive cough for the past week. Pt reports feeling pain in her right side when she cough since Tuesday. Pt answering questions appropriately with no acute distress noted at this time.

## 2018-05-09 ENCOUNTER — Other Ambulatory Visit: Payer: Self-pay

## 2018-05-09 DIAGNOSIS — J40 Bronchitis, not specified as acute or chronic: Secondary | ICD-10-CM | POA: Diagnosis not present

## 2018-05-09 LAB — CBC WITH DIFFERENTIAL/PLATELET
Abs Immature Granulocytes: 0.08 10*3/uL — ABNORMAL HIGH (ref 0.00–0.07)
Basophils Absolute: 0.1 10*3/uL (ref 0.0–0.1)
Basophils Relative: 0 %
Eosinophils Absolute: 0.3 10*3/uL (ref 0.0–0.5)
Eosinophils Relative: 2 %
HCT: 37.9 % (ref 36.0–46.0)
HEMOGLOBIN: 12 g/dL (ref 12.0–15.0)
IMMATURE GRANULOCYTES: 1 %
LYMPHS ABS: 8.2 10*3/uL — AB (ref 0.7–4.0)
LYMPHS PCT: 56 %
MCH: 30.7 pg (ref 26.0–34.0)
MCHC: 31.7 g/dL (ref 30.0–36.0)
MCV: 96.9 fL (ref 80.0–100.0)
Monocytes Absolute: 1 10*3/uL (ref 0.1–1.0)
Monocytes Relative: 7 %
NRBC: 0 % (ref 0.0–0.2)
Neutro Abs: 4.9 10*3/uL (ref 1.7–7.7)
Neutrophils Relative %: 34 %
PLATELETS: 267 10*3/uL (ref 150–400)
RBC: 3.91 MIL/uL (ref 3.87–5.11)
RDW: 13.2 % (ref 11.5–15.5)
WBC: 14.5 10*3/uL — ABNORMAL HIGH (ref 4.0–10.5)

## 2018-05-09 LAB — COMPREHENSIVE METABOLIC PANEL
ALK PHOS: 76 U/L (ref 38–126)
ALT: 17 U/L (ref 0–44)
AST: 20 U/L (ref 15–41)
Albumin: 4.2 g/dL (ref 3.5–5.0)
Anion gap: 6 (ref 5–15)
BUN: 9 mg/dL (ref 8–23)
CALCIUM: 9.7 mg/dL (ref 8.9–10.3)
CHLORIDE: 103 mmol/L (ref 98–111)
CO2: 29 mmol/L (ref 22–32)
CREATININE: 1.19 mg/dL — AB (ref 0.44–1.00)
GFR, EST AFRICAN AMERICAN: 51 mL/min — AB (ref >60)
GFR, EST NON AFRICAN AMERICAN: 44 mL/min — AB (ref >60)
Glucose, Bld: 132 mg/dL — ABNORMAL HIGH (ref 70–99)
Potassium: 3.4 mmol/L — ABNORMAL LOW (ref 3.5–5.1)
Sodium: 138 mmol/L (ref 135–145)
Total Bilirubin: 0.3 mg/dL (ref 0.3–1.2)
Total Protein: 7.1 g/dL (ref 6.5–8.1)

## 2018-05-09 LAB — TROPONIN I: Troponin I: <0.03 ng/mL (ref <0.03)

## 2018-05-09 LAB — LIPASE, BLOOD: LIPASE: 36 U/L (ref 11–51)

## 2018-05-09 LAB — LACTIC ACID, PLASMA: Lactic Acid, Venous: 1.5 mmol/L (ref 0.5–1.9)

## 2018-05-09 MED ORDER — HYDROCOD POLST-CPM POLST ER 10-8 MG/5ML PO SUER
5.0000 mL | Freq: Once | ORAL | Status: AC
  Administered 2018-05-09: 5 mL via ORAL
  Filled 2018-05-09: qty 5

## 2018-05-09 MED ORDER — COMPRESSOR/NEBULIZER MISC: 1.0000 [IU] | 0 refills | Status: DC | PRN

## 2018-05-09 MED ORDER — ALBUTEROL SULFATE (2.5 MG/3ML) 0.083% IN NEBU: 2.5000 mg | INHALATION_SOLUTION | RESPIRATORY_TRACT | 0 refills | Status: DC | PRN

## 2018-05-09 MED ORDER — HYDROCOD POLST-CPM POLST ER 10-8 MG/5ML PO SUER: 5.0000 mL | Freq: Two times a day (BID) | ORAL | 0 refills | Status: DC

## 2018-05-09 MED ORDER — IPRATROPIUM-ALBUTEROL 0.5-2.5 (3) MG/3ML IN SOLN
3.0000 mL | Freq: Once | RESPIRATORY_TRACT | Status: AC
  Administered 2018-05-09: 3 mL via RESPIRATORY_TRACT
  Filled 2018-05-09: qty 3

## 2018-05-09 NOTE — ED Provider Notes (Signed)
[facility] Emergency Department Provider Note   ____________________________________________   First MD Initiated Contact with Patient 05/09/18 0003     (approximate)  I have reviewed the triage vital signs and the nursing notes.   HISTORY  Chief Complaint Cough and Abdominal Pain    HPI Karen [name] is a 72 y.o. female who presents to the ED from home with a chief complaint of cough and congestion.  Patient reports a 2-day history of cough productive of clear sputum, chest congestion and feeling pain in her right side when she coughs or moves.  No fever, chills, shortness of breath, abdominal pain, nausea or vomiting. + sick contacts.  Denies recent travel or trauma.   Past Medical History:  Diagnosis Date  . Anxiety   . Artery occlusion   . Back pain   . Bronchitis   . Cancer ([place])    Skin Cancer  . CLL (chronic lymphocytic leukemia) ([place])   . Coronary artery disease   . Depression   . Dyspnea   . GERD (gastroesophageal reflux disease)   . Hypercholesteremia   . Hyperlipemia   . Hypertension   . Myocardial infarction ([place]) 1999  . Neuropathy   . Restless leg syndrome   . Sleep apnea    use C-PAP  . Wears dentures    full upper, partial lower.  doesn't currently wear    Patient Active Problem List   Diagnosis Date Noted  . CLL (chronic lymphocytic leukemia) ([place]) 12/18/2017  . Unstable angina ([place]) 06/22/2017  . Inclusion cyst of vulva 12/14/2016  . Lumbar spondylosis 08/14/2016  . Trochanteric bursitis of right hip 08/14/2016  . Herpes simplex virus (HSV) infection 12/09/2015  . History of total vaginal hysterectomy (TVH) 11/25/2015  . Vaginal odor 11/25/2015  . History of UTI 11/25/2015  . Vaginal atrophy 11/25/2015  . Dyspareunia in female 11/25/2015  . Tendinitis of wrist 05/26/2015    Past Surgical History:  Procedure Laterality Date  . COLONOSCOPY WITH PROPOFOL N/A 11/20/2016   Procedure: COLONOSCOPY WITH PROPOFOL;   Surgeon: Lucilla Lame, MD;  Location: [facility];  Service: Endoscopy;  Laterality: N/A;  sleep apnea  . CORONARY ANGIOPLASTY WITH STENT PLACEMENT    . DILATION AND CURETTAGE OF UTERUS    . EYE SURGERY Bilateral    Cataract Extraction with IOL  . LEFT HEART CATH AND CORONARY ANGIOGRAPHY N/A 06/29/2017   Procedure: LEFT HEART CATH AND CORONARY ANGIOGRAPHY;  Surgeon: Dionisio David, MD;  Location: [facility];  Service: Cardiovascular;  Laterality: N/A;  . POLYPECTOMY  11/20/2016   Procedure: POLYPECTOMY;  Surgeon: Lucilla Lame, MD;  Location: [facility];  Service: Endoscopy;;  . TUBAL LIGATION    . VAGINAL HYSTERECTOMY    . VULVECTOMY Right 01/31/2016   Procedure: WIDE EXCISION VULVECTOMY-RIGHT LABIA;  Surgeon: Brayton Mars, MD;  Location: [facility];  Service: Gynecology;  Laterality: Right;    Prior to Admission medications   Medication Sig Start Date End Date Taking? Authorizing Provider  albuterol (PROVENTIL HFA;VENTOLIN HFA) 108 (90 BASE) MCG/ACT inhaler Inhale 2 puffs into the lungs every 4 (four) hours as needed for wheezing or shortness of breath. 07/06/15   Cuthriell, Charline Bills, PA-C  alendronate (FOSAMAX) 70 MG tablet alendronate 70 mg tablet  TAKE 1 TABLET BY MOUTH ONCE A WEEK    [provider]  ALPRAZolam (XANAX) 1 MG tablet alprazolam 1 mg tablet  TAKE 1 TABLET BY MOUTH 3 TIMES A DAY  AS NEEDED    [provider]  amLODipine (NORVASC) 10 MG tablet Take 10 mg by mouth daily.    [provider]  aspirin EC 81 MG tablet Take 81 mg by mouth daily.    [provider]  brompheniramine-pseudoephedrine-DM 30-2-10 MG/5ML syrup brompheniramine-pseudoephedrine-DM 2 mg-30 mg-10 mg/5 mL oral syrup  TAKE 5 MLS( 1 TEASPOONFUL) BY MOUTH 4 TIMES A DAY AS NEEDED    [provider]  Calcium Carb-Cholecalciferol 600-800 MG-UNIT TABS calcium carbonate-vitamin D3 600 mg (1,500 mg)-800 unit tablet  TAKE 1 TABLET BY MOUTH TWO  TIMES A DAY    [provider]  chlorthalidone (HYGROTON) 25 MG tablet Take 25 mg by mouth daily.    [provider]  clopidogrel (PLAVIX) 75 MG tablet Take 75 mg by mouth daily.     [provider]  dexlansoprazole (DEXILANT) 60 MG capsule Take 60 mg by mouth daily.     [provider]  DULoxetine (CYMBALTA) 60 MG capsule Take 60 mg by mouth daily.    [provider]  furosemide (LASIX) 20 MG tablet Take 20 mg by mouth daily. 12/20/17   [provider]  gabapentin (NEURONTIN) 400 MG capsule Take 400 mg by mouth 2 (two) times daily.     [provider]  HYDROcodone-acetaminophen (NORCO) 5-325 MG tablet Take 1 tablet by mouth every 6 (six) hours as needed for moderate pain. 03/14/18   Paulette Blanch, MD  hydrOXYzine (VISTARIL) 50 MG capsule hydroxyzine pamoate 50 mg capsule  TAKE 1 TO 2 CAPSULES BY MOUTH EVERY NIGHT AT BEDTIME    [provider]  Influenza vac split quadrivalent PF (FLUZONE HIGH-DOSE) 0.5 ML injection Fluzone High-Dose 2016-2017 (PF) 180 mcg/0.5 mL intramuscular syringe  inject 0.5 milliliter intramuscularly    [provider]  isosorbide dinitrate (ISORDIL) 30 MG tablet Take 30 mg by mouth daily.    [provider]  levothyroxine (SYNTHROID, LEVOTHROID) 25 MCG tablet levothyroxine 25 mcg tablet  TAKE 1 TABLET BY MOUTH EVERY MORNING    [provider]  lisinopril (PRINIVIL,ZESTRIL) 40 MG tablet Take 40 mg by mouth daily.    [provider]  loratadine (CLARITIN) 10 MG tablet loratadine 10 mg tablet  TAKE 1 TABLET BY MOUTH EVERY DAY    [provider]  metoprolol succinate (TOPROL-XL) 50 MG 24 hr tablet Take 50 mg by mouth daily. Take with or immediately following a meal.    [provider]  nitroGLYCERIN (NITROSTAT) 0.4 MG SL tablet Place 0.4 mg under the tongue every 5 (five) minutes as needed for chest pain.     [provider]  pneumococcal 23  valent vaccine (PNEUMOVAX 23) 25 MCG/0.5ML injection Pneumovax 23 25 mcg/0.5 mL injection syringe  inject 0.5 milliliter intramuscularly    [provider]  potassium chloride (K-DUR,KLOR-CON) 10 MEQ tablet Take 10 mEq by mouth daily.    [provider]  predniSONE (DELTASONE) 20 MG tablet prednisone 20 mg tablet  TAKE 1 TABLET BY MOUTH ONCE A DAY    [provider]  PREMARIN vaginal cream PLACE 1.82 (9/9BZ) APPLICATORFUL VAGINALLY 2 (TWO) TIMES A WEEK. 04/17/18   Defrancesco, Alanda Slim, MD  ranolazine (RANEXA) 1000 MG SR tablet Ranexa 1,000 mg tablet,extended release  TAKE 1 TABLET BY MOUTH TWICE A DAY    [provider]  rosuvastatin (CRESTOR) 40 MG tablet Crestor 40 mg tablet  TAKE 1 TABLET BY MOUTH AT BEDTIME INSTEAD OF SIMVASTATIN    [provider]  simvastatin (ZOCOR) 40 MG tablet Take 40 mg by mouth daily at 6 PM.     [provider]  traMADol (ULTRAM) 50 MG tablet Take 1 tablet (50 mg total) by mouth every 6 (six) hours as needed. Patient taking differently: Take 50 mg by mouth daily as needed for moderate pain.  12/01/14   Ruffian, III Luanna Cole, PA-C  triamterene-hydrochlorothiazide (MAXZIDE-25) 37.5-25 MG tablet Take by mouth. 11/04/02   [provider]  Zoster Vaccine Live (ZOSTAVAX [place]) Zostavax (PF)    [provider]    Allergies Celebrex [celecoxib]; Requip [ropinirole hcl]; Meloxicam; and Nitrofurantoin  Family History  Problem Relation Age of Onset  . Heart disease Father   . Stroke Mother   . Hypertension Mother   . Lung cancer Brother   . Heart attack Brother   . Congestive Heart Failure Sister   . Cervical cancer Sister   . Cancer Neg Hx   . Diabetes Neg Hx   . Breast cancer Neg Hx     Social History Social History   Tobacco Use  . Smoking status: Current Every Day Smoker    Packs/day: 0.25    Years: 40.00    Pack years: 10.00    Types: Cigarettes  . Smokeless tobacco: Current User     Types: Snuff  Substance Use Topics  . Alcohol use: No    Alcohol/week: 0.0 standard drinks  . Drug use: No    Review of Systems  Constitutional: No fever/chills Eyes: No visual changes. ENT: Positive for nasal congestion.  No sore throat. Cardiovascular: Positive for right rib/chest pain. Respiratory: Positive for productive cough.  Denies shortness of breath. Gastrointestinal: No abdominal pain.  No nausea, no vomiting.  No diarrhea.  No constipation. Genitourinary: Negative for dysuria. Musculoskeletal: Negative for back pain. Skin: Negative for rash. Neurological: Negative for headaches, focal weakness or numbness.   ____________________________________________   PHYSICAL EXAM:  VITAL SIGNS: ED Triage Vitals  Enc Vitals Group     BP 05/08/18 2104 125/69     Pulse Rate 05/08/18 2104 62     Resp 05/08/18 2104 19     Temp 05/08/18 2104 98.9 F (37.2 C)     Temp Source 05/08/18 2104 Oral     SpO2 05/08/18 2104 98 %     Weight 05/08/18 2105 166 lb (75.3 kg)     Height 05/08/18 2105 5\' 2"  (1.575 m)     Head Circumference --      Peak Flow --      Pain Score 05/08/18 2109 0     Pain Loc --      Pain Edu? --      Excl. in [place]? --     Constitutional: Alert and oriented. Well appearing and in no acute distress. Eyes: Conjunctivae are normal. PERRL. EOMI. Head: Atraumatic. Nose: Congestion/rhinnorhea. Mouth/Throat: Mucous membranes are moist.  Oropharynx non-erythematous. Neck: No stridor.   Cardiovascular: Normal rate, regular rhythm. Grossly normal heart sounds.  Good peripheral circulation. Respiratory: Normal respiratory effort.  No retractions. Lungs with scattered rhonchi.  Right lower anterior ribs tender to palpation and with movement of trunk.  No splinting.  No crepitus. Gastrointestinal: Soft and nontender to light or deep palpation. No distention. No abdominal bruits. No CVA tenderness. Musculoskeletal: No lower extremity tenderness nor edema.  No joint  effusions. Neurologic:  Normal speech and language. No gross focal neurologic deficits are appreciated. No gait instability. Skin:  Skin is warm, dry and intact.  No rash noted. Psychiatric: Mood and affect are normal. Speech and behavior are normal.  ____________________________________________   LABS (all labs ordered are listed, but only abnormal results are displayed)  Labs Reviewed  CBC WITH DIFFERENTIAL/PLATELET - Abnormal; Notable for the following components:      Result Value   WBC 14.5 (*)    Lymphs Abs 8.2 (*)    Abs Immature Granulocytes 0.08 (*)    All other components within normal limits  COMPREHENSIVE METABOLIC PANEL - Abnormal; Notable for the following components:   Potassium 3.4 (*)    Glucose, Bld 132 (*)    Creatinine, Ser 1.19 (*)    GFR calc non Af Amer 44 (*)    GFR calc Af Amer 51 (*)    All other components within normal limits  CULTURE, BLOOD (ROUTINE X 2)  CULTURE, BLOOD (ROUTINE X 2)  LIPASE, BLOOD  TROPONIN I  LACTIC ACID, PLASMA   ____________________________________________  EKG  ED ECG REPORT I, Kumari Sculley J, the attending physician, personally viewed and interpreted this ECG.   Date: 05/09/2018  EKG Time: 0049  Rate: 52  Rhythm: sinus bradycardia  Axis: Normal  Intervals:none  ST&T Change: Nonspecific  ____________________________________________  RADIOLOGY  ED MD interpretation: No acute cardiopulmonary process  Official radiology report(s): Dg Chest 2 View  Result Date: 05/08/2018 CLINICAL DATA:  Initial evaluation for productive cough. EXAM: CHEST - 2 VIEW COMPARISON:  Prior radiograph from 07/31/2017. FINDINGS: Cardiac and mediastinal silhouettes are stable in size and contour, and remain within normal limits. Aortic atherosclerosis. Lungs are mildly hyperinflated with changes related to COPD. Mild left basilar atelectasis and/or scarring. No consolidative airspace opacity. No pulmonary edema or pleural effusion. No  pneumothorax. No acute osseous abnormality. IMPRESSION: 1. COPD with associated mild left basilar atelectasis and/or scarring. No other active cardiopulmonary disease. 2. Aortic atherosclerosis. Electronically Signed   By: Jeannine Boga M.D.   On: 05/08/2018 22:13    ____________________________________________   PROCEDURES  Procedure(s) performed: None  Procedures  Critical Care performed: No  ____________________________________________   INITIAL IMPRESSION / ASSESSMENT AND PLAN / ED COURSE  As part of my medical decision making, I reviewed the following data within the [facility] notes reviewed and incorporated, Labs reviewed, EKG interpreted, Old chart reviewed, Radiograph reviewed and Notes from prior ED visits   72 year old female who presents with productive cough and reproducible rib pain. Differential includes, but is not limited to, viral syndrome, bronchitis including COPD exacerbation, pneumonia, reactive airway disease including asthma, CHF including exacerbation with or without pulmonary/interstitial edema, pneumothorax, ACS, thoracic trauma, and pulmonary embolism.  Chest x-ray negative for pneumonia.  Will check screening lab work including blood cultures and lactate.  Will initiate nebulizer treatment to clear up rhonchi.  Tussionex will be given for cough.  Clinical Course as of May 09 199  Thu May 09, 2018  0118 Lactate is negative.  Patient feeling better after nebulizer treatment.  Awaiting rest of lab work.   [JS]  0159 Aeration improved; rhonchi resolved.  Updated patient on all test results.  Will discharge home with albuterol nebulizer and solution, Tussionex to use as needed.  Patient will follow-up closely with her PCP.  Strict return precautions given.  Patient verbalizes understanding and agrees with plan of care.   [JS]    Clinical Course User Index [JS] Paulette Blanch, MD      ____________________________________________   FINAL CLINICAL IMPRESSION(S) / ED DIAGNOSES  Final diagnoses:  Bronchitis  ED Discharge Orders    None       Note:  This document was prepared using Dragon voice recognition software and may include unintentional dictation errors.    Paulette Blanch, MD 05/09/18 [phone]

## 2018-05-09 NOTE — Discharge Instructions (Addendum)
1.  You may take Tussionex as needed for cough. 2.  You may use your albuterol inhaler or nebulizer treatment every 4 hours as needed for cough/difficulty breathing. 3.  Return to the ER for worsening symptoms, persistent vomiting, difficulty breathing or other concerns.

## 2018-05-14 LAB — CULTURE, BLOOD (ROUTINE X 2)
CULTURE: NO GROWTH
Culture: NO GROWTH

## 2018-06-07 ENCOUNTER — Ambulatory Visit
Admission: RE | Admit: 2018-06-07 | Discharge: 2018-06-07 | Disposition: A | Discharge status: Home / Self Care | Payer: Medicare Other | Source: Ambulatory Visit | Attending: Family | Admitting: Family

## 2018-06-07 ENCOUNTER — Other Ambulatory Visit: Payer: Self-pay | Admitting: Family

## 2018-06-07 DIAGNOSIS — J449 Chronic obstructive pulmonary disease, unspecified: Secondary | ICD-10-CM

## 2018-06-07 DIAGNOSIS — R918 Other nonspecific abnormal finding of lung field: Secondary | ICD-10-CM | POA: Insufficient documentation

## 2018-06-17 ENCOUNTER — Ambulatory Visit: Payer: Self-pay | Admitting: Podiatry

## 2018-07-20 ENCOUNTER — Emergency Department: Payer: Medicare Other

## 2018-07-20 ENCOUNTER — Emergency Department
Admission: EM | Admit: 2018-07-20 | Discharge: 2018-07-20 | Disposition: A | Discharge status: Home / Self Care | Payer: Medicare Other | Attending: Emergency Medicine | Admitting: Emergency Medicine

## 2018-07-20 ENCOUNTER — Encounter: Payer: Self-pay | Admitting: Internal Medicine

## 2018-07-20 ENCOUNTER — Other Ambulatory Visit: Payer: Self-pay

## 2018-07-20 DIAGNOSIS — W07XXXA Fall from chair, initial encounter: Secondary | ICD-10-CM | POA: Diagnosis not present

## 2018-07-20 DIAGNOSIS — F17210 Nicotine dependence, cigarettes, uncomplicated: Secondary | ICD-10-CM | POA: Insufficient documentation

## 2018-07-20 DIAGNOSIS — S52501A Unspecified fracture of the lower end of right radius, initial encounter for closed fracture: Secondary | ICD-10-CM | POA: Diagnosis not present

## 2018-07-20 DIAGNOSIS — Z856 Personal history of leukemia: Secondary | ICD-10-CM | POA: Insufficient documentation

## 2018-07-20 DIAGNOSIS — I10 Essential (primary) hypertension: Secondary | ICD-10-CM | POA: Diagnosis not present

## 2018-07-20 DIAGNOSIS — Z85828 Personal history of other malignant neoplasm of skin: Secondary | ICD-10-CM | POA: Diagnosis not present

## 2018-07-20 DIAGNOSIS — S6991XA Unspecified injury of right wrist, hand and finger(s), initial encounter: Secondary | ICD-10-CM | POA: Diagnosis present

## 2018-07-20 DIAGNOSIS — Y998 Other external cause status: Secondary | ICD-10-CM | POA: Insufficient documentation

## 2018-07-20 DIAGNOSIS — Z7902 Long term (current) use of antithrombotics/antiplatelets: Secondary | ICD-10-CM | POA: Diagnosis not present

## 2018-07-20 DIAGNOSIS — S52611A Displaced fracture of right ulna styloid process, initial encounter for closed fracture: Secondary | ICD-10-CM | POA: Diagnosis not present

## 2018-07-20 DIAGNOSIS — I252 Old myocardial infarction: Secondary | ICD-10-CM | POA: Insufficient documentation

## 2018-07-20 DIAGNOSIS — I2510 Atherosclerotic heart disease of native coronary artery without angina pectoris: Secondary | ICD-10-CM | POA: Insufficient documentation

## 2018-07-20 DIAGNOSIS — Y9389 Activity, other specified: Secondary | ICD-10-CM | POA: Diagnosis not present

## 2018-07-20 DIAGNOSIS — F17228 Nicotine dependence, chewing tobacco, with other nicotine-induced disorders: Secondary | ICD-10-CM | POA: Insufficient documentation

## 2018-07-20 DIAGNOSIS — Z7982 Long term (current) use of aspirin: Secondary | ICD-10-CM | POA: Insufficient documentation

## 2018-07-20 DIAGNOSIS — Y929 Unspecified place or not applicable: Secondary | ICD-10-CM | POA: Diagnosis not present

## 2018-07-20 DIAGNOSIS — W19XXXA Unspecified fall, initial encounter: Secondary | ICD-10-CM

## 2018-07-20 DIAGNOSIS — Z79899 Other long term (current) drug therapy: Secondary | ICD-10-CM | POA: Diagnosis not present

## 2018-07-20 MED ORDER — HYDROCODONE-ACETAMINOPHEN 5-325 MG PO TABS
1.0000 | ORAL_TABLET | Freq: Once | ORAL | Status: AC
  Administered 2018-07-20: 1 via ORAL
  Filled 2018-07-20: qty 1

## 2018-07-20 MED ORDER — HYDROCODONE-ACETAMINOPHEN 5-325 MG PO TABS: 1.0000 | ORAL_TABLET | Freq: Three times a day (TID) | ORAL | 0 refills | Status: DC | PRN

## 2018-07-20 NOTE — Discharge Instructions (Addendum)
You have been diagnosed with a distal radius and ulnar styloid fracture.  We have placed you in a splint.  Have provided you with a prescription for hydrocodone to take every 8 hours as needed for pain.  I need you to follow-up with Dr. Marry Guan on Monday morning.

## 2018-07-20 NOTE — ED Notes (Signed)
Esign not working pt verbalized discharge instructions and has no questions at this time 

## 2018-07-20 NOTE — ED Triage Notes (Signed)
FIRST NURSE NOTE-playing musical chairs and fell on right wrist. Ambulatory.

## 2018-07-20 NOTE — ED Provider Notes (Signed)
[facility] Emergency Department Provider Note ____________________________________________  Time seen: 2050  I have reviewed the triage vital signs and the nursing notes.  HISTORY  Chief Complaint  Wrist Pain   HPI Karen [name] is a 72 y.o. female presents to the ER today with complaint of right wrist pain status post falling off a chair.  She reports this occurred earlier today about 7 PM.  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  She describes the right wrist pain as throbbing.  The pain does not radiate.  She reports associated numbness and weakness but no tingling.  She has noticed some swelling but no bruising.  She did not take anything prior to arrival.  She is unsure if she has osteoporosis or not.  Past Medical History:  Diagnosis Date  . Anxiety   . Artery occlusion   . Back pain   . Bronchitis   . Cancer ([place])    Skin Cancer  . CLL (chronic lymphocytic leukemia) ([place])   . Coronary artery disease   . Depression   . Dyspnea   . GERD (gastroesophageal reflux disease)   . Hypercholesteremia   . Hyperlipemia   . Hypertension   . Myocardial infarction ([place]) 1999  . Neuropathy   . Restless leg syndrome   . Sleep apnea    use C-PAP  . Wears dentures    full upper, partial lower.  doesn't currently wear    Patient Active Problem List   Diagnosis Date Noted  . CLL (chronic lymphocytic leukemia) ([place]) 12/18/2017  . Unstable angina ([place]) 06/22/2017  . Inclusion cyst of vulva 12/14/2016  . Lumbar spondylosis 08/14/2016  . Trochanteric bursitis of right hip 08/14/2016  . Herpes simplex virus (HSV) infection 12/09/2015  . History of total vaginal hysterectomy (TVH) 11/25/2015  . Vaginal odor 11/25/2015  . History of UTI 11/25/2015  . Vaginal atrophy 11/25/2015  . Dyspareunia in female 11/25/2015  . Tendinitis of wrist 05/26/2015    Past Surgical History:  Procedure  Laterality Date  . COLONOSCOPY WITH PROPOFOL N/A 11/20/2016   Procedure: COLONOSCOPY WITH PROPOFOL;  Surgeon: Lucilla Lame, MD;  Location: [place];  Service: Endoscopy;  Laterality: N/A;  sleep apnea  . CORONARY ANGIOPLASTY WITH STENT PLACEMENT    . DILATION AND CURETTAGE OF UTERUS    . EYE SURGERY Bilateral    Cataract Extraction with IOL  . LEFT HEART CATH AND CORONARY ANGIOGRAPHY N/A 06/29/2017   Procedure: LEFT HEART CATH AND CORONARY ANGIOGRAPHY;  Surgeon: Dionisio David, MD;  Location: [place] [facility];  Service: Cardiovascular;  Laterality: N/A;  . POLYPECTOMY  11/20/2016   Procedure: POLYPECTOMY;  Surgeon: Lucilla Lame, MD;  Location: [place];  Service: Endoscopy;;  . TUBAL LIGATION    . VAGINAL HYSTERECTOMY    . VULVECTOMY Right 01/31/2016   Procedure: WIDE EXCISION VULVECTOMY-RIGHT LABIA;  Surgeon: Brayton Mars, MD;  Location: [facility];  Service: Gynecology;  Laterality: Right;    Prior to Admission medications   Medication Sig Start Date End Date Taking? Authorizing Provider  albuterol (PROVENTIL HFA;VENTOLIN HFA) 108 (90 BASE) MCG/ACT inhaler Inhale 2 puffs into the lungs every 4 (four) hours as needed for wheezing or shortness of breath. 07/06/15   Cuthriell, Charline Bills, PA-C  albuterol (PROVENTIL) (2.5 MG/3ML) 0.083% nebulizer solution Take 3 mLs (2.5 mg total) by nebulization every 4 (four) hours as needed for wheezing or  shortness of breath. 05/09/18   Paulette Blanch, MD  alendronate (FOSAMAX) 70 MG tablet alendronate 70 mg tablet  TAKE 1 TABLET BY MOUTH ONCE A WEEK    [provider]  ALPRAZolam (XANAX) 1 MG tablet alprazolam 1 mg tablet  TAKE 1 TABLET BY MOUTH 3 TIMES A DAY AS NEEDED    [provider]  amLODipine (NORVASC) 10 MG tablet Take 10 mg by mouth daily.    [provider]  aspirin EC 81 MG tablet Take 81 mg by mouth daily.    [provider]  brompheniramine-pseudoephedrine-DM 30-2-10 MG/5ML syrup  brompheniramine-pseudoephedrine-DM 2 mg-30 mg-10 mg/5 mL oral syrup  TAKE 5 MLS( 1 TEASPOONFUL) BY MOUTH 4 TIMES A DAY AS NEEDED    [provider]  Calcium Carb-Cholecalciferol 600-800 MG-UNIT TABS calcium carbonate-vitamin D3 600 mg (1,500 mg)-800 unit tablet  TAKE 1 TABLET BY MOUTH TWO TIMES A DAY    [provider]  chlorpheniramine-HYDROcodone (TUSSIONEX PENNKINETIC ER) 10-8 MG/5ML SUER Take 5 mLs by mouth 2 (two) times daily. 05/09/18   Paulette Blanch, MD  chlorthalidone (HYGROTON) 25 MG tablet Take 25 mg by mouth daily.    [provider]  clopidogrel (PLAVIX) 75 MG tablet Take 75 mg by mouth daily.     [provider]  dexlansoprazole (DEXILANT) 60 MG capsule Take 60 mg by mouth daily.     [provider]  DULoxetine (CYMBALTA) 60 MG capsule Take 60 mg by mouth daily.    [provider]  furosemide (LASIX) 20 MG tablet Take 20 mg by mouth daily. 12/20/17   [provider]  gabapentin (NEURONTIN) 400 MG capsule Take 400 mg by mouth 2 (two) times daily.     [provider]  HYDROcodone-acetaminophen (NORCO/VICODIN) 5-325 MG tablet Take 1 tablet by mouth every 8 (eight) hours as needed for moderate pain. 07/20/18 07/20/19  Jearld Fenton, NP  hydrOXYzine (VISTARIL) 50 MG capsule hydroxyzine pamoate 50 mg capsule  TAKE 1 TO 2 CAPSULES BY MOUTH EVERY NIGHT AT BEDTIME    [provider]  Influenza vac split quadrivalent PF (FLUZONE HIGH-DOSE) 0.5 ML injection Fluzone High-Dose 2016-2017 (PF) 180 mcg/0.5 mL intramuscular syringe  inject 0.5 milliliter intramuscularly    [provider]  isosorbide dinitrate (ISORDIL) 30 MG tablet Take 30 mg by mouth daily.    [provider]  levothyroxine (SYNTHROID, LEVOTHROID) 25 MCG tablet levothyroxine 25 mcg tablet  TAKE 1 TABLET BY MOUTH EVERY MORNING    [provider]  lisinopril (PRINIVIL,ZESTRIL) 40 MG tablet Take 40 mg by mouth daily.    [provider]  loratadine (CLARITIN) 10 MG tablet loratadine 10 mg tablet  TAKE 1 TABLET BY MOUTH EVERY DAY    [provider]  metoprolol succinate (TOPROL-XL) 50 MG 24 hr tablet Take 50 mg by mouth daily. Take with or immediately following a meal.    [provider]  Nebulizers (COMPRESSOR/NEBULIZER) MISC 1 Units by Does not apply route every 4 (four) hours as needed. 05/09/18   Paulette Blanch, MD  nitroGLYCERIN (NITROSTAT) 0.4 MG SL tablet Place 0.4 mg under the tongue every 5 (five) minutes as needed for chest pain.     [provider]  pneumococcal 23 valent vaccine (PNEUMOVAX 23) 25 MCG/0.5ML injection Pneumovax 23 25 mcg/0.5 mL injection syringe  inject 0.5 milliliter intramuscularly    [provider]  potassium chloride (K-DUR,KLOR-CON) 10 MEQ tablet Take 10 mEq by mouth daily.  [provider]  predniSONE (DELTASONE) 20 MG tablet prednisone 20 mg tablet  TAKE 1 TABLET BY MOUTH ONCE A DAY    [provider]  PREMARIN vaginal cream PLACE 6.57 (8/4ON) APPLICATORFUL VAGINALLY 2 (TWO) TIMES A WEEK. 04/17/18   Defrancesco, Alanda Slim, MD  ranolazine (RANEXA) 1000 MG SR tablet Ranexa 1,000 mg tablet,extended release  TAKE 1 TABLET BY MOUTH TWICE A DAY    [provider]  rosuvastatin (CRESTOR) 40 MG tablet Crestor 40 mg tablet  TAKE 1 TABLET BY MOUTH AT BEDTIME INSTEAD OF SIMVASTATIN    [provider]  simvastatin (ZOCOR) 40 MG tablet Take 40 mg by mouth daily at 6 PM.     [provider]  traMADol (ULTRAM) 50 MG tablet Take 1 tablet (50 mg total) by mouth every 6 (six) hours as needed. Patient taking differently: Take 50 mg by mouth daily as needed for moderate pain.  12/01/14   Ruffian, III Luanna Cole, PA-C  triamterene-hydrochlorothiazide (MAXZIDE-25) 37.5-25 MG tablet Take by mouth. 11/04/02   [provider]  Zoster Vaccine Live (ZOSTAVAX [place]) Zostavax (PF)    [provider]     Allergies Celebrex [celecoxib]; Requip [ropinirole hcl]; Meloxicam; and Nitrofurantoin  Family History  Problem Relation Age of Onset  . Heart disease Father   . Stroke Mother   . Hypertension Mother   . Lung cancer Brother   . Heart attack Brother   . Congestive Heart Failure Sister   . Cervical cancer Sister   . Cancer Neg Hx   . Diabetes Neg Hx   . Breast cancer Neg Hx     Social History Social History   Tobacco Use  . Smoking status: Current Every Day Smoker    Packs/day: 0.25    Years: 40.00    Pack years: 10.00    Types: Cigarettes  . Smokeless tobacco: Current User    Types: Snuff  Substance Use Topics  . Alcohol use: No    Alcohol/week: 0.0 standard drinks  . Drug use: No    Review of Systems  Constitutional: Negative for fever. Musculoskeletal: Positive for pain in right forearm, right wrist and right hand. Skin: Negative for bruising. Neurological: Positive for weakness and numbness.  Negative for tingling. ____________________________________________  PHYSICAL EXAM:  VITAL SIGNS: ED Triage Vitals  Enc Vitals Group     BP 07/20/18 1946 (!) 147/73     Pulse Rate 07/20/18 1946 (!) 57     Resp 07/20/18 1946 18     Temp 07/20/18 1946 97.8 F (36.6 C)     Temp Source 07/20/18 1946 Oral     SpO2 07/20/18 1946 96 %     Weight 07/20/18 1948 167 lb (75.8 kg)     Height 07/20/18 1948 5\' 2"  (1.575 m)     Head Circumference --      Peak Flow --      Pain Score 07/20/18 1947 10     Pain Loc --      Pain Edu? --      Excl. in [name]? --     Constitutional: Alert and oriented. Well appearing and in no distress. Musculoskeletal: Normal flexion, extension and rotation of the right elbow.  Unable to flex, extend or rotate the right wrist.  Normal flexion and extension of the right fingers.  Swelling noted over the distal forearm and right wrist.  Pain with palpation over the distal forearm and lateral wrist.  1+ swelling noted. Neurologic: Station intact  to  BUE. Skin:  Skin is warm, dry and intact. No oozing noted.  ________________________________________________   RADIOLOGY  Imaging Orders     DG Wrist Complete Right  IMPRESSION: Mildly displaced and comminuted distal right radial fracture. Mildly displaced ulnar styloid fracture. ____________________________________________  PROCEDURES  .Splint Application Date/Time: 29/57/4734 9:33 PM Performed by: Enrique Sack, NT Authorized by: Jearld Fenton, NP   Consent:    Consent obtained:  Verbal   Consent given by:  Patient   Risks discussed:  Discoloration, numbness, pain and swelling   Alternatives discussed:  No treatment and delayed treatment Pre-procedure details:    Sensation:  Normal Procedure details:    Laterality:  Right   Location:  Wrist   Wrist:  R wrist   Strapping: no     Cast type:  Short arm   Splint type:  Volar short arm   Supplies:  Elastic bandage Post-procedure details:    Pain:  Improved   Sensation:  Normal   Patient tolerance of procedure:  Tolerated well, no immediate complications    ____________________________________________  INITIAL IMPRESSION / ASSESSMENT AND PLAN / ED COURSE  Right Forearm Pain, Right Wrist Pain s/p Fall:  Xray right wrist consistent with displaced and comminuted distal right radial fracture, and ulnar styloid fracture. Volar splint applied RX for Norco 5-325 mg tab, 1 tab PO Q8H for pain Follow up with Dr. Marry Guan in 2  days     I reviewed the patient's prescription history over the last 12 months in the multi-state controlled substances database(s) that includes [place], [place], [place], [place], [place], [place], [place], [place], [place], [place], [place], [place], [place], and [place].  Results were notable for Hydrocodone #70, 05/11/18, Tramadol #7 04/12/18. ____________________________________________  FINAL CLINICAL IMPRESSION(S) / ED DIAGNOSES  Final  diagnoses:  Closed fracture of distal end of right radius, unspecified fracture morphology, initial encounter  Closed displaced fracture of styloid process of right ulna, initial encounter  Fall, initial encounter   Webb Silversmith, NP    Jearld Fenton, NP 07/20/18 2142    Nance Pear, MD 07/20/18 2156

## 2018-07-20 NOTE — ED Notes (Signed)
Splint completed by EDT [place] and RN Caryl Pina

## 2018-07-20 NOTE — ED Triage Notes (Signed)
Patient reports she was playing musical chairs and fell out of the chair.  Patient reports right wrist pain.

## 2018-07-27 ENCOUNTER — Other Ambulatory Visit: Payer: Self-pay

## 2018-07-27 ENCOUNTER — Emergency Department
Admission: EM | Admit: 2018-07-27 | Discharge: 2018-07-27 | Disposition: A | Discharge status: Home / Self Care | Payer: Medicare Other | Attending: Student in an Organized Health Care Education/Training Program | Admitting: Student in an Organized Health Care Education/Training Program

## 2018-07-27 ENCOUNTER — Encounter: Payer: Self-pay | Admitting: Emergency Medicine

## 2018-07-27 DIAGNOSIS — Y33XXXD Other specified events, undetermined intent, subsequent encounter: Secondary | ICD-10-CM | POA: Insufficient documentation

## 2018-07-27 DIAGNOSIS — I10 Essential (primary) hypertension: Secondary | ICD-10-CM | POA: Diagnosis not present

## 2018-07-27 DIAGNOSIS — Z7902 Long term (current) use of antithrombotics/antiplatelets: Secondary | ICD-10-CM | POA: Insufficient documentation

## 2018-07-27 DIAGNOSIS — Z85828 Personal history of other malignant neoplasm of skin: Secondary | ICD-10-CM | POA: Diagnosis not present

## 2018-07-27 DIAGNOSIS — Z7982 Long term (current) use of aspirin: Secondary | ICD-10-CM | POA: Insufficient documentation

## 2018-07-27 DIAGNOSIS — I2510 Atherosclerotic heart disease of native coronary artery without angina pectoris: Secondary | ICD-10-CM | POA: Insufficient documentation

## 2018-07-27 DIAGNOSIS — F17210 Nicotine dependence, cigarettes, uncomplicated: Secondary | ICD-10-CM | POA: Insufficient documentation

## 2018-07-27 DIAGNOSIS — Z79899 Other long term (current) drug therapy: Secondary | ICD-10-CM | POA: Insufficient documentation

## 2018-07-27 DIAGNOSIS — S59201D Unspecified physeal fracture of lower end of radius, right arm, subsequent encounter for fracture with routine healing: Secondary | ICD-10-CM | POA: Diagnosis not present

## 2018-07-27 DIAGNOSIS — Z4800 Encounter for change or removal of nonsurgical wound dressing: Secondary | ICD-10-CM | POA: Insufficient documentation

## 2018-07-27 DIAGNOSIS — C9510 Chronic leukemia of unspecified cell type not having achieved remission: Secondary | ICD-10-CM | POA: Insufficient documentation

## 2018-07-27 NOTE — Discharge Instructions (Addendum)
Follow-up with [facility].  Return if worsening

## 2018-07-27 NOTE — ED Triage Notes (Signed)
Pt has known fracture to distal radius on the right, states she needs the splint re-wrapped. Pt states she was seen at [facility] and has next appt on 1/8.  Pt states the splint is causing pain in her arm.

## 2018-07-27 NOTE — ED Provider Notes (Signed)
[facility] Emergency Department Provider Note  ____________________________________________   First MD Initiated Contact with Patient 07/27/18 1323     (approximate)  I have reviewed the triage vital signs and the nursing notes.   HISTORY  Chief Complaint Arm Pain    HPI Karen [name] is a 73 y.o. female presents to the emergency department complaining of the splint that was placed on her distal radius fracture.  She states that the way the splint was applied it is digging into her arm.  She has an appointment with the [facility] but does not want to wait that long to have the splint adjusted.    Past Medical History:  Diagnosis Date  . Anxiety   . Artery occlusion   . Back pain   . Bronchitis   . Cancer ([place])    Skin Cancer  . CLL (chronic lymphocytic leukemia) ([place])   . Coronary artery disease   . Depression   . Dyspnea   . GERD (gastroesophageal reflux disease)   . Hypercholesteremia   . Hyperlipemia   . Hypertension   . Myocardial infarction ([place]) 1999  . Neuropathy   . Restless leg syndrome   . Sleep apnea    use C-PAP  . Wears dentures    full upper, partial lower.  doesn't currently wear    Patient Active Problem List   Diagnosis Date Noted  . CLL (chronic lymphocytic leukemia) ([place]) 12/18/2017  . Unstable angina ([place]) 06/22/2017  . Inclusion cyst of vulva 12/14/2016  . Lumbar spondylosis 08/14/2016  . Trochanteric bursitis of right hip 08/14/2016  . Herpes simplex virus (HSV) infection 12/09/2015  . History of total vaginal hysterectomy (TVH) 11/25/2015  . Vaginal odor 11/25/2015  . History of UTI 11/25/2015  . Vaginal atrophy 11/25/2015  . Dyspareunia in female 11/25/2015  . Tendinitis of wrist 05/26/2015    Past Surgical History:  Procedure Laterality Date  . COLONOSCOPY WITH PROPOFOL N/A 11/20/2016   Procedure: COLONOSCOPY WITH PROPOFOL;  Surgeon: Lucilla Lame, MD;  Location: [place];  Service:  Endoscopy;  Laterality: N/A;  sleep apnea  . CORONARY ANGIOPLASTY WITH STENT PLACEMENT    . DILATION AND CURETTAGE OF UTERUS    . EYE SURGERY Bilateral    Cataract Extraction with IOL  . LEFT HEART CATH AND CORONARY ANGIOGRAPHY N/A 06/29/2017   Procedure: LEFT HEART CATH AND CORONARY ANGIOGRAPHY;  Surgeon: Dionisio David, MD;  Location: [place] [facility];  Service: Cardiovascular;  Laterality: N/A;  . POLYPECTOMY  11/20/2016   Procedure: POLYPECTOMY;  Surgeon: Lucilla Lame, MD;  Location: [place];  Service: Endoscopy;;  . TUBAL LIGATION    . VAGINAL HYSTERECTOMY    . VULVECTOMY Right 01/31/2016   Procedure: WIDE EXCISION VULVECTOMY-RIGHT LABIA;  Surgeon: Brayton Mars, MD;  Location: [facility];  Service: Gynecology;  Laterality: Right;    Prior to Admission medications   Medication Sig Start Date End Date Taking? Authorizing Provider  albuterol (PROVENTIL HFA;VENTOLIN HFA) 108 (90 BASE) MCG/ACT inhaler Inhale 2 puffs into the lungs every 4 (four) hours as needed for wheezing or shortness of breath. 07/06/15   Cuthriell, Charline Bills, PA-C  albuterol (PROVENTIL) (2.5 MG/3ML) 0.083% nebulizer solution Take 3 mLs (2.5 mg total) by nebulization every 4 (four) hours as needed for wheezing or shortness of breath. 05/09/18   Paulette Blanch, MD  alendronate (FOSAMAX) 70 MG tablet alendronate 70 mg tablet  TAKE 1 TABLET BY MOUTH ONCE A WEEK  [provider]  ALPRAZolam (XANAX) 1 MG tablet alprazolam 1 mg tablet  TAKE 1 TABLET BY MOUTH 3 TIMES A DAY AS NEEDED    [provider]  amLODipine (NORVASC) 10 MG tablet Take 10 mg by mouth daily.    [provider]  aspirin EC 81 MG tablet Take 81 mg by mouth daily.    [provider]  brompheniramine-pseudoephedrine-DM 30-2-10 MG/5ML syrup brompheniramine-pseudoephedrine-DM 2 mg-30 mg-10 mg/5 mL oral syrup  TAKE 5 MLS( 1 TEASPOONFUL) BY MOUTH 4 TIMES A DAY AS NEEDED    [provider]  Calcium  Carb-Cholecalciferol 600-800 MG-UNIT TABS calcium carbonate-vitamin D3 600 mg (1,500 mg)-800 unit tablet  TAKE 1 TABLET BY MOUTH TWO TIMES A DAY    [provider]  chlorpheniramine-HYDROcodone (TUSSIONEX PENNKINETIC ER) 10-8 MG/5ML SUER Take 5 mLs by mouth 2 (two) times daily. 05/09/18   Paulette Blanch, MD  chlorthalidone (HYGROTON) 25 MG tablet Take 25 mg by mouth daily.    [provider]  clopidogrel (PLAVIX) 75 MG tablet Take 75 mg by mouth daily.     [provider]  dexlansoprazole (DEXILANT) 60 MG capsule Take 60 mg by mouth daily.     [provider]  DULoxetine (CYMBALTA) 60 MG capsule Take 60 mg by mouth daily.    [provider]  furosemide (LASIX) 20 MG tablet Take 20 mg by mouth daily. 12/20/17   [provider]  gabapentin (NEURONTIN) 400 MG capsule Take 400 mg by mouth 2 (two) times daily.     [provider]  HYDROcodone-acetaminophen (NORCO/VICODIN) 5-325 MG tablet Take 1 tablet by mouth every 8 (eight) hours as needed for moderate pain. 07/20/18 07/20/19  Jearld Fenton, NP  hydrOXYzine (VISTARIL) 50 MG capsule hydroxyzine pamoate 50 mg capsule  TAKE 1 TO 2 CAPSULES BY MOUTH EVERY NIGHT AT BEDTIME    [provider]  Influenza vac split quadrivalent PF (FLUZONE HIGH-DOSE) 0.5 ML injection Fluzone High-Dose 2016-2017 (PF) 180 mcg/0.5 mL intramuscular syringe  inject 0.5 milliliter intramuscularly    [provider]  isosorbide dinitrate (ISORDIL) 30 MG tablet Take 30 mg by mouth daily.    [provider]  levothyroxine (SYNTHROID, LEVOTHROID) 25 MCG tablet levothyroxine 25 mcg tablet  TAKE 1 TABLET BY MOUTH EVERY MORNING    [provider]  lisinopril (PRINIVIL,ZESTRIL) 40 MG tablet Take 40 mg by mouth daily.    [provider]  loratadine (CLARITIN) 10 MG tablet loratadine 10 mg tablet  TAKE 1 TABLET BY MOUTH EVERY DAY    [provider]  metoprolol succinate  (TOPROL-XL) 50 MG 24 hr tablet Take 50 mg by mouth daily. Take with or immediately following a meal.    [provider]  Nebulizers (COMPRESSOR/NEBULIZER) MISC 1 Units by Does not apply route every 4 (four) hours as needed. 05/09/18   Paulette Blanch, MD  nitroGLYCERIN (NITROSTAT) 0.4 MG SL tablet Place 0.4 mg under the tongue every 5 (five) minutes as needed for chest pain.     [provider]  pneumococcal 23 valent vaccine (PNEUMOVAX 23) 25 MCG/0.5ML injection Pneumovax 23 25 mcg/0.5 mL injection syringe  inject 0.5 milliliter intramuscularly    [provider]  potassium chloride (K-DUR,KLOR-CON) 10 MEQ tablet Take 10 mEq by mouth daily.    [provider]  predniSONE (DELTASONE) 20 MG tablet prednisone 20 mg tablet  TAKE 1 TABLET BY MOUTH ONCE A DAY    [provider]  PREMARIN vaginal  cream PLACE 5.95 (6/3OV) APPLICATORFUL VAGINALLY 2 (TWO) TIMES A WEEK. 04/17/18   Defrancesco, Alanda Slim, MD  ranolazine (RANEXA) 1000 MG SR tablet Ranexa 1,000 mg tablet,extended release  TAKE 1 TABLET BY MOUTH TWICE A DAY    [provider]  rosuvastatin (CRESTOR) 40 MG tablet Crestor 40 mg tablet  TAKE 1 TABLET BY MOUTH AT BEDTIME INSTEAD OF SIMVASTATIN    [provider]  simvastatin (ZOCOR) 40 MG tablet Take 40 mg by mouth daily at 6 PM.     [provider]  traMADol (ULTRAM) 50 MG tablet Take 1 tablet (50 mg total) by mouth every 6 (six) hours as needed. Patient taking differently: Take 50 mg by mouth daily as needed for moderate pain.  12/01/14   Ruffian, III Luanna Cole, PA-C  triamterene-hydrochlorothiazide (MAXZIDE-25) 37.5-25 MG tablet Take by mouth. 11/04/02   [provider]  Zoster Vaccine Live (ZOSTAVAX ) Zostavax (PF)    [provider]    Allergies Celebrex [celecoxib]; Requip [ropinirole hcl]; Meloxicam; and Nitrofurantoin  Family History  Problem Relation Age of Onset  . Heart disease Father   . Stroke  Mother   . Hypertension Mother   . Lung cancer Brother   . Heart attack Brother   . Congestive Heart Failure Sister   . Cervical cancer Sister   . Cancer Neg Hx   . Diabetes Neg Hx   . Breast cancer Neg Hx     Social History Social History   Tobacco Use  . Smoking status: Current Every Day Smoker    Packs/day: 0.25    Years: 40.00    Pack years: 10.00    Types: Cigarettes  . Smokeless tobacco: Current User    Types: Snuff  Substance Use Topics  . Alcohol use: No    Alcohol/week: 0.0 standard drinks  . Drug use: No    Review of Systems  Constitutional: No fever/chills Eyes: No visual changes. ENT: No sore throat. Respiratory: Denies cough Genitourinary: Negative for dysuria. Musculoskeletal: Negative for back pain.  Has a distal radius fracture and would like the splint adjusted Skin: Negative for rash.    ____________________________________________   PHYSICAL EXAM:  VITAL SIGNS: ED Triage Vitals  Enc Vitals Group     BP 07/27/18 1309 137/63     Pulse Rate 07/27/18 1309 63     Resp 07/27/18 1309 16     Temp 07/27/18 1309 98.5 F (36.9 C)     Temp Source 07/27/18 1309 Oral     SpO2 07/27/18 1309 94 %     Weight 07/27/18 1307 167 lb (75.8 kg)     Height 07/27/18 1307 5\' 2"  (1.575 m)     Head Circumference --      Peak Flow --      Pain Score 07/27/18 1307 10     Pain Loc --      Pain Edu? --      Excl. in [place]? --     Constitutional: Alert and oriented. Well appearing and in no acute distress. Eyes: Conjunctivae are normal.  Head: Atraumatic. Nose: No congestion/rhinnorhea. Mouth/Throat: Mucous membranes are moist.   Neck:  supple no lymphadenopathy noted Cardiovascular: Normal rate, regular rhythm.  Respiratory: Normal respiratory effort.  No retractions, GU: deferred Musculoskeletal: There is a splint that was applied as a volar and another layer was placed on top as an a sandwich type fashion.  The splint was removed by me.  Skin appears to be  intact  with no wounds noted.  A new splint was reapplied by the tech.  Neurologic:  Normal speech and language.  Skin:  Skin is warm, dry and intact. No rash noted. Psychiatric: Mood and affect are normal. Speech and behavior are normal.  ____________________________________________   LABS (all labs ordered are listed, but only abnormal results are displayed)  Labs Reviewed - No data to display ____________________________________________   ____________________________________________  RADIOLOGY    ____________________________________________   PROCEDURES  Procedure(s) performed: Volar OCL applied by the tech  Procedures    ____________________________________________   INITIAL IMPRESSION / ASSESSMENT AND PLAN / ED COURSE  Pertinent labs & imaging results that were available during my care of the patient were reviewed by me and considered in my medical decision making (see chart for details).   Patient is a 73 year old female presents emergency department requesting to have her splint changed.  Physical exam shows that the splint is actually digging into part of her arm.  Splint was removed and a new volar OCL was applied by the tech.  Patient states she is much more comfortable now.  She was discharged in stable condition.     As part of my medical decision making, I reviewed the following data within the [facility] [place] notes reviewed and incorporated, Old chart reviewed, Notes from prior ED visits and [place] Controlled Substance Database  ____________________________________________   FINAL CLINICAL IMPRESSION(S) / ED DIAGNOSES  Final diagnoses:  Encounter for cast care      NEW MEDICATIONS STARTED DURING THIS VISIT:  Discharge Medication List as of 07/27/2018  1:31 PM       Note:  This document was prepared using Dragon voice recognition software and may include unintentional dictation errors.    Versie Starks, PA-C 07/27/18  1704    Merlyn Lot, MD 07/27/18 1714

## 2018-08-08 ENCOUNTER — Other Ambulatory Visit: Payer: Self-pay | Admitting: Specialist

## 2018-08-11 MED ORDER — CLINDAMYCIN PHOSPHATE 600 MG/50ML IV SOLN
600.0000 mg | INTRAVENOUS | Status: AC
  Administered 2018-08-12: 600 mg via INTRAVENOUS

## 2018-08-11 MED ORDER — CEFAZOLIN SODIUM-DEXTROSE 2-4 GM/100ML-% IV SOLN
2.0000 g | INTRAVENOUS | Status: AC
  Administered 2018-08-12: 2000 mg via INTRAVENOUS

## 2018-08-12 ENCOUNTER — Other Ambulatory Visit: Payer: Self-pay

## 2018-08-12 ENCOUNTER — Ambulatory Visit
Admission: RE | Admit: 2018-08-12 | Discharge: 2018-08-12 | Disposition: A | Discharge status: Home / Self Care | Payer: Medicare Other | Attending: Specialist | Admitting: Specialist

## 2018-08-12 ENCOUNTER — Ambulatory Visit: Payer: Medicare Other | Admitting: Anesthesiology

## 2018-08-12 ENCOUNTER — Encounter
Admission: RE | Disposition: A | Discharge status: Home / Self Care | Payer: Self-pay | Source: Home / Self Care | Attending: Specialist

## 2018-08-12 ENCOUNTER — Encounter: Payer: Self-pay | Admitting: *Deleted

## 2018-08-12 DIAGNOSIS — Z9989 Dependence on other enabling machines and devices: Secondary | ICD-10-CM | POA: Insufficient documentation

## 2018-08-12 DIAGNOSIS — J45909 Unspecified asthma, uncomplicated: Secondary | ICD-10-CM | POA: Diagnosis not present

## 2018-08-12 DIAGNOSIS — Z79899 Other long term (current) drug therapy: Secondary | ICD-10-CM | POA: Insufficient documentation

## 2018-08-12 DIAGNOSIS — G4730 Sleep apnea, unspecified: Secondary | ICD-10-CM | POA: Diagnosis not present

## 2018-08-12 DIAGNOSIS — Z955 Presence of coronary angioplasty implant and graft: Secondary | ICD-10-CM | POA: Diagnosis not present

## 2018-08-12 DIAGNOSIS — W07XXXA Fall from chair, initial encounter: Secondary | ICD-10-CM | POA: Insufficient documentation

## 2018-08-12 DIAGNOSIS — Z85828 Personal history of other malignant neoplasm of skin: Secondary | ICD-10-CM | POA: Diagnosis not present

## 2018-08-12 DIAGNOSIS — K219 Gastro-esophageal reflux disease without esophagitis: Secondary | ICD-10-CM | POA: Insufficient documentation

## 2018-08-12 DIAGNOSIS — Z79890 Hormone replacement therapy (postmenopausal): Secondary | ICD-10-CM | POA: Insufficient documentation

## 2018-08-12 DIAGNOSIS — E785 Hyperlipidemia, unspecified: Secondary | ICD-10-CM | POA: Insufficient documentation

## 2018-08-12 DIAGNOSIS — G629 Polyneuropathy, unspecified: Secondary | ICD-10-CM | POA: Insufficient documentation

## 2018-08-12 DIAGNOSIS — E039 Hypothyroidism, unspecified: Secondary | ICD-10-CM | POA: Insufficient documentation

## 2018-08-12 DIAGNOSIS — Z7902 Long term (current) use of antithrombotics/antiplatelets: Secondary | ICD-10-CM | POA: Diagnosis not present

## 2018-08-12 DIAGNOSIS — Z856 Personal history of leukemia: Secondary | ICD-10-CM | POA: Insufficient documentation

## 2018-08-12 DIAGNOSIS — F419 Anxiety disorder, unspecified: Secondary | ICD-10-CM | POA: Insufficient documentation

## 2018-08-12 DIAGNOSIS — F329 Major depressive disorder, single episode, unspecified: Secondary | ICD-10-CM | POA: Diagnosis not present

## 2018-08-12 DIAGNOSIS — Z7983 Long term (current) use of bisphosphonates: Secondary | ICD-10-CM | POA: Insufficient documentation

## 2018-08-12 DIAGNOSIS — I2510 Atherosclerotic heart disease of native coronary artery without angina pectoris: Secondary | ICD-10-CM | POA: Diagnosis not present

## 2018-08-12 DIAGNOSIS — S52501A Unspecified fracture of the lower end of right radius, initial encounter for closed fracture: Secondary | ICD-10-CM | POA: Insufficient documentation

## 2018-08-12 DIAGNOSIS — E7800 Pure hypercholesterolemia, unspecified: Secondary | ICD-10-CM | POA: Diagnosis not present

## 2018-08-12 DIAGNOSIS — Z7982 Long term (current) use of aspirin: Secondary | ICD-10-CM | POA: Diagnosis not present

## 2018-08-12 DIAGNOSIS — I252 Old myocardial infarction: Secondary | ICD-10-CM | POA: Diagnosis not present

## 2018-08-12 DIAGNOSIS — F17210 Nicotine dependence, cigarettes, uncomplicated: Secondary | ICD-10-CM | POA: Diagnosis not present

## 2018-08-12 DIAGNOSIS — I10 Essential (primary) hypertension: Secondary | ICD-10-CM | POA: Insufficient documentation

## 2018-08-12 DIAGNOSIS — M25531 Pain in right wrist: Secondary | ICD-10-CM | POA: Diagnosis present

## 2018-08-12 HISTORY — DX: Personal history of urinary calculi: Z87.442

## 2018-08-12 HISTORY — DX: Hypothyroidism, unspecified: E03.9

## 2018-08-12 HISTORY — PX: OPEN REDUCTION INTERNAL FIXATION (ORIF) DISTAL RADIAL FRACTURE: SHX5989

## 2018-08-12 HISTORY — DX: Unspecified asthma, uncomplicated: J45.909

## 2018-08-12 LAB — BASIC METABOLIC PANEL
Anion gap: 5 (ref 5–15)
BUN: 12 mg/dL (ref 8–23)
CHLORIDE: 104 mmol/L (ref 98–111)
CO2: 29 mmol/L (ref 22–32)
CREATININE: 1.01 mg/dL — AB (ref 0.44–1.00)
Calcium: 10.1 mg/dL (ref 8.9–10.3)
GFR calc Af Amer: >60 mL/min (ref >60)
GFR calc non Af Amer: 55 mL/min — ABNORMAL LOW (ref >60)
GLUCOSE: 104 mg/dL — AB (ref 70–99)
POTASSIUM: 4 mmol/L (ref 3.5–5.1)
Sodium: 138 mmol/L (ref 135–145)

## 2018-08-12 LAB — POCT I-STAT 4, (NA,K, GLUC, HGB,HCT)
Glucose, Bld: 99 mg/dL (ref 70–99)
HCT: 40 % (ref 36.0–46.0)
Hemoglobin: 13.6 g/dL (ref 12.0–15.0)
Potassium: 4.1 mmol/L (ref 3.5–5.1)
Sodium: 140 mmol/L (ref 135–145)

## 2018-08-12 LAB — CBC
HCT: 42.1 % (ref 36.0–46.0)
HEMOGLOBIN: 13.3 g/dL (ref 12.0–15.0)
MCH: 30.2 pg (ref 26.0–34.0)
MCHC: 31.6 g/dL (ref 30.0–36.0)
MCV: 95.7 fL (ref 80.0–100.0)
Platelets: 288 10*3/uL (ref 150–400)
RBC: 4.4 MIL/uL (ref 3.87–5.11)
RDW: 12.9 % (ref 11.5–15.5)
WBC: 12.7 10*3/uL — AB (ref 4.0–10.5)
nRBC: 0 % (ref 0.0–0.2)

## 2018-08-12 SURGERY — OPEN REDUCTION INTERNAL FIXATION (ORIF) DISTAL RADIUS FRACTURE
Anesthesia: General | Laterality: Right

## 2018-08-12 MED ORDER — LACTATED RINGERS IV SOLN
INTRAVENOUS | Status: DC
  Administered 2018-08-12: 50 mL/h via INTRAVENOUS

## 2018-08-12 MED ORDER — CEFAZOLIN SODIUM-DEXTROSE 2-4 GM/100ML-% IV SOLN
INTRAVENOUS | Status: AC
  Filled 2018-08-12: qty 100

## 2018-08-12 MED ORDER — BACITRACIN 50000 UNITS IM SOLR
INTRAMUSCULAR | Status: AC
  Filled 2018-08-12: qty 1

## 2018-08-12 MED ORDER — IPRATROPIUM-ALBUTEROL 0.5-2.5 (3) MG/3ML IN SOLN
3.0000 mL | Freq: Once | RESPIRATORY_TRACT | Status: DC | PRN
  Administered 2018-08-12: 3 mL via RESPIRATORY_TRACT

## 2018-08-12 MED ORDER — FENTANYL CITRATE (PF) 100 MCG/2ML IJ SOLN
INTRAMUSCULAR | Status: AC
  Administered 2018-08-12: 50 ug via INTRAVENOUS
  Filled 2018-08-12: qty 2

## 2018-08-12 MED ORDER — LIDOCAINE HCL (PF) 1 % IJ SOLN
INTRAMUSCULAR | Status: AC
  Filled 2018-08-12: qty 5

## 2018-08-12 MED ORDER — FENTANYL CITRATE (PF) 100 MCG/2ML IJ SOLN: 25.0000 ug | INTRAMUSCULAR | Status: DC | PRN

## 2018-08-12 MED ORDER — MIDAZOLAM HCL 2 MG/2ML IJ SOLN
1.0000 mg | Freq: Once | INTRAMUSCULAR | Status: AC
  Administered 2018-08-12: 1 mg via INTRAVENOUS

## 2018-08-12 MED ORDER — EPHEDRINE SULFATE 50 MG/ML IJ SOLN
INTRAMUSCULAR | Status: AC
  Filled 2018-08-12: qty 1

## 2018-08-12 MED ORDER — LIDOCAINE HCL (PF) 2 % IJ SOLN
INTRAMUSCULAR | Status: AC
  Filled 2018-08-12: qty 10

## 2018-08-12 MED ORDER — PHENYLEPHRINE HCL 10 MG/ML IJ SOLN
INTRAMUSCULAR | Status: DC | PRN
  Administered 2018-08-12: 50 ug via INTRAVENOUS
  Administered 2018-08-12: 100 ug via INTRAVENOUS

## 2018-08-12 MED ORDER — LIDOCAINE HCL (CARDIAC) PF 100 MG/5ML IV SOSY
PREFILLED_SYRINGE | INTRAVENOUS | Status: DC | PRN
  Administered 2018-08-12: 60 mg via INTRATRACHEAL

## 2018-08-12 MED ORDER — MIDAZOLAM HCL 2 MG/2ML IJ SOLN
INTRAMUSCULAR | Status: AC
  Administered 2018-08-12: 1 mg via INTRAVENOUS
  Filled 2018-08-12: qty 2

## 2018-08-12 MED ORDER — CHLORHEXIDINE GLUCONATE CLOTH 2 % EX PADS: 6.0000 | MEDICATED_PAD | Freq: Once | CUTANEOUS | Status: DC

## 2018-08-12 MED ORDER — FAMOTIDINE 20 MG PO TABS
ORAL_TABLET | ORAL | Status: AC
  Filled 2018-08-12: qty 1

## 2018-08-12 MED ORDER — PROPOFOL 10 MG/ML IV BOLUS
INTRAVENOUS | Status: AC
  Filled 2018-08-12: qty 20

## 2018-08-12 MED ORDER — PROPOFOL 10 MG/ML IV BOLUS
INTRAVENOUS | Status: DC | PRN
  Administered 2018-08-12: 30 mg via INTRAVENOUS

## 2018-08-12 MED ORDER — IPRATROPIUM-ALBUTEROL 0.5-2.5 (3) MG/3ML IN SOLN
RESPIRATORY_TRACT | Status: AC
  Administered 2018-08-12: 3 mL via RESPIRATORY_TRACT
  Filled 2018-08-12: qty 3

## 2018-08-12 MED ORDER — PROPOFOL 500 MG/50ML IV EMUL
INTRAVENOUS | Status: DC | PRN
  Administered 2018-08-12: 125 ug/kg/min via INTRAVENOUS

## 2018-08-12 MED ORDER — PROPOFOL 500 MG/50ML IV EMUL
INTRAVENOUS | Status: AC
  Filled 2018-08-12: qty 50

## 2018-08-12 MED ORDER — GABAPENTIN 300 MG PO CAPS: 300.0000 mg | ORAL_CAPSULE | ORAL | Status: DC

## 2018-08-12 MED ORDER — BUPIVACAINE HCL (PF) 0.5 % IJ SOLN
INTRAMUSCULAR | Status: DC | PRN
  Administered 2018-08-12: 27 mL

## 2018-08-12 MED ORDER — SODIUM CHLORIDE 0.9 % IV SOLN
INTRAVENOUS | Status: DC | PRN
  Administered 2018-08-12: 15:00:00

## 2018-08-12 MED ORDER — CLINDAMYCIN PHOSPHATE 600 MG/50ML IV SOLN
INTRAVENOUS | Status: AC
  Filled 2018-08-12: qty 50

## 2018-08-12 MED ORDER — BUPIVACAINE HCL (PF) 0.5 % IJ SOLN
INTRAMUSCULAR | Status: AC
  Filled 2018-08-12: qty 30

## 2018-08-12 MED ORDER — HYDROCODONE-ACETAMINOPHEN 7.5-325 MG PO TABS: 1.0000 | ORAL_TABLET | Freq: Four times a day (QID) | ORAL | 0 refills | Status: DC | PRN

## 2018-08-12 MED ORDER — LIDOCAINE HCL (PF) 1 % IJ SOLN
INTRAMUSCULAR | Status: DC | PRN
  Administered 2018-08-12: 1 mL via INTRADERMAL

## 2018-08-12 MED ORDER — ROPIVACAINE HCL 5 MG/ML IJ SOLN
INTRAMUSCULAR | Status: AC
  Filled 2018-08-12: qty 30

## 2018-08-12 MED ORDER — ROPIVACAINE HCL 5 MG/ML IJ SOLN
INTRAMUSCULAR | Status: DC | PRN
  Administered 2018-08-12: 10 mL via INTRATHECAL
  Administered 2018-08-12: 20 mL via PERINEURAL

## 2018-08-12 MED ORDER — SODIUM CHLORIDE 0.9 % IV SOLN
INTRAVENOUS | Status: DC | PRN
  Administered 2018-08-12: 20 ug/min via INTRAVENOUS

## 2018-08-12 MED ORDER — EPHEDRINE SULFATE 50 MG/ML IJ SOLN
INTRAMUSCULAR | Status: DC | PRN
  Administered 2018-08-12: 5 mg via INTRAVENOUS
  Administered 2018-08-12: 10 mg via INTRAVENOUS
  Administered 2018-08-12 (×2): 5 mg via INTRAVENOUS

## 2018-08-12 MED ORDER — OXYCODONE HCL 5 MG PO TABS: 5.0000 mg | ORAL_TABLET | Freq: Once | ORAL | Status: DC | PRN

## 2018-08-12 MED ORDER — FENTANYL CITRATE (PF) 100 MCG/2ML IJ SOLN
50.0000 ug | Freq: Once | INTRAMUSCULAR | Status: AC
  Administered 2018-08-12: 50 ug via INTRAVENOUS

## 2018-08-12 MED ORDER — OXYCODONE HCL 5 MG/5ML PO SOLN: 5.0000 mg | Freq: Once | ORAL | Status: DC | PRN

## 2018-08-12 SURGICAL SUPPLY — 50 items
BIT DRILL 2 FAST STEP (BIT) ×2 IMPLANT
BIT DRILL 2.5X4 QC (BIT) ×2 IMPLANT
BLADE SURG MINI STRL (BLADE) ×3 IMPLANT
BNDG COHESIVE 4X5 TAN STRL (GAUZE/BANDAGES/DRESSINGS) IMPLANT
BNDG ESMARK 4X12 TAN STRL LF (GAUZE/BANDAGES/DRESSINGS) ×3 IMPLANT
CANISTER SUCT 1200ML W/VALVE (MISCELLANEOUS) ×3 IMPLANT
CHLORAPREP W/TINT 26ML (MISCELLANEOUS) ×3 IMPLANT
COVER WAND RF STERILE (DRAPES) ×1 IMPLANT
CUFF TOURN 18 STER (MISCELLANEOUS) ×2 IMPLANT
DRAPE FLUOR MINI C-ARM 54X84 (DRAPES) ×3 IMPLANT
DRSG GAUZE FLUFF 36X18 (GAUZE/BANDAGES/DRESSINGS) ×3 IMPLANT
ELECT REM PT RETURN 9FT ADLT (ELECTROSURGICAL) ×3
ELECTRODE REM PT RTRN 9FT ADLT (ELECTROSURGICAL) ×1 IMPLANT
GAUZE PETRO XEROFOAM 1X8 (MISCELLANEOUS) ×3 IMPLANT
GAUZE SPONGE 4X4 12PLY STRL (GAUZE/BANDAGES/DRESSINGS) ×3 IMPLANT
GLOVE INDICATOR 8.0 STRL GRN (GLOVE) ×7 IMPLANT
GLOVE SURG ORTHO 8.5 STRL (GLOVE) ×7 IMPLANT
GOWN STRL REUS W/ TWL LRG LVL3 (GOWN DISPOSABLE) ×1 IMPLANT
GOWN STRL REUS W/TWL LRG LVL3 (GOWN DISPOSABLE) ×6
GOWN STRL REUS W/TWL LRG LVL4 (GOWN DISPOSABLE) ×3 IMPLANT
K-WIRE 1.6 (WIRE) ×4
K-WIRE FX5X1.6XNS BN SS (WIRE) ×2
KIT TURNOVER KIT A (KITS) ×3 IMPLANT
KWIRE FX5X1.6XNS BN SS (WIRE) IMPLANT
NDL SAFETY ECLIPSE 18X1.5 (NEEDLE) ×1 IMPLANT
NEEDLE HYPO 18GX1.5 SHARP (NEEDLE)
NS IRRIG 500ML POUR BTL (IV SOLUTION) ×3 IMPLANT
PACK EXTREMITY ARMC (MISCELLANEOUS) ×3 IMPLANT
PADDING CAST 4IN STRL (MISCELLANEOUS) ×4
PADDING CAST BLEND 4X4 STRL (MISCELLANEOUS) ×2 IMPLANT
PEG SUBCHONDRAL SMOOTH 2.0X22 (Peg) ×2 IMPLANT
PEG SUBCHONDRAL SMOOTH 2.0X24 (Peg) ×2 IMPLANT
PEG THREADED 2.5MMX22MM LONG (Peg) ×2 IMPLANT
PEG THREADED 2.5MMX24MM LONG (Peg) ×6 IMPLANT
PEG THREADED 2.5MMX26MM LONG (Peg) ×2 IMPLANT
PLATE SHORT 24.4X51.3 RT (Plate) ×2 IMPLANT
SCREW BN 12X3.5XNS CORT TI (Screw) IMPLANT
SCREW CORT 3.5X12 (Screw) ×2 IMPLANT
SCREW CORT 3.5X14 LNG (Screw) ×4 IMPLANT
SPLINT CAST 1 STEP 3X12 (MISCELLANEOUS) ×3 IMPLANT
SPONGE LAP 18X18 RF (DISPOSABLE) ×3 IMPLANT
STAPLER SKIN PROX 35W (STAPLE) ×3 IMPLANT
STOCKINETTE 48X4 2 PLY STRL (GAUZE/BANDAGES/DRESSINGS) ×1 IMPLANT
STOCKINETTE BIAS CUT 4 980044 (GAUZE/BANDAGES/DRESSINGS) ×3 IMPLANT
STOCKINETTE STRL 4IN 9604848 (GAUZE/BANDAGES/DRESSINGS) ×3 IMPLANT
SUT VIC AB 2-0 SH 27 (SUTURE) ×2
SUT VIC AB 2-0 SH 27XBRD (SUTURE) ×1 IMPLANT
SUT VIC AB 3-0 PS2 18 (SUTURE) ×3 IMPLANT
SUT VIC AB 3-0 SH 27 (SUTURE) ×2
SUT VIC AB 3-0 SH 27X BRD (SUTURE) ×1 IMPLANT

## 2018-08-12 NOTE — H&P (Signed)
THE PATIENT WAS SEEN PRIOR TO SURGERY TODAY.  HISTORY, ALLERGIES, HOME MEDICATIONS AND OPERATIVE PROCEDURE WERE REVIEWED. RISKS AND BENEFITS OF SURGERY DISCUSSED WITH PATIENT AGAIN.  NO CHANGES FROM INITIAL HISTORY AND PHYSICAL NOTED.    

## 2018-08-12 NOTE — Transfer of Care (Signed)
Immediate Anesthesia Transfer of Care Note  Patient: Karen [name]  Procedure(s) Performed: OPEN REDUCTION INTERNAL FIXATION (ORIF) DISTAL RADIAL FRACTURE (Right )  Patient Location: PACU  Anesthesia Type:General  Level of Consciousness: awake, alert  and oriented  Airway & Oxygen Therapy: Patient Spontanous Breathing  Post-op Assessment: Report given to RN and Post -op Vital signs reviewed and stable  Post vital signs: Reviewed and stable  Last Vitals:  Vitals Value Taken Time  BP 145/84 08/12/2018  4:25 PM  Temp    Pulse 57 08/12/2018  4:25 PM  Resp 14 08/12/2018  4:25 PM  SpO2 96 % 08/12/2018  4:25 PM  Vitals shown include unvalidated device data.  Last Pain:  Vitals:   08/12/18 1625  TempSrc:   PainSc: 0-No pain      Patients Stated Pain Goal: 1 (76/72/09 4709)  Complications: No apparent anesthesia complications

## 2018-08-12 NOTE — Discharge Instructions (Signed)

## 2018-08-12 NOTE — Anesthesia Preprocedure Evaluation (Signed)
Anesthesia Evaluation  Patient identified by MRN, date of birth, ID band Patient awake    Reviewed: Allergy & Precautions, H&P , NPO status , Patient's Chart, lab work & pertinent test results  History of Anesthesia Complications Negative for: history of anesthetic complications  Airway Mallampati: III  TM Distance: <3 FB Neck ROM: limited    Dental  (+) Chipped, Poor Dentition, Missing   Pulmonary neg shortness of breath, asthma , sleep apnea , Current Smoker,           Cardiovascular Exercise Tolerance: Good hypertension, (-) angina+ CAD, + Past MI and + Cardiac Stents       Neuro/Psych PSYCHIATRIC DISORDERS negative neurological ROS     GI/Hepatic Neg liver ROS, GERD  Medicated and Controlled,  Endo/Other  Hypothyroidism   Renal/GU negative Renal ROS  negative genitourinary   Musculoskeletal  (+) Arthritis ,   Abdominal   Peds  Hematology negative hematology ROS (+)   Anesthesia Other Findings Past Medical History: No date: Anxiety No date: Artery occlusion No date: Asthma No date: Back pain No date: Bronchitis No date: Cancer ([place])     Comment:  Skin Cancer No date: CLL (chronic lymphocytic leukemia) ([facility]) No date: Coronary artery disease No date: Depression No date: Dyspnea No date: GERD (gastroesophageal reflux disease) No date: History of kidney stones No date: Hypercholesteremia No date: Hyperlipemia No date: Hypertension No date: Hypothyroidism 1999: Myocardial infarction ([place]) No date: Neuropathy No date: Restless leg syndrome No date: Sleep apnea     Comment:  use C-PAP No date: Wears dentures     Comment:  full upper, partial lower.  doesn't currently wear  Past Surgical History: 11/20/2016: COLONOSCOPY WITH PROPOFOL; N/A     Comment:  Procedure: COLONOSCOPY WITH PROPOFOL;  Surgeon: Lucilla Lame, MD;  Location: [facility];  Service:               Endoscopy;   Laterality: N/A;  sleep apnea 2008: CORONARY ANGIOPLASTY WITH STENT PLACEMENT No date: DILATION AND CURETTAGE OF UTERUS No date: EYE SURGERY; Bilateral     Comment:  Cataract Extraction with IOL 12/2017: HAND SURGERY; Left     Comment:  upper hand fracture repair 06/29/2017: LEFT HEART CATH AND CORONARY ANGIOGRAPHY; N/A     Comment:  Procedure: LEFT HEART CATH AND CORONARY ANGIOGRAPHY;                Surgeon: Dionisio David, MD;  Location: [facility];  Service: Cardiovascular;  Laterality: N/A; 11/20/2016: POLYPECTOMY     Comment:  Procedure: POLYPECTOMY;  Surgeon: Lucilla Lame, MD;                Location: [facility];  Service: Endoscopy;; No date: TUBAL LIGATION No date: VAGINAL HYSTERECTOMY 01/31/2016: VULVECTOMY; Right     Comment:  Procedure: WIDE EXCISION VULVECTOMY-RIGHT LABIA;                Surgeon: Brayton Mars, MD;  Location: [facility];                Service: Gynecology;  Laterality: Right;  BMI    Body Mass Index:  30.52 kg/m      Reproductive/Obstetrics negative OB ROS  Anesthesia Physical Anesthesia Plan  ASA: III  Anesthesia Plan: General   Post-op Pain Management: GA combined w/ Regional for post-op pain   Induction: Intravenous  PONV Risk Score and Plan: Propofol infusion and TIVA  Airway Management Planned: Natural Airway and Nasal Cannula  Additional Equipment:   Intra-op Plan:   Post-operative Plan:   Informed Consent: I have reviewed the patients History and Physical, chart, labs and discussed the procedure including the risks, benefits and alternatives for the proposed anesthesia with the patient or authorized representative who has indicated his/her understanding and acceptance.     Dental Advisory Given  Plan Discussed with: Anesthesiologist, CRNA and Surgeon  Anesthesia Plan Comments: (Patient consented for risks of anesthesia including but not  limited to:  - adverse reactions to medications - risk of intubation if required - damage to teeth, lips or other oral mucosa - sore throat or hoarseness - Damage to heart, brain, lungs or loss of life  Patient voiced understanding.)        Anesthesia Quick Evaluation

## 2018-08-12 NOTE — Op Note (Signed)
08/12/2018  4:18 PM  PATIENT:  Karen [name]    PRE-OPERATIVE DIAGNOSIS:  CLOSED FRACTURE OF DISTAL END OF RADIUS-RIGHT  POST-OPERATIVE DIAGNOSIS:  Same  PROCEDURE:  OPEN REDUCTION INTERNAL FIXATION (ORIF) DISTAL RADIAL FRACTURE  SURGEON:  Park Breed, MD  TOURNIQUET TIME: 58 MIN  ANESTHESIA:   General  PREOPERATIVE INDICATIONS:  Karen [name] is a  73 y.o. female with a diagnosis of CLOSED FRACTURE OF DISTAL END OF RADIUS-RIGHT who failed conservative measures and elected for surgical management.    The risks benefits and alternatives were discussed with the patient preoperatively including but not limited to the risks of infection, bleeding, nerve injury, malunion, nonunion, wrist stiffness, persistent wrist pain, osteoarthritis and the need for further surgery. Medical risks include but are not limited to DVT and pulmonary embolism, myocardial infarction, stroke, pneumonia, respiratory failure and death. Patient  understood these risks and wished to proceed.   OPERATIVE IMPLANTS: [employer] hand innovations plate, 3 hole  OPERATIVE FINDINGS: The fracture had early healing and was volarly displaced and shortened.  OPERATIVE PROCEDURE: Patient was seen in the preoperative area. I marked the operative hand with the word yes and my initials according the hospital's correct site of surgery protocol. Patient was then brought to the operating roomand was placed supine on the operative table and underwent general anesthesia with an LMA.   The operative arm was prepped and draped in a sterile fashion. A timeout performed to verify the patient's name, date of birth, medical record number, correct site of surgery correct procedure to be performed. The timeout was also used a timeout to verify patient received antibiotics and appropriate instruments, implants and radiographs studies were available in the room. Once all in attendance were in agreement case began.   Patient then had the operative  extremity exsanguinated with an Esmarch. The tourniquet was placed on the upper extremity and inflated 250 mm.  A manual reduction of the fracture was performed. The fracture reduction was confirmed on FluoroScan imaging.  A linear incision was then made over the FCR tendon. The subcutaneous tissue was carefully dissected using Metzenbaum scissor and Adson pickup. Retractors were used to protect the radial artery and median nerve. The pronator quadratus was identified and incised and elevated off the volar surface of the distal radius.  There was extensive early callus present which was debrided.  An osteotome was used to open up the fracture site to correct the volar disc deformity and regain more length.  A 3  hole [employer] volar plate was then positioned on the under surface of the distal radius.   It was held into position with a K wire. The position of the plate was confirmed on AP and lateral images.    2 peg holes were drilled and then measured with a depth gauge and smooth pegs inserted.  The fluoroscopy showed the distal portion of the plate to be well placed on the distal fragment.  The plate was then aligned to the shaft to provide proper alignment and length.  The 3 cortical holes in the shaft portion were drilled and filled with cortical screws.  The remaining peg holes were drilled and filled with pegs and partially-threaded screws.  The position and length of all screws were confirmed on AP and lateral FluoroScan imaging. Care was taken to avoid penetration of any peg through the articular surface of the distal radius.  The wound was then copiously irrigated. Final FluoroScan imaging of the construct were taken. The fracture  was in anatomic position and the hardware was well-positioned. The wound again was copiously irrigated. The soft tissue was then carefully over the plate. The tissues were infiltrated with 1/2% marcaine.  The skin was closed with staples. Xeroform and a dry sterile  dressing were applied along with a volar splint. I was scrubbed and present for the entire case and all sharp and instrument counts were correct at the conclusion the case. The patient tolerated this procedure well and was awakened and taken to the recovery room in good condition.   [name], MD

## 2018-08-12 NOTE — Anesthesia Procedure Notes (Signed)
Anesthesia Regional Block: Supraclavicular block   Pre-Anesthetic Checklist: ,, timeout performed, Correct Patient, Correct Site, Correct Laterality, Correct Procedure, Correct Position, site marked, Risks and benefits discussed,  Surgical consent,  Pre-op evaluation,  At surgeon's request and post-op pain management  Laterality: Upper and Right  Prep: chloraprep       Needles:  Injection technique: Single-shot  Needle Type: Stimiplex     Needle Length: 5cm  Needle Gauge: 22     Additional Needles:   Procedures:,,,, ultrasound used (permanent image in chart),,,,  Narrative:  Start time: 08/12/2018 11:48 AM End time: 08/12/2018 11:55 AM Injection made incrementally with aspirations every 5 mL.  Performed by: Personally  Anesthesiologist: Jerald Villalona, Precious Haws, MD  Additional Notes: Patient consented for risk and benefits of nerve block including but not limited to nerve damage, failed block, bleeding and infection.  Patient voiced understanding.  Functioning IV was confirmed and monitors were applied.  A 54mm 22ga Stimuplex needle was used. Sterile prep,hand hygiene and sterile gloves were used.  Minimal sedation used for procedure.  No paresthesia endorsed by patient during the procedure.  Negative aspiration and negative test dose prior to incremental administration of local anesthetic. The patient tolerated the procedure well with no immediate complications.

## 2018-08-12 NOTE — Progress Notes (Signed)
Elevated hand on pillows

## 2018-08-12 NOTE — Anesthesia Post-op Follow-up Note (Signed)
Anesthesia QCDR form completed.        

## 2018-08-13 ENCOUNTER — Encounter: Payer: Self-pay | Admitting: Specialist

## 2018-08-13 NOTE — Anesthesia Postprocedure Evaluation (Signed)
Anesthesia Post Note  Patient: Karen [name]  Procedure(s) Performed: OPEN REDUCTION INTERNAL FIXATION (ORIF) DISTAL RADIAL FRACTURE (Right )  Patient location during evaluation: PACU Anesthesia Type: General Level of consciousness: awake and alert Pain management: pain level controlled Vital Signs Assessment: post-procedure vital signs reviewed and stable Respiratory status: spontaneous breathing, nonlabored ventilation, respiratory function stable and patient connected to nasal cannula oxygen Cardiovascular status: blood pressure returned to baseline and stable Postop Assessment: no apparent nausea or vomiting Anesthetic complications: no     Last Vitals:  Vitals:   08/12/18 1710 08/12/18 1729  BP: (!) 156/66 137/66  Pulse:  60  Resp:  18  Temp:  (!) 36.2 C  SpO2:  95%    Last Pain:  Vitals:   08/12/18 1729  TempSrc: Temporal  PainSc: 0-No pain                 Precious Haws Deshon Koslowski

## 2018-08-22 ENCOUNTER — Encounter: Payer: Self-pay | Admitting: Oncology

## 2018-08-22 ENCOUNTER — Inpatient Hospital Stay (HOSPITAL_BASED_OUTPATIENT_CLINIC_OR_DEPARTMENT_OTHER): Payer: Medicare Other | Admitting: Oncology

## 2018-08-22 ENCOUNTER — Ambulatory Visit: Payer: Self-pay | Admitting: Oncology

## 2018-08-22 ENCOUNTER — Other Ambulatory Visit: Payer: Self-pay

## 2018-08-22 ENCOUNTER — Inpatient Hospital Stay: Payer: Medicare Other | Attending: Oncology

## 2018-08-22 VITALS — BP 152/90 | HR 62 | Temp 96.7°F | Resp 18 | Wt 157.0 lb

## 2018-08-22 DIAGNOSIS — C9110 Chronic lymphocytic leukemia of B-cell type not having achieved remission: Secondary | ICD-10-CM | POA: Diagnosis present

## 2018-08-22 DIAGNOSIS — F17210 Nicotine dependence, cigarettes, uncomplicated: Secondary | ICD-10-CM | POA: Insufficient documentation

## 2018-08-22 DIAGNOSIS — Z85828 Personal history of other malignant neoplasm of skin: Secondary | ICD-10-CM

## 2018-08-22 LAB — CBC WITH DIFFERENTIAL/PLATELET
Abs Immature Granulocytes: 0.02 10*3/uL (ref 0.00–0.07)
Basophils Absolute: 0 10*3/uL (ref 0.0–0.1)
Basophils Relative: 0 %
EOS ABS: 0.2 10*3/uL (ref 0.0–0.5)
EOS PCT: 1 %
HCT: 38.1 % (ref 36.0–46.0)
HEMOGLOBIN: 12.2 g/dL (ref 12.0–15.0)
Immature Granulocytes: 0 %
Lymphocytes Relative: 71 %
Lymphs Abs: 8.1 10*3/uL — ABNORMAL HIGH (ref 0.7–4.0)
MCH: 30 pg (ref 26.0–34.0)
MCHC: 32 g/dL (ref 30.0–36.0)
MCV: 93.6 fL (ref 80.0–100.0)
Monocytes Absolute: 0.8 10*3/uL (ref 0.1–1.0)
Monocytes Relative: 7 %
Neutro Abs: 2.5 10*3/uL (ref 1.7–7.7)
Neutrophils Relative %: 21 %
Platelets: 255 10*3/uL (ref 150–400)
RBC: 4.07 MIL/uL (ref 3.87–5.11)
RDW: 12.6 % (ref 11.5–15.5)
WBC: 11.5 10*3/uL — ABNORMAL HIGH (ref 4.0–10.5)
nRBC: 0 % (ref 0.0–0.2)

## 2018-08-22 LAB — COMPREHENSIVE METABOLIC PANEL
ALT: 11 U/L (ref 0–44)
AST: 14 U/L — ABNORMAL LOW (ref 15–41)
Albumin: 3.9 g/dL (ref 3.5–5.0)
Alkaline Phosphatase: 86 U/L (ref 38–126)
Anion gap: 7 (ref 5–15)
BILIRUBIN TOTAL: 0.6 mg/dL (ref 0.3–1.2)
BUN: 14 mg/dL (ref 8–23)
CALCIUM: 9.9 mg/dL (ref 8.9–10.3)
CO2: 30 mmol/L (ref 22–32)
CREATININE: 1.01 mg/dL — AB (ref 0.44–1.00)
Chloride: 101 mmol/L (ref 98–111)
GFR calc Af Amer: >60 mL/min (ref >60)
GFR calc non Af Amer: 55 mL/min — ABNORMAL LOW (ref >60)
Glucose, Bld: 95 mg/dL (ref 70–99)
Potassium: 3.9 mmol/L (ref 3.5–5.1)
Sodium: 138 mmol/L (ref 135–145)
Total Protein: 7.2 g/dL (ref 6.5–8.1)

## 2018-08-22 NOTE — Progress Notes (Signed)
Patient here for follow up. Pt had a fall on December 28 and broke right arm.

## 2018-08-23 NOTE — Progress Notes (Signed)
Hematology/Oncology Follow up  note [facility] [phone] [phone]   Patient Care Team: Perrin Maltese, MD as PCP - General (Internal Medicine)  REFERRING PROVIDER:  Perrin Maltese, MD  REASON FOR VISIT Follow up for treatment of CLL  HISTORY OF PRESENTING ILLNESS:  Karen [name] is a  73 y.o.  female with PMH listed below who was referred to me for evaluation of persistent leukocytosis/lymphocytosis. Reviewed patient's lab work done at PCPs office. 05/01/2017 WBC 11.7 absolute lymphocytes 7.4. Hemoglobin 13 MCV 95 platelet counts 225,000. 06/22/2017 WBC is 17.5, absolute lymphocyte 11.7 hemoglobin 11.9, MCV 92, platelet count 240,000. Patient reports feeling tired at baseline. Otherwise denies any night sweats, fever or chills. She reports she got sick around holiday. But today she feels better. She is active smoker, currently 1 or 2 cigarettes a day  INTERVAL HISTORY Karen [name] is a 73 y.o. female who has above history reviewed by me today present for follow-up visit for CLL Reports feeling pretty well today.  Denies any weight loss, night sweats, fever or chills.  Chronic fatigue stable.  Continues to smoke daily.   Review of Systems  Constitutional: Positive for malaise/fatigue. Negative for chills, fever and weight loss.  HENT: Negative for hearing loss, nosebleeds and sore throat.   Eyes: Negative for blurred vision, double vision, photophobia and redness.  Respiratory: Negative for cough, hemoptysis, sputum production, shortness of breath and wheezing.   Cardiovascular: Negative for chest pain, palpitations, orthopnea and leg swelling.  Gastrointestinal: Negative for abdominal pain, blood in stool, heartburn, nausea and vomiting.  Genitourinary: Negative for dysuria and frequency.  Musculoskeletal: Negative for back pain, myalgias and neck pain.  Skin: Negative for itching and rash.  Neurological: Negative for dizziness,  tingling, tremors and weakness.  Endo/Heme/Allergies: Negative for environmental allergies. Does not bruise/bleed easily.  Psychiatric/Behavioral: Negative for depression and substance abuse.    MEDICAL HISTORY:  Past Medical History:  Diagnosis Date  . Anxiety   . Artery occlusion   . Asthma   . Back pain   . Bronchitis   . Cancer ([place])    Skin Cancer  . CLL (chronic lymphocytic leukemia) ([place])   . Coronary artery disease   . Depression   . Dyspnea   . GERD (gastroesophageal reflux disease)   . History of kidney stones   . Hypercholesteremia   . Hyperlipemia   . Hypertension   . Hypothyroidism   . Myocardial infarction ([place]) 1999  . Neuropathy   . Restless leg syndrome   . Sleep apnea    use C-PAP  . Wears dentures    full upper, partial lower.  doesn't currently wear    SURGICAL HISTORY: Past Surgical History:  Procedure Laterality Date  . COLONOSCOPY WITH PROPOFOL N/A 11/20/2016   Procedure: COLONOSCOPY WITH PROPOFOL;  Surgeon: Lucilla Lame, MD;  Location: [place];  Service: Endoscopy;  Laterality: N/A;  sleep apnea  . CORONARY ANGIOPLASTY WITH STENT PLACEMENT  2008  . DILATION AND CURETTAGE OF UTERUS    . EYE SURGERY Bilateral    Cataract Extraction with IOL  . HAND SURGERY Left 12/2017   upper hand fracture repair  . LEFT HEART CATH AND CORONARY ANGIOGRAPHY N/A 06/29/2017   Procedure: LEFT HEART CATH AND CORONARY ANGIOGRAPHY;  Surgeon: Dionisio David, MD;  Location: [facility];  Service: Cardiovascular;  Laterality: N/A;  . OPEN REDUCTION INTERNAL FIXATION (ORIF) DISTAL RADIAL FRACTURE Right 08/12/2018   Procedure: OPEN REDUCTION INTERNAL  FIXATION (ORIF) DISTAL RADIAL FRACTURE;  Surgeon: [name], MD;  Location: [facility];  Service: Orthopedics;  Laterality: Right;  . POLYPECTOMY  11/20/2016   Procedure: POLYPECTOMY;  Surgeon: Lucilla Lame, MD;  Location: [place];  Service: Endoscopy;;  . TUBAL LIGATION    . VAGINAL  HYSTERECTOMY    . VULVECTOMY Right 01/31/2016   Procedure: WIDE EXCISION VULVECTOMY-RIGHT LABIA;  Surgeon: Brayton Mars, MD;  Location: [facility];  Service: Gynecology;  Laterality: Right;    SOCIAL HISTORY: Social History   Socioeconomic History  . Marital status: Single    Spouse name: Not on file  . Number of children: Not on file  . Years of education: Not on file  . Highest education level: Not on file  Occupational History  . Occupation: part time job  Social Needs  . Financial resource strain: Not on file  . Food insecurity:    Worry: Not on file    Inability: Not on file  . Transportation needs:    Medical: Not on file    Non-medical: Not on file  Tobacco Use  . Smoking status: Current Every Day Smoker    Packs/day: 0.50    Years: 40.00    Pack years: 20.00    Types: Cigarettes  . Smokeless tobacco: Current User    Types: Snuff  Substance and Sexual Activity  . Alcohol use: No    Alcohol/week: 0.0 standard drinks  . Drug use: No  . Sexual activity: Not Currently    Partners: Male    Birth control/protection: Surgical  Lifestyle  . Physical activity:    Days per week: 0 days    Minutes per session: 0 min  . Stress: Not on file  Relationships  . Social connections:    Talks on phone: Not on file    Gets together: Not on file    Attends religious service: Not on file    Active member of club or organization: Not on file    Attends meetings of clubs or organizations: Not on file    Relationship status: Not on file  . Intimate partner violence:    Fear of current or ex partner: Not on file    Emotionally abused: Not on file    Physically abused: Not on file    Forced sexual activity: Not on file  Other Topics Concern  . Not on file  Social History Narrative  . Not on file    FAMILY HISTORY: Family History  Problem Relation Age of Onset  . Heart disease Father   . Stroke Mother   . Hypertension Mother   . Lung cancer Brother   . Heart  attack Brother   . Congestive Heart Failure Sister   . Cervical cancer Sister   . Cancer Neg Hx   . Diabetes Neg Hx   . Breast cancer Neg Hx     ALLERGIES:  is allergic to requip [ropinirole hcl]; celebrex [celecoxib]; meloxicam; and nitrofurantoin.  MEDICATIONS:  Current Outpatient Medications  Medication Sig Dispense Refill  . albuterol (PROVENTIL HFA;VENTOLIN HFA) 108 (90 BASE) MCG/ACT inhaler Inhale 2 puffs into the lungs every 4 (four) hours as needed for wheezing or shortness of breath. 1 Inhaler 0  . albuterol (PROVENTIL) (2.5 MG/3ML) 0.083% nebulizer solution Take 3 mLs (2.5 mg total) by nebulization every 4 (four) hours as needed for wheezing or shortness of breath. 75 mL 0  . alendronate (FOSAMAX) 70 MG tablet alendronate 70 mg tablet  TAKE 1  TABLET BY MOUTH ONCE A WEEK    . amLODipine (NORVASC) 10 MG tablet Take 10 mg by mouth daily.    Marland Kitchen aspirin EC 81 MG tablet Take 81 mg by mouth daily.    . brompheniramine-pseudoephedrine-DM 30-2-10 MG/5ML syrup brompheniramine-pseudoephedrine-DM 2 mg-30 mg-10 mg/5 mL oral syrup  TAKE 5 MLS( 1 TEASPOONFUL) BY MOUTH 4 TIMES A DAY AS NEEDED    . Calcium Carb-Cholecalciferol 600-800 MG-UNIT TABS calcium carbonate-vitamin D3 600 mg (1,500 mg)-800 unit tablet  TAKE 1 TABLET BY MOUTH TWO TIMES A DAY    . chlorthalidone (HYGROTON) 25 MG tablet Take 25 mg by mouth daily.    . clopidogrel (PLAVIX) 75 MG tablet Take 75 mg by mouth daily.     Marland Kitchen dexlansoprazole (DEXILANT) 60 MG capsule Take 60 mg by mouth daily.     . DULoxetine (CYMBALTA) 60 MG capsule Take 60 mg by mouth daily.    . furosemide (LASIX) 20 MG tablet Take 20 mg by mouth daily.  3  . gabapentin (NEURONTIN) 400 MG capsule Take 400 mg by mouth 2 (two) times daily.     . hydrOXYzine (VISTARIL) 50 MG capsule hydroxyzine pamoate 50 mg capsule  TAKE 1 TO 2 CAPSULES BY MOUTH EVERY NIGHT AT BEDTIME    . isosorbide dinitrate (ISORDIL) 30 MG tablet Take 30 mg by mouth daily.    Marland Kitchen levothyroxine  (SYNTHROID, LEVOTHROID) 25 MCG tablet levothyroxine 25 mcg tablet  TAKE 1 TABLET BY MOUTH EVERY MORNING    . lisinopril (PRINIVIL,ZESTRIL) 40 MG tablet Take 40 mg by mouth daily.    . metoprolol succinate (TOPROL-XL) 50 MG 24 hr tablet Take 25 mg by mouth daily. Take one half tablet with or immediately following a meal.    . Nebulizers (COMPRESSOR/NEBULIZER) MISC 1 Units by Does not apply route every 4 (four) hours as needed. 1 each 0  . nitroGLYCERIN (NITROSTAT) 0.4 MG SL tablet Place 0.4 mg under the tongue every 5 (five) minutes as needed for chest pain.     Marland Kitchen oxybutynin (DITROPAN-XL) 5 MG 24 hr tablet Take 5 mg by mouth daily.    . potassium chloride (K-DUR) 10 MEQ tablet Take 10 mEq by mouth daily.  3  . potassium chloride (K-DUR,KLOR-CON) 10 MEQ tablet Take 10 mEq by mouth daily.    Marland Kitchen PREMARIN vaginal cream PLACE 6.22 (2/9NL) APPLICATORFUL VAGINALLY 2 (TWO) TIMES A WEEK. 30 g 12  . ranolazine (RANEXA) 1000 MG SR tablet Ranexa 1,000 mg tablet,extended release  TAKE 1 TABLET BY MOUTH TWICE A DAY    . simvastatin (ZOCOR) 40 MG tablet Take 40 mg by mouth daily at 6 PM.     . traMADol (ULTRAM) 50 MG tablet Take 1 tablet (50 mg total) by mouth every 6 (six) hours as needed. (Patient taking differently: Take 50 mg by mouth daily as needed for moderate pain. ) 12 tablet 0  . traZODone (DESYREL) 100 MG tablet Take 100 mg by mouth at bedtime as needed. for sleep  0  . triamterene-hydrochlorothiazide (MAXZIDE-25) 37.5-25 MG tablet Take by mouth.    . ALPRAZolam (XANAX) 1 MG tablet alprazolam 1 mg tablet  TAKE 1 TABLET BY MOUTH 3 TIMES A DAY AS NEEDED    . chlorpheniramine-HYDROcodone (TUSSIONEX PENNKINETIC ER) 10-8 MG/5ML SUER Take 5 mLs by mouth 2 (two) times daily. (Patient not taking: Reported on 08/12/2018) 70 mL 0  . HYDROcodone-acetaminophen (NORCO) 7.5-325 MG tablet Take 1-2 tablets by mouth every 6 (six) hours as needed for moderate  pain. (Patient not taking: Reported on 08/22/2018) 30 tablet 0    . HYDROcodone-acetaminophen (NORCO/VICODIN) 5-325 MG tablet Take 1 tablet by mouth every 8 (eight) hours as needed for moderate pain. (Patient not taking: Reported on 08/22/2018) 15 tablet 0  . Influenza vac split quadrivalent PF (FLUZONE HIGH-DOSE) 0.5 ML injection Fluzone High-Dose 2016-2017 (PF) 180 mcg/0.5 mL intramuscular syringe  inject 0.5 milliliter intramuscularly    . pneumococcal 23 valent vaccine (PNEUMOVAX 23) 25 MCG/0.5ML injection Pneumovax 23 25 mcg/0.5 mL injection syringe  inject 0.5 milliliter intramuscularly    . predniSONE (DELTASONE) 20 MG tablet prednisone 20 mg tablet  TAKE 1 TABLET BY MOUTH ONCE A DAY    . rosuvastatin (CRESTOR) 40 MG tablet Crestor 40 mg tablet  TAKE 1 TABLET BY MOUTH AT BEDTIME INSTEAD OF SIMVASTATIN    . Zoster Vaccine Live (ZOSTAVAX [place]) Zostavax (PF)     No current facility-administered medications for this visit.      PHYSICAL EXAMINATION: ECOG PERFORMANCE STATUS: 1 - Symptomatic but completely ambulatory Vitals:   08/22/18 1026  BP: (!) 152/90  Pulse: 62  Resp: 18  Temp: (!) 96.7 F (35.9 C)   Filed Weights   08/22/18 1026  Weight: 157 lb (71.2 kg)    Physical Exam  Constitutional: She is oriented to person, place, and time and well-developed, well-nourished, and in no distress. No distress.  HENT:  Head: Normocephalic and atraumatic.  Nose: Nose normal.  Mouth/Throat: Oropharynx is clear and moist. No oropharyngeal exudate.  Eyes: Pupils are equal, round, and reactive to light. Conjunctivae and EOM are normal. Left eye exhibits no discharge. No scleral icterus.  Neck: Normal range of motion. Neck supple. No JVD present.  Cardiovascular: Normal rate and regular rhythm.  No murmur heard. Pulmonary/Chest: Effort normal. No respiratory distress. She has no rales. She exhibits no tenderness.  Decreased breath sound bilaterally  Abdominal: Soft. Bowel sounds are normal. She exhibits no distension and no mass. There is no abdominal  tenderness. There is no rebound and no guarding.  Musculoskeletal: Normal range of motion.        General: No edema.  Lymphadenopathy:    She has no cervical adenopathy.  Neurological: She is alert and oriented to person, place, and time. No cranial nerve deficit. She exhibits normal muscle tone. Coordination normal.  Skin: Skin is warm and dry. She is not diaphoretic. No erythema.  Psychiatric: Affect and judgment normal.     LABORATORY DATA:  I have reviewed the data as listed Lab Results  Component Value Date   WBC 11.5 (H) 08/22/2018   HGB 12.2 08/22/2018   HCT 38.1 08/22/2018   MCV 93.6 08/22/2018   PLT 255 08/22/2018   Recent Labs    02/15/18 1019 05/09/18 0030 08/12/18 1038 08/12/18 1042 08/22/18 0952  NA 137 138 138 140 138  K 3.6 3.4* 4.0 4.1 3.9  CL 102 103 104  --  101  CO2 25 29 29   --  30  GLUCOSE 121* 132* 104* 99 95  BUN 12 9 12   --  14  CREATININE 1.11* 1.19* 1.01*  --  1.01*  CALCIUM 9.5 9.7 10.1  --  9.9  GFRNONAA 48* 44* 55*  --  55*  GFRAA 56* 51* >60  --  >60  PROT 6.9 7.1  --   --  7.2  ALBUMIN 3.9 4.2  --   --  3.9  AST 22 20  --   --  14*  ALT 21  17  --   --  11  ALKPHOS 73 76  --   --  86  BILITOT 0.5 0.3  --   --  0.6    Serum Osmolarity 266, urine Osm 374, urine sodium 21.    ASSESSMENT & PLAN:  1. CLL (chronic lymphocytic leukemia) ([place])    #Labs reviewed and discussed with patient.  Stable leukocytosis, no clinical signs of constitutional symptoms. Asymptomatic.  Recommend continuing watchful waiting.  All questions were answered. The patient knows to call the clinic with any problems questions or concerns. Orders Placed This Encounter  Procedures  . Lactate dehydrogenase    Standing Status:   Future    Standing Expiration Date:   08/23/2019  . CBC with Differential/Platelet    Standing Status:   Future    Standing Expiration Date:   08/23/2019  . Comprehensive metabolic panel    Standing Status:   Future    Standing  Expiration Date:   08/23/2019    Return of visit  6 months We spent sufficient time to discuss many aspect of care, questions were answered to patient's satisfaction. Total face to face encounter time for this patient visit was 15 min. >50% of the time was  spent in counseling and coordination of care.   Earlie Server, MD, PhD Hematology Oncology [facility] Pager- [phone] 08/23/2018

## 2018-11-23 ENCOUNTER — Emergency Department: Payer: Medicare Other

## 2018-11-23 ENCOUNTER — Encounter: Payer: Self-pay | Admitting: Emergency Medicine

## 2018-11-23 ENCOUNTER — Other Ambulatory Visit: Payer: Self-pay

## 2018-11-23 ENCOUNTER — Emergency Department
Admission: EM | Admit: 2018-11-23 | Discharge: 2018-11-23 | Disposition: A | Discharge status: Home / Self Care | Payer: Medicare Other | Attending: Emergency Medicine | Admitting: Emergency Medicine

## 2018-11-23 DIAGNOSIS — Z7982 Long term (current) use of aspirin: Secondary | ICD-10-CM | POA: Insufficient documentation

## 2018-11-23 DIAGNOSIS — F17220 Nicotine dependence, chewing tobacco, uncomplicated: Secondary | ICD-10-CM | POA: Insufficient documentation

## 2018-11-23 DIAGNOSIS — R002 Palpitations: Secondary | ICD-10-CM

## 2018-11-23 DIAGNOSIS — E039 Hypothyroidism, unspecified: Secondary | ICD-10-CM | POA: Insufficient documentation

## 2018-11-23 DIAGNOSIS — F17210 Nicotine dependence, cigarettes, uncomplicated: Secondary | ICD-10-CM | POA: Insufficient documentation

## 2018-11-23 DIAGNOSIS — R079 Chest pain, unspecified: Secondary | ICD-10-CM

## 2018-11-23 DIAGNOSIS — I10 Essential (primary) hypertension: Secondary | ICD-10-CM | POA: Diagnosis not present

## 2018-11-23 DIAGNOSIS — I4891 Unspecified atrial fibrillation: Secondary | ICD-10-CM | POA: Insufficient documentation

## 2018-11-23 DIAGNOSIS — Z79899 Other long term (current) drug therapy: Secondary | ICD-10-CM | POA: Insufficient documentation

## 2018-11-23 DIAGNOSIS — I2510 Atherosclerotic heart disease of native coronary artery without angina pectoris: Secondary | ICD-10-CM | POA: Insufficient documentation

## 2018-11-23 DIAGNOSIS — J45909 Unspecified asthma, uncomplicated: Secondary | ICD-10-CM | POA: Diagnosis not present

## 2018-11-23 DIAGNOSIS — I252 Old myocardial infarction: Secondary | ICD-10-CM | POA: Diagnosis not present

## 2018-11-23 LAB — CBC
HCT: 38.6 % (ref 36.0–46.0)
Hemoglobin: 12.2 g/dL (ref 12.0–15.0)
MCH: 30.3 pg (ref 26.0–34.0)
MCHC: 31.6 g/dL (ref 30.0–36.0)
MCV: 96 fL (ref 80.0–100.0)
Platelets: 187 10*3/uL (ref 150–400)
RBC: 4.02 MIL/uL (ref 3.87–5.11)
RDW: 13.4 % (ref 11.5–15.5)
WBC: 12.5 10*3/uL — ABNORMAL HIGH (ref 4.0–10.5)
nRBC: 0 % (ref 0.0–0.2)

## 2018-11-23 LAB — URINALYSIS, COMPLETE (UACMP) WITH MICROSCOPIC
Bacteria, UA: NONE SEEN
Bilirubin Urine: NEGATIVE
Glucose, UA: NEGATIVE mg/dL
Hgb urine dipstick: NEGATIVE
Ketones, ur: NEGATIVE mg/dL
Leukocytes,Ua: NEGATIVE
Nitrite: NEGATIVE
Protein, ur: NEGATIVE mg/dL
Specific Gravity, Urine: 1.003 — ABNORMAL LOW (ref 1.005–1.030)
Squamous Epithelial / HPF: NONE SEEN (ref 0–5)
pH: 7 (ref 5.0–8.0)

## 2018-11-23 LAB — BASIC METABOLIC PANEL
Anion gap: 7 (ref 5–15)
BUN: 12 mg/dL (ref 8–23)
CO2: 28 mmol/L (ref 22–32)
Calcium: 10 mg/dL (ref 8.9–10.3)
Chloride: 104 mmol/L (ref 98–111)
Creatinine, Ser: 1.26 mg/dL — ABNORMAL HIGH (ref 0.44–1.00)
GFR calc Af Amer: 49 mL/min — ABNORMAL LOW (ref >60)
GFR calc non Af Amer: 42 mL/min — ABNORMAL LOW (ref >60)
Glucose, Bld: 108 mg/dL — ABNORMAL HIGH (ref 70–99)
Potassium: 3.5 mmol/L (ref 3.5–5.1)
Sodium: 139 mmol/L (ref 135–145)

## 2018-11-23 LAB — TSH: TSH: 2.729 u[IU]/mL (ref 0.350–4.500)

## 2018-11-23 LAB — T4, FREE: Free T4: 0.61 ng/dL — ABNORMAL LOW (ref 0.82–1.77)

## 2018-11-23 LAB — TROPONIN I: Troponin I: <0.03 ng/mL (ref <0.03)

## 2018-11-23 NOTE — ED Provider Notes (Signed)
[facility] Emergency Department Provider Note   ____________________________________________   First MD Initiated Contact with Patient 11/23/18 0400     (approximate)  I have reviewed the triage vital signs and the nursing notes.   HISTORY  Chief Complaint Atrial Fibrillation    HPI Karen [name] is a 73 y.o. female brought to the ED via EMS from home with a chief complaint of palpitations.  Patient reports feeling palpitations with central chest tightness around 12:30 AM while going to bed.  EMS reports monitor demonstrating atrial fibrillation at a rate of 130s.  Patient converted spontaneously immediately prior to arrival to the emergency department.  Denies history of A. fib.  Sees cardiologist Dr. Chancy Milroy for CAD.  Takes aspirin and Plavix daily.  Drinks 3 cups of coffee and a [employer] daily.  Denies energy drinks.  Denies recent fever, cough, shortness of breath, abdominal pain, nausea or vomiting.  Denies recent travel, trauma or exposure to persons diagnosed with coronavirus.        Past Medical History:  Diagnosis Date  . Anxiety   . Artery occlusion   . Asthma   . Back pain   . Bronchitis   . Cancer ([place])    Skin Cancer  . CLL (chronic lymphocytic leukemia) ([place])   . Coronary artery disease   . Depression   . Dyspnea   . GERD (gastroesophageal reflux disease)   . History of kidney stones   . Hypercholesteremia   . Hyperlipemia   . Hypertension   . Hypothyroidism   . Myocardial infarction ([place]) 1999  . Neuropathy   . Restless leg syndrome   . Sleep apnea    use C-PAP  . Wears dentures    full upper, partial lower.  doesn't currently wear    Patient Active Problem List   Diagnosis Date Noted  . CLL (chronic lymphocytic leukemia) ([place]) 12/18/2017  . Unstable angina ([place]) 06/22/2017  . Inclusion cyst of vulva 12/14/2016  . Lumbar spondylosis 08/14/2016  . Trochanteric bursitis of right hip 08/14/2016  . Herpes simplex virus  (HSV) infection 12/09/2015  . History of total vaginal hysterectomy (TVH) 11/25/2015  . Vaginal odor 11/25/2015  . History of UTI 11/25/2015  . Vaginal atrophy 11/25/2015  . Dyspareunia in female 11/25/2015  . Tendinitis of wrist 05/26/2015    Past Surgical History:  Procedure Laterality Date  . COLONOSCOPY WITH PROPOFOL N/A 11/20/2016   Procedure: COLONOSCOPY WITH PROPOFOL;  Surgeon: Lucilla Lame, MD;  Location: [facility];  Service: Endoscopy;  Laterality: N/A;  sleep apnea  . CORONARY ANGIOPLASTY WITH STENT PLACEMENT  2008  . DILATION AND CURETTAGE OF UTERUS    . EYE SURGERY Bilateral    Cataract Extraction with IOL  . HAND SURGERY Left 12/2017   upper hand fracture repair  . LEFT HEART CATH AND CORONARY ANGIOGRAPHY N/A 06/29/2017   Procedure: LEFT HEART CATH AND CORONARY ANGIOGRAPHY;  Surgeon: Dionisio David, MD;  Location: [name];  Service: Cardiovascular;  Laterality: N/A;  . OPEN REDUCTION INTERNAL FIXATION (ORIF) DISTAL RADIAL FRACTURE Right 08/12/2018   Procedure: OPEN REDUCTION INTERNAL FIXATION (ORIF) DISTAL RADIAL FRACTURE;  Surgeon: [name], MD;  Location: [facility];  Service: Orthopedics;  Laterality: Right;  . POLYPECTOMY  11/20/2016   Procedure: POLYPECTOMY;  Surgeon: Lucilla Lame, MD;  Location: [facility];  Service: Endoscopy;;  . TUBAL LIGATION    . VAGINAL HYSTERECTOMY    . VULVECTOMY Right 01/31/2016   Procedure:  WIDE EXCISION VULVECTOMY-RIGHT LABIA;  Surgeon: Brayton Mars, MD;  Location: [facility];  Service: Gynecology;  Laterality: Right;    Prior to Admission medications   Medication Sig Start Date End Date Taking? Authorizing Provider  albuterol (PROVENTIL HFA;VENTOLIN HFA) 108 (90 BASE) MCG/ACT inhaler Inhale 2 puffs into the lungs every 4 (four) hours as needed for wheezing or shortness of breath. 07/06/15   Cuthriell, Charline Bills, PA-C  albuterol (PROVENTIL) (2.5 MG/3ML) 0.083% nebulizer solution Take 3 mLs (2.5 mg  total) by nebulization every 4 (four) hours as needed for wheezing or shortness of breath. 05/09/18   Paulette Blanch, MD  alendronate (FOSAMAX) 70 MG tablet alendronate 70 mg tablet  TAKE 1 TABLET BY MOUTH ONCE A WEEK    [provider]  ALPRAZolam (XANAX) 1 MG tablet alprazolam 1 mg tablet  TAKE 1 TABLET BY MOUTH 3 TIMES A DAY AS NEEDED    [provider]  amLODipine (NORVASC) 10 MG tablet Take 10 mg by mouth daily.    [provider]  aspirin EC 81 MG tablet Take 81 mg by mouth daily.    [provider]  brompheniramine-pseudoephedrine-DM 30-2-10 MG/5ML syrup brompheniramine-pseudoephedrine-DM 2 mg-30 mg-10 mg/5 mL oral syrup  TAKE 5 MLS( 1 TEASPOONFUL) BY MOUTH 4 TIMES A DAY AS NEEDED    [provider]  Calcium Carb-Cholecalciferol 600-800 MG-UNIT TABS calcium carbonate-vitamin D3 600 mg (1,500 mg)-800 unit tablet  TAKE 1 TABLET BY MOUTH TWO TIMES A DAY    [provider]  chlorpheniramine-HYDROcodone (TUSSIONEX PENNKINETIC ER) 10-8 MG/5ML SUER Take 5 mLs by mouth 2 (two) times daily. Patient not taking: Reported on 08/12/2018 05/09/18   Paulette Blanch, MD  chlorthalidone (HYGROTON) 25 MG tablet Take 25 mg by mouth daily.    [provider]  clopidogrel (PLAVIX) 75 MG tablet Take 75 mg by mouth daily.     [provider]  dexlansoprazole (DEXILANT) 60 MG capsule Take 60 mg by mouth daily.     [provider]  DULoxetine (CYMBALTA) 60 MG capsule Take 60 mg by mouth daily.    [provider]  furosemide (LASIX) 20 MG tablet Take 20 mg by mouth daily. 12/20/17   [provider]  gabapentin (NEURONTIN) 400 MG capsule Take 400 mg by mouth 2 (two) times daily.     [provider]  HYDROcodone-acetaminophen (NORCO) 7.5-325 MG tablet Take 1-2 tablets by mouth every 6 (six) hours as needed for moderate pain. Patient not taking: Reported on 08/22/2018 08/12/18   [name], MD   HYDROcodone-acetaminophen (NORCO/VICODIN) 5-325 MG tablet Take 1 tablet by mouth every 8 (eight) hours as needed for moderate pain. Patient not taking: Reported on 08/22/2018 07/20/18 07/20/19  Jearld Fenton, NP  hydrOXYzine (VISTARIL) 50 MG capsule hydroxyzine pamoate 50 mg capsule  TAKE 1 TO 2 CAPSULES BY MOUTH EVERY NIGHT AT BEDTIME    [provider]  Influenza vac split quadrivalent PF (FLUZONE HIGH-DOSE) 0.5 ML injection Fluzone High-Dose 2016-2017 (PF) 180 mcg/0.5 mL intramuscular syringe  inject 0.5 milliliter intramuscularly    [provider]  isosorbide dinitrate (ISORDIL) 30 MG tablet Take 30 mg by mouth daily.    [provider]  levothyroxine (SYNTHROID, LEVOTHROID) 25 MCG tablet levothyroxine 25 mcg tablet  TAKE 1 TABLET BY MOUTH EVERY MORNING    [provider]  lisinopril (PRINIVIL,ZESTRIL) 40 MG tablet Take 40 mg by mouth daily.    [provider]  metoprolol succinate (TOPROL-XL) 50 MG  24 hr tablet Take 25 mg by mouth daily. Take one half tablet with or immediately following a meal.    [provider]  Nebulizers (COMPRESSOR/NEBULIZER) MISC 1 Units by Does not apply route every 4 (four) hours as needed. 05/09/18   Paulette Blanch, MD  nitroGLYCERIN (NITROSTAT) 0.4 MG SL tablet Place 0.4 mg under the tongue every 5 (five) minutes as needed for chest pain.     [provider]  oxybutynin (DITROPAN-XL) 5 MG 24 hr tablet Take 5 mg by mouth daily. 06/14/18   [provider]  pneumococcal 23 valent vaccine (PNEUMOVAX 23) 25 MCG/0.5ML injection Pneumovax 23 25 mcg/0.5 mL injection syringe  inject 0.5 milliliter intramuscularly    [provider]  potassium chloride (K-DUR) 10 MEQ tablet Take 10 mEq by mouth daily. 05/28/18   [provider]  potassium chloride (K-DUR,KLOR-CON) 10 MEQ tablet Take 10 mEq by mouth daily.    [provider]  predniSONE (DELTASONE) 20 MG tablet prednisone 20 mg  tablet  TAKE 1 TABLET BY MOUTH ONCE A DAY    [provider]  PREMARIN vaginal cream PLACE 5.39 (7/6BH) APPLICATORFUL VAGINALLY 2 (TWO) TIMES A WEEK. 04/17/18   Defrancesco, Alanda Slim, MD  ranolazine (RANEXA) 1000 MG SR tablet Ranexa 1,000 mg tablet,extended release  TAKE 1 TABLET BY MOUTH TWICE A DAY    [provider]  rosuvastatin (CRESTOR) 40 MG tablet Crestor 40 mg tablet  TAKE 1 TABLET BY MOUTH AT BEDTIME INSTEAD OF SIMVASTATIN    [provider]  simvastatin (ZOCOR) 40 MG tablet Take 40 mg by mouth daily at 6 PM.     [provider]  traMADol (ULTRAM) 50 MG tablet Take 1 tablet (50 mg total) by mouth every 6 (six) hours as needed. Patient taking differently: Take 50 mg by mouth daily as needed for moderate pain.  12/01/14   Ruffian, III Luanna Cole, PA-C  traZODone (DESYREL) 100 MG tablet Take 100 mg by mouth at bedtime as needed. for sleep 05/16/18   [provider]  triamterene-hydrochlorothiazide (MAXZIDE-25) 37.5-25 MG tablet Take by mouth. 11/04/02   [provider]  Zoster Vaccine Live (ZOSTAVAX [place]) Zostavax (PF)    [provider]    Allergies Requip [ropinirole hcl]; Celebrex [celecoxib]; Meloxicam; and Nitrofurantoin  Family History  Problem Relation Age of Onset  . Heart disease Father   . Stroke Mother   . Hypertension Mother   . Lung cancer Brother   . Heart attack Brother   . Congestive Heart Failure Sister   . Cervical cancer Sister   . Cancer Neg Hx   . Diabetes Neg Hx   . Breast cancer Neg Hx     Social History Social History   Tobacco Use  . Smoking status: Current Every Day Smoker    Packs/day: 0.50    Years: 40.00    Pack years: 20.00    Types: Cigarettes  . Smokeless tobacco: Current User    Types: Snuff  Substance Use Topics  . Alcohol use: No    Alcohol/week: 0.0 standard drinks  . Drug use: No    Review of Systems  Constitutional: No fever/chills Eyes: No visual changes. ENT: No  sore throat. Cardiovascular: Positive for palpitations.  Denies chest pain. Respiratory: Denies shortness of breath. Gastrointestinal: No abdominal pain.  No nausea, no vomiting.  No diarrhea.  No constipation. Genitourinary: Negative for dysuria. Musculoskeletal: Negative for back pain. Skin: Negative for rash. Neurological: Negative for headaches, focal  weakness or numbness.   ____________________________________________   PHYSICAL EXAM:  VITAL SIGNS: ED Triage Vitals  Enc Vitals Group     BP 11/23/18 0357 (!) 170/84     Pulse Rate 11/23/18 0357 64     Resp 11/23/18 0357 18     Temp 11/23/18 0357 98.9 F (37.2 C)     Temp Source 11/23/18 0357 Oral     SpO2 11/23/18 0357 96 %     Weight 11/23/18 0353 160 lb (72.6 kg)     Height 11/23/18 0353 5\' 2"  (1.575 m)     Head Circumference --      Peak Flow --      Pain Score 11/23/18 0353 0     Pain Loc --      Pain Edu? --      Excl. in [place]? --     Constitutional: Alert and oriented. Well appearing and in no acute distress. Eyes: Conjunctivae are normal. PERRL. EOMI. Head: Atraumatic. Nose: No congestion/rhinnorhea. Mouth/Throat: Mucous membranes are moist.  Oropharynx non-erythematous. Neck: No stridor.  No thyromegaly. Cardiovascular: Normal rate, regular rhythm. Grossly normal heart sounds.  Good peripheral circulation. Respiratory: Normal respiratory effort.  No retractions. Lungs CTAB. Gastrointestinal: Soft and nontender. No distention. No abdominal bruits. No CVA tenderness. Musculoskeletal: No lower extremity tenderness nor edema.  No joint effusions. Neurologic:  Normal speech and language. No gross focal neurologic deficits are appreciated. No gait instability. Skin:  Skin is warm, dry and intact. No rash noted. Psychiatric: Mood and affect are normal. Speech and behavior are normal.  ____________________________________________   LABS (all labs ordered are listed, but only abnormal results are displayed)  Labs  Reviewed  BASIC METABOLIC PANEL - Abnormal; Notable for the following components:      Result Value   Glucose, Bld 108 (*)    Creatinine, Ser 1.26 (*)    GFR calc non Af Amer 42 (*)    GFR calc Af Amer 49 (*)    All other components within normal limits  CBC - Abnormal; Notable for the following components:   WBC 12.5 (*)    All other components within normal limits  T4, FREE - Abnormal; Notable for the following components:   Free T4 0.61 (*)    All other components within normal limits  URINALYSIS, COMPLETE (UACMP) WITH MICROSCOPIC - Abnormal; Notable for the following components:   Color, Urine STRAW (*)    APPearance CLEAR (*)    Specific Gravity, Urine 1.003 (*)    All other components within normal limits  TROPONIN I  TSH   ____________________________________________  EKG  ED ECG REPORT I, Najiyah Paris J, the attending physician, personally viewed and interpreted this ECG.   Date: 11/23/2018  EKG Time: 0355  Rate: 64  Rhythm: normal EKG, normal sinus rhythm  Axis: Normal  Intervals:none  ST&T Change: Nonspecific  ____________________________________________  RADIOLOGY  ED MD interpretation: No acute cardiopulmonary process  Official radiology report(s): Dg Chest 1 View  Result Date: 11/23/2018 CLINICAL DATA:  Chest pain and atrial fibrillation. EXAM: CHEST  1 VIEW COMPARISON:  Chest x-ray dated June 07, 2018. FINDINGS: The heart size and mediastinal contours are within normal limits. Atherosclerotic calcification of the aortic arch. Normal pulmonary vascularity. Unchanged linear scarring at the lung bases. No focal consolidation, pleural effusion, or pneumothorax. No acute osseous abnormality. IMPRESSION: No active disease. Electronically Signed   By: Titus Dubin M.D.   On: 11/23/2018 05:16    ____________________________________________   PROCEDURES  Procedure(s) performed (including Critical Care):  Procedures  CRITICAL CARE Performed by:  Paulette Blanch   Total critical care time: 30 minutes  Critical care time was exclusive of separately billable procedures and treating other patients.  Critical care was necessary to treat or prevent imminent or life-threatening deterioration.  Critical care was time spent personally by me on the following activities: development of treatment plan with patient and/or surrogate as well as nursing, discussions with consultants, evaluation of patient's response to treatment, examination of patient, obtaining history from patient or surrogate, ordering and performing treatments and interventions, ordering and review of laboratory studies, ordering and review of radiographic studies, pulse oximetry and re-evaluation of patient's condition. ____________________________________________   INITIAL IMPRESSION / ASSESSMENT AND PLAN / ED COURSE  As part of my medical decision making, I reviewed the following data within the [place] notes reviewed and incorporated, Labs reviewed, EKG interpreted, Old chart reviewed, Radiograph reviewed and Notes from prior ED visits     Jaslyne Holster was evaluated in Emergency Department on 11/23/2018 for the symptoms described in the history of present illness. She was evaluated in the context of the global COVID-19 pandemic, which necessitated consideration that the patient might be at risk for infection with the SARS-CoV-2 virus that causes COVID-19. Institutional protocols and algorithms that pertain to the evaluation of patients at risk for COVID-19 are in a state of rapid change based on information released by regulatory bodies including the CDC and federal and state organizations. These policies and algorithms were followed during the patient's care in the ED.   73 year old female who presents with palpitations; new onset atrial fibrillation per EMS. Differential diagnosis includes, but is not limited to, ACS, aortic dissection, pulmonary  embolism, cardiac tamponade, pneumothorax, pneumonia, pericarditis, myocarditis, GI-related causes including esophagitis/gastritis, and musculoskeletal chest wall pain.    Currently no complaints of chest pain.  Will obtain cardiac work-up including thyroid function.  Clinical Course as of Nov 22 529  Sat Nov 23, 2018  0530 Patient remains in normal sinus rhythm.  Updated her on all test results.  Denies chest pain or shortness of breath.  She will follow-up with her cardiologist and 2 to 3 days time.  Strict return precautions given.  Patient verbalizes understanding and agrees with plan of care.   [JS]    Clinical Course User Index [JS] Paulette Blanch, MD     ____________________________________________   FINAL CLINICAL IMPRESSION(S) / ED DIAGNOSES  Final diagnoses:  Palpitations  Atrial fibrillation, unspecified [facility])     ED Discharge Orders    None       Note:  This document was prepared using Dragon voice recognition software and may include unintentional dictation errors.   Paulette Blanch, MD 11/23/18 [phone]

## 2018-11-23 NOTE — ED Triage Notes (Signed)
Pt arrives via [facility] with c/o afib which per EMS converted at the time of transport. Pt does not know if she has a hx of afib. Pt is c/o of no pain at this time and pt is in NAD.

## 2018-11-23 NOTE — Discharge Instructions (Addendum)
1.  Continue all medicines as directed by your doctor. 2.  Decrease caffeine intake as much as possible. 3.  Return to the ER for worsening symptoms, persistent vomiting, difficulty breathing or other concerns.

## 2018-11-28 ENCOUNTER — Ambulatory Visit: Payer: Self-pay | Admitting: Cardiovascular Disease

## 2018-11-28 ENCOUNTER — Other Ambulatory Visit: Payer: Self-pay | Admitting: Cardiovascular Disease

## 2018-11-29 ENCOUNTER — Other Ambulatory Visit: Payer: Self-pay

## 2018-11-29 ENCOUNTER — Ambulatory Visit
Admission: RE | Admit: 2018-11-29 | Discharge: 2018-11-29 | Disposition: A | Discharge status: Home / Self Care | Payer: Medicare Other | Source: Ambulatory Visit | Attending: Cardiovascular Disease | Admitting: Cardiovascular Disease

## 2018-11-29 DIAGNOSIS — Z1159 Encounter for screening for other viral diseases: Secondary | ICD-10-CM | POA: Diagnosis not present

## 2018-11-29 DIAGNOSIS — Z01812 Encounter for preprocedural laboratory examination: Secondary | ICD-10-CM | POA: Diagnosis not present

## 2018-11-30 LAB — NOVEL CORONAVIRUS, NAA (HOSP ORDER, SEND-OUT TO REF LAB; TAT 18-24 HRS): SARS-CoV-2, NAA: NOT DETECTED

## 2018-12-02 ENCOUNTER — Other Ambulatory Visit: Payer: Self-pay

## 2018-12-02 ENCOUNTER — Ambulatory Visit
Admission: RE | Admit: 2018-12-02 | Discharge: 2018-12-02 | Disposition: A | Discharge status: Home / Self Care | Payer: Medicare Other | Attending: Cardiovascular Disease | Admitting: Cardiovascular Disease

## 2018-12-02 ENCOUNTER — Encounter
Admission: RE | Disposition: A | Discharge status: Home / Self Care | Payer: Self-pay | Source: Home / Self Care | Attending: Cardiovascular Disease

## 2018-12-02 DIAGNOSIS — Z7982 Long term (current) use of aspirin: Secondary | ICD-10-CM | POA: Diagnosis not present

## 2018-12-02 DIAGNOSIS — Z8262 Family history of osteoporosis: Secondary | ICD-10-CM | POA: Diagnosis not present

## 2018-12-02 DIAGNOSIS — K219 Gastro-esophageal reflux disease without esophagitis: Secondary | ICD-10-CM | POA: Diagnosis not present

## 2018-12-02 DIAGNOSIS — Z8249 Family history of ischemic heart disease and other diseases of the circulatory system: Secondary | ICD-10-CM | POA: Diagnosis not present

## 2018-12-02 DIAGNOSIS — I4892 Unspecified atrial flutter: Secondary | ICD-10-CM | POA: Insufficient documentation

## 2018-12-02 DIAGNOSIS — F17210 Nicotine dependence, cigarettes, uncomplicated: Secondary | ICD-10-CM | POA: Insufficient documentation

## 2018-12-02 DIAGNOSIS — I119 Hypertensive heart disease without heart failure: Secondary | ICD-10-CM | POA: Diagnosis not present

## 2018-12-02 DIAGNOSIS — Z79899 Other long term (current) drug therapy: Secondary | ICD-10-CM | POA: Insufficient documentation

## 2018-12-02 DIAGNOSIS — I739 Peripheral vascular disease, unspecified: Secondary | ICD-10-CM | POA: Insufficient documentation

## 2018-12-02 DIAGNOSIS — M1990 Unspecified osteoarthritis, unspecified site: Secondary | ICD-10-CM | POA: Insufficient documentation

## 2018-12-02 DIAGNOSIS — I200 Unstable angina: Secondary | ICD-10-CM

## 2018-12-02 DIAGNOSIS — R0600 Dyspnea, unspecified: Secondary | ICD-10-CM | POA: Insufficient documentation

## 2018-12-02 DIAGNOSIS — G4733 Obstructive sleep apnea (adult) (pediatric): Secondary | ICD-10-CM | POA: Insufficient documentation

## 2018-12-02 DIAGNOSIS — Z888 Allergy status to other drugs, medicaments and biological substances status: Secondary | ICD-10-CM | POA: Insufficient documentation

## 2018-12-02 DIAGNOSIS — I25110 Atherosclerotic heart disease of native coronary artery with unstable angina pectoris: Secondary | ICD-10-CM | POA: Diagnosis not present

## 2018-12-02 DIAGNOSIS — E785 Hyperlipidemia, unspecified: Secondary | ICD-10-CM | POA: Diagnosis not present

## 2018-12-02 DIAGNOSIS — Z7951 Long term (current) use of inhaled steroids: Secondary | ICD-10-CM | POA: Insufficient documentation

## 2018-12-02 DIAGNOSIS — Z955 Presence of coronary angioplasty implant and graft: Secondary | ICD-10-CM | POA: Diagnosis not present

## 2018-12-02 DIAGNOSIS — E559 Vitamin D deficiency, unspecified: Secondary | ICD-10-CM | POA: Diagnosis not present

## 2018-12-02 HISTORY — PX: LEFT HEART CATH AND CORONARY ANGIOGRAPHY: CATH118249

## 2018-12-02 SURGERY — LEFT HEART CATH AND CORONARY ANGIOGRAPHY
Anesthesia: Moderate Sedation | Laterality: Left

## 2018-12-02 MED ORDER — SODIUM CHLORIDE 0.9% FLUSH: 3.0000 mL | Freq: Two times a day (BID) | INTRAVENOUS | Status: DC

## 2018-12-02 MED ORDER — SODIUM CHLORIDE 0.9 % WEIGHT BASED INFUSION: 3.0000 mL/kg/h | INTRAVENOUS | Status: DC

## 2018-12-02 MED ORDER — ACETAMINOPHEN 325 MG PO TABS: 650.0000 mg | ORAL_TABLET | ORAL | Status: DC | PRN

## 2018-12-02 MED ORDER — NITROGLYCERIN 1 MG/10 ML FOR IR/CATH LAB
INTRA_ARTERIAL | Status: AC
  Filled 2018-12-02: qty 10

## 2018-12-02 MED ORDER — MIDAZOLAM HCL 2 MG/2ML IJ SOLN
INTRAMUSCULAR | Status: DC | PRN
  Administered 2018-12-02: 0.5 mg via INTRAVENOUS
  Administered 2018-12-02: 1 mg via INTRAVENOUS

## 2018-12-02 MED ORDER — HYDRALAZINE HCL 20 MG/ML IJ SOLN: 10.0000 mg | INTRAMUSCULAR | Status: DC | PRN

## 2018-12-02 MED ORDER — FENTANYL CITRATE (PF) 100 MCG/2ML IJ SOLN
INTRAMUSCULAR | Status: AC
  Filled 2018-12-02: qty 2

## 2018-12-02 MED ORDER — NITROGLYCERIN 1 MG/10 ML FOR IR/CATH LAB
INTRA_ARTERIAL | Status: DC | PRN
  Administered 2018-12-02: 200 ug via INTRA_ARTERIAL

## 2018-12-02 MED ORDER — FENTANYL CITRATE (PF) 100 MCG/2ML IJ SOLN
INTRAMUSCULAR | Status: DC | PRN
  Administered 2018-12-02: 50 ug via INTRAVENOUS
  Administered 2018-12-02: 25 ug via INTRAVENOUS

## 2018-12-02 MED ORDER — ONDANSETRON HCL 4 MG/2ML IJ SOLN: 4.0000 mg | Freq: Four times a day (QID) | INTRAMUSCULAR | Status: DC | PRN

## 2018-12-02 MED ORDER — SODIUM CHLORIDE 0.9% FLUSH: 3.0000 mL | INTRAVENOUS | Status: DC | PRN

## 2018-12-02 MED ORDER — HEPARIN SODIUM (PORCINE) 1000 UNIT/ML IJ SOLN
INTRAMUSCULAR | Status: DC | PRN
  Administered 2018-12-02: 3000 [IU] via INTRAVENOUS

## 2018-12-02 MED ORDER — HYDRALAZINE HCL 20 MG/ML IJ SOLN
INTRAMUSCULAR | Status: DC | PRN
  Administered 2018-12-02: 10 mg via INTRAVENOUS

## 2018-12-02 MED ORDER — SODIUM CHLORIDE 0.9 % WEIGHT BASED INFUSION: 1.0000 mL/kg/h | INTRAVENOUS | Status: DC

## 2018-12-02 MED ORDER — MIDAZOLAM HCL 2 MG/2ML IJ SOLN
INTRAMUSCULAR | Status: AC
  Filled 2018-12-02: qty 2

## 2018-12-02 MED ORDER — HEPARIN SODIUM (PORCINE) 1000 UNIT/ML IJ SOLN
INTRAMUSCULAR | Status: AC
  Filled 2018-12-02: qty 1

## 2018-12-02 MED ORDER — LABETALOL HCL 5 MG/ML IV SOLN: 10.0000 mg | INTRAVENOUS | Status: DC | PRN

## 2018-12-02 MED ORDER — SODIUM CHLORIDE 0.9 % IV SOLN: 250.0000 mL | INTRAVENOUS | Status: DC | PRN

## 2018-12-02 MED ORDER — HYDRALAZINE HCL 20 MG/ML IJ SOLN
INTRAMUSCULAR | Status: AC
  Filled 2018-12-02: qty 1

## 2018-12-02 MED ORDER — IOHEXOL 300 MG/ML  SOLN
INTRAMUSCULAR | Status: DC | PRN
  Administered 2018-12-02: 17:00:00 75 mL via INTRA_ARTICULAR

## 2018-12-02 MED ORDER — VERAPAMIL HCL 2.5 MG/ML IV SOLN
INTRAVENOUS | Status: AC
  Filled 2018-12-02: qty 2

## 2018-12-02 MED ORDER — VERAPAMIL HCL 2.5 MG/ML IV SOLN
INTRAVENOUS | Status: DC | PRN
  Administered 2018-12-02: 2.5 mg via INTRA_ARTERIAL
  Administered 2018-12-02: 2.5 mg via INTRAVENOUS

## 2018-12-02 SURGICAL SUPPLY — 14 items
CATH INFINITI 5 FR JL3.5 (CATHETERS) ×3 IMPLANT
CATH INFINITI 5FR ANG PIGTAIL (CATHETERS) ×3 IMPLANT
CATH INFINITI 5FR JL4 (CATHETERS) ×3 IMPLANT
CATH INFINITI JR4 5F (CATHETERS) ×3 IMPLANT
DEVICE CLOSURE MYNXGRIP 5F (Vascular Products) ×3 IMPLANT
DEVICE RAD TR BAND REGULAR (VASCULAR PRODUCTS) ×3 IMPLANT
GLIDESHEATH SLEND SS 6F .021 (SHEATH) ×3 IMPLANT
KIT MANI 3VAL PERCEP (MISCELLANEOUS) ×3 IMPLANT
NEEDLE PERC 18GX7CM (NEEDLE) ×3 IMPLANT
PACK CARDIAC CATH (CUSTOM PROCEDURE TRAY) ×3 IMPLANT
SHEATH AVANTI 5FR X 11CM (SHEATH) ×3 IMPLANT
WIRE GUIDERIGHT .035X150 (WIRE) ×3 IMPLANT
WIRE HITORQ VERSACORE ST 145CM (WIRE) ×3 IMPLANT
WIRE ROSEN-J .035X260CM (WIRE) ×3 IMPLANT

## 2018-12-03 ENCOUNTER — Encounter: Payer: Self-pay | Admitting: Cardiovascular Disease

## 2018-12-24 ENCOUNTER — Encounter: Payer: Medicare HMO | Admitting: Obstetrics and Gynecology

## 2019-01-02 ENCOUNTER — Other Ambulatory Visit: Payer: Self-pay | Admitting: Internal Medicine

## 2019-01-02 DIAGNOSIS — Z1231 Encounter for screening mammogram for malignant neoplasm of breast: Secondary | ICD-10-CM

## 2019-01-09 ENCOUNTER — Ambulatory Visit
Admission: RE | Admit: 2019-01-09 | Discharge: 2019-01-09 | Disposition: A | Discharge status: Home / Self Care | Payer: Medicare Other | Source: Ambulatory Visit | Attending: Internal Medicine | Admitting: Internal Medicine

## 2019-01-09 ENCOUNTER — Other Ambulatory Visit: Payer: Self-pay

## 2019-01-09 DIAGNOSIS — Z1231 Encounter for screening mammogram for malignant neoplasm of breast: Secondary | ICD-10-CM | POA: Insufficient documentation

## 2019-01-23 ENCOUNTER — Other Ambulatory Visit: Payer: Self-pay | Admitting: Family

## 2019-01-23 DIAGNOSIS — R55 Syncope and collapse: Secondary | ICD-10-CM

## 2019-01-27 ENCOUNTER — Ambulatory Visit: Payer: Medicare Other

## 2019-02-06 ENCOUNTER — Other Ambulatory Visit: Payer: Self-pay

## 2019-02-06 ENCOUNTER — Ambulatory Visit
Admission: RE | Admit: 2019-02-06 | Discharge: 2019-02-06 | Disposition: A | Discharge status: Home / Self Care | Payer: Medicare Other | Source: Ambulatory Visit | Attending: Family | Admitting: Family

## 2019-02-06 DIAGNOSIS — R55 Syncope and collapse: Secondary | ICD-10-CM

## 2019-02-11 ENCOUNTER — Other Ambulatory Visit: Payer: Self-pay

## 2019-02-11 ENCOUNTER — Inpatient Hospital Stay: Payer: Medicare Other | Attending: Oncology

## 2019-02-11 ENCOUNTER — Inpatient Hospital Stay (HOSPITAL_BASED_OUTPATIENT_CLINIC_OR_DEPARTMENT_OTHER): Payer: Medicare Other | Admitting: Oncology

## 2019-02-11 VITALS — BP 129/73 | HR 92 | Temp 97.9°F | Resp 16 | Wt 157.6 lb

## 2019-02-11 DIAGNOSIS — R5383 Other fatigue: Secondary | ICD-10-CM

## 2019-02-11 DIAGNOSIS — E039 Hypothyroidism, unspecified: Secondary | ICD-10-CM | POA: Insufficient documentation

## 2019-02-11 DIAGNOSIS — C9110 Chronic lymphocytic leukemia of B-cell type not having achieved remission: Secondary | ICD-10-CM | POA: Insufficient documentation

## 2019-02-11 DIAGNOSIS — N183 Chronic kidney disease, stage 3 unspecified: Secondary | ICD-10-CM

## 2019-02-11 DIAGNOSIS — F17210 Nicotine dependence, cigarettes, uncomplicated: Secondary | ICD-10-CM

## 2019-02-11 DIAGNOSIS — I129 Hypertensive chronic kidney disease with stage 1 through stage 4 chronic kidney disease, or unspecified chronic kidney disease: Secondary | ICD-10-CM | POA: Insufficient documentation

## 2019-02-11 DIAGNOSIS — R634 Abnormal weight loss: Secondary | ICD-10-CM | POA: Diagnosis not present

## 2019-02-11 LAB — CBC WITH DIFFERENTIAL/PLATELET
Abs Immature Granulocytes: 0.04 10*3/uL (ref 0.00–0.07)
Basophils Absolute: 0 10*3/uL (ref 0.0–0.1)
Basophils Relative: 0 %
Eosinophils Absolute: 0.2 10*3/uL (ref 0.0–0.5)
Eosinophils Relative: 1 %
HCT: 40.3 % (ref 36.0–46.0)
Hemoglobin: 13.2 g/dL (ref 12.0–15.0)
Immature Granulocytes: 0 %
Lymphocytes Relative: 64 %
Lymphs Abs: 10.3 10*3/uL — ABNORMAL HIGH (ref 0.7–4.0)
MCH: 30.9 pg (ref 26.0–34.0)
MCHC: 32.8 g/dL (ref 30.0–36.0)
MCV: 94.4 fL (ref 80.0–100.0)
Monocytes Absolute: 0.9 10*3/uL (ref 0.1–1.0)
Monocytes Relative: 6 %
Neutro Abs: 4.6 10*3/uL (ref 1.7–7.7)
Neutrophils Relative %: 29 %
Platelets: 250 10*3/uL (ref 150–400)
RBC: 4.27 MIL/uL (ref 3.87–5.11)
RDW: 13.2 % (ref 11.5–15.5)
WBC: 16 10*3/uL — ABNORMAL HIGH (ref 4.0–10.5)
nRBC: 0 % (ref 0.0–0.2)

## 2019-02-11 LAB — COMPREHENSIVE METABOLIC PANEL
ALT: 16 U/L (ref 0–44)
AST: 15 U/L (ref 15–41)
Albumin: 4.2 g/dL (ref 3.5–5.0)
Alkaline Phosphatase: 67 U/L (ref 38–126)
Anion gap: 8 (ref 5–15)
BUN: 18 mg/dL (ref 8–23)
CO2: 25 mmol/L (ref 22–32)
Calcium: 10.2 mg/dL (ref 8.9–10.3)
Chloride: 103 mmol/L (ref 98–111)
Creatinine, Ser: 1.2 mg/dL — ABNORMAL HIGH (ref 0.44–1.00)
GFR calc Af Amer: 52 mL/min — ABNORMAL LOW (ref >60)
GFR calc non Af Amer: 44 mL/min — ABNORMAL LOW (ref >60)
Glucose, Bld: 121 mg/dL — ABNORMAL HIGH (ref 70–99)
Potassium: 4.1 mmol/L (ref 3.5–5.1)
Sodium: 136 mmol/L (ref 135–145)
Total Bilirubin: 0.4 mg/dL (ref 0.3–1.2)
Total Protein: 7.5 g/dL (ref 6.5–8.1)

## 2019-02-11 LAB — LACTATE DEHYDROGENASE: LDH: 114 U/L (ref 98–192)

## 2019-02-11 NOTE — Progress Notes (Signed)
Patient does not offer any problems today.  

## 2019-02-12 ENCOUNTER — Encounter: Payer: Self-pay | Admitting: Oncology

## 2019-02-12 NOTE — Progress Notes (Signed)
Hematology/Oncology Follow up  note [facility] [phone] [phone]   Patient Care Team: Perrin Maltese, MD as PCP - General (Internal Medicine)  REFERRING PROVIDER:  Perrin Maltese, MD  REASON FOR VISIT Follow up for treatment of CLL  HISTORY OF PRESENTING ILLNESS:  Karen [name] is a  73 y.o.  female with PMH listed below who was referred to me for evaluation of persistent leukocytosis/lymphocytosis. Reviewed patient's lab work done at PCPs office. 05/01/2017 WBC 11.7 absolute lymphocytes 7.4. Hemoglobin 13 MCV 95 platelet counts 225,000. 06/22/2017 WBC is 17.5, absolute lymphocyte 11.7 hemoglobin 11.9, MCV 92, platelet count 240,000. Patient reports feeling tired at baseline. Otherwise denies any night sweats, fever or chills. She reports she got sick around holiday. But today she feels better. She is active smoker, currently 1 or 2 cigarettes a day  INTERVAL HISTORY Karen [name] is a 73 y.o. female who has above history reviewed by me today present for follow-up visit for CLL Patient reports feeling pretty well today.  Denies any weight loss, night sweats, fever or chills Patient reports chronic fatigue has gotten worse.  new to smoke daily.  EReports feeling pretty well today.  Denies any weight loss, night sweats, fever or chills.  Chronic fatigue stable.  Continues to smoke daily. Appetite is not very good her weight has been stable comparing to 6 months ago.  However her weight has decreased 9 pounds since 1 year ago.  Review of Systems  Constitutional: Positive for malaise/fatigue. Negative for chills, fever and weight loss.  HENT: Negative for hearing loss, nosebleeds and sore throat.   Eyes: Negative for blurred vision, double vision, photophobia and redness.  Respiratory: Negative for cough, hemoptysis, sputum production, shortness of breath and wheezing.   Cardiovascular: Negative for chest pain, palpitations, orthopnea  and leg swelling.  Gastrointestinal: Negative for abdominal pain, blood in stool, heartburn, nausea and vomiting.  Genitourinary: Negative for dysuria and frequency.  Musculoskeletal: Negative for back pain, myalgias and neck pain.  Skin: Negative for itching and rash.  Neurological: Negative for dizziness, tingling, tremors and weakness.  Endo/Heme/Allergies: Negative for environmental allergies. Does not bruise/bleed easily.  Psychiatric/Behavioral: Negative for depression, hallucinations and substance abuse.    MEDICAL HISTORY:  Past Medical History:  Diagnosis Date  . Anxiety   . Artery occlusion   . Asthma   . Back pain   . Bronchitis   . Cancer ([place])    Skin Cancer  . CLL (chronic lymphocytic leukemia) ([place])   . Coronary artery disease   . Depression   . Dyspnea   . GERD (gastroesophageal reflux disease)   . History of kidney stones   . Hypercholesteremia   . Hyperlipemia   . Hypertension   . Hypothyroidism   . Myocardial infarction ([place]) 1999  . Neuropathy   . Restless leg syndrome   . Sleep apnea    use C-PAP  . Wears dentures    full upper, partial lower.  doesn't currently wear    SURGICAL HISTORY: Past Surgical History:  Procedure Laterality Date  . COLONOSCOPY WITH PROPOFOL N/A 11/20/2016   Procedure: COLONOSCOPY WITH PROPOFOL;  Surgeon: Lucilla Lame, MD;  Location: [place];  Service: Endoscopy;  Laterality: N/A;  sleep apnea  . CORONARY ANGIOPLASTY WITH STENT PLACEMENT  2008  . DILATION AND CURETTAGE OF UTERUS    . EYE SURGERY Bilateral    Cataract Extraction with IOL  . HAND SURGERY Left 12/2017   upper hand fracture  repair  . LEFT HEART CATH AND CORONARY ANGIOGRAPHY N/A 06/29/2017   Procedure: LEFT HEART CATH AND CORONARY ANGIOGRAPHY;  Surgeon: Dionisio David, MD;  Location: [place] [facility];  Service: Cardiovascular;  Laterality: N/A;  . LEFT HEART CATH AND CORONARY ANGIOGRAPHY Left 12/02/2018   Procedure: LEFT HEART CATH AND CORONARY  ANGIOGRAPHY;  Surgeon: Dionisio David, MD;  Location: [place] [facility];  Service: Cardiovascular;  Laterality: Left;  . OPEN REDUCTION INTERNAL FIXATION (ORIF) DISTAL RADIAL FRACTURE Right 08/12/2018   Procedure: OPEN REDUCTION INTERNAL FIXATION (ORIF) DISTAL RADIAL FRACTURE;  Surgeon: [name], MD;  Location: [facility];  Service: Orthopedics;  Laterality: Right;  . POLYPECTOMY  11/20/2016   Procedure: POLYPECTOMY;  Surgeon: Lucilla Lame, MD;  Location: [place];  Service: Endoscopy;;  . TUBAL LIGATION    . VAGINAL HYSTERECTOMY    . VULVECTOMY Right 01/31/2016   Procedure: WIDE EXCISION VULVECTOMY-RIGHT LABIA;  Surgeon: Brayton Mars, MD;  Location: [facility];  Service: Gynecology;  Laterality: Right;    SOCIAL HISTORY: Social History   Socioeconomic History  . Marital status: Single    Spouse name: Not on file  . Number of children: 5  . Years of education: Not on file  . Highest education level: Not on file  Occupational History  . Occupation: part time job    Comment: [employer]  . Financial resource strain: Not very hard  . Food insecurity    Worry: Never true    Inability: Never true  . Transportation needs    Medical: No    Non-medical: No  Tobacco Use  . Smoking status: Current Every Day Smoker    Packs/day: 0.50    Years: 40.00    Pack years: 20.00    Types: Cigarettes  . Smokeless tobacco: Current User    Types: Snuff  Substance and Sexual Activity  . Alcohol use: No    Alcohol/week: 0.0 standard drinks  . Drug use: No  . Sexual activity: Not Currently    Partners: Male    Birth control/protection: Surgical  Lifestyle  . Physical activity    Days per week: 0 days    Minutes per session: 0 min  . Stress: Only a little  Relationships  . Social connections    Talks on phone: More than three times a week    Gets together: Not on file    Attends religious service: Not on file    Active member of club or organization:  Not on file    Attends meetings of clubs or organizations: Not on file    Relationship status: Not on file  . Intimate partner violence    Fear of current or ex partner: No    Emotionally abused: No    Physically abused: No    Forced sexual activity: No  Other Topics Concern  . Not on file  Social History Narrative  . Not on file    FAMILY HISTORY: Family History  Problem Relation Age of Onset  . Heart disease Father   . Stroke Mother   . Hypertension Mother   . Lung cancer Brother   . Heart attack Brother   . Congestive Heart Failure Sister   . Cervical cancer Sister   . Cancer Neg Hx   . Diabetes Neg Hx   . Breast cancer Neg Hx     ALLERGIES:  is allergic to requip [ropinirole hcl]; celebrex [celecoxib]; meloxicam; and nitrofurantoin.  MEDICATIONS:  Current Outpatient Medications  Medication Sig Dispense Refill  . acetaminophen (TYLENOL) 500 MG tablet Take 1,000 mg by mouth every 6 (six) hours as needed for moderate pain or headache.    . alendronate (FOSAMAX) 70 MG tablet Take 70 mg by mouth every Wednesday.     Marland Kitchen amiodarone (PACERONE) 200 MG tablet Take 200 mg by mouth daily.    Marland Kitchen aspirin EC 81 MG tablet Take 81 mg by mouth daily.    . budesonide-formoterol (SYMBICORT) 160-4.5 MCG/ACT inhaler Inhale 2 puffs into the lungs 2 (two) times daily as needed (shortness of breath).    . cholecalciferol (VITAMIN D3) 25 MCG (1000 UT) tablet Take 1,000 Units by mouth daily.    . clopidogrel (PLAVIX) 75 MG tablet Take 75 mg by mouth daily.     Marland Kitchen dexlansoprazole (DEXILANT) 60 MG capsule Take 60 mg by mouth daily.     . DULoxetine (CYMBALTA) 60 MG capsule Take 60 mg by mouth daily.    . fluticasone (FLONASE) 50 MCG/ACT nasal spray Place 1 spray into both nostrils daily as needed for allergies or rhinitis.    . furosemide (LASIX) 20 MG tablet Take 20 mg by mouth daily.  3  . gabapentin (NEURONTIN) 400 MG capsule Take 400 mg by mouth 2 (two) times daily.     . hydrALAZINE  (APRESOLINE) 25 MG tablet Take 25 mg by mouth daily.    Marland Kitchen HYDROcodone-acetaminophen (NORCO) 7.5-325 MG tablet Take 1-2 tablets by mouth every 6 (six) hours as needed for moderate pain. 30 tablet 0  . isosorbide mononitrate (IMDUR) 60 MG 24 hr tablet Take 60 mg by mouth daily.    Marland Kitchen lisinopril (PRINIVIL,ZESTRIL) 40 MG tablet Take 40 mg by mouth daily.    . metoprolol succinate (TOPROL-XL) 50 MG 24 hr tablet Take 25 mg by mouth daily.     . Nebulizers (COMPRESSOR/NEBULIZER) MISC 1 Units by Does not apply route every 4 (four) hours as needed. 1 each 0  . nitroGLYCERIN (NITROSTAT) 0.4 MG SL tablet Place 0.4 mg under the tongue every 5 (five) minutes as needed for chest pain.     . potassium chloride (K-DUR,KLOR-CON) 10 MEQ tablet Take 10 mEq by mouth daily.    Marland Kitchen PREMARIN vaginal cream PLACE 5.85 (2/7PO) APPLICATORFUL VAGINALLY 2 (TWO) TIMES A WEEK. (Patient taking differently: Place 2.42 Applicatorfuls vaginally every Monday. ) 30 g 12  . Tetrahydrozoline HCl (VISINE OP) Place 1 drop into both eyes daily as needed (dry eyes).    . traZODone (DESYREL) 100 MG tablet Take 50 mg by mouth at bedtime as needed for sleep.   0  . VIIBRYD 20 MG TABS 10 mg for 7 days then increase to 20mg .    . albuterol (PROVENTIL) (2.5 MG/3ML) 0.083% nebulizer solution Take 3 mLs (2.5 mg total) by nebulization every 4 (four) hours as needed for wheezing or shortness of breath. (Patient not taking: Reported on 12/02/2018) 75 mL 0  . simvastatin (ZOCOR) 80 MG tablet Take 80 mg by mouth daily.     No current facility-administered medications for this visit.      PHYSICAL EXAMINATION: ECOG PERFORMANCE STATUS: 1 - Symptomatic but completely ambulatory Vitals:   02/11/19 1427  BP: 129/73  Pulse: 92  Resp: 16  Temp: 97.9 F (36.6 C)   Filed Weights   02/11/19 1427  Weight: 157 lb 9.6 oz (71.5 kg)    Physical Exam  Constitutional: She is oriented to person, place, and time and well-developed, well-nourished, and in  no  distress. No distress.  HENT:  Head: Normocephalic and atraumatic.  Nose: Nose normal.  Mouth/Throat: Oropharynx is clear and moist. No oropharyngeal exudate.  Eyes: Pupils are equal, round, and reactive to light. Conjunctivae and EOM are normal. Left eye exhibits no discharge. No scleral icterus.  Neck: Normal range of motion. Neck supple. No JVD present.  Cardiovascular: Normal rate and regular rhythm.  No murmur heard. Pulmonary/Chest: Effort normal. No respiratory distress. She has no rales. She exhibits no tenderness.  Decreased breath sound bilaterally  Abdominal: Soft. Bowel sounds are normal. She exhibits no distension.  Musculoskeletal: Normal range of motion.        General: No edema.  Lymphadenopathy:    She has no cervical adenopathy.  Neurological: She is alert and oriented to person, place, and time. No cranial nerve deficit. She exhibits normal muscle tone. Coordination normal.  Skin: Skin is warm and dry. She is not diaphoretic. No erythema.  Psychiatric: Affect and judgment normal.     LABORATORY DATA:  I have reviewed the data as listed Lab Results  Component Value Date   WBC 16.0 (H) 02/11/2019   HGB 13.2 02/11/2019   HCT 40.3 02/11/2019   MCV 94.4 02/11/2019   PLT 250 02/11/2019   Recent Labs    05/09/18 0030  08/22/18 0952 11/23/18 0358 02/11/19 1352  NA 138   < > 138 139 136  K 3.4*   < > 3.9 3.5 4.1  CL 103   < > 101 104 103  CO2 29   < > 30 28 25   GLUCOSE 132*   < > 95 108* 121*  BUN 9   < > 14 12 18   CREATININE 1.19*   < > 1.01* 1.26* 1.20*  CALCIUM 9.7   < > 9.9 10.0 10.2  GFRNONAA 44*   < > 55* 42* 44*  GFRAA 51*   < > >60 49* 52*  PROT 7.1  --  7.2  --  7.5  ALBUMIN 4.2  --  3.9  --  4.2  AST 20  --  14*  --  15  ALT 17  --  11  --  16  ALKPHOS 76  --  86  --  67  BILITOT 0.3  --  0.6  --  0.4   < > = values in this interval not displayed.    Serum Osmolarity 266, urine Osm 374, urine sodium 21.    ASSESSMENT & PLAN:  1. CLL  (chronic lymphocytic leukemia) ([facility])   2. Other fatigue   3. Weight loss   4. Stage 3 chronic kidney disease ([place])   Labs reviewed and discussed with patient. Patient has worsening of leukocytosis. Worsening fatigue and  unintentional weight loss.    Reviewed previous work up.  10/14/2015 chest without contrast Images were independently reviewed by me Small pulmonary nodules are stable over the past year.  Considered benign.  Several lymph nodes are borderline enlarged, including the lower right paratracheal lymph node and a portacaval lymph node. 08/27/2017 US abdomen complete, no hepato-splenomegaly. Since she may start to have B symptoms, I will start with obtaining abdominal ultrasound for further evaluation.  CKD stage 3, creatinine is rising.  Short term follow up in 3 months. May change pending above work up   All questions were answered. The patient knows to call the clinic with any problems questions or concerns. Orders Placed This Encounter  Procedures  . US Abdomen Complete  Standing Status:   Future    Standing Expiration Date:   02/11/2020    Order Specific Question:   Reason for Exam (SYMPTOM  OR DIAGNOSIS REQUIRED)    Answer:   CLL, weight loss, sweats    Order Specific Question:   Preferred imaging location?    Answer:   [facility]    Return of visit  3 months Earlie Server, MD, PhD Hematology Oncology [facility] 02/12/2019

## 2019-02-19 ENCOUNTER — Other Ambulatory Visit: Payer: Self-pay

## 2019-02-19 ENCOUNTER — Ambulatory Visit
Admission: RE | Admit: 2019-02-19 | Discharge: 2019-02-19 | Disposition: A | Discharge status: Home / Self Care | Payer: Medicare Other | Source: Ambulatory Visit | Attending: Oncology | Admitting: Oncology

## 2019-02-19 DIAGNOSIS — C9110 Chronic lymphocytic leukemia of B-cell type not having achieved remission: Secondary | ICD-10-CM | POA: Diagnosis not present

## 2019-02-20 ENCOUNTER — Ambulatory Visit: Payer: Self-pay | Admitting: Oncology

## 2019-02-20 ENCOUNTER — Other Ambulatory Visit: Payer: Self-pay

## 2019-02-21 ENCOUNTER — Other Ambulatory Visit: Payer: Self-pay

## 2019-02-21 ENCOUNTER — Ambulatory Visit: Payer: Self-pay | Admitting: Oncology

## 2019-03-26 ENCOUNTER — Inpatient Hospital Stay: Payer: Medicare Other | Attending: Oncology

## 2019-03-26 ENCOUNTER — Inpatient Hospital Stay (HOSPITAL_BASED_OUTPATIENT_CLINIC_OR_DEPARTMENT_OTHER): Payer: Medicare Other | Admitting: Oncology

## 2019-03-26 ENCOUNTER — Other Ambulatory Visit: Payer: Self-pay

## 2019-03-26 ENCOUNTER — Encounter: Payer: Self-pay | Admitting: Oncology

## 2019-03-26 ENCOUNTER — Other Ambulatory Visit: Payer: Self-pay | Admitting: Oncology

## 2019-03-26 VITALS — BP 156/75 | HR 65 | Temp 99.3°F | Wt 161.3 lb

## 2019-03-26 DIAGNOSIS — R918 Other nonspecific abnormal finding of lung field: Secondary | ICD-10-CM | POA: Insufficient documentation

## 2019-03-26 DIAGNOSIS — F17210 Nicotine dependence, cigarettes, uncomplicated: Secondary | ICD-10-CM | POA: Diagnosis not present

## 2019-03-26 DIAGNOSIS — E039 Hypothyroidism, unspecified: Secondary | ICD-10-CM | POA: Diagnosis not present

## 2019-03-26 DIAGNOSIS — I2510 Atherosclerotic heart disease of native coronary artery without angina pectoris: Secondary | ICD-10-CM | POA: Insufficient documentation

## 2019-03-26 DIAGNOSIS — C9110 Chronic lymphocytic leukemia of B-cell type not having achieved remission: Secondary | ICD-10-CM

## 2019-03-26 DIAGNOSIS — R634 Abnormal weight loss: Secondary | ICD-10-CM

## 2019-03-26 DIAGNOSIS — Z79899 Other long term (current) drug therapy: Secondary | ICD-10-CM | POA: Insufficient documentation

## 2019-03-26 DIAGNOSIS — R5383 Other fatigue: Secondary | ICD-10-CM

## 2019-03-26 DIAGNOSIS — Z720 Tobacco use: Secondary | ICD-10-CM

## 2019-03-26 DIAGNOSIS — I10 Essential (primary) hypertension: Secondary | ICD-10-CM | POA: Diagnosis not present

## 2019-03-26 LAB — COMPREHENSIVE METABOLIC PANEL
ALT: 15 U/L (ref 0–44)
AST: 15 U/L (ref 15–41)
Albumin: 4.3 g/dL (ref 3.5–5.0)
Alkaline Phosphatase: 55 U/L (ref 38–126)
Anion gap: 7 (ref 5–15)
BUN: 8 mg/dL (ref 8–23)
CO2: 27 mmol/L (ref 22–32)
Calcium: 9.9 mg/dL (ref 8.9–10.3)
Chloride: 105 mmol/L (ref 98–111)
Creatinine, Ser: 1.02 mg/dL — ABNORMAL HIGH (ref 0.44–1.00)
GFR calc Af Amer: >60 mL/min (ref >60)
GFR calc non Af Amer: 54 mL/min — ABNORMAL LOW (ref >60)
Glucose, Bld: 130 mg/dL — ABNORMAL HIGH (ref 70–99)
Potassium: 4 mmol/L (ref 3.5–5.1)
Sodium: 139 mmol/L (ref 135–145)
Total Bilirubin: 0.5 mg/dL (ref 0.3–1.2)
Total Protein: 7.2 g/dL (ref 6.5–8.1)

## 2019-03-26 LAB — CBC WITH DIFFERENTIAL/PLATELET
Abs Immature Granulocytes: 0.01 10*3/uL (ref 0.00–0.07)
Basophils Absolute: 0 10*3/uL (ref 0.0–0.1)
Basophils Relative: 0 %
Eosinophils Absolute: 0.2 10*3/uL (ref 0.0–0.5)
Eosinophils Relative: 2 %
HCT: 38.3 % (ref 36.0–46.0)
Hemoglobin: 12.7 g/dL (ref 12.0–15.0)
Immature Granulocytes: 0 %
Lymphocytes Relative: 72 %
Lymphs Abs: 8.1 10*3/uL — ABNORMAL HIGH (ref 0.7–4.0)
MCH: 31.1 pg (ref 26.0–34.0)
MCHC: 33.2 g/dL (ref 30.0–36.0)
MCV: 93.6 fL (ref 80.0–100.0)
Monocytes Absolute: 0.7 10*3/uL (ref 0.1–1.0)
Monocytes Relative: 6 %
Neutro Abs: 2.3 10*3/uL (ref 1.7–7.7)
Neutrophils Relative %: 20 %
Platelets: 174 10*3/uL (ref 150–400)
RBC: 4.09 MIL/uL (ref 3.87–5.11)
RDW: 13.2 % (ref 11.5–15.5)
Smear Review: NORMAL
WBC: 11.4 10*3/uL — ABNORMAL HIGH (ref 4.0–10.5)
nRBC: 0 % (ref 0.0–0.2)

## 2019-03-26 LAB — LACTATE DEHYDROGENASE: LDH: 118 U/L (ref 98–192)

## 2019-03-26 NOTE — Progress Notes (Signed)
Tried calling patient and no answer, left message to call back and I will try to call back later this afternoon

## 2019-03-27 NOTE — Progress Notes (Signed)
Hematology/Oncology Follow up  note [facility] [phone] [phone]   Patient Care Team: Perrin Maltese, MD as PCP - General (Internal Medicine)  REFERRING PROVIDER:  Perrin Maltese, MD  REASON FOR VISIT Follow up for treatment of CLL  HISTORY OF PRESENTING ILLNESS:  Karen [name] is a  73 y.o.  female with PMH listed below who was referred to me for evaluation of persistent leukocytosis/lymphocytosis. Reviewed patient's lab work done at PCPs office. 05/01/2017 WBC 11.7 absolute lymphocytes 7.4. Hemoglobin 13 MCV 95 platelet counts 225,000. 06/22/2017 WBC is 17.5, absolute lymphocyte 11.7 hemoglobin 11.9, MCV 92, platelet count 240,000. Patient reports feeling tired at baseline. Otherwise denies any night sweats, fever or chills. She reports she got sick around holiday. But today she feels better. She is active smoker, currently 1 or 2 cigarettes a day  # 10/14/2015 chest without contrast Images were independently reviewed by me Small pulmonary nodules are stable over the past year.  Considered benign.  Several lymph nodes are borderline enlarged, including the lower right paratracheal lymph node and a portacaval lymph node. 08/27/2017 US abdomen complete, no hepato-splenomegaly.   INTERVAL HISTORY Karen [name] is a 73 y.o. female who has above history reviewed by me today present for follow-up visit for CLL Patient reports doing well.  Denies any unintentional weight loss, night sweats, fever or chills. Chronic fatigue has slightly improved. Continues to smoke daily. Appetite is okay.  Weight has been stable.  Gained 4 pounds since 2 months ago.  Review of Systems  Constitutional: Positive for fatigue. Negative for appetite change, chills and fever.  HENT:   Negative for hearing loss and voice change.   Eyes: Negative for eye problems.  Respiratory: Negative for chest tightness and cough.   Cardiovascular: Negative for chest pain.   Gastrointestinal: Negative for abdominal distention, abdominal pain and blood in stool.  Endocrine: Negative for hot flashes.  Genitourinary: Negative for difficulty urinating and frequency.   Musculoskeletal: Negative for arthralgias.  Skin: Negative for itching and rash.  Neurological: Negative for extremity weakness.  Hematological: Negative for adenopathy.  Psychiatric/Behavioral: Negative for confusion.     MEDICAL HISTORY:  Past Medical History:  Diagnosis Date  . Anxiety   . Artery occlusion   . Asthma   . Back pain   . Bronchitis   . Cancer ([place])    Skin Cancer  . CLL (chronic lymphocytic leukemia) ([place])   . Coronary artery disease   . Depression   . Dyspnea   . GERD (gastroesophageal reflux disease)   . History of kidney stones   . Hypercholesteremia   . Hyperlipemia   . Hypertension   . Hypothyroidism   . Myocardial infarction ([place]) 1999  . Neuropathy   . Restless leg syndrome   . Sleep apnea    use C-PAP  . Wears dentures    full upper, partial lower.  doesn't currently wear    SURGICAL HISTORY: Past Surgical History:  Procedure Laterality Date  . COLONOSCOPY WITH PROPOFOL N/A 11/20/2016   Procedure: COLONOSCOPY WITH PROPOFOL;  Surgeon: Lucilla Lame, MD;  Location: [place];  Service: Endoscopy;  Laterality: N/A;  sleep apnea  . CORONARY ANGIOPLASTY WITH STENT PLACEMENT  2008  . DILATION AND CURETTAGE OF UTERUS    . EYE SURGERY Bilateral    Cataract Extraction with IOL  . HAND SURGERY Left 12/2017   upper hand fracture repair  . LEFT HEART CATH AND CORONARY ANGIOGRAPHY N/A 06/29/2017  Procedure: LEFT HEART CATH AND CORONARY ANGIOGRAPHY;  Surgeon: Dionisio David, MD;  Location: [place] [facility];  Service: Cardiovascular;  Laterality: N/A;  . LEFT HEART CATH AND CORONARY ANGIOGRAPHY Left 12/02/2018   Procedure: LEFT HEART CATH AND CORONARY ANGIOGRAPHY;  Surgeon: Dionisio David, MD;  Location: [place] [facility];  Service:  Cardiovascular;  Laterality: Left;  . OPEN REDUCTION INTERNAL FIXATION (ORIF) DISTAL RADIAL FRACTURE Right 08/12/2018   Procedure: OPEN REDUCTION INTERNAL FIXATION (ORIF) DISTAL RADIAL FRACTURE;  Surgeon: [name], MD;  Location: [facility];  Service: Orthopedics;  Laterality: Right;  . POLYPECTOMY  11/20/2016   Procedure: POLYPECTOMY;  Surgeon: Lucilla Lame, MD;  Location: [place];  Service: Endoscopy;;  . TUBAL LIGATION    . VAGINAL HYSTERECTOMY    . VULVECTOMY Right 01/31/2016   Procedure: WIDE EXCISION VULVECTOMY-RIGHT LABIA;  Surgeon: Brayton Mars, MD;  Location: [facility];  Service: Gynecology;  Laterality: Right;    SOCIAL HISTORY: Social History   Socioeconomic History  . Marital status: Single    Spouse name: Not on file  . Number of children: 5  . Years of education: Not on file  . Highest education level: Not on file  Occupational History  . Occupation: part time job    Comment: [place]  . Financial resource strain: Not very hard  . Food insecurity    Worry: Never true    Inability: Never true  . Transportation needs    Medical: No    Non-medical: No  Tobacco Use  . Smoking status: Current Every Day Smoker    Packs/day: 0.50    Years: 40.00    Pack years: 20.00    Types: Cigarettes  . Smokeless tobacco: Current User    Types: Snuff  Substance and Sexual Activity  . Alcohol use: No    Alcohol/week: 0.0 standard drinks  . Drug use: No  . Sexual activity: Not Currently    Partners: Male    Birth control/protection: Surgical  Lifestyle  . Physical activity    Days per week: 0 days    Minutes per session: 0 min  . Stress: Only a little  Relationships  . Social connections    Talks on phone: More than three times a week    Gets together: Not on file    Attends religious service: Not on file    Active member of club or organization: Not on file    Attends meetings of clubs or organizations: Not on file    Relationship  status: Not on file  . Intimate partner violence    Fear of current or ex partner: No    Emotionally abused: No    Physically abused: No    Forced sexual activity: No  Other Topics Concern  . Not on file  Social History Narrative  . Not on file    FAMILY HISTORY: Family History  Problem Relation Age of Onset  . Heart disease Father   . Stroke Mother   . Hypertension Mother   . Lung cancer Brother   . Heart attack Brother   . Congestive Heart Failure Sister   . Cervical cancer Sister   . Cancer Neg Hx   . Diabetes Neg Hx   . Breast cancer Neg Hx     ALLERGIES:  is allergic to requip [ropinirole hcl]; celebrex [celecoxib]; meloxicam; and nitrofurantoin.  MEDICATIONS:  Current Outpatient Medications  Medication Sig Dispense Refill  . acetaminophen (TYLENOL) 500  MG tablet Take 1,000 mg by mouth every 6 (six) hours as needed for moderate pain or headache.    . albuterol (PROVENTIL) (2.5 MG/3ML) 0.083% nebulizer solution Take 3 mLs (2.5 mg total) by nebulization every 4 (four) hours as needed for wheezing or shortness of breath. 75 mL 0  . alendronate (FOSAMAX) 70 MG tablet Take 70 mg by mouth every Wednesday.     Marland Kitchen amiodarone (PACERONE) 200 MG tablet Take 200 mg by mouth daily.    Marland Kitchen aspirin EC 81 MG tablet Take 81 mg by mouth daily.    . budesonide-formoterol (SYMBICORT) 160-4.5 MCG/ACT inhaler Inhale 2 puffs into the lungs 2 (two) times daily as needed (shortness of breath).    . cholecalciferol (VITAMIN D3) 25 MCG (1000 UT) tablet Take 1,000 Units by mouth daily.    . clopidogrel (PLAVIX) 75 MG tablet Take 75 mg by mouth daily.     Marland Kitchen dexlansoprazole (DEXILANT) 60 MG capsule Take 60 mg by mouth daily.     . DULoxetine (CYMBALTA) 60 MG capsule Take 60 mg by mouth daily.    . fluticasone (FLONASE) 50 MCG/ACT nasal spray Place 1 spray into both nostrils daily as needed for allergies or rhinitis.    . furosemide (LASIX) 20 MG tablet Take 20 mg by mouth daily.  3  . gabapentin  (NEURONTIN) 400 MG capsule Take 400 mg by mouth 2 (two) times daily.     . hydrALAZINE (APRESOLINE) 25 MG tablet Take 25 mg by mouth daily.    Marland Kitchen HYDROcodone-acetaminophen (NORCO) 7.5-325 MG tablet Take 1-2 tablets by mouth every 6 (six) hours as needed for moderate pain. 30 tablet 0  . isosorbide mononitrate (IMDUR) 60 MG 24 hr tablet Take 60 mg by mouth daily.    Marland Kitchen lisinopril (PRINIVIL,ZESTRIL) 40 MG tablet Take 40 mg by mouth daily.    . metoprolol succinate (TOPROL-XL) 50 MG 24 hr tablet Take 25 mg by mouth daily.     . Nebulizers (COMPRESSOR/NEBULIZER) MISC 1 Units by Does not apply route every 4 (four) hours as needed. 1 each 0  . nitroGLYCERIN (NITROSTAT) 0.4 MG SL tablet Place 0.4 mg under the tongue every 5 (five) minutes as needed for chest pain.     . potassium chloride (K-DUR,KLOR-CON) 10 MEQ tablet Take 10 mEq by mouth daily.    Marland Kitchen PREMARIN vaginal cream PLACE 5.10 (2/5EN) APPLICATORFUL VAGINALLY 2 (TWO) TIMES A WEEK. (Patient taking differently: Place 2.77 Applicatorfuls vaginally every Monday. ) 30 g 12  . simvastatin (ZOCOR) 80 MG tablet Take 80 mg by mouth daily.    . Tetrahydrozoline HCl (VISINE OP) Place 1 drop into both eyes daily as needed (dry eyes).    . traZODone (DESYREL) 100 MG tablet Take 50 mg by mouth at bedtime as needed for sleep.   0  . VIIBRYD 20 MG TABS 10 mg for 7 days then increase to 20mg .     No current facility-administered medications for this visit.      PHYSICAL EXAMINATION: ECOG PERFORMANCE STATUS: 1 - Symptomatic but completely ambulatory Vitals:   03/26/19 1451  BP: (!) 156/75  Pulse: 65  Temp: 99.3 F (37.4 C)   Filed Weights   03/26/19 1451  Weight: 161 lb 5 oz (73.2 kg)    Physical Exam  Constitutional: She is oriented to person, place, and time and well-developed, well-nourished, and in no distress. No distress.  HENT:  Head: Normocephalic and atraumatic.  Nose: Nose normal.  Mouth/Throat: Oropharynx is clear and  moist. No  oropharyngeal exudate.  Eyes: Pupils are equal, round, and reactive to light. Conjunctivae and EOM are normal. Left eye exhibits no discharge. No scleral icterus.  Neck: Normal range of motion. Neck supple. No JVD present.  Cardiovascular: Normal rate and regular rhythm.  No murmur heard. Pulmonary/Chest: Effort normal. No respiratory distress. She has no rales. She exhibits no tenderness.  Decreased breath sound bilaterally  Abdominal: Soft. Bowel sounds are normal. She exhibits no distension. There is no abdominal tenderness.  Musculoskeletal: Normal range of motion.        General: No edema.  Lymphadenopathy:    She has no cervical adenopathy.  Neurological: She is alert and oriented to person, place, and time. No cranial nerve deficit. She exhibits normal muscle tone. Coordination normal.  Skin: Skin is warm and dry. She is not diaphoretic. No erythema.  Psychiatric: Affect and judgment normal.     LABORATORY DATA:  I have reviewed the data as listed Lab Results  Component Value Date   WBC 11.4 (H) 03/26/2019   HGB 12.7 03/26/2019   HCT 38.3 03/26/2019   MCV 93.6 03/26/2019   PLT 174 03/26/2019   Recent Labs    08/22/18 0952 11/23/18 0358 02/11/19 1352 03/26/19 1415  NA 138 139 136 139  K 3.9 3.5 4.1 4.0  CL 101 104 103 105  CO2 30 28 25 27   GLUCOSE 95 108* 121* 130*  BUN 14 12 18 8   CREATININE 1.01* 1.26* 1.20* 1.02*  CALCIUM 9.9 10.0 10.2 9.9  GFRNONAA 55* 42* 44* 54*  GFRAA >60 49* 52* >60  PROT 7.2  --  7.5 7.2  ALBUMIN 3.9  --  4.2 4.3  AST 14*  --  15 15  ALT 11  --  16 15  ALKPHOS 86  --  67 55  BILITOT 0.6  --  0.4 0.5    Serum Osmolarity 266, urine Osm 374, urine sodium 21.    ASSESSMENT & PLAN:  1. CLL (chronic lymphocytic leukemia) ([place])   2. Weight loss   3. Tobacco abuse   Labs are reviewed and discussed with patient. Leukocytosis has trended down. Continue watchful waiting.  Clinically doing well.  Weight loss has resolved. Tobacco  abuse, smoke cessation discussed with patient.  She is not interested at this point.   All questions were answered. The patient knows to call the clinic with any problems questions or concerns. Orders Placed This Encounter  Procedures  . Comprehensive metabolic panel    Standing Status:   Future    Standing Expiration Date:   03/25/2020  . CBC with Differential/Platelet    Standing Status:   Future    Standing Expiration Date:   03/25/2020    Return of visit  3 months Earlie Server, MD, PhD Hematology Oncology [place] at [facility] 03/27/2019

## 2019-05-12 ENCOUNTER — Other Ambulatory Visit: Payer: Medicare Other

## 2019-05-12 ENCOUNTER — Ambulatory Visit: Payer: Medicare Other | Admitting: Oncology

## 2019-05-17 ENCOUNTER — Other Ambulatory Visit: Payer: Self-pay

## 2019-05-17 DIAGNOSIS — Z5321 Procedure and treatment not carried out due to patient leaving prior to being seen by health care provider: Secondary | ICD-10-CM | POA: Insufficient documentation

## 2019-05-17 DIAGNOSIS — K0889 Other specified disorders of teeth and supporting structures: Secondary | ICD-10-CM | POA: Diagnosis present

## 2019-05-17 NOTE — ED Triage Notes (Signed)
Patient reports having 9 teeth pulled on 10/20.  Reports increased pain and bruising to lower jaw.  Patient takes Plavix.

## 2019-05-18 ENCOUNTER — Emergency Department
Admission: EM | Admit: 2019-05-18 | Discharge: 2019-05-18 | Disposition: A | Discharge status: Home / Self Care | Payer: Medicare Other | Attending: Emergency Medicine | Admitting: Emergency Medicine

## 2019-05-18 LAB — COMPREHENSIVE METABOLIC PANEL
ALT: 19 U/L (ref 0–44)
AST: 21 U/L (ref 15–41)
Albumin: 4.6 g/dL (ref 3.5–5.0)
Alkaline Phosphatase: 61 U/L (ref 38–126)
Anion gap: 9 (ref 5–15)
BUN: 10 mg/dL (ref 8–23)
CO2: 27 mmol/L (ref 22–32)
Calcium: 10 mg/dL (ref 8.9–10.3)
Chloride: 101 mmol/L (ref 98–111)
Creatinine, Ser: 1.13 mg/dL — ABNORMAL HIGH (ref 0.44–1.00)
GFR calc Af Amer: 55 mL/min — ABNORMAL LOW (ref >60)
GFR calc non Af Amer: 48 mL/min — ABNORMAL LOW (ref >60)
Glucose, Bld: 109 mg/dL — ABNORMAL HIGH (ref 70–99)
Potassium: 3.5 mmol/L (ref 3.5–5.1)
Sodium: 137 mmol/L (ref 135–145)
Total Bilirubin: 0.5 mg/dL (ref 0.3–1.2)
Total Protein: 7.9 g/dL (ref 6.5–8.1)

## 2019-05-18 LAB — CBC WITH DIFFERENTIAL/PLATELET
Abs Immature Granulocytes: 0.04 10*3/uL (ref 0.00–0.07)
Basophils Absolute: 0.1 10*3/uL (ref 0.0–0.1)
Basophils Relative: 0 %
Eosinophils Absolute: 0.2 10*3/uL (ref 0.0–0.5)
Eosinophils Relative: 1 %
HCT: 36 % (ref 36.0–46.0)
Hemoglobin: 11.6 g/dL — ABNORMAL LOW (ref 12.0–15.0)
Immature Granulocytes: 0 %
Lymphocytes Relative: 66 %
Lymphs Abs: 11.6 10*3/uL — ABNORMAL HIGH (ref 0.7–4.0)
MCH: 30.9 pg (ref 26.0–34.0)
MCHC: 32.2 g/dL (ref 30.0–36.0)
MCV: 96 fL (ref 80.0–100.0)
Monocytes Absolute: 1.1 10*3/uL — ABNORMAL HIGH (ref 0.1–1.0)
Monocytes Relative: 6 %
Neutro Abs: 4.8 10*3/uL (ref 1.7–7.7)
Neutrophils Relative %: 27 %
Platelets: 242 10*3/uL (ref 150–400)
RBC: 3.75 MIL/uL — ABNORMAL LOW (ref 3.87–5.11)
RDW: 13 % (ref 11.5–15.5)
WBC Morphology: ABNORMAL
WBC: 17.9 10*3/uL — ABNORMAL HIGH (ref 4.0–10.5)
nRBC: 0 % (ref 0.0–0.2)

## 2019-05-18 NOTE — ED Notes (Signed)
Pt called for third time

## 2019-05-18 NOTE — ED Notes (Signed)
Pt called x 2

## 2019-05-19 ENCOUNTER — Telehealth: Payer: Self-pay | Admitting: Emergency Medicine

## 2019-05-19 LAB — PATHOLOGIST SMEAR REVIEW

## 2019-05-19 NOTE — Telephone Encounter (Addendum)
Called patient due to lwot to inquire about condition and follow up plans. Left message.  pateint called me back.  Says she is feeling better in her mouth.  Does not feel like she is sick all over. ( told her that wbc was up.)  I told her to return as needed if she starts feeling worse.  She says she has appt with her pcp tomorrow.  I told her to have her pcp check her labs and check her mouth.

## 2019-06-24 ENCOUNTER — Inpatient Hospital Stay: Payer: Medicare Other | Attending: Oncology

## 2019-06-24 ENCOUNTER — Other Ambulatory Visit: Payer: Self-pay

## 2019-06-24 DIAGNOSIS — C9110 Chronic lymphocytic leukemia of B-cell type not having achieved remission: Secondary | ICD-10-CM | POA: Insufficient documentation

## 2019-06-24 DIAGNOSIS — I10 Essential (primary) hypertension: Secondary | ICD-10-CM | POA: Diagnosis not present

## 2019-06-24 DIAGNOSIS — I2510 Atherosclerotic heart disease of native coronary artery without angina pectoris: Secondary | ICD-10-CM | POA: Insufficient documentation

## 2019-06-24 DIAGNOSIS — F17210 Nicotine dependence, cigarettes, uncomplicated: Secondary | ICD-10-CM | POA: Insufficient documentation

## 2019-06-24 DIAGNOSIS — R634 Abnormal weight loss: Secondary | ICD-10-CM

## 2019-06-24 DIAGNOSIS — Z79899 Other long term (current) drug therapy: Secondary | ICD-10-CM | POA: Diagnosis not present

## 2019-06-24 DIAGNOSIS — K760 Fatty (change of) liver, not elsewhere classified: Secondary | ICD-10-CM | POA: Insufficient documentation

## 2019-06-24 DIAGNOSIS — I252 Old myocardial infarction: Secondary | ICD-10-CM | POA: Diagnosis not present

## 2019-06-24 DIAGNOSIS — Z720 Tobacco use: Secondary | ICD-10-CM

## 2019-06-24 LAB — CBC WITH DIFFERENTIAL/PLATELET
Abs Immature Granulocytes: 0.02 10*3/uL (ref 0.00–0.07)
Basophils Absolute: 0.1 10*3/uL (ref 0.0–0.1)
Basophils Relative: 0 %
Eosinophils Absolute: 0.2 10*3/uL (ref 0.0–0.5)
Eosinophils Relative: 2 %
HCT: 40.4 % (ref 36.0–46.0)
Hemoglobin: 12.8 g/dL (ref 12.0–15.0)
Immature Granulocytes: 0 %
Lymphocytes Relative: 70 %
Lymphs Abs: 11.3 10*3/uL — ABNORMAL HIGH (ref 0.7–4.0)
MCH: 30.5 pg (ref 26.0–34.0)
MCHC: 31.7 g/dL (ref 30.0–36.0)
MCV: 96.4 fL (ref 80.0–100.0)
Monocytes Absolute: 1 10*3/uL (ref 0.1–1.0)
Monocytes Relative: 6 %
Neutro Abs: 3.6 10*3/uL (ref 1.7–7.7)
Neutrophils Relative %: 22 %
Platelets: 234 10*3/uL (ref 150–400)
RBC: 4.19 MIL/uL (ref 3.87–5.11)
RDW: 12.7 % (ref 11.5–15.5)
Smear Review: NORMAL
WBC Morphology: ABNORMAL
WBC: 16.2 10*3/uL — ABNORMAL HIGH (ref 4.0–10.5)
nRBC: 0 % (ref 0.0–0.2)

## 2019-06-24 LAB — COMPREHENSIVE METABOLIC PANEL
ALT: 20 U/L (ref 0–44)
AST: 18 U/L (ref 15–41)
Albumin: 4.5 g/dL (ref 3.5–5.0)
Alkaline Phosphatase: 68 U/L (ref 38–126)
Anion gap: 7 (ref 5–15)
BUN: 16 mg/dL (ref 8–23)
CO2: 29 mmol/L (ref 22–32)
Calcium: 10.1 mg/dL (ref 8.9–10.3)
Chloride: 100 mmol/L (ref 98–111)
Creatinine, Ser: 1.19 mg/dL — ABNORMAL HIGH (ref 0.44–1.00)
GFR calc Af Amer: 52 mL/min — ABNORMAL LOW (ref >60)
GFR calc non Af Amer: 45 mL/min — ABNORMAL LOW (ref >60)
Glucose, Bld: 108 mg/dL — ABNORMAL HIGH (ref 70–99)
Potassium: 4 mmol/L (ref 3.5–5.1)
Sodium: 136 mmol/L (ref 135–145)
Total Bilirubin: 0.4 mg/dL (ref 0.3–1.2)
Total Protein: 7.8 g/dL (ref 6.5–8.1)

## 2019-06-24 LAB — LACTATE DEHYDROGENASE: LDH: 134 U/L (ref 98–192)

## 2019-06-25 ENCOUNTER — Inpatient Hospital Stay: Payer: Medicare Other

## 2019-06-25 ENCOUNTER — Telehealth: Payer: Self-pay

## 2019-06-25 ENCOUNTER — Inpatient Hospital Stay (HOSPITAL_BASED_OUTPATIENT_CLINIC_OR_DEPARTMENT_OTHER): Payer: Medicare Other | Admitting: Oncology

## 2019-06-25 DIAGNOSIS — C9110 Chronic lymphocytic leukemia of B-cell type not having achieved remission: Secondary | ICD-10-CM | POA: Diagnosis not present

## 2019-06-25 DIAGNOSIS — Z720 Tobacco use: Secondary | ICD-10-CM

## 2019-06-25 DIAGNOSIS — K760 Fatty (change of) liver, not elsewhere classified: Secondary | ICD-10-CM | POA: Diagnosis not present

## 2019-06-25 NOTE — Telephone Encounter (Signed)
Made multiple attempts to reach patient to check her in for virtual visit today with Dr. Tasia Catchings, unable to contact patient. Left message.

## 2019-06-25 NOTE — Progress Notes (Signed)
Unable to contact patient for nursing assessment after multiple attempts.  Dr. Tasia Catchings was able to talk with the patient for virtual visit.

## 2019-06-27 ENCOUNTER — Encounter: Payer: Self-pay | Admitting: Oncology

## 2019-06-27 NOTE — Progress Notes (Signed)
HEMATOLOGY-ONCOLOGY TeleHEALTH VISIT PROGRESS NOTE  I connected with Karen [name] on 06/27/19 at  2:15 PM EST by video enabled telemedicine visit and verified that I am speaking with the correct person using two identifiers. I discussed the limitations, risks, security and privacy concerns of performing an evaluation and management service by telemedicine and the availability of in-person appointments. I also discussed with the patient that there may be a patient responsible charge related to this service. The patient expressed understanding and agreed to proceed.   Other persons participating in the visit and their role in the encounter:  None  Patient's location: Home  Provider's location: office Chief Complaint: follow up for Karen [name] is a 73 y.o. female who has above history reviewed by me today presents for follow up visit for management of CLL Problems and complaints are listed below:  Patient reports feeling well.  No new complaints.  Denies weight loss, fever, chills, fatigue, night sweats.    Review of Systems  Constitutional: Negative for appetite change, chills, fatigue and fever.  HENT:   Negative for hearing loss and voice change.   Eyes: Negative for eye problems.  Respiratory: Negative for chest tightness and cough.   Cardiovascular: Negative for chest pain.  Gastrointestinal: Negative for abdominal distention, abdominal pain and blood in stool.  Endocrine: Negative for hot flashes.  Genitourinary: Negative for difficulty urinating and frequency.   Musculoskeletal: Negative for arthralgias.  Skin: Negative for itching and rash.  Neurological: Negative for extremity weakness.  Hematological: Negative for adenopathy.  Psychiatric/Behavioral: Negative for confusion.    Past Medical History:  Diagnosis Date  . Anxiety   . Artery occlusion   . Asthma   . Back pain   . Bronchitis   . Cancer ([place])    Skin Cancer  . CLL (chronic  lymphocytic leukemia) ([place])   . Coronary artery disease   . Depression   . Dyspnea   . GERD (gastroesophageal reflux disease)   . History of kidney stones   . Hypercholesteremia   . Hyperlipemia   . Hypertension   . Hypothyroidism   . Myocardial infarction ([place]) 1999  . Neuropathy   . Restless leg syndrome   . Sleep apnea    use C-PAP  . Wears dentures    full upper, partial lower.  doesn't currently wear   Past Surgical History:  Procedure Laterality Date  . COLONOSCOPY WITH PROPOFOL N/A 11/20/2016   Procedure: COLONOSCOPY WITH PROPOFOL;  Surgeon: Lucilla Lame, MD;  Location: [place];  Service: Endoscopy;  Laterality: N/A;  sleep apnea  . CORONARY ANGIOPLASTY WITH STENT PLACEMENT  2008  . DILATION AND CURETTAGE OF UTERUS    . EYE SURGERY Bilateral    Cataract Extraction with IOL  . HAND SURGERY Left 12/2017   upper hand fracture repair  . LEFT HEART CATH AND CORONARY ANGIOGRAPHY N/A 06/29/2017   Procedure: LEFT HEART CATH AND CORONARY ANGIOGRAPHY;  Surgeon: Dionisio David, MD;  Location: [place] [facility];  Service: Cardiovascular;  Laterality: N/A;  . LEFT HEART CATH AND CORONARY ANGIOGRAPHY Left 12/02/2018   Procedure: LEFT HEART CATH AND CORONARY ANGIOGRAPHY;  Surgeon: Dionisio David, MD;  Location: [place] [facility];  Service: Cardiovascular;  Laterality: Left;  . OPEN REDUCTION INTERNAL FIXATION (ORIF) DISTAL RADIAL FRACTURE Right 08/12/2018   Procedure: OPEN REDUCTION INTERNAL FIXATION (ORIF) DISTAL RADIAL FRACTURE;  Surgeon: [name], MD;  Location: [facility];  Service: Orthopedics;  Laterality: Right;  .  POLYPECTOMY  11/20/2016   Procedure: POLYPECTOMY;  Surgeon: Lucilla Lame, MD;  Location: [place];  Service: Endoscopy;;  . TUBAL LIGATION    . VAGINAL HYSTERECTOMY    . VULVECTOMY Right 01/31/2016   Procedure: WIDE EXCISION VULVECTOMY-RIGHT LABIA;  Surgeon: Brayton Mars, MD;  Location: [facility];  Service: Gynecology;  Laterality:  Right;    Family History  Problem Relation Age of Onset  . Heart disease Father   . Stroke Mother   . Hypertension Mother   . Lung cancer Brother   . Heart attack Brother   . Congestive Heart Failure Sister   . Cervical cancer Sister   . Cancer Neg Hx   . Diabetes Neg Hx   . Breast cancer Neg Hx     Social History   Socioeconomic History  . Marital status: Single    Spouse name: Not on file  . Number of children: 5  . Years of education: Not on file  . Highest education level: Not on file  Occupational History  . Occupation: part time job    Comment: [place] [employer]  . Financial resource strain: Not very hard  . Food insecurity    Worry: Never true    Inability: Never true  . Transportation needs    Medical: No    Non-medical: No  Tobacco Use  . Smoking status: Current Every Day Smoker    Packs/day: 0.50    Years: 40.00    Pack years: 20.00    Types: Cigarettes  . Smokeless tobacco: Current User    Types: Snuff  Substance and Sexual Activity  . Alcohol use: No    Alcohol/week: 0.0 standard drinks  . Drug use: No  . Sexual activity: Not Currently    Partners: Male    Birth control/protection: Surgical  Lifestyle  . Physical activity    Days per week: 0 days    Minutes per session: 0 min  . Stress: Only a little  Relationships  . Social connections    Talks on phone: More than three times a week    Gets together: Not on file    Attends religious service: Not on file    Active member of club or organization: Not on file    Attends meetings of clubs or organizations: Not on file    Relationship status: Not on file  . Intimate partner violence    Fear of current or ex partner: No    Emotionally abused: No    Physically abused: No    Forced sexual activity: No  Other Topics Concern  . Not on file  Social History Narrative  . Not on file    Current Outpatient Medications on File Prior to Visit  Medication Sig Dispense Refill  .  acetaminophen (TYLENOL) 500 MG tablet Take 1,000 mg by mouth every 6 (six) hours as needed for moderate pain or headache.    . albuterol (PROVENTIL) (2.5 MG/3ML) 0.083% nebulizer solution Take 3 mLs (2.5 mg total) by nebulization every 4 (four) hours as needed for wheezing or shortness of breath. 75 mL 0  . alendronate (FOSAMAX) 70 MG tablet Take 70 mg by mouth every Wednesday.     Marland Kitchen amiodarone (PACERONE) 200 MG tablet Take 200 mg by mouth daily.    Marland Kitchen aspirin EC 81 MG tablet Take 81 mg by mouth daily.    . budesonide-formoterol (SYMBICORT) 160-4.5 MCG/ACT inhaler Inhale 2 puffs into the lungs 2 (two) times daily as  needed (shortness of breath).    . cholecalciferol (VITAMIN D3) 25 MCG (1000 UT) tablet Take 1,000 Units by mouth daily.    . clopidogrel (PLAVIX) 75 MG tablet Take 75 mg by mouth daily.     Marland Kitchen dexlansoprazole (DEXILANT) 60 MG capsule Take 60 mg by mouth daily.     . DULoxetine (CYMBALTA) 60 MG capsule Take 60 mg by mouth daily.    . fluticasone (FLONASE) 50 MCG/ACT nasal spray Place 1 spray into both nostrils daily as needed for allergies or rhinitis.    . furosemide (LASIX) 20 MG tablet Take 20 mg by mouth daily.  3  . gabapentin (NEURONTIN) 400 MG capsule Take 400 mg by mouth 2 (two) times daily.     . hydrALAZINE (APRESOLINE) 25 MG tablet Take 25 mg by mouth daily.    Marland Kitchen HYDROcodone-acetaminophen (NORCO) 7.5-325 MG tablet Take 1-2 tablets by mouth every 6 (six) hours as needed for moderate pain. 30 tablet 0  . isosorbide mononitrate (IMDUR) 60 MG 24 hr tablet Take 60 mg by mouth daily.    Marland Kitchen lisinopril (PRINIVIL,ZESTRIL) 40 MG tablet Take 40 mg by mouth daily.    . metoprolol succinate (TOPROL-XL) 50 MG 24 hr tablet Take 25 mg by mouth daily.     . Nebulizers (COMPRESSOR/NEBULIZER) MISC 1 Units by Does not apply route every 4 (four) hours as needed. 1 each 0  . nitroGLYCERIN (NITROSTAT) 0.4 MG SL tablet Place 0.4 mg under the tongue every 5 (five) minutes as needed for chest pain.      . potassium chloride (K-DUR,KLOR-CON) 10 MEQ tablet Take 10 mEq by mouth daily.    Marland Kitchen PREMARIN vaginal cream PLACE 4.33 (2/9JJ) APPLICATORFUL VAGINALLY 2 (TWO) TIMES A WEEK. (Patient taking differently: Place 8.84 Applicatorfuls vaginally every Monday. ) 30 g 12  . simvastatin (ZOCOR) 80 MG tablet Take 80 mg by mouth daily.    . Tetrahydrozoline HCl (VISINE OP) Place 1 drop into both eyes daily as needed (dry eyes).    . traZODone (DESYREL) 100 MG tablet Take 50 mg by mouth at bedtime as needed for sleep.   0  . VIIBRYD 20 MG TABS 10 mg for 7 days then increase to 20mg .     No current facility-administered medications on file prior to visit.     Allergies  Allergen Reactions  . Requip [Ropinirole Hcl] Anaphylaxis    Throat closing  . Celebrex [Celecoxib] Itching  . Meloxicam Palpitations  . Nitrofurantoin Palpitations       Observations/Objective: There were no vitals filed for this visit. There is no height or weight on file to calculate BMI.  Physical Exam  Constitutional: She is oriented to person, place, and time. No distress.  Neurological: She is alert and oriented to person, place, and time.  Psychiatric: Mood normal.    CBC    Component Value Date/Time   WBC 16.2 (H) 06/24/2019 1452   RBC 4.19 06/24/2019 1452   HGB 12.8 06/24/2019 1452   HCT 40.4 06/24/2019 1452   PLT 234 06/24/2019 1452   MCV 96.4 06/24/2019 1452   MCH 30.5 06/24/2019 1452   MCHC 31.7 06/24/2019 1452   RDW 12.7 06/24/2019 1452   LYMPHSABS 11.3 (H) 06/24/2019 1452   MONOABS 1.0 06/24/2019 1452   EOSABS 0.2 06/24/2019 1452   BASOSABS 0.1 06/24/2019 1452    CMP     Component Value Date/Time   NA 136 06/24/2019 1452   K 4.0 06/24/2019 1452   CL 100  06/24/2019 1452   CO2 29 06/24/2019 1452   GLUCOSE 108 (H) 06/24/2019 1452   BUN 16 06/24/2019 1452   CREATININE 1.19 (H) 06/24/2019 1452   CALCIUM 10.1 06/24/2019 1452   PROT 7.8 06/24/2019 1452   ALBUMIN 4.5 06/24/2019 1452   AST 18  06/24/2019 1452   ALT 20 06/24/2019 1452   ALKPHOS 68 06/24/2019 1452   BILITOT 0.4 06/24/2019 1452   GFRNONAA 45 (L) 06/24/2019 1452   GFRAA 52 (L) 06/24/2019 1452     Assessment and Plan: 1. CLL (chronic lymphocytic leukemia) ([place])   2. Tobacco abuse   3. Fatty liver   CLL, stage 0 Labs are reviewed and discussed with patient. Clinically she is doing well.  Leukocytosis is stable, continue watchful waiting.  Smoking cessation discussed. She is not interested. Need CT lung cancer screening.  Fatty liver disease, normal LFT.   Follow Up Instructions: 3 months.    I discussed the assessment and treatment plan with the patient. The patient was provided an opportunity to ask questions and all were answered. The patient agreed with the plan and demonstrated an understanding of the instructions.  The patient was advised to call back or seek an in-person evaluation if the symptoms worsen or if the condition fails to improve as anticipated.  Earlie Server, MD 06/27/2019 8:37 PM

## 2019-09-09 ENCOUNTER — Encounter: Payer: Medicaid Other | Admitting: Obstetrics and Gynecology

## 2019-09-15 ENCOUNTER — Ambulatory Visit: Payer: Medicare Other | Attending: Internal Medicine

## 2019-09-15 DIAGNOSIS — Z20822 Contact with and (suspected) exposure to covid-19: Secondary | ICD-10-CM

## 2019-09-16 LAB — NOVEL CORONAVIRUS, NAA: SARS-CoV-2, NAA: NOT DETECTED

## 2019-09-17 ENCOUNTER — Telehealth: Payer: Self-pay | Admitting: General Practice

## 2019-09-17 NOTE — Telephone Encounter (Signed)
Negative COVID results given. Patient results "NOT Detected." Caller expressed understanding. ° °

## 2019-09-22 ENCOUNTER — Other Ambulatory Visit: Payer: Self-pay

## 2019-09-22 ENCOUNTER — Inpatient Hospital Stay: Payer: Medicare Other | Attending: Oncology

## 2019-09-22 DIAGNOSIS — Z79899 Other long term (current) drug therapy: Secondary | ICD-10-CM | POA: Diagnosis not present

## 2019-09-22 DIAGNOSIS — E039 Hypothyroidism, unspecified: Secondary | ICD-10-CM | POA: Diagnosis not present

## 2019-09-22 DIAGNOSIS — C9110 Chronic lymphocytic leukemia of B-cell type not having achieved remission: Secondary | ICD-10-CM

## 2019-09-22 DIAGNOSIS — I10 Essential (primary) hypertension: Secondary | ICD-10-CM | POA: Insufficient documentation

## 2019-09-22 DIAGNOSIS — F17210 Nicotine dependence, cigarettes, uncomplicated: Secondary | ICD-10-CM | POA: Insufficient documentation

## 2019-09-22 LAB — CBC WITH DIFFERENTIAL/PLATELET
Abs Immature Granulocytes: 0.02 10*3/uL (ref 0.00–0.07)
Basophils Absolute: 0.1 10*3/uL (ref 0.0–0.1)
Basophils Relative: 0 %
Eosinophils Absolute: 0.6 10*3/uL — ABNORMAL HIGH (ref 0.0–0.5)
Eosinophils Relative: 4 %
HCT: 40.5 % (ref 36.0–46.0)
Hemoglobin: 12.8 g/dL (ref 12.0–15.0)
Immature Granulocytes: 0 %
Lymphocytes Relative: 72 %
Lymphs Abs: 11.7 10*3/uL — ABNORMAL HIGH (ref 0.7–4.0)
MCH: 29.6 pg (ref 26.0–34.0)
MCHC: 31.6 g/dL (ref 30.0–36.0)
MCV: 93.8 fL (ref 80.0–100.0)
Monocytes Absolute: 0.7 10*3/uL (ref 0.1–1.0)
Monocytes Relative: 4 %
Neutro Abs: 3.3 10*3/uL (ref 1.7–7.7)
Neutrophils Relative %: 20 %
Platelets: 197 10*3/uL (ref 150–400)
RBC: 4.32 MIL/uL (ref 3.87–5.11)
RDW: 13.3 % (ref 11.5–15.5)
WBC: 16.4 10*3/uL — ABNORMAL HIGH (ref 4.0–10.5)
nRBC: 0 % (ref 0.0–0.2)

## 2019-09-22 LAB — COMPREHENSIVE METABOLIC PANEL
ALT: 12 U/L (ref 0–44)
AST: 16 U/L (ref 15–41)
Albumin: 4.2 g/dL (ref 3.5–5.0)
Alkaline Phosphatase: 62 U/L (ref 38–126)
Anion gap: 10 (ref 5–15)
BUN: 10 mg/dL (ref 8–23)
CO2: 25 mmol/L (ref 22–32)
Calcium: 9.9 mg/dL (ref 8.9–10.3)
Chloride: 102 mmol/L (ref 98–111)
Creatinine, Ser: 1.07 mg/dL — ABNORMAL HIGH (ref 0.44–1.00)
GFR calc Af Amer: 59 mL/min — ABNORMAL LOW (ref >60)
GFR calc non Af Amer: 51 mL/min — ABNORMAL LOW (ref >60)
Glucose, Bld: 98 mg/dL (ref 70–99)
Potassium: 3.2 mmol/L — ABNORMAL LOW (ref 3.5–5.1)
Sodium: 137 mmol/L (ref 135–145)
Total Bilirubin: 0.3 mg/dL (ref 0.3–1.2)
Total Protein: 7.4 g/dL (ref 6.5–8.1)

## 2019-09-23 ENCOUNTER — Inpatient Hospital Stay (HOSPITAL_BASED_OUTPATIENT_CLINIC_OR_DEPARTMENT_OTHER): Payer: Medicare Other | Admitting: Oncology

## 2019-09-23 ENCOUNTER — Encounter: Payer: Self-pay | Admitting: Oncology

## 2019-09-23 DIAGNOSIS — C9110 Chronic lymphocytic leukemia of B-cell type not having achieved remission: Secondary | ICD-10-CM

## 2019-09-23 DIAGNOSIS — Z720 Tobacco use: Secondary | ICD-10-CM | POA: Diagnosis not present

## 2019-09-23 NOTE — Progress Notes (Signed)
Patient verified using two identifiers for virtual visit via telephone today.  Patient does not offer any problems today.  

## 2019-09-25 NOTE — Progress Notes (Signed)
HEMATOLOGY-ONCOLOGY TeleHEALTH VISIT PROGRESS NOTE  I connected with Karen [name] on 09/25/19 at  2:45 PM EST by video enabled telemedicine visit and verified that I am speaking with the correct person using two identifiers. I discussed the limitations, risks, security and privacy concerns of performing an evaluation and management service by telemedicine and the availability of in-person appointments. I also discussed with the patient that there may be a patient responsible charge related to this service. The patient expressed understanding and agreed to proceed.   Other persons participating in the visit and their role in the encounter:  None  Patient's location: Home  Provider's location: office Chief Complaint: Follow-up for CLL   INTERVAL HISTORY Karen [name] is a 74 y.o. female who has above history reviewed by me today presents for follow up visit for management of CLL Problems and complaints are listed below:  Patient reports feeling well.  Denies any new complaints. She denies weight loss, fever, chills, fatigue, night sweats. She continues to smoke daily. Review of Systems  Constitutional: Negative for appetite change, chills, fatigue and fever.  HENT:   Negative for hearing loss and voice change.   Eyes: Negative for eye problems.  Respiratory: Negative for chest tightness and cough.   Cardiovascular: Negative for chest pain.  Gastrointestinal: Negative for abdominal distention, abdominal pain and blood in stool.  Endocrine: Negative for hot flashes.  Genitourinary: Negative for difficulty urinating and frequency.   Musculoskeletal: Negative for arthralgias.  Skin: Negative for itching and rash.  Neurological: Negative for extremity weakness.  Hematological: Negative for adenopathy.  Psychiatric/Behavioral: Negative for confusion.    Past Medical History:  Diagnosis Date  . Anxiety   . Artery occlusion   . Asthma   . Back pain   . Bronchitis   . Cancer ([place])     Skin Cancer  . CLL (chronic lymphocytic leukemia) ([place])   . Coronary artery disease   . Depression   . Dyspnea   . GERD (gastroesophageal reflux disease)   . History of kidney stones   . Hypercholesteremia   . Hyperlipemia   . Hypertension   . Hypothyroidism   . Myocardial infarction ([place]) 1999  . Neuropathy   . Restless leg syndrome   . Sleep apnea    use C-PAP  . Wears dentures    full upper, partial lower.  doesn't currently wear   Past Surgical History:  Procedure Laterality Date  . COLONOSCOPY WITH PROPOFOL N/A 11/20/2016   Procedure: COLONOSCOPY WITH PROPOFOL;  Surgeon: Lucilla Lame, MD;  Location: [facility];  Service: Endoscopy;  Laterality: N/A;  sleep apnea  . CORONARY ANGIOPLASTY WITH STENT PLACEMENT  2008  . DILATION AND CURETTAGE OF UTERUS    . EYE SURGERY Bilateral    Cataract Extraction with IOL  . HAND SURGERY Left 12/2017   upper hand fracture repair  . LEFT HEART CATH AND CORONARY ANGIOGRAPHY N/A 06/29/2017   Procedure: LEFT HEART CATH AND CORONARY ANGIOGRAPHY;  Surgeon: Dionisio David, MD;  Location: [facility];  Service: Cardiovascular;  Laterality: N/A;  . LEFT HEART CATH AND CORONARY ANGIOGRAPHY Left 12/02/2018   Procedure: LEFT HEART CATH AND CORONARY ANGIOGRAPHY;  Surgeon: Dionisio David, MD;  Location: [facility];  Service: Cardiovascular;  Laterality: Left;  . OPEN REDUCTION INTERNAL FIXATION (ORIF) DISTAL RADIAL FRACTURE Right 08/12/2018   Procedure: OPEN REDUCTION INTERNAL FIXATION (ORIF) DISTAL RADIAL FRACTURE;  Surgeon: [name], MD;  Location: [facility];  Service: Orthopedics;  Laterality: Right;  . POLYPECTOMY  11/20/2016   Procedure: POLYPECTOMY;  Surgeon: Lucilla Lame, MD;  Location: [facility];  Service: Endoscopy;;  . TUBAL LIGATION    . VAGINAL HYSTERECTOMY    . VULVECTOMY Right 01/31/2016   Procedure: WIDE EXCISION VULVECTOMY-RIGHT LABIA;  Surgeon: Brayton Mars, MD;  Location: [facility];   Service: Gynecology;  Laterality: Right;    Family History  Problem Relation Age of Onset  . Heart disease Father   . Stroke Mother   . Hypertension Mother   . Lung cancer Brother   . Heart attack Brother   . Congestive Heart Failure Sister   . Cervical cancer Sister   . Cancer Neg Hx   . Diabetes Neg Hx   . Breast cancer Neg Hx     Social History   Socioeconomic History  . Marital status: Single    Spouse name: Not on file  . Number of children: 5  . Years of education: Not on file  . Highest education level: Not on file  Occupational History  . Occupation: part time job    Comment: Engineer, building services  Tobacco Use  . Smoking status: Current Every Day Smoker    Packs/day: 0.50    Years: 40.00    Pack years: 20.00    Types: Cigarettes  . Smokeless tobacco: Current User    Types: Snuff  Substance and Sexual Activity  . Alcohol use: No    Alcohol/week: 0.0 standard drinks  . Drug use: No  . Sexual activity: Not Currently    Partners: Male    Birth control/protection: Surgical  Other Topics Concern  . Not on file  Social History Narrative  . Not on file   Social Determinants of Health   Financial Resource Strain: Low Risk   . Difficulty of Paying Living Expenses: Not very hard  Food Insecurity: No Food Insecurity  . Worried About Charity fundraiser in the Last Year: Never true  . Ran Out of Food in the Last Year: Never true  Transportation Needs: No Transportation Needs  . Lack of Transportation (Medical): No  . Lack of Transportation (Non-Medical): No  Physical Activity:   . Days of Exercise per Week: Not on file  . Minutes of Exercise per Session: Not on file  Stress: No Stress Concern Present  . Feeling of Stress : Only a little  Social Connections: Unknown  . Frequency of Communication with Friends and Family: More than three times a week  . Frequency of Social Gatherings with Friends and Family: Not on file  . Attends Religious Services: Not on file  .  Active Member of Clubs or Organizations: Not on file  . Attends Archivist Meetings: Not on file  . Marital Status: Not on file  Intimate Partner Violence: Not At Risk  . Fear of Current or Ex-Partner: No  . Emotionally Abused: No  . Physically Abused: No  . Sexually Abused: No    Current Outpatient Medications on File Prior to Visit  Medication Sig Dispense Refill  . acetaminophen (TYLENOL) 500 MG tablet Take 1,000 mg by mouth every 6 (six) hours as needed for moderate pain or headache.    . albuterol (PROVENTIL) (2.5 MG/3ML) 0.083% nebulizer solution Take 3 mLs (2.5 mg total) by nebulization every 4 (four) hours as needed for wheezing or shortness of breath. 75 mL 0  . alendronate (FOSAMAX) 70 MG tablet Take 70 mg by mouth every Wednesday.     Marland Kitchen  amiodarone (PACERONE) 200 MG tablet Take 200 mg by mouth daily.    Marland Kitchen aspirin EC 81 MG tablet Take 81 mg by mouth daily.    . budesonide-formoterol (SYMBICORT) 160-4.5 MCG/ACT inhaler Inhale 2 puffs into the lungs 2 (two) times daily as needed (shortness of breath).    . cholecalciferol (VITAMIN D3) 25 MCG (1000 UT) tablet Take 1,000 Units by mouth daily.    . clopidogrel (PLAVIX) 75 MG tablet Take 75 mg by mouth daily.     Marland Kitchen dexlansoprazole (DEXILANT) 60 MG capsule Take 60 mg by mouth daily.     . DULoxetine (CYMBALTA) 60 MG capsule Take 60 mg by mouth daily.    . fluticasone (FLONASE) 50 MCG/ACT nasal spray Place 1 spray into both nostrils daily as needed for allergies or rhinitis.    . furosemide (LASIX) 20 MG tablet Take 20 mg by mouth daily.  3  . gabapentin (NEURONTIN) 400 MG capsule Take 400 mg by mouth 2 (two) times daily.     . hydrALAZINE (APRESOLINE) 25 MG tablet Take 25 mg by mouth daily.    . isosorbide mononitrate (IMDUR) 60 MG 24 hr tablet Take 60 mg by mouth daily.    Marland Kitchen lisinopril (PRINIVIL,ZESTRIL) 40 MG tablet Take 40 mg by mouth daily.    . metoprolol succinate (TOPROL-XL) 50 MG 24 hr tablet Take 25 mg by mouth daily.      . Nebulizers (COMPRESSOR/NEBULIZER) MISC 1 Units by Does not apply route every 4 (four) hours as needed. 1 each 0  . nitroGLYCERIN (NITROSTAT) 0.4 MG SL tablet Place 0.4 mg under the tongue every 5 (five) minutes as needed for chest pain.     . potassium chloride (K-DUR,KLOR-CON) 10 MEQ tablet Take 10 mEq by mouth daily.    Marland Kitchen REPATHA 140 MG/ML SOSY USE AS DIRECTED, TWICE A MONTH    . Tetrahydrozoline HCl (VISINE OP) Place 1 drop into both eyes daily as needed (dry eyes).    Marland Kitchen VIIBRYD 20 MG TABS 10 mg for 7 days then increase to 20mg .    . HYDROcodone-acetaminophen (NORCO) 7.5-325 MG tablet Take 1-2 tablets by mouth every 6 (six) hours as needed for moderate pain. (Patient not taking: Reported on 09/23/2019) 30 tablet 0  . PREMARIN vaginal cream PLACE 2.70 (6/2BJ) APPLICATORFUL VAGINALLY 2 (TWO) TIMES A WEEK. (Patient not taking: No sig reported) 30 g 12  . simvastatin (ZOCOR) 80 MG tablet Take 80 mg by mouth daily.    . traZODone (DESYREL) 100 MG tablet Take 50 mg by mouth at bedtime as needed for sleep.   0   No current facility-administered medications on file prior to visit.    Allergies  Allergen Reactions  . Requip [Ropinirole Hcl] Anaphylaxis    Throat closing  . Celebrex [Celecoxib] Itching  . Meloxicam Palpitations  . Nitrofurantoin Palpitations       Observations/Objective: Today's Vitals   09/23/19 1448  PainSc: 0-No pain   There is no height or weight on file to calculate BMI.  Physical Exam  Constitutional: No distress.  Neurological: She is alert.    CBC    Component Value Date/Time   WBC 16.4 (H) 09/22/2019 1109   RBC 4.32 09/22/2019 1109   HGB 12.8 09/22/2019 1109   HCT 40.5 09/22/2019 1109   PLT 197 09/22/2019 1109   MCV 93.8 09/22/2019 1109   MCH 29.6 09/22/2019 1109   MCHC 31.6 09/22/2019 1109   RDW 13.3 09/22/2019 1109   LYMPHSABS 11.7 (H) 09/22/2019  1109   MONOABS 0.7 09/22/2019 1109   EOSABS 0.6 (H) 09/22/2019 1109   BASOSABS 0.1 09/22/2019 1109     CMP     Component Value Date/Time   NA 137 09/22/2019 1109   K 3.2 (L) 09/22/2019 1109   CL 102 09/22/2019 1109   CO2 25 09/22/2019 1109   GLUCOSE 98 09/22/2019 1109   BUN 10 09/22/2019 1109   CREATININE 1.07 (H) 09/22/2019 1109   CALCIUM 9.9 09/22/2019 1109   PROT 7.4 09/22/2019 1109   ALBUMIN 4.2 09/22/2019 1109   AST 16 09/22/2019 1109   ALT 12 09/22/2019 1109   ALKPHOS 62 09/22/2019 1109   BILITOT 0.3 09/22/2019 1109   GFRNONAA 51 (L) 09/22/2019 1109   GFRAA 59 (L) 09/22/2019 1109     Assessment and Plan: 1. CLL (chronic lymphocytic leukemia) ([place])   2. Tobacco abuse     CLL, stage 0.  Labs reviewed and discussed with patient. Clinically she is doing well.  No constitutional symptoms.  Continue watchful waiting. Smoke cessation was discussed with patient.  Patient is not interested.    Follow Up Instructions: 6 months.   I discussed the assessment and treatment plan with the patient. The patient was provided an opportunity to ask questions and all were answered. The patient agreed with the plan and demonstrated an understanding of the instructions.  The patient was advised to call back or seek an in-person evaluation if the symptoms worsen or if the condition fails to improve as anticipated.     Earlie Server, MD 09/25/2019 8:52 PM

## 2019-10-21 ENCOUNTER — Other Ambulatory Visit: Payer: Self-pay

## 2019-10-21 ENCOUNTER — Encounter: Payer: Self-pay | Admitting: Obstetrics and Gynecology

## 2019-10-21 ENCOUNTER — Ambulatory Visit (INDEPENDENT_AMBULATORY_CARE_PROVIDER_SITE_OTHER): Payer: Medicare Other | Admitting: Obstetrics and Gynecology

## 2019-10-21 VITALS — BP 168/73 | HR 69 | Ht 63.0 in | Wt 161.8 lb

## 2019-10-21 DIAGNOSIS — Z01419 Encounter for gynecological examination (general) (routine) without abnormal findings: Secondary | ICD-10-CM | POA: Diagnosis not present

## 2019-10-21 DIAGNOSIS — N952 Postmenopausal atrophic vaginitis: Secondary | ICD-10-CM | POA: Diagnosis not present

## 2019-10-21 DIAGNOSIS — N907 Vulvar cyst: Secondary | ICD-10-CM | POA: Diagnosis not present

## 2019-10-21 MED ORDER — ESTROGENS, CONJUGATED 0.625 MG/GM VA CREA: TOPICAL_CREAM | VAGINAL | 1 refills | Status: DC

## 2019-10-21 NOTE — Progress Notes (Signed)
HPI:      [name] is a 74 y.o. [id number] who LMP was No LMP recorded. Patient has had a hysterectomy.  Subjective:   She presents today for her annual examination.  She reports that she gets her lab work and mammogram through her primary care physician.  She states that she remains sexually active and her sex drive is decreasing since running out of estrogen cream approximately 1 year ago.  She would like to restart this. Patient has a history of lymphoma and she is being followed by the cancer center.  No current treatment. Patient reports recurrence of a labial cyst that she describes as similar to before when she had an excision performed.  She reports it is asymptomatic she simply knows it is there but it causes no issues.    Hx: The following portions of the patient's history were reviewed and updated as appropriate:             She  has a past medical history of Anxiety, Artery occlusion, Asthma, Back pain, Bronchitis, Cancer (H[facility]), CLL (chronic lymphocytic leukemia) ([place]), Coronary artery disease, Depression, Dyspnea, GERD (gastroesophageal reflux disease), History of kidney stones, Hypercholesteremia, Hyperlipemia, Hypertension, Hypothyroidism, Myocardial infarction ([place]) (1999), Neuropathy, Restless leg syndrome, Sleep apnea, and Wears dentures. She does not have any pertinent problems on file. She  has a past surgical history that includes Tubal ligation; Vaginal hysterectomy; Dilation and curettage of uterus; Eye surgery (Bilateral); Vulvectomy (Right, 01/31/2016); Colonoscopy with propofol (N/A, 11/20/2016); polypectomy (11/20/2016); LEFT HEART CATH AND CORONARY ANGIOGRAPHY (N/A, 06/29/2017); Coronary angioplasty with stent (2008); Hand surgery (Left, 12/2017); Open reduction internal fixation (orif) distal radial fracture (Right, 08/12/2018); and LEFT HEART CATH AND CORONARY ANGIOGRAPHY (Left, 12/02/2018). Her family history includes Cervical cancer in her sister; Congestive Heart  Failure in her sister; Heart attack in her brother; Heart disease in her father; Hypertension in her mother; Lung cancer in her brother; Stroke in her mother. She  reports that she has been smoking cigarettes. She has a 20.00 pack-year smoking history. Her smokeless tobacco use includes snuff. She reports that she does not drink alcohol or use drugs. She has a current medication list which includes the following prescription(s): acetaminophen, albuterol, alendronate, amiodarone, aspirin ec, budesonide-formoterol, cholecalciferol, clopidogrel, dexlansoprazole, duloxetine, fluticasone, furosemide, gabapentin, hydralazine, hydrocodone-acetaminophen, isosorbide mononitrate, lisinopril, metoprolol succinate, compressor/nebulizer, nitroglycerin, potassium chloride, premarin, repatha, simvastatin, tetrahydrozoline hcl, trazodone, and viibryd. She is allergic to requip [ropinirole hcl]; celebrex [celecoxib]; meloxicam; and nitrofurantoin.       Review of Systems:  Review of Systems  Constitutional: Denied constitutional symptoms, night sweats, recent illness, fatigue, fever, insomnia and weight loss.  Eyes: Denied eye symptoms, eye pain, photophobia, vision change and visual disturbance.  Ears/Nose/Throat/Neck: Denied ear, nose, throat or neck symptoms, hearing loss, nasal discharge, sinus congestion and sore throat.  Cardiovascular: Denied cardiovascular symptoms, arrhythmia, chest pain/pressure, edema, exercise intolerance, orthopnea and palpitations.  Respiratory: Denied pulmonary symptoms, asthma, pleuritic pain, productive sputum, cough, dyspnea and wheezing.  Gastrointestinal: Denied, gastro-esophageal reflux, melena, nausea and vomiting.  Genitourinary: See HPI for additional information.  Musculoskeletal: Denied musculoskeletal symptoms, stiffness, swelling, muscle weakness and myalgia.  Dermatologic: Denied dermatology symptoms, rash and scar.  Neurologic: Denied neurology symptoms, dizziness,  headache, neck pain and syncope.  Psychiatric: Denied psychiatric symptoms, anxiety and depression.  Endocrine: Denied endocrine symptoms including hot flashes and night sweats.   Meds:   Current Outpatient Medications on File Prior to Visit  Medication Sig Dispense Refill  . acetaminophen (TYLENOL) 500  MG tablet Take 1,000 mg by mouth every 6 (six) hours as needed for moderate pain or headache.    . albuterol (PROVENTIL) (2.5 MG/3ML) 0.083% nebulizer solution Take 3 mLs (2.5 mg total) by nebulization every 4 (four) hours as needed for wheezing or shortness of breath. 75 mL 0  . alendronate (FOSAMAX) 70 MG tablet Take 70 mg by mouth every Wednesday.     Marland Kitchen amiodarone (PACERONE) 200 MG tablet Take 200 mg by mouth daily.    Marland Kitchen aspirin EC 81 MG tablet Take 81 mg by mouth daily.    . budesonide-formoterol (SYMBICORT) 160-4.5 MCG/ACT inhaler Inhale 2 puffs into the lungs 2 (two) times daily as needed (shortness of breath).    . cholecalciferol (VITAMIN D3) 25 MCG (1000 UT) tablet Take 1,000 Units by mouth daily.    . clopidogrel (PLAVIX) 75 MG tablet Take 75 mg by mouth daily.     Marland Kitchen dexlansoprazole (DEXILANT) 60 MG capsule Take 60 mg by mouth daily.     . DULoxetine (CYMBALTA) 60 MG capsule Take 60 mg by mouth daily.    . fluticasone (FLONASE) 50 MCG/ACT nasal spray Place 1 spray into both nostrils daily as needed for allergies or rhinitis.    . furosemide (LASIX) 20 MG tablet Take 20 mg by mouth daily.  3  . gabapentin (NEURONTIN) 400 MG capsule Take 400 mg by mouth 2 (two) times daily.     . hydrALAZINE (APRESOLINE) 25 MG tablet Take 25 mg by mouth daily.    Marland Kitchen HYDROcodone-acetaminophen (NORCO) 7.5-325 MG tablet Take 1-2 tablets by mouth every 6 (six) hours as needed for moderate pain. 30 tablet 0  . isosorbide mononitrate (IMDUR) 60 MG 24 hr tablet Take 60 mg by mouth daily.    Marland Kitchen lisinopril (PRINIVIL,ZESTRIL) 40 MG tablet Take 40 mg by mouth daily.    . metoprolol succinate (TOPROL-XL) 50 MG 24  hr tablet Take 25 mg by mouth daily.     . Nebulizers (COMPRESSOR/NEBULIZER) MISC 1 Units by Does not apply route every 4 (four) hours as needed. 1 each 0  . nitroGLYCERIN (NITROSTAT) 0.4 MG SL tablet Place 0.4 mg under the tongue every 5 (five) minutes as needed for chest pain.     . potassium chloride (K-DUR,KLOR-CON) 10 MEQ tablet Take 10 mEq by mouth daily.    Marland Kitchen PREMARIN vaginal cream PLACE 0.35 (0/0XF) APPLICATORFUL VAGINALLY 2 (TWO) TIMES A WEEK. 30 g 12  . REPATHA 140 MG/ML SOSY USE AS DIRECTED, TWICE A MONTH    . simvastatin (ZOCOR) 80 MG tablet Take 80 mg by mouth daily.    . Tetrahydrozoline HCl (VISINE OP) Place 1 drop into both eyes daily as needed (dry eyes).    . traZODone (DESYREL) 100 MG tablet Take 50 mg by mouth at bedtime as needed for sleep.   0  . VIIBRYD 20 MG TABS 10 mg for 7 days then increase to 20mg .     No current facility-administered medications on file prior to visit.    Objective:     Vitals:   10/21/19 1359  BP: (!) 168/73  Pulse: 69              Physical examination General NAD, Conversant  HEENT Atraumatic; Op clear with mmm.  Normo-cephalic. Pupils reactive. Anicteric sclerae  Thyroid/Neck Smooth without nodularity or enlargement. Normal ROM.  Neck Supple.  Skin No rashes, lesions or ulceration. Normal palpated skin turgor. No nodularity.  Breasts: No masses or discharge.  Symmetric.  No axillary adenopathy.  Lungs: Clear to auscultation.No rales or wheezes. Normal Respiratory effort, no retractions.  Heart: NSR.  No murmurs or rubs appreciated. No periferal edema  Abdomen: Soft.  Non-tender.  No masses.  No HSM. No hernia  Extremities: Moves all appropriately.  Normal ROM for age. No lymphadenopathy.  Neuro: Oriented to PPT.  Normal mood. Normal affect.     Pelvic:   Vulva: Normal appearance.  No lesions.  Small 1 cm in diameter left labial cyst, very likely benign, possibly sebaceous.  Vagina: No lesions or abnormalities noted.  Moderate  vaginal atrophy  Support:  Second-degree cystocele second-degree rectocele  Urethra No masses tenderness or scarring.  Meatus Normal size without lesions or prolapse.  Cervix: Surgically absent   Anus: Normal exam.  No lesions.  Perineum: Normal exam.  No lesions.        Bimanual   Uterus: Surgically absent   Adnexae: No masses.  Non-tender to palpation.  Cul-de-sac: Negative for abnormality.     Assessment:    [id number] Patient Active Problem List   Diagnosis Date Noted  . CLL (chronic lymphocytic leukemia) ([place]) 12/18/2017  . Unstable angina ([place]) 06/22/2017  . Inclusion cyst of vulva 12/14/2016  . Lumbar spondylosis 08/14/2016  . Trochanteric bursitis of right hip 08/14/2016  . Herpes simplex virus (HSV) infection 12/09/2015  . History of total vaginal hysterectomy (TVH) 11/25/2015  . Vaginal odor 11/25/2015  . History of UTI 11/25/2015  . Vaginal atrophy 11/25/2015  . Dyspareunia in female 11/25/2015  . Tendinitis of wrist 05/26/2015     1. Well woman exam with routine gynecological exam   2. Vaginal atrophy   3. Labial cyst     Labial cyst likely benign-sebaceous-asymptomatic   Plan:            1.  Basic Screening Recommendations The basic screening recommendations for asymptomatic women were discussed with the patient during her visit.  The age-appropriate recommendations were discussed with her and the rational for the tests reviewed.  When I am informed by the patient that another primary care physician has previously obtained the age-appropriate tests and they are up-to-date, only outstanding tests are ordered and referrals given as necessary.  Abnormal results of tests will be discussed with her when all of her results are completed.  Routine preventative health maintenance measures emphasized: Exercise/Diet/Weight control, Tobacco Warnings, Alcohol/Substance use risks and Stress Management Mammogram and blood work through primary care physician 2.  Expectant  management of labial cyst 3.  Restart Premarin vaginal cream Orders No orders of the defined types were placed in this encounter.   No orders of the defined types were placed in this encounter.     F/U  Return in about 1 year (around 10/20/2020) for Annual Physical.  Finis Bud, M.D. 10/21/2019 2:25 PM

## 2020-01-07 ENCOUNTER — Other Ambulatory Visit: Payer: Self-pay | Admitting: Internal Medicine

## 2020-01-07 DIAGNOSIS — Z1231 Encounter for screening mammogram for malignant neoplasm of breast: Secondary | ICD-10-CM

## 2020-03-24 ENCOUNTER — Encounter: Payer: Self-pay | Admitting: Oncology

## 2020-03-24 ENCOUNTER — Other Ambulatory Visit: Payer: Self-pay

## 2020-03-24 ENCOUNTER — Inpatient Hospital Stay: Payer: Medicare Other | Attending: Oncology

## 2020-03-24 ENCOUNTER — Inpatient Hospital Stay (HOSPITAL_BASED_OUTPATIENT_CLINIC_OR_DEPARTMENT_OTHER): Payer: Medicare Other | Admitting: Oncology

## 2020-03-24 VITALS — BP 181/88 | HR 54 | Temp 96.0°F | Resp 18 | Wt 150.8 lb

## 2020-03-24 DIAGNOSIS — C9110 Chronic lymphocytic leukemia of B-cell type not having achieved remission: Secondary | ICD-10-CM | POA: Diagnosis present

## 2020-03-24 DIAGNOSIS — R634 Abnormal weight loss: Secondary | ICD-10-CM

## 2020-03-24 DIAGNOSIS — E039 Hypothyroidism, unspecified: Secondary | ICD-10-CM | POA: Diagnosis not present

## 2020-03-24 DIAGNOSIS — I10 Essential (primary) hypertension: Secondary | ICD-10-CM | POA: Diagnosis not present

## 2020-03-24 DIAGNOSIS — Z720 Tobacco use: Secondary | ICD-10-CM | POA: Diagnosis not present

## 2020-03-24 DIAGNOSIS — F17210 Nicotine dependence, cigarettes, uncomplicated: Secondary | ICD-10-CM | POA: Insufficient documentation

## 2020-03-24 DIAGNOSIS — Z79899 Other long term (current) drug therapy: Secondary | ICD-10-CM | POA: Diagnosis not present

## 2020-03-24 LAB — CBC WITH DIFFERENTIAL/PLATELET
Abs Immature Granulocytes: 0 10*3/uL (ref 0.00–0.07)
Basophils Absolute: 0 10*3/uL (ref 0.0–0.1)
Basophils Relative: 0 %
Eosinophils Absolute: 0.8 10*3/uL — ABNORMAL HIGH (ref 0.0–0.5)
Eosinophils Relative: 4 %
HCT: 36 % (ref 36.0–46.0)
Hemoglobin: 12.1 g/dL (ref 12.0–15.0)
Lymphocytes Relative: 69 %
Lymphs Abs: 13 10*3/uL — ABNORMAL HIGH (ref 0.7–4.0)
MCH: 30.9 pg (ref 26.0–34.0)
MCHC: 33.6 g/dL (ref 30.0–36.0)
MCV: 91.8 fL (ref 80.0–100.0)
Monocytes Absolute: 0.8 10*3/uL (ref 0.1–1.0)
Monocytes Relative: 4 %
Neutro Abs: 4.3 10*3/uL (ref 1.7–7.7)
Neutrophils Relative %: 23 %
Platelets: 203 10*3/uL (ref 150–400)
RBC: 3.92 MIL/uL (ref 3.87–5.11)
RDW: 13.7 % (ref 11.5–15.5)
Smear Review: NORMAL
WBC: 18.9 10*3/uL — ABNORMAL HIGH (ref 4.0–10.5)
nRBC: 0 % (ref 0.0–0.2)

## 2020-03-24 LAB — COMPREHENSIVE METABOLIC PANEL
ALT: 13 U/L (ref 0–44)
AST: 13 U/L — ABNORMAL LOW (ref 15–41)
Albumin: 4.2 g/dL (ref 3.5–5.0)
Alkaline Phosphatase: 57 U/L (ref 38–126)
Anion gap: 8 (ref 5–15)
BUN: 10 mg/dL (ref 8–23)
CO2: 27 mmol/L (ref 22–32)
Calcium: 9.5 mg/dL (ref 8.9–10.3)
Chloride: 104 mmol/L (ref 98–111)
Creatinine, Ser: 1.04 mg/dL — ABNORMAL HIGH (ref 0.44–1.00)
GFR calc Af Amer: >60 mL/min (ref >60)
GFR calc non Af Amer: 53 mL/min — ABNORMAL LOW (ref >60)
Glucose, Bld: 100 mg/dL — ABNORMAL HIGH (ref 70–99)
Potassium: 4.3 mmol/L (ref 3.5–5.1)
Sodium: 139 mmol/L (ref 135–145)
Total Bilirubin: 0.3 mg/dL (ref 0.3–1.2)
Total Protein: 7.1 g/dL (ref 6.5–8.1)

## 2020-03-24 NOTE — Progress Notes (Signed)
Hematology/Oncology Follow up  note [facility] [phone] [phone]   Patient Care Team: Perrin Maltese, MD as PCP - General (Internal Medicine)  REFERRING PROVIDER:  Perrin Maltese, MD  REASON FOR VISIT Follow up for treatment of CLL  HISTORY OF PRESENTING ILLNESS:  Karen [name] is a  74 y.o.  female with PMH listed below who was referred to me for evaluation of persistent leukocytosis/lymphocytosis. Reviewed patient's lab work done at PCPs office. 05/01/2017 WBC 11.7 absolute lymphocytes 7.4. Hemoglobin 13 MCV 95 platelet counts 225,000. 06/22/2017 WBC is 17.5, absolute lymphocyte 11.7 hemoglobin 11.9, MCV 92, platelet count 240,000. Patient reports feeling tired at baseline. Otherwise denies any night sweats, fever or chills. She reports she got sick around holiday. But today she feels better. She is active smoker, currently 1 or 2 cigarettes a day  # 10/14/2015 chest without contrast Images were independently reviewed by me Small pulmonary nodules are stable over the past year.  Considered benign.  Several lymph nodes are borderline enlarged, including the lower right paratracheal lymph node and a portacaval lymph node. 08/27/2017 US abdomen complete, no hepato-splenomegaly.   INTERVAL HISTORY Karen [name] is a 74 y.o. female who has above history reviewed by me today present for follow-up visit for CLL She has had gum surgery recently, had lost weight due to not able to eat very well recently.  No fever chills, night sweat She feels tired.  Continues to smoke daily.   Review of Systems  Constitutional: Positive for fatigue and unexpected weight change. Negative for appetite change, chills and fever.  HENT:   Negative for hearing loss and voice change.   Eyes: Negative for eye problems.  Respiratory: Negative for chest tightness and cough.   Cardiovascular: Negative for chest pain.  Gastrointestinal: Negative for abdominal  distention, abdominal pain and blood in stool.  Endocrine: Negative for hot flashes.  Genitourinary: Negative for difficulty urinating and frequency.   Musculoskeletal: Negative for arthralgias.  Skin: Negative for itching and rash.  Neurological: Negative for extremity weakness.  Hematological: Negative for adenopathy.  Psychiatric/Behavioral: Negative for confusion.     MEDICAL HISTORY:  Past Medical History:  Diagnosis Date  . Anxiety   . Artery occlusion   . Asthma   . Back pain   . Bronchitis   . Cancer ([place])    Skin Cancer  . CLL (chronic lymphocytic leukemia) ([place])   . Coronary artery disease   . Depression   . Dyspnea   . GERD (gastroesophageal reflux disease)   . History of kidney stones   . Hypercholesteremia   . Hyperlipemia   . Hypertension   . Hypothyroidism   . Myocardial infarction ([place]) 1999  . Neuropathy   . Restless leg syndrome   . Sleep apnea    use C-PAP  . Wears dentures    full upper, partial lower.  doesn't currently wear    SURGICAL HISTORY: Past Surgical History:  Procedure Laterality Date  . COLONOSCOPY WITH PROPOFOL N/A 11/20/2016   Procedure: COLONOSCOPY WITH PROPOFOL;  Surgeon: Lucilla Lame, MD;  Location: [place];  Service: Endoscopy;  Laterality: N/A;  sleep apnea  . CORONARY ANGIOPLASTY WITH STENT PLACEMENT  2008  . DILATION AND CURETTAGE OF UTERUS    . EYE SURGERY Bilateral    Cataract Extraction with IOL  . HAND SURGERY Left 12/2017   upper hand fracture repair  . LEFT HEART CATH AND CORONARY ANGIOGRAPHY N/A 06/29/2017   Procedure: LEFT HEART  CATH AND CORONARY ANGIOGRAPHY;  Surgeon: Dionisio David, MD;  Location: [place] [facility];  Service: Cardiovascular;  Laterality: N/A;  . LEFT HEART CATH AND CORONARY ANGIOGRAPHY Left 12/02/2018   Procedure: LEFT HEART CATH AND CORONARY ANGIOGRAPHY;  Surgeon: Dionisio David, MD;  Location: [place] [facility];  Service: Cardiovascular;  Laterality: Left;  . OPEN REDUCTION  INTERNAL FIXATION (ORIF) DISTAL RADIAL FRACTURE Right 08/12/2018   Procedure: OPEN REDUCTION INTERNAL FIXATION (ORIF) DISTAL RADIAL FRACTURE;  Surgeon: [name], MD;  Location: [facility];  Service: Orthopedics;  Laterality: Right;  . POLYPECTOMY  11/20/2016   Procedure: POLYPECTOMY;  Surgeon: Lucilla Lame, MD;  Location: [place];  Service: Endoscopy;;  . TUBAL LIGATION    . VAGINAL HYSTERECTOMY    . VULVECTOMY Right 01/31/2016   Procedure: WIDE EXCISION VULVECTOMY-RIGHT LABIA;  Surgeon: Brayton Mars, MD;  Location: [facility];  Service: Gynecology;  Laterality: Right;    SOCIAL HISTORY: Social History   Socioeconomic History  . Marital status: Single    Spouse name: Not on file  . Number of children: 5  . Years of education: Not on file  . Highest education level: Not on file  Occupational History  . Occupation: part time job    Comment: Engineer, building services  Tobacco Use  . Smoking status: Current Every Day Smoker    Packs/day: 0.50    Years: 40.00    Pack years: 20.00    Types: Cigarettes  . Smokeless tobacco: Current User    Types: Snuff  Vaping Use  . Vaping Use: Never used  Substance and Sexual Activity  . Alcohol use: No    Alcohol/week: 0.0 standard drinks  . Drug use: No  . Sexual activity: Not Currently    Partners: Male    Birth control/protection: Surgical  Other Topics Concern  . Not on file  Social History Narrative  . Not on file   Social Determinants of Health   Financial Resource Strain:   . Difficulty of Paying Living Expenses: Not on file  Food Insecurity:   . Worried About Charity fundraiser in the Last Year: Not on file  . Ran Out of Food in the Last Year: Not on file  Transportation Needs:   . Lack of Transportation (Medical): Not on file  . Lack of Transportation (Non-Medical): Not on file  Physical Activity:   . Days of Exercise per Week: Not on file  . Minutes of Exercise per Session: Not on file  Stress:   . Feeling of  Stress : Not on file  Social Connections:   . Frequency of Communication with Friends and Family: Not on file  . Frequency of Social Gatherings with Friends and Family: Not on file  . Attends Religious Services: Not on file  . Active Member of Clubs or Organizations: Not on file  . Attends Archivist Meetings: Not on file  . Marital Status: Not on file  Intimate Partner Violence:   . Fear of Current or Ex-Partner: Not on file  . Emotionally Abused: Not on file  . Physically Abused: Not on file  . Sexually Abused: Not on file    FAMILY HISTORY: Family History  Problem Relation Age of Onset  . Heart disease Father   . Stroke Mother   . Hypertension Mother   . Lung cancer Brother   . Heart attack Brother   . Congestive Heart Failure Sister   . Cervical cancer Sister   .  Cancer Neg Hx   . Diabetes Neg Hx   . Breast cancer Neg Hx     ALLERGIES:  is allergic to requip [ropinirole hcl], celebrex [celecoxib], meloxicam, and nitrofurantoin.  MEDICATIONS:  Current Outpatient Medications  Medication Sig Dispense Refill  . acetaminophen (TYLENOL) 500 MG tablet Take 1,000 mg by mouth every 6 (six) hours as needed for moderate pain or headache.    . albuterol (PROVENTIL) (2.5 MG/3ML) 0.083% nebulizer solution Take 3 mLs (2.5 mg total) by nebulization every 4 (four) hours as needed for wheezing or shortness of breath. 75 mL 0  . alendronate (FOSAMAX) 70 MG tablet Take 70 mg by mouth every Wednesday.     Marland Kitchen amiodarone (PACERONE) 200 MG tablet Take 200 mg by mouth daily.    Marland Kitchen aspirin EC 81 MG tablet Take 81 mg by mouth daily.    . budesonide-formoterol (SYMBICORT) 160-4.5 MCG/ACT inhaler Inhale 2 puffs into the lungs 2 (two) times daily as needed (shortness of breath).    . cholecalciferol (VITAMIN D3) 25 MCG (1000 UT) tablet Take 1,000 Units by mouth daily.    . clopidogrel (PLAVIX) 75 MG tablet Take 75 mg by mouth daily.     Marland Kitchen conjugated estrogens (PREMARIN) vaginal cream 1 gram  intravaginal BIW 60 g 1  . dexlansoprazole (DEXILANT) 60 MG capsule Take 60 mg by mouth daily.     . DULoxetine (CYMBALTA) 60 MG capsule Take 60 mg by mouth daily.    . fluticasone (FLONASE) 50 MCG/ACT nasal spray Place 1 spray into both nostrils daily as needed for allergies or rhinitis.    . furosemide (LASIX) 20 MG tablet Take 20 mg by mouth daily.  3  . gabapentin (NEURONTIN) 400 MG capsule Take 400 mg by mouth 2 (two) times daily.     . hydrALAZINE (APRESOLINE) 25 MG tablet Take 25 mg by mouth daily.    . isosorbide mononitrate (IMDUR) 60 MG 24 hr tablet Take 60 mg by mouth daily.    Marland Kitchen lisinopril (PRINIVIL,ZESTRIL) 40 MG tablet Take 40 mg by mouth daily.    Marland Kitchen loratadine (CLARITIN) 10 MG tablet Take 10 mg by mouth daily.    . meclizine (ANTIVERT) 25 MG tablet Take by mouth.    . metoprolol succinate (TOPROL-XL) 50 MG 24 hr tablet Take 25 mg by mouth daily.     . Nebulizers (COMPRESSOR/NEBULIZER) MISC 1 Units by Does not apply route every 4 (four) hours as needed. 1 each 0  . potassium chloride (K-DUR,KLOR-CON) 10 MEQ tablet Take 10 mEq by mouth daily.    Marland Kitchen REPATHA 140 MG/ML SOSY USE AS DIRECTED, TWICE A MONTH    . simvastatin (ZOCOR) 80 MG tablet Take 80 mg by mouth daily.    . Tetrahydrozoline HCl (VISINE OP) Place 1 drop into both eyes daily as needed (dry eyes).    Marland Kitchen HYDROcodone-acetaminophen (NORCO) 7.5-325 MG tablet Take 1-2 tablets by mouth every 6 (six) hours as needed for moderate pain. (Patient not taking: Reported on 03/24/2020) 30 tablet 0  . nitroGLYCERIN (NITROSTAT) 0.4 MG SL tablet Place 0.4 mg under the tongue every 5 (five) minutes as needed for chest pain.  (Patient not taking: Reported on 03/24/2020)    . PREMARIN vaginal cream PLACE 2.63 (7/8HY) APPLICATORFUL VAGINALLY 2 (TWO) TIMES A WEEK. (Patient not taking: Reported on 03/24/2020) 30 g 12  . VIIBRYD 20 MG TABS 10 mg for 7 days then increase to 20mg . (Patient not taking: Reported on 03/24/2020)     No  current  facility-administered medications for this visit.     PHYSICAL EXAMINATION: ECOG PERFORMANCE STATUS: 1 - Symptomatic but completely ambulatory Vitals:   03/24/20 1315  BP: (!) 181/88  Pulse: (!) 54  Resp: 18  Temp: (!) 96 F (35.6 C)   Filed Weights   03/24/20 1315  Weight: 150 lb 12.8 oz (68.4 kg)    Physical Exam Constitutional:      General: She is not in acute distress.    Appearance: She is not diaphoretic.  HENT:     Head: Normocephalic and atraumatic.     Nose: Nose normal.     Mouth/Throat:     Pharynx: No oropharyngeal exudate.  Eyes:     General: No scleral icterus.       Left eye: No discharge.     Conjunctiva/sclera: Conjunctivae normal.     Pupils: Pupils are equal, round, and reactive to light.  Neck:     Vascular: No JVD.  Cardiovascular:     Rate and Rhythm: Normal rate and regular rhythm.     Heart sounds: No murmur heard.   Pulmonary:     Effort: Pulmonary effort is normal. No respiratory distress.     Breath sounds: No rales.  Chest:     Chest wall: No tenderness.  Abdominal:     General: Bowel sounds are normal. There is no distension.     Palpations: Abdomen is soft.     Tenderness: There is no abdominal tenderness.  Musculoskeletal:        General: Normal range of motion.     Cervical back: Normal range of motion and neck supple.  Lymphadenopathy:     Cervical: No cervical adenopathy.  Skin:    General: Skin is warm and dry.     Findings: No erythema.  Neurological:     Mental Status: She is alert and oriented to person, place, and time.     Cranial Nerves: No cranial nerve deficit.     Motor: No abnormal muscle tone.     Coordination: Coordination normal.  Psychiatric:        Mood and Affect: Affect normal.        Judgment: Judgment normal.      LABORATORY DATA:  I have reviewed the data as listed Lab Results  Component Value Date   WBC 18.9 (H) 03/24/2020   HGB 12.1 03/24/2020   HCT 36.0 03/24/2020   MCV 91.8  03/24/2020   PLT 203 03/24/2020   Recent Labs    06/24/19 1452 09/22/19 1109 03/24/20 1251  NA 136 137 139  K 4.0 3.2* 4.3  CL 100 102 104  CO2 29 25 27   GLUCOSE 108* 98 100*  BUN 16 10 10   CREATININE 1.19* 1.07* 1.04*  CALCIUM 10.1 9.9 9.5  GFRNONAA 45* 51* 53*  GFRAA 52* 59* >60  PROT 7.8 7.4 7.1  ALBUMIN 4.5 4.2 4.2  AST 18 16 13*  ALT 20 12 13   ALKPHOS 68 62 57  BILITOT 0.4 0.3 0.3    Serum Osmolarity 266, urine Osm 374, urine sodium 21.    ASSESSMENT & PLAN:  1. CLL (chronic lymphocytic leukemia) ([place])   2. Weight loss   3. Tobacco abuse     # CLL,  Increased leukocytosis, likely reactive to recent gum surgery, vs worsening of her CLL Weight loss maybe due to decreased oral intake due to gum surgery, will need watch for other etiolgies.  Short term follow up in 3 months.   #  Tobacco abuse,  More than 30 pack year smoking history, cessation was discussed with patient. Refer to lung cancer screening program.  # High BP, she did not take her BP med today. Asymptomatic. Recommend pt to take med and monitor BP at home. Follow up with PCP. # I recommend patient to get COVID 19 vaccination.   All questions were answered. The patient knows to call the clinic with any problems questions or concerns. Orders Placed This Encounter  Procedures  . CBC with Differential/Platelet    Standing Status:   Future    Standing Expiration Date:   03/24/2021  . Comprehensive metabolic panel    Standing Status:   Future    Standing Expiration Date:   03/24/2021  . Lactate dehydrogenase    Standing Status:   Future    Standing Expiration Date:   03/24/2021    Return of visit  3 months Earlie Server, MD, PhD Hematology Oncology [facility] 03/24/2020

## 2020-03-24 NOTE — Progress Notes (Signed)
Pt here for follow up. No new concerns voiced.   

## 2020-04-08 ENCOUNTER — Other Ambulatory Visit: Payer: Self-pay

## 2020-04-08 ENCOUNTER — Ambulatory Visit
Admission: RE | Admit: 2020-04-08 | Discharge: 2020-04-08 | Disposition: A | Discharge status: Home / Self Care | Payer: Medicare Other | Source: Ambulatory Visit | Attending: Internal Medicine | Admitting: Internal Medicine

## 2020-04-08 DIAGNOSIS — Z1231 Encounter for screening mammogram for malignant neoplasm of breast: Secondary | ICD-10-CM | POA: Insufficient documentation

## 2020-06-23 ENCOUNTER — Inpatient Hospital Stay (HOSPITAL_BASED_OUTPATIENT_CLINIC_OR_DEPARTMENT_OTHER): Payer: Medicare Other | Admitting: Oncology

## 2020-06-23 ENCOUNTER — Encounter: Payer: Self-pay | Admitting: Oncology

## 2020-06-23 ENCOUNTER — Inpatient Hospital Stay: Payer: Medicare Other | Attending: Oncology

## 2020-06-23 ENCOUNTER — Other Ambulatory Visit: Payer: Self-pay

## 2020-06-23 VITALS — BP 184/86 | HR 60 | Temp 97.6°F | Resp 18 | Wt 152.9 lb

## 2020-06-23 DIAGNOSIS — I10 Essential (primary) hypertension: Secondary | ICD-10-CM

## 2020-06-23 DIAGNOSIS — C9110 Chronic lymphocytic leukemia of B-cell type not having achieved remission: Secondary | ICD-10-CM | POA: Insufficient documentation

## 2020-06-23 DIAGNOSIS — F17210 Nicotine dependence, cigarettes, uncomplicated: Secondary | ICD-10-CM | POA: Insufficient documentation

## 2020-06-23 DIAGNOSIS — Z720 Tobacco use: Secondary | ICD-10-CM

## 2020-06-23 LAB — CBC WITH DIFFERENTIAL/PLATELET
Abs Immature Granulocytes: 0.03 10*3/uL (ref 0.00–0.07)
Basophils Absolute: 0.1 10*3/uL (ref 0.0–0.1)
Basophils Relative: 0 %
Eosinophils Absolute: 0.3 10*3/uL (ref 0.0–0.5)
Eosinophils Relative: 1 %
HCT: 37.5 % (ref 36.0–46.0)
Hemoglobin: 12.1 g/dL (ref 12.0–15.0)
Immature Granulocytes: 0 %
Lymphocytes Relative: 76 %
Lymphs Abs: 19.2 10*3/uL — ABNORMAL HIGH (ref 0.7–4.0)
MCH: 30.6 pg (ref 26.0–34.0)
MCHC: 32.3 g/dL (ref 30.0–36.0)
MCV: 94.9 fL (ref 80.0–100.0)
Monocytes Absolute: 2.7 10*3/uL — ABNORMAL HIGH (ref 0.1–1.0)
Monocytes Relative: 11 %
Neutro Abs: 3 10*3/uL (ref 1.7–7.7)
Neutrophils Relative %: 12 %
Platelets: 217 10*3/uL (ref 150–400)
RBC: 3.95 MIL/uL (ref 3.87–5.11)
RDW: 13.2 % (ref 11.5–15.5)
Smear Review: NORMAL
WBC: 25.3 10*3/uL — ABNORMAL HIGH (ref 4.0–10.5)
nRBC: 0 % (ref 0.0–0.2)

## 2020-06-23 LAB — COMPREHENSIVE METABOLIC PANEL
ALT: 13 U/L (ref 0–44)
AST: 15 U/L (ref 15–41)
Albumin: 4 g/dL (ref 3.5–5.0)
Alkaline Phosphatase: 55 U/L (ref 38–126)
Anion gap: 9 (ref 5–15)
BUN: 12 mg/dL (ref 8–23)
CO2: 27 mmol/L (ref 22–32)
Calcium: 9.7 mg/dL (ref 8.9–10.3)
Chloride: 102 mmol/L (ref 98–111)
Creatinine, Ser: 0.91 mg/dL (ref 0.44–1.00)
GFR, Estimated: >60 mL/min (ref >60)
Glucose, Bld: 86 mg/dL (ref 70–99)
Potassium: 4 mmol/L (ref 3.5–5.1)
Sodium: 138 mmol/L (ref 135–145)
Total Bilirubin: 0.5 mg/dL (ref 0.3–1.2)
Total Protein: 6.8 g/dL (ref 6.5–8.1)

## 2020-06-23 LAB — LACTATE DEHYDROGENASE: LDH: 110 U/L (ref 98–192)

## 2020-06-23 NOTE — Progress Notes (Signed)
Pt here for follow up and reports increased tiredness.

## 2020-06-23 NOTE — Progress Notes (Signed)
Hematology/Oncology Follow up  note [facility] [phone] [phone]   Patient Care Team: Perrin Maltese, MD as PCP - General (Internal Medicine) Earlie Server, MD as Consulting Physician (Hematology and Oncology)  REFERRING PROVIDER:  Perrin Maltese, MD  REASON FOR VISIT Follow up for treatment of CLL  HISTORY OF PRESENTING ILLNESS:  Karen [name] is a  74 y.o.  female with PMH listed below who was referred to me for evaluation of persistent leukocytosis/lymphocytosis. Reviewed patient's lab work done at PCPs office. 05/01/2017 WBC 11.7 absolute lymphocytes 7.4. Hemoglobin 13 MCV 95 platelet counts 225,000. 06/22/2017 WBC is 17.5, absolute lymphocyte 11.7 hemoglobin 11.9, MCV 92, platelet count 240,000. Patient reports feeling tired at baseline. Otherwise denies any night sweats, fever or chills. She reports she got sick around holiday. But today she feels better. She is active smoker, currently 1 or 2 cigarettes a day  # 10/14/2015 chest without contrast Images were independently reviewed by me Small pulmonary nodules are stable over the past year.  Considered benign.  Several lymph nodes are borderline enlarged, including the lower right paratracheal lymph node and a portacaval lymph node. 08/27/2017 US abdomen complete, no hepato-splenomegaly.   INTERVAL HISTORY Karen [name] is a 74 y.o. female who has above history reviewed by me today present for follow-up visit for CLL Patient feels tired, more fatigued.  Otherwise no unintentional weight loss.  Appetite is fair.  And she has gained 2 pounds since 3 months ago. She has recently received first dose of COVID-19 vaccination a few weeks ago.  No fever, Chills, unintentional weight loss, night sweats  Continues to smoke  Review of Systems  Constitutional: Positive for fatigue. Negative for appetite change, chills, fever and unexpected weight change.  HENT:   Negative for hearing loss and  voice change.   Eyes: Negative for eye problems.  Respiratory: Negative for chest tightness and cough.   Cardiovascular: Negative for chest pain.  Gastrointestinal: Negative for abdominal distention, abdominal pain and blood in stool.  Endocrine: Negative for hot flashes.  Genitourinary: Negative for difficulty urinating and frequency.   Musculoskeletal: Negative for arthralgias.  Skin: Negative for itching and rash.  Neurological: Negative for extremity weakness.  Hematological: Negative for adenopathy.  Psychiatric/Behavioral: Negative for confusion.     MEDICAL HISTORY:  Past Medical History:  Diagnosis Date  . Anxiety   . Artery occlusion   . Asthma   . Back pain   . Bronchitis   . Cancer ([place])    Skin Cancer  . CLL (chronic lymphocytic leukemia) ([place])   . Coronary artery disease   . Depression   . Dyspnea   . GERD (gastroesophageal reflux disease)   . History of kidney stones   . Hypercholesteremia   . Hyperlipemia   . Hypertension   . Hypothyroidism   . Myocardial infarction ([place]) 1999  . Neuropathy   . Restless leg syndrome   . Sleep apnea    use C-PAP  . Wears dentures    full upper, partial lower.  doesn't currently wear    SURGICAL HISTORY: Past Surgical History:  Procedure Laterality Date  . COLONOSCOPY WITH PROPOFOL N/A 11/20/2016   Procedure: COLONOSCOPY WITH PROPOFOL;  Surgeon: Lucilla Lame, MD;  Location: [place];  Service: Endoscopy;  Laterality: N/A;  sleep apnea  . CORONARY ANGIOPLASTY WITH STENT PLACEMENT  2008  . DILATION AND CURETTAGE OF UTERUS    . EYE SURGERY Bilateral    Cataract Extraction with IOL  .  HAND SURGERY Left 12/2017   upper hand fracture repair  . LEFT HEART CATH AND CORONARY ANGIOGRAPHY N/A 06/29/2017   Procedure: LEFT HEART CATH AND CORONARY ANGIOGRAPHY;  Surgeon: Dionisio David, MD;  Location: [place] [facility];  Service: Cardiovascular;  Laterality: N/A;  . LEFT HEART CATH AND CORONARY ANGIOGRAPHY Left  12/02/2018   Procedure: LEFT HEART CATH AND CORONARY ANGIOGRAPHY;  Surgeon: Dionisio David, MD;  Location: [place] [facility];  Service: Cardiovascular;  Laterality: Left;  . OPEN REDUCTION INTERNAL FIXATION (ORIF) DISTAL RADIAL FRACTURE Right 08/12/2018   Procedure: OPEN REDUCTION INTERNAL FIXATION (ORIF) DISTAL RADIAL FRACTURE;  Surgeon: [name], MD;  Location: [facility];  Service: Orthopedics;  Laterality: Right;  . POLYPECTOMY  11/20/2016   Procedure: POLYPECTOMY;  Surgeon: Lucilla Lame, MD;  Location: [place];  Service: Endoscopy;;  . TUBAL LIGATION    . VAGINAL HYSTERECTOMY    . VULVECTOMY Right 01/31/2016   Procedure: WIDE EXCISION VULVECTOMY-RIGHT LABIA;  Surgeon: Brayton Mars, MD;  Location: [facility];  Service: Gynecology;  Laterality: Right;    SOCIAL HISTORY: Social History   Socioeconomic History  . Marital status: Single    Spouse name: Not on file  . Number of children: 5  . Years of education: Not on file  . Highest education level: Not on file  Occupational History  . Occupation: part time job    Comment: Engineer, building services  Tobacco Use  . Smoking status: Current Every Day Smoker    Packs/day: 0.50    Years: 40.00    Pack years: 20.00    Types: Cigarettes  . Smokeless tobacco: Current User    Types: Snuff  Vaping Use  . Vaping Use: Never used  Substance and Sexual Activity  . Alcohol use: No    Alcohol/week: 0.0 standard drinks  . Drug use: No  . Sexual activity: Not Currently    Partners: Male    Birth control/protection: Surgical  Other Topics Concern  . Not on file  Social History Narrative  . Not on file   Social Determinants of Health   Financial Resource Strain:   . Difficulty of Paying Living Expenses: Not on file  Food Insecurity:   . Worried About Charity fundraiser in the Last Year: Not on file  . Ran Out of Food in the Last Year: Not on file  Transportation Needs:   . Lack of Transportation (Medical): Not on file  .  Lack of Transportation (Non-Medical): Not on file  Physical Activity:   . Days of Exercise per Week: Not on file  . Minutes of Exercise per Session: Not on file  Stress:   . Feeling of Stress : Not on file  Social Connections:   . Frequency of Communication with Friends and Family: Not on file  . Frequency of Social Gatherings with Friends and Family: Not on file  . Attends Religious Services: Not on file  . Active Member of Clubs or Organizations: Not on file  . Attends Archivist Meetings: Not on file  . Marital Status: Not on file  Intimate Partner Violence:   . Fear of Current or Ex-Partner: Not on file  . Emotionally Abused: Not on file  . Physically Abused: Not on file  . Sexually Abused: Not on file    FAMILY HISTORY: Family History  Problem Relation Age of Onset  . Heart disease Father   . Stroke Mother   . Hypertension Mother   .  Lung cancer Brother   . Heart attack Brother   . Congestive Heart Failure Sister   . Cervical cancer Sister   . Cancer Neg Hx   . Diabetes Neg Hx   . Breast cancer Neg Hx     ALLERGIES:  is allergic to requip [ropinirole hcl], celebrex [celecoxib], meloxicam, and nitrofurantoin.  MEDICATIONS:  Current Outpatient Medications  Medication Sig Dispense Refill  . albuterol (PROVENTIL) (2.5 MG/3ML) 0.083% nebulizer solution Take 3 mLs (2.5 mg total) by nebulization every 4 (four) hours as needed for wheezing or shortness of breath. 75 mL 0  . alendronate (FOSAMAX) 70 MG tablet Take 70 mg by mouth every Wednesday.     Marland Kitchen amiodarone (PACERONE) 200 MG tablet Take 200 mg by mouth daily.    Marland Kitchen aspirin EC 81 MG tablet Take 81 mg by mouth daily.    . budesonide-formoterol (SYMBICORT) 160-4.5 MCG/ACT inhaler Inhale 2 puffs into the lungs 2 (two) times daily as needed (shortness of breath).    Marland Kitchen buPROPion (WELLBUTRIN XL) 150 MG 24 hr tablet Take 150 mg by mouth at bedtime.    . cholecalciferol (VITAMIN D3) 25 MCG (1000 UT) tablet Take 1,000  Units by mouth daily.    . clopidogrel (PLAVIX) 75 MG tablet Take 75 mg by mouth daily.     Marland Kitchen conjugated estrogens (PREMARIN) vaginal cream 1 gram intravaginal BIW 60 g 1  . dexlansoprazole (DEXILANT) 60 MG capsule Take 60 mg by mouth daily.     . DULoxetine (CYMBALTA) 60 MG capsule Take 60 mg by mouth daily.    . fluticasone (FLONASE) 50 MCG/ACT nasal spray Place 1 spray into both nostrils daily as needed for allergies or rhinitis.    Marland Kitchen gabapentin (NEURONTIN) 400 MG capsule Take 400 mg by mouth 2 (two) times daily.     . hydrALAZINE (APRESOLINE) 25 MG tablet Take 25 mg by mouth daily.    Marland Kitchen HYDROcodone-acetaminophen (NORCO) 7.5-325 MG tablet Take 1-2 tablets by mouth every 6 (six) hours as needed for moderate pain. 30 tablet 0  . isosorbide mononitrate (IMDUR) 60 MG 24 hr tablet Take 60 mg by mouth daily.    Marland Kitchen lisinopril (PRINIVIL,ZESTRIL) 40 MG tablet Take 40 mg by mouth daily.    Marland Kitchen loratadine (CLARITIN) 10 MG tablet Take 10 mg by mouth daily.    . meclizine (ANTIVERT) 25 MG tablet Take by mouth.    . metoprolol succinate (TOPROL-XL) 50 MG 24 hr tablet Take 25 mg by mouth daily.     . Nebulizers (COMPRESSOR/NEBULIZER) MISC 1 Units by Does not apply route every 4 (four) hours as needed. 1 each 0  . potassium chloride (K-DUR,KLOR-CON) 10 MEQ tablet Take 10 mEq by mouth daily.    Marland Kitchen REPATHA 140 MG/ML SOSY USE AS DIRECTED, TWICE A MONTH    . Tetrahydrozoline HCl (VISINE OP) Place 1 drop into both eyes daily as needed (dry eyes).    Marland Kitchen acetaminophen (TYLENOL) 500 MG tablet Take 1,000 mg by mouth every 6 (six) hours as needed for moderate pain or headache. (Patient not taking: Reported on 06/23/2020)    . furosemide (LASIX) 20 MG tablet Take 20 mg by mouth daily. (Patient not taking: Reported on 06/23/2020)  3  . nitroGLYCERIN (NITROSTAT) 0.4 MG SL tablet Place 0.4 mg under the tongue every 5 (five) minutes as needed for chest pain.  (Patient not taking: Reported on 03/24/2020)    . PREMARIN vaginal cream  PLACE 6.04 (5/4UJ) APPLICATORFUL VAGINALLY 2 (TWO) TIMES A  WEEK. (Patient not taking: Reported on 03/24/2020) 30 g 12  . simvastatin (ZOCOR) 80 MG tablet Take 80 mg by mouth daily. (Patient not taking: Reported on 06/23/2020)    . VIIBRYD 20 MG TABS 10 mg for 7 days then increase to 20mg . (Patient not taking: Reported on 03/24/2020)     No current facility-administered medications for this visit.     PHYSICAL EXAMINATION: ECOG PERFORMANCE STATUS: 1 - Symptomatic but completely ambulatory Vitals:   06/23/20 1323  BP: (!) 184/86  Pulse: 60  Resp: 18  Temp: 97.6 F (36.4 C)   Filed Weights   06/23/20 1323  Weight: 152 lb 14.4 oz (69.4 kg)    Physical Exam Constitutional:      General: She is not in acute distress.    Appearance: She is not diaphoretic.  HENT:     Head: Normocephalic and atraumatic.     Nose: Nose normal.     Mouth/Throat:     Pharynx: No oropharyngeal exudate.  Eyes:     General: No scleral icterus.       Left eye: No discharge.     Conjunctiva/sclera: Conjunctivae normal.     Pupils: Pupils are equal, round, and reactive to light.  Neck:     Vascular: No JVD.  Cardiovascular:     Rate and Rhythm: Normal rate and regular rhythm.     Heart sounds: No murmur heard.   Pulmonary:     Effort: Pulmonary effort is normal. No respiratory distress.     Breath sounds: No rales.  Chest:     Chest wall: No tenderness.  Abdominal:     General: Bowel sounds are normal. There is no distension.     Palpations: Abdomen is soft.     Tenderness: There is no abdominal tenderness.  Musculoskeletal:        General: Normal range of motion.     Cervical back: Normal range of motion and neck supple.  Lymphadenopathy:     Cervical: No cervical adenopathy.  Skin:    General: Skin is warm and dry.     Findings: No erythema.  Neurological:     Mental Status: She is alert and oriented to person, place, and time.     Cranial Nerves: No cranial nerve deficit.     Motor: No  abnormal muscle tone.     Coordination: Coordination normal.  Psychiatric:        Mood and Affect: Affect normal.        Judgment: Judgment normal.      LABORATORY DATA:  I have reviewed the data as listed Lab Results  Component Value Date   WBC 25.3 (H) 06/23/2020   HGB 12.1 06/23/2020   HCT 37.5 06/23/2020   MCV 94.9 06/23/2020   PLT 217 06/23/2020   Recent Labs    09/22/19 1109 03/24/20 1251 06/23/20 1305  NA 137 139 138  K 3.2* 4.3 4.0  CL 102 104 102  CO2 25 27 27   GLUCOSE 98 100* 86  BUN 10 10 12   CREATININE 1.07* 1.04* 0.91  CALCIUM 9.9 9.5 9.7  GFRNONAA 51* 53* >60  GFRAA 59* >60  --   PROT 7.4 7.1 6.8  ALBUMIN 4.2 4.2 4.0  AST 16 13* 15  ALT 12 13 13   ALKPHOS 62 57 55  BILITOT 0.3 0.3 0.5    Serum Osmolarity 266, urine Osm 374, urine sodium 21.    ASSESSMENT & PLAN:  1. CLL (chronic lymphocytic leukemia) ([place])  2. Tobacco abuse   3. Uncontrolled hypertension     # CLL,  Increased leukocytosis, likely reactive to recent COVID-19 vaccination, vs worsening of her CLL Otherwise she has no constitutional symptoms. Weight loss was probably secondary to gum surgery and decreased food intake.  Currently appetite is better and she has gained 2 pounds. I will continue watchful waiting.  # Tobacco abuse,  More than 30 pack year smoking history, cessation was discussed with patient. Refer to lung cancer screening program.  Refer to lung cancer screening program.  # High BP, she is asymptomatic and reports taking her medication I recommend patient to discuss with primary care provider.  She probably need adjustment of her BP medication.  All questions were answered. The patient knows to call the clinic with any problems questions or concerns. Orders Placed This Encounter  Procedures  . CBC with Differential/Platelet    Standing Status:   Future    Standing Expiration Date:   06/23/2021  . Comprehensive metabolic panel    Standing Status:   Future     Standing Expiration Date:   06/23/2021  . Lactate dehydrogenase    Standing Status:   Future    Standing Expiration Date:   06/23/2021  . Ambulatory Referral for Lung Cancer Screening    Referral Priority:   Routine    Referral Type:   Consultation    Referral Reason:   Specialty Services Required    Number of Visits Requested:   1    Return of visit  4 months Earlie Server, MD, PhD Hematology Oncology [place] at [facility] 06/23/2020

## 2020-07-02 ENCOUNTER — Telehealth: Payer: Self-pay | Admitting: *Deleted

## 2020-07-02 DIAGNOSIS — Z87891 Personal history of nicotine dependence: Secondary | ICD-10-CM

## 2020-07-02 DIAGNOSIS — Z122 Encounter for screening for malignant neoplasm of respiratory organs: Secondary | ICD-10-CM

## 2020-07-02 NOTE — Telephone Encounter (Signed)
Received referral for initial lung cancer screening scan. Contacted patient and obtained smoking history,(current, 35 pack year) as well as answering questions related to screening process. Patient denies signs of lung cancer such as weight loss or hemoptysis. Patient denies comorbidity that would prevent curative treatment if lung cancer were found. Patient is scheduled for shared decision making visit and CT scan on 07/07/20 at 1015am.

## 2020-07-07 ENCOUNTER — Inpatient Hospital Stay: Payer: Medicare Other | Admitting: Hospice and Palliative Medicine

## 2020-07-07 ENCOUNTER — Encounter: Payer: Self-pay | Admitting: Hospice and Palliative Medicine

## 2020-07-07 ENCOUNTER — Ambulatory Visit: Admission: RE | Admit: 2020-07-07 | Payer: Medicare Other | Source: Ambulatory Visit

## 2020-07-31 ENCOUNTER — Emergency Department
Admission: EM | Admit: 2020-07-31 | Discharge: 2020-08-01 | Disposition: A | Discharge status: Home / Self Care | Payer: Medicare Other | Attending: Emergency Medicine | Admitting: Emergency Medicine

## 2020-07-31 ENCOUNTER — Other Ambulatory Visit: Payer: Self-pay

## 2020-07-31 DIAGNOSIS — Z5321 Procedure and treatment not carried out due to patient leaving prior to being seen by health care provider: Secondary | ICD-10-CM | POA: Diagnosis not present

## 2020-07-31 DIAGNOSIS — Z1152 Encounter for screening for COVID-19: Secondary | ICD-10-CM | POA: Diagnosis not present

## 2020-07-31 NOTE — ED Triage Notes (Signed)
Exposure to covid pos person. No symptoms.

## 2020-08-07 ENCOUNTER — Observation Stay: Payer: Medicare Other

## 2020-08-07 ENCOUNTER — Encounter: Payer: Self-pay | Admitting: Intensive Care

## 2020-08-07 ENCOUNTER — Inpatient Hospital Stay
Admission: EM | Admit: 2020-08-07 | Discharge: 2020-08-12 | DRG: 640 | Disposition: A | Discharge status: Home Health | Payer: Medicare Other | Attending: Hospitalist | Admitting: Hospitalist

## 2020-08-07 ENCOUNTER — Emergency Department: Payer: Medicare Other

## 2020-08-07 ENCOUNTER — Other Ambulatory Visit: Payer: Self-pay

## 2020-08-07 DIAGNOSIS — F419 Anxiety disorder, unspecified: Secondary | ICD-10-CM | POA: Diagnosis present

## 2020-08-07 DIAGNOSIS — Z801 Family history of malignant neoplasm of trachea, bronchus and lung: Secondary | ICD-10-CM

## 2020-08-07 DIAGNOSIS — I2510 Atherosclerotic heart disease of native coronary artery without angina pectoris: Secondary | ICD-10-CM | POA: Diagnosis present

## 2020-08-07 DIAGNOSIS — I252 Old myocardial infarction: Secondary | ICD-10-CM

## 2020-08-07 DIAGNOSIS — N179 Acute kidney failure, unspecified: Secondary | ICD-10-CM | POA: Diagnosis present

## 2020-08-07 DIAGNOSIS — E871 Hypo-osmolality and hyponatremia: Secondary | ICD-10-CM | POA: Diagnosis not present

## 2020-08-07 DIAGNOSIS — Z79899 Other long term (current) drug therapy: Secondary | ICD-10-CM

## 2020-08-07 DIAGNOSIS — Z8249 Family history of ischemic heart disease and other diseases of the circulatory system: Secondary | ICD-10-CM

## 2020-08-07 DIAGNOSIS — I639 Cerebral infarction, unspecified: Secondary | ICD-10-CM

## 2020-08-07 DIAGNOSIS — I6381 Other cerebral infarction due to occlusion or stenosis of small artery: Secondary | ICD-10-CM | POA: Diagnosis present

## 2020-08-07 DIAGNOSIS — Z7982 Long term (current) use of aspirin: Secondary | ICD-10-CM

## 2020-08-07 DIAGNOSIS — Z823 Family history of stroke: Secondary | ICD-10-CM

## 2020-08-07 DIAGNOSIS — C9110 Chronic lymphocytic leukemia of B-cell type not having achieved remission: Secondary | ICD-10-CM | POA: Diagnosis not present

## 2020-08-07 DIAGNOSIS — Z961 Presence of intraocular lens: Secondary | ICD-10-CM | POA: Diagnosis present

## 2020-08-07 DIAGNOSIS — Z9841 Cataract extraction status, right eye: Secondary | ICD-10-CM

## 2020-08-07 DIAGNOSIS — E7800 Pure hypercholesterolemia, unspecified: Secondary | ICD-10-CM | POA: Diagnosis present

## 2020-08-07 DIAGNOSIS — Z7902 Long term (current) use of antithrombotics/antiplatelets: Secondary | ICD-10-CM

## 2020-08-07 DIAGNOSIS — E039 Hypothyroidism, unspecified: Secondary | ICD-10-CM | POA: Diagnosis present

## 2020-08-07 DIAGNOSIS — U071 COVID-19: Secondary | ICD-10-CM | POA: Diagnosis present

## 2020-08-07 DIAGNOSIS — Z7951 Long term (current) use of inhaled steroids: Secondary | ICD-10-CM

## 2020-08-07 DIAGNOSIS — I10 Essential (primary) hypertension: Secondary | ICD-10-CM | POA: Diagnosis present

## 2020-08-07 DIAGNOSIS — R432 Parageusia: Secondary | ICD-10-CM | POA: Diagnosis not present

## 2020-08-07 DIAGNOSIS — F17210 Nicotine dependence, cigarettes, uncomplicated: Secondary | ICD-10-CM | POA: Diagnosis present

## 2020-08-07 DIAGNOSIS — G2581 Restless legs syndrome: Secondary | ICD-10-CM | POA: Diagnosis present

## 2020-08-07 DIAGNOSIS — Z955 Presence of coronary angioplasty implant and graft: Secondary | ICD-10-CM

## 2020-08-07 DIAGNOSIS — K219 Gastro-esophageal reflux disease without esophagitis: Secondary | ICD-10-CM | POA: Diagnosis present

## 2020-08-07 DIAGNOSIS — G4733 Obstructive sleep apnea (adult) (pediatric): Secondary | ICD-10-CM | POA: Diagnosis present

## 2020-08-07 DIAGNOSIS — R531 Weakness: Secondary | ICD-10-CM

## 2020-08-07 DIAGNOSIS — E785 Hyperlipidemia, unspecified: Secondary | ICD-10-CM | POA: Diagnosis present

## 2020-08-07 DIAGNOSIS — F32A Depression, unspecified: Secondary | ICD-10-CM | POA: Diagnosis present

## 2020-08-07 DIAGNOSIS — Z888 Allergy status to other drugs, medicaments and biological substances status: Secondary | ICD-10-CM

## 2020-08-07 DIAGNOSIS — Z85828 Personal history of other malignant neoplasm of skin: Secondary | ICD-10-CM

## 2020-08-07 DIAGNOSIS — Z9842 Cataract extraction status, left eye: Secondary | ICD-10-CM

## 2020-08-07 LAB — URINALYSIS, COMPLETE (UACMP) WITH MICROSCOPIC
Bacteria, UA: NONE SEEN
Bilirubin Urine: NEGATIVE
Glucose, UA: NEGATIVE mg/dL
Hgb urine dipstick: NEGATIVE
Ketones, ur: 5 mg/dL — AB
Leukocytes,Ua: NEGATIVE
Nitrite: NEGATIVE
Protein, ur: NEGATIVE mg/dL
Specific Gravity, Urine: 1.009 (ref 1.005–1.030)
Squamous Epithelial / HPF: NONE SEEN (ref 0–5)
pH: 6 (ref 5.0–8.0)

## 2020-08-07 LAB — BASIC METABOLIC PANEL
Anion gap: 10 (ref 5–15)
BUN: 28 mg/dL — ABNORMAL HIGH (ref 8–23)
CO2: 25 mmol/L (ref 22–32)
Calcium: 10 mg/dL (ref 8.9–10.3)
Chloride: 86 mmol/L — ABNORMAL LOW (ref 98–111)
Creatinine, Ser: 1.33 mg/dL — ABNORMAL HIGH (ref 0.44–1.00)
GFR, Estimated: 42 mL/min — ABNORMAL LOW (ref >60)
Glucose, Bld: 95 mg/dL (ref 70–99)
Potassium: 4.3 mmol/L (ref 3.5–5.1)
Sodium: 121 mmol/L — ABNORMAL LOW (ref 135–145)

## 2020-08-07 LAB — CBC WITH DIFFERENTIAL/PLATELET
Abs Immature Granulocytes: 0.05 10*3/uL (ref 0.00–0.07)
Basophils Absolute: 0.1 10*3/uL (ref 0.0–0.1)
Basophils Relative: 0 %
Eosinophils Absolute: 0.1 10*3/uL (ref 0.0–0.5)
Eosinophils Relative: 0 %
HCT: 35.4 % — ABNORMAL LOW (ref 36.0–46.0)
Hemoglobin: 12.3 g/dL (ref 12.0–15.0)
Immature Granulocytes: 0 %
Lymphocytes Relative: 87 %
Lymphs Abs: 33.9 10*3/uL — ABNORMAL HIGH (ref 0.7–4.0)
MCH: 30.8 pg (ref 26.0–34.0)
MCHC: 34.7 g/dL (ref 30.0–36.0)
MCV: 88.7 fL (ref 80.0–100.0)
Monocytes Absolute: 2 10*3/uL — ABNORMAL HIGH (ref 0.1–1.0)
Monocytes Relative: 5 %
Neutro Abs: 3 10*3/uL (ref 1.7–7.7)
Neutrophils Relative %: 8 %
Platelets: 215 10*3/uL (ref 150–400)
RBC: 3.99 MIL/uL (ref 3.87–5.11)
RDW: 12.5 % (ref 11.5–15.5)
Smear Review: ADEQUATE
WBC: 39 10*3/uL — ABNORMAL HIGH (ref 4.0–10.5)
nRBC: 0 % (ref 0.0–0.2)

## 2020-08-07 LAB — CBC
HCT: 35.7 % — ABNORMAL LOW (ref 36.0–46.0)
Hemoglobin: 12.3 g/dL (ref 12.0–15.0)
MCH: 30.7 pg (ref 26.0–34.0)
MCHC: 34.5 g/dL (ref 30.0–36.0)
MCV: 89 fL (ref 80.0–100.0)
Platelets: 208 10*3/uL (ref 150–400)
RBC: 4.01 MIL/uL (ref 3.87–5.11)
RDW: 12.6 % (ref 11.5–15.5)
WBC: 39.2 10*3/uL — ABNORMAL HIGH (ref 4.0–10.5)
nRBC: 0 % (ref 0.0–0.2)

## 2020-08-07 LAB — HEMOGLOBIN A1C
Hgb A1c MFr Bld: 6.2 % — ABNORMAL HIGH (ref 4.8–5.6)
Mean Plasma Glucose: 131.24 mg/dL

## 2020-08-07 MED ORDER — PANTOPRAZOLE SODIUM 40 MG PO TBEC
40.0000 mg | DELAYED_RELEASE_TABLET | Freq: Every day | ORAL | Status: DC
  Administered 2020-08-07 – 2020-08-12 (×6): 40 mg via ORAL
  Filled 2020-08-07 (×6): qty 1

## 2020-08-07 MED ORDER — SENNA 8.6 MG PO TABS
1.0000 | ORAL_TABLET | Freq: Two times a day (BID) | ORAL | Status: DC
  Administered 2020-08-07 – 2020-08-12 (×9): 8.6 mg via ORAL
  Filled 2020-08-07 (×9): qty 1

## 2020-08-07 MED ORDER — AMIODARONE HCL 200 MG PO TABS
200.0000 mg | ORAL_TABLET | Freq: Every day | ORAL | Status: DC
  Administered 2020-08-08 – 2020-08-12 (×5): 200 mg via ORAL
  Filled 2020-08-07 (×7): qty 1

## 2020-08-07 MED ORDER — ENOXAPARIN SODIUM 40 MG/0.4ML ~~LOC~~ SOLN
40.0000 mg | SUBCUTANEOUS | Status: DC
  Administered 2020-08-08 – 2020-08-12 (×5): 40 mg via SUBCUTANEOUS
  Filled 2020-08-07 (×5): qty 0.4

## 2020-08-07 MED ORDER — POTASSIUM CHLORIDE CRYS ER 10 MEQ PO TBCR
10.0000 meq | EXTENDED_RELEASE_TABLET | Freq: Every day | ORAL | Status: DC
  Administered 2020-08-07 – 2020-08-12 (×6): 10 meq via ORAL
  Filled 2020-08-07 (×6): qty 1

## 2020-08-07 MED ORDER — SODIUM CHLORIDE 0.9 % IV SOLN: Freq: Once | INTRAVENOUS | Status: AC

## 2020-08-07 MED ORDER — ACETAMINOPHEN 650 MG RE SUPP: 650.0000 mg | Freq: Four times a day (QID) | RECTAL | Status: DC | PRN

## 2020-08-07 MED ORDER — CLOPIDOGREL BISULFATE 75 MG PO TABS
75.0000 mg | ORAL_TABLET | Freq: Every day | ORAL | Status: DC
Start: 2020-08-07 — End: 2020-08-12
  Administered 2020-08-07 – 2020-08-12 (×6): 75 mg via ORAL
  Filled 2020-08-07 (×6): qty 1

## 2020-08-07 MED ORDER — HYDRALAZINE HCL 50 MG PO TABS
25.0000 mg | ORAL_TABLET | Freq: Every day | ORAL | Status: DC
  Administered 2020-08-07 – 2020-08-12 (×6): 25 mg via ORAL
  Filled 2020-08-07 (×6): qty 1

## 2020-08-07 MED ORDER — HYDROCODONE-ACETAMINOPHEN 7.5-325 MG PO TABS: 1.0000 | ORAL_TABLET | Freq: Four times a day (QID) | ORAL | Status: DC | PRN

## 2020-08-07 MED ORDER — LORATADINE 10 MG PO TABS
10.0000 mg | ORAL_TABLET | Freq: Every day | ORAL | Status: DC
  Administered 2020-08-07 – 2020-08-12 (×6): 10 mg via ORAL
  Filled 2020-08-07 (×6): qty 1

## 2020-08-07 MED ORDER — MOMETASONE FURO-FORMOTEROL FUM 200-5 MCG/ACT IN AERO
2.0000 | INHALATION_SPRAY | Freq: Two times a day (BID) | RESPIRATORY_TRACT | Status: DC
  Administered 2020-08-08 – 2020-08-12 (×7): 2 via RESPIRATORY_TRACT
  Filled 2020-08-07 (×3): qty 8.8

## 2020-08-07 MED ORDER — BUPROPION HCL ER (XL) 150 MG PO TB24
150.0000 mg | ORAL_TABLET | Freq: Every day | ORAL | Status: DC
  Administered 2020-08-07 – 2020-08-11 (×5): 150 mg via ORAL
  Filled 2020-08-07 (×5): qty 1

## 2020-08-07 MED ORDER — SODIUM CHLORIDE 0.9 % IV SOLN: INTRAVENOUS | Status: AC

## 2020-08-07 MED ORDER — ASPIRIN EC 81 MG PO TBEC
81.0000 mg | DELAYED_RELEASE_TABLET | Freq: Every day | ORAL | Status: DC
  Administered 2020-08-07 – 2020-08-12 (×6): 81 mg via ORAL
  Filled 2020-08-07 (×6): qty 1

## 2020-08-07 MED ORDER — ACETAMINOPHEN 325 MG PO TABS: 650.0000 mg | ORAL_TABLET | Freq: Four times a day (QID) | ORAL | Status: DC | PRN

## 2020-08-07 MED ORDER — GABAPENTIN 300 MG PO CAPS
400.0000 mg | ORAL_CAPSULE | Freq: Two times a day (BID) | ORAL | Status: DC
  Administered 2020-08-07 – 2020-08-12 (×9): 400 mg via ORAL
  Filled 2020-08-07 (×2): qty 1
  Filled 2020-08-07: qty 4
  Filled 2020-08-07 (×6): qty 1

## 2020-08-07 MED ORDER — LISINOPRIL 20 MG PO TABS
40.0000 mg | ORAL_TABLET | Freq: Every day | ORAL | Status: DC
  Administered 2020-08-07 – 2020-08-11 (×5): 40 mg via ORAL
  Filled 2020-08-07: qty 2
  Filled 2020-08-07: qty 4
  Filled 2020-08-07 (×2): qty 2
  Filled 2020-08-07: qty 4

## 2020-08-07 MED ORDER — FLUTICASONE PROPIONATE 50 MCG/ACT NA SUSP
1.0000 | Freq: Every day | NASAL | Status: DC | PRN
  Filled 2020-08-07: qty 16

## 2020-08-07 MED ORDER — ISOSORBIDE MONONITRATE ER 30 MG PO TB24
60.0000 mg | ORAL_TABLET | Freq: Every day | ORAL | Status: DC
  Administered 2020-08-07 – 2020-08-12 (×6): 60 mg via ORAL
  Filled 2020-08-07: qty 2
  Filled 2020-08-07: qty 1
  Filled 2020-08-07 (×2): qty 2
  Filled 2020-08-07: qty 1
  Filled 2020-08-07: qty 2

## 2020-08-07 MED ORDER — TRAZODONE HCL 50 MG PO TABS
25.0000 mg | ORAL_TABLET | Freq: Every evening | ORAL | Status: DC | PRN
  Administered 2020-08-09 – 2020-08-10 (×2): 25 mg via ORAL
  Filled 2020-08-07 (×3): qty 1

## 2020-08-07 MED ORDER — ALBUTEROL SULFATE HFA 108 (90 BASE) MCG/ACT IN AERS
2.0000 | INHALATION_SPRAY | RESPIRATORY_TRACT | Status: DC | PRN
  Filled 2020-08-07: qty 6.7

## 2020-08-07 MED ORDER — METOPROLOL SUCCINATE ER 25 MG PO TB24
25.0000 mg | ORAL_TABLET | Freq: Every day | ORAL | Status: DC
  Administered 2020-08-08 – 2020-08-12 (×5): 25 mg via ORAL
  Filled 2020-08-07 (×6): qty 1

## 2020-08-07 MED ORDER — DULOXETINE HCL 30 MG PO CPEP
60.0000 mg | ORAL_CAPSULE | Freq: Every day | ORAL | Status: DC
Start: 2020-08-07 — End: 2020-08-12
  Administered 2020-08-07 – 2020-08-12 (×6): 60 mg via ORAL
  Filled 2020-08-07: qty 2
  Filled 2020-08-07: qty 1
  Filled 2020-08-07 (×3): qty 2
  Filled 2020-08-07: qty 1

## 2020-08-07 NOTE — ED Provider Notes (Addendum)
[facility] Emergency Department Provider Note   ____________________________________________   Event Date/Time   First MD Initiated Contact with Patient 08/07/20 1757     (approximate)  I have reviewed the triage vital signs and the nursing notes.   HISTORY  Chief Complaint Weakness and Fatigue    HPI Karen [name] is a 75 y.o. female who comes in complaining of weakness and fatigue.  Additionally she says everything tastes sweet.  This includes food and liquids.  She is not coughing or sneezing.  She is not running a fever.  She is not hypoxic but she does not feel well at all.  Review of her lab work shows that her sodium is 121 her glucose is normal her renal function has worsened considerably and her GFR is now 42.  Additionally her white count has been climbing over the last year and is now up to 39.2.  In December of last year it was 25 and in the beginning of last year it was 45.  Patient has CLL but potentially she is going to convert to a more acute form.  Differential is still pending at this time.         Past Medical History:  Diagnosis Date  . Anxiety   . Artery occlusion   . Asthma   . Back pain   . Bronchitis   . Cancer ([place])    Skin Cancer  . CLL (chronic lymphocytic leukemia) ([place])   . Coronary artery disease   . Depression   . Dyspnea   . GERD (gastroesophageal reflux disease)   . History of kidney stones   . Hypercholesteremia   . Hyperlipemia   . Hypertension   . Hypothyroidism   . Myocardial infarction ([place]) 1999  . Neuropathy   . Restless leg syndrome   . Sleep apnea    use C-PAP  . Wears dentures    full upper, partial lower.  doesn't currently wear    Patient Active Problem List   Diagnosis Date Noted  . CLL (chronic lymphocytic leukemia) ([place]) 12/18/2017  . Unstable angina ([place]) 06/22/2017  . Inclusion cyst of vulva 12/14/2016  . Lumbar spondylosis 08/14/2016  . Trochanteric bursitis of right hip  08/14/2016  . Herpes simplex virus (HSV) infection 12/09/2015  . History of total vaginal hysterectomy (TVH) 11/25/2015  . Vaginal odor 11/25/2015  . History of UTI 11/25/2015  . Vaginal atrophy 11/25/2015  . Dyspareunia in female 11/25/2015  . Tendinitis of wrist 05/26/2015    Past Surgical History:  Procedure Laterality Date  . COLONOSCOPY WITH PROPOFOL N/A 11/20/2016   Procedure: COLONOSCOPY WITH PROPOFOL;  Surgeon: Lucilla Lame, MD;  Location: [place];  Service: Endoscopy;  Laterality: N/A;  sleep apnea  . CORONARY ANGIOPLASTY WITH STENT PLACEMENT  2008  . DILATION AND CURETTAGE OF UTERUS    . EYE SURGERY Bilateral    Cataract Extraction with IOL  . HAND SURGERY Left 12/2017   upper hand fracture repair  . LEFT HEART CATH AND CORONARY ANGIOGRAPHY N/A 06/29/2017   Procedure: LEFT HEART CATH AND CORONARY ANGIOGRAPHY;  Surgeon: Dionisio David, MD;  Location: [place] [facility];  Service: Cardiovascular;  Laterality: N/A;  . LEFT HEART CATH AND CORONARY ANGIOGRAPHY Left 12/02/2018   Procedure: LEFT HEART CATH AND CORONARY ANGIOGRAPHY;  Surgeon: Dionisio David, MD;  Location: [facility] [facility];  Service: Cardiovascular;  Laterality: Left;  . OPEN REDUCTION INTERNAL FIXATION (ORIF) DISTAL RADIAL FRACTURE Right 08/12/2018  Procedure: OPEN REDUCTION INTERNAL FIXATION (ORIF) DISTAL RADIAL FRACTURE;  Surgeon: [name], MD;  Location: [facility];  Service: Orthopedics;  Laterality: Right;  . POLYPECTOMY  11/20/2016   Procedure: POLYPECTOMY;  Surgeon: Lucilla Lame, MD;  Location: [place];  Service: Endoscopy;;  . TUBAL LIGATION    . VAGINAL HYSTERECTOMY    . VULVECTOMY Right 01/31/2016   Procedure: WIDE EXCISION VULVECTOMY-RIGHT LABIA;  Surgeon: Brayton Mars, MD;  Location: [facility];  Service: Gynecology;  Laterality: Right;    Prior to Admission medications   Medication Sig Start Date End Date Taking? Authorizing Provider  acetaminophen (TYLENOL) 500  MG tablet Take 1,000 mg by mouth every 6 (six) hours as needed for moderate pain or headache. Patient not taking: Reported on 06/23/2020    [provider]  albuterol (PROVENTIL) (2.5 MG/3ML) 0.083% nebulizer solution Take 3 mLs (2.5 mg total) by nebulization every 4 (four) hours as needed for wheezing or shortness of breath. 05/09/18   Paulette Blanch, MD  alendronate (FOSAMAX) 70 MG tablet Take 70 mg by mouth every Wednesday.     [provider]  amiodarone (PACERONE) 200 MG tablet Take 200 mg by mouth daily.    [provider]  aspirin EC 81 MG tablet Take 81 mg by mouth daily.    [provider]  budesonide-formoterol (SYMBICORT) 160-4.5 MCG/ACT inhaler Inhale 2 puffs into the lungs 2 (two) times daily as needed (shortness of breath).    [provider]  buPROPion (WELLBUTRIN XL) 150 MG 24 hr tablet Take 150 mg by mouth at bedtime. 05/08/20   [provider]  cholecalciferol (VITAMIN D3) 25 MCG (1000 UT) tablet Take 1,000 Units by mouth daily.    [provider]  clopidogrel (PLAVIX) 75 MG tablet Take 75 mg by mouth daily.     [provider]  conjugated estrogens (PREMARIN) vaginal cream 1 gram intravaginal BIW 10/21/19   Harlin Heys, MD  dexlansoprazole (DEXILANT) 60 MG capsule Take 60 mg by mouth daily.     [provider]  DULoxetine (CYMBALTA) 60 MG capsule Take 60 mg by mouth daily.    [provider]  fluticasone (FLONASE) 50 MCG/ACT nasal spray Place 1 spray into both nostrils daily as needed for allergies or rhinitis.    [provider]  furosemide (LASIX) 20 MG tablet Take 20 mg by mouth daily. Patient not taking: Reported on 06/23/2020 12/20/17   [provider]  gabapentin (NEURONTIN) 400 MG capsule Take 400 mg by mouth 2 (two) times daily.     [provider]  hydrALAZINE (APRESOLINE) 25 MG tablet Take 25 mg by mouth daily.    [provider]   HYDROcodone-acetaminophen (NORCO) 7.5-325 MG tablet Take 1-2 tablets by mouth every 6 (six) hours as needed for moderate pain. 08/12/18   [name], MD  isosorbide mononitrate (IMDUR) 60 MG 24 hr tablet Take 60 mg by mouth daily.    [provider]  lisinopril (PRINIVIL,ZESTRIL) 40 MG tablet Take 40 mg by mouth daily.    [provider]  loratadine (CLARITIN) 10 MG tablet Take 10 mg by mouth daily. 03/15/20   [provider]  meclizine (ANTIVERT) 25 MG tablet Take by mouth. 01/27/20   [provider]  metoprolol succinate (TOPROL-XL) 50 MG 24 hr tablet Take 25 mg by mouth daily.     [provider]  Nebulizers (COMPRESSOR/NEBULIZER) MISC 1 Units by Does not apply route every 4 (four) hours as  needed. 05/09/18   Paulette Blanch, MD  nitroGLYCERIN (NITROSTAT) 0.4 MG SL tablet Place 0.4 mg under the tongue every 5 (five) minutes as needed for chest pain.  Patient not taking: Reported on 03/24/2020    [provider]  potassium chloride (K-DUR,KLOR-CON) 10 MEQ tablet Take 10 mEq by mouth daily.    [provider]  PREMARIN vaginal cream PLACE 5.80 (9/9IP) APPLICATORFUL VAGINALLY 2 (TWO) TIMES A WEEK. Patient not taking: Reported on 03/24/2020 04/17/18   Defrancesco, Alanda Slim, MD  REPATHA 140 MG/ML SOSY USE AS DIRECTED, TWICE A MONTH 09/08/19   [provider]  simvastatin (ZOCOR) 80 MG tablet Take 80 mg by mouth daily. Patient not taking: Reported on 06/23/2020    [provider]  Tetrahydrozoline HCl (VISINE OP) Place 1 drop into both eyes daily as needed (dry eyes).    [provider]  VIIBRYD 20 MG TABS 10 mg for 7 days then increase to 20mg . Patient not taking: Reported on 03/24/2020 02/07/19   [provider]    Allergies Requip [ropinirole hcl], Celebrex [celecoxib], Meloxicam, and Nitrofurantoin  Family History  Problem Relation Age of Onset  . Heart disease Father   . Stroke Mother   . Hypertension  Mother   . Lung cancer Brother   . Heart attack Brother   . Congestive Heart Failure Sister   . Cervical cancer Sister   . Cancer Neg Hx   . Diabetes Neg Hx   . Breast cancer Neg Hx     Social History Social History   Tobacco Use  . Smoking status: Current Every Day Smoker    Packs/day: 1.00    Years: 35.00    Pack years: 35.00    Types: Cigarettes  . Smokeless tobacco: Current User    Types: Snuff  Vaping Use  . Vaping Use: Never used  Substance Use Topics  . Alcohol use: No    Alcohol/week: 0.0 standard drinks  . Drug use: No    Review of Systems  Constitutional: No fever/chills Eyes: No visual changes. ENT: No sore throat. Cardiovascular: Denies chest pain. Respiratory: Denies shortness of breath. Gastrointestinal: No abdominal pain.  No nausea, no vomiting.  No diarrhea.  No constipation. Genitourinary: Negative for dysuria. Musculoskeletal: Negative for back pain. Skin: Negative for rash. Neurological: Negative for headaches, focal weakness   ____________________________________________   PHYSICAL EXAM:  VITAL SIGNS: ED Triage Vitals  Enc Vitals Group     BP 08/07/20 1540 106/64     Pulse Rate 08/07/20 1540 (!) 55     Resp 08/07/20 1540 16     Temp 08/07/20 1540 98.2 F (36.8 C)     Temp Source 08/07/20 1540 Oral     SpO2 08/07/20 1540 97 %     Weight 08/07/20 1536 152 lb (68.9 kg)     Height 08/07/20 1536 5\' 2"  (1.575 m)     Head Circumference --      Peak Flow --      Pain Score 08/07/20 1536 5     Pain Loc --      Pain Edu? --      Excl. in [place]? --     Constitutional: Alert and oriented.  Patient looks tired and worn out Eyes: Conjunctivae are normal.  Head: Atraumatic. Nose: No congestion/rhinnorhea. Mouth/Throat: Mucous membranes are moist.  Oropharynx non-erythematous. Neck: No stridor.  Cardiovascular: Normal rate, regular rhythm. Grossly normal heart sounds.  Good peripheral circulation. Respiratory: Normal respiratory effort.  No  retractions. Lungs CTAB. Gastrointestinal: Soft and nontender. No distention. No abdominal bruits. Musculoskeletal: No lower extremity tenderness nor edema.   Neurologic:  Normal speech and language. No gross focal neurologic deficits are appreciated.  Skin:  Skin is warm, dry and intact. No rash noted.   ____________________________________________   LABS (all labs ordered are listed, but only abnormal results are displayed)  Labs Reviewed  BASIC METABOLIC PANEL - Abnormal; Notable for the following components:      Result Value   Sodium 121 (*)    Chloride 86 (*)    BUN 28 (*)    Creatinine, Ser 1.33 (*)    GFR, Estimated 42 (*)    All other components within normal limits  CBC - Abnormal; Notable for the following components:   WBC 39.2 (*)    HCT 35.7 (*)    All other components within normal limits  SARS CORONAVIRUS 2 (TAT 6-24 HRS)  URINALYSIS, COMPLETE (UACMP) WITH MICROSCOPIC  DIFFERENTIAL   ____________________________________________  EKG  EKG read interpreted by me shows sinus bradycardia rate of 54 normal axis no acute ST-T changes ____________________________________________  RADIOLOGY Gertha Calkin, personally viewed and evaluated these images (plain radiographs) as part of my medical decision making, as well as reviewing the written report by the radiologist.  ED MD interpretation: Chest x-ray read by radiology reviewed by me is negative  Official radiology report(s): No results found.  ____________________________________________   PROCEDURES  Procedure(s) performed (including Critical Care):  Procedures   ____________________________________________   INITIAL IMPRESSION / ASSESSMENT AND PLAN / ED COURSE  Differential and chest x-ray are still pending at this time.  Patient does however have acute kidney injury and a rising white count with hyponatremia.  We will have to get her in the hospital and evaluate the causes of the rising  white count she may be going undergoing an acute conversion.  Additionally we will have to work on her sodium is getting down to dangerous levels.  She may just be getting dehydrated.  I will start her on some saline.     ----------------------------------------- 7:15 PM on 08/07/2020 -----------------------------------------  Daughter now arrives and wants to know what is going on.  I will go explain what is going on to her.         ____________________________________________   FINAL CLINICAL IMPRESSION(S) / ED DIAGNOSES  Final diagnoses:  Weakness  AKI (acute kidney injury) ([place])  Hyponatremia     ED Discharge Orders    None      *Please note:  Karen [name] was evaluated in Emergency Department on 08/07/2020 for the symptoms described in the history of present illness. She was evaluated in the context of the global COVID-19 pandemic, which necessitated consideration that the patient might be at risk for infection with the SARS-CoV-2 virus that causes COVID-19. Institutional protocols and algorithms that pertain to the evaluation of patients at risk for COVID-19 are in a state of rapid change based on information released by regulatory bodies including the CDC and federal and state organizations. These policies and algorithms were followed during the patient's care in the ED.  Some ED evaluations and interventions may be delayed as a result of limited staffing during and the pandemic.*   Note:  This document was prepared using Dragon voice recognition software and may include unintentional dictation errors.    Nena Polio, MD 08/07/20 1811    Nena Polio, MD 08/07/20 [id number]

## 2020-08-07 NOTE — ED Notes (Signed)
ED provider at bedside giving pt's daughter update at bedside at this time.

## 2020-08-07 NOTE — H&P (Signed)
History and Physical    Karen [name] WEX:[id number] DOB: 1945/10/05 DOA: 08/07/2020  PCP: Perrin Maltese, MD (Confirm with patient/family/NH records and if not entered, this has to be entered at [facility] point of entry) Patient coming from: home  I have personally briefly reviewed patient's old medical records in [place]  Chief Complaint: weakness/fatigue,change in taste  HPI: Karen [name] is a 75 y.o. female with medical history significant of CLL, CAD, HLD, HTN, hypothyroidism, RLS, OSA. For several days she reports that everything tastes too sweet and she has not been able to eat or drink regular fluids due to associated nausea w/o emesis. She does report that she has been drinking large amounts of water. Due to her symptoms she presents to [facility] for evaulation.  ED Course: T 98.2  130/64 HR 55  RR 16. EDP exam unremarkable. Lab reveals Na 121, Cr 1.33, WBC 39 with 87% lymphocytes and smudge cells. CXR NAD. She is referred to [facility] for admission and continued treatment.   Review of Systems: As per HPI otherwise 10 point review of systems negative.    Past Medical History:  Diagnosis Date  . Anxiety   . Artery occlusion   . Asthma   . Back pain   . Bronchitis   . Cancer ([place])    Skin Cancer  . CLL (chronic lymphocytic leukemia) ([place])   . Coronary artery disease   . Depression   . Dyspnea   . GERD (gastroesophageal reflux disease)   . History of kidney stones   . Hypercholesteremia   . Hyperlipemia   . Hypertension   . Hypothyroidism   . Myocardial infarction ([place]) 1999  . Neuropathy   . Restless leg syndrome   . Sleep apnea    use C-PAP  . Wears dentures    full upper, partial lower.  doesn't currently wear    Past Surgical History:  Procedure Laterality Date  . COLONOSCOPY WITH PROPOFOL N/A 11/20/2016   Procedure: COLONOSCOPY WITH PROPOFOL;  Surgeon: Lucilla Lame, MD;  Location: [place];  Service: Endoscopy;  Laterality: N/A;  sleep apnea  .  CORONARY ANGIOPLASTY WITH STENT PLACEMENT  2008  . DILATION AND CURETTAGE OF UTERUS    . EYE SURGERY Bilateral    Cataract Extraction with IOL  . HAND SURGERY Left 12/2017   upper hand fracture repair  . LEFT HEART CATH AND CORONARY ANGIOGRAPHY N/A 06/29/2017   Procedure: LEFT HEART CATH AND CORONARY ANGIOGRAPHY;  Surgeon: Dionisio David, MD;  Location: [place] [facility];  Service: Cardiovascular;  Laterality: N/A;  . LEFT HEART CATH AND CORONARY ANGIOGRAPHY Left 12/02/2018   Procedure: LEFT HEART CATH AND CORONARY ANGIOGRAPHY;  Surgeon: Dionisio David, MD;  Location: [place] [facility];  Service: Cardiovascular;  Laterality: Left;  . OPEN REDUCTION INTERNAL FIXATION (ORIF) DISTAL RADIAL FRACTURE Right 08/12/2018   Procedure: OPEN REDUCTION INTERNAL FIXATION (ORIF) DISTAL RADIAL FRACTURE;  Surgeon: [name], MD;  Location: [facility];  Service: Orthopedics;  Laterality: Right;  . POLYPECTOMY  11/20/2016   Procedure: POLYPECTOMY;  Surgeon: Lucilla Lame, MD;  Location: [place];  Service: Endoscopy;;  . TUBAL LIGATION    . VAGINAL HYSTERECTOMY    . VULVECTOMY Right 01/31/2016   Procedure: WIDE EXCISION VULVECTOMY-RIGHT LABIA;  Surgeon: Brayton Mars, MD;  Location: [facility];  Service: Gynecology;  Laterality: Right;   Soc Hx - She was married for many years but divorced. She live with her boyfriend of 8 years  and her daughter. She had 5 children. 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    reports that she has been smoking cigarettes. She has a 35.00 pack-year smoking history. Her smokeless tobacco use includes snuff. She reports that she does not drink alcohol and does not use drugs.  Allergies  Allergen Reactions  . Requip [Ropinirole Hcl] Anaphylaxis    Throat closing  . Celebrex [Celecoxib] Itching  . Meloxicam Palpitations  . Nitrofurantoin Palpitations    Family History  Problem Relation Age of Onset  . Heart disease Father   . Stroke Mother   .  Hypertension Mother   . Lung cancer Brother   . Heart attack Brother   . Congestive Heart Failure Sister   . Cervical cancer Sister   . Cancer Neg Hx   . Diabetes Neg Hx   . Breast cancer Neg Hx      Prior to Admission medications   Medication Sig Start Date End Date Taking? Authorizing Provider  acetaminophen (TYLENOL) 500 MG tablet Take 1,000 mg by mouth every 6 (six) hours as needed for moderate pain or headache. Patient not taking: Reported on 06/23/2020    [provider]  albuterol (PROVENTIL) (2.5 MG/3ML) 0.083% nebulizer solution Take 3 mLs (2.5 mg total) by nebulization every 4 (four) hours as needed for wheezing or shortness of breath. 05/09/18   Paulette Blanch, MD  alendronate (FOSAMAX) 70 MG tablet Take 70 mg by mouth every Wednesday.     [provider]  amiodarone (PACERONE) 200 MG tablet Take 200 mg by mouth daily.    [provider]  aspirin EC 81 MG tablet Take 81 mg by mouth daily.    [provider]  budesonide-formoterol (SYMBICORT) 160-4.5 MCG/ACT inhaler Inhale 2 puffs into the lungs 2 (two) times daily as needed (shortness of breath).    [provider]  buPROPion (WELLBUTRIN XL) 150 MG 24 hr tablet Take 150 mg by mouth at bedtime. 05/08/20   [provider]  cholecalciferol (VITAMIN D3) 25 MCG (1000 UT) tablet Take 1,000 Units by mouth daily.    [provider]  clopidogrel (PLAVIX) 75 MG tablet Take 75 mg by mouth daily.     [provider]  conjugated estrogens (PREMARIN) vaginal cream 1 gram intravaginal BIW 10/21/19   Harlin Heys, MD  dexlansoprazole (DEXILANT) 60 MG capsule Take 60 mg by mouth daily.     [provider]  DULoxetine (CYMBALTA) 60 MG capsule Take 60 mg by mouth daily.    [provider]  fluticasone (FLONASE) 50 MCG/ACT nasal spray Place 1 spray into both nostrils daily as needed for allergies or rhinitis.    [provider]  furosemide (LASIX)  20 MG tablet Take 20 mg by mouth daily. Patient not taking: Reported on 06/23/2020 12/20/17   [provider]  gabapentin (NEURONTIN) 400 MG capsule Take 400 mg by mouth 2 (two) times daily.     [provider]  hydrALAZINE (APRESOLINE) 25 MG tablet Take 25 mg by mouth daily.    [provider]  HYDROcodone-acetaminophen (NORCO) 7.5-325 MG tablet Take 1-2 tablets by mouth every 6 (six) hours as needed for moderate pain. 08/12/18   [name], MD  isosorbide mononitrate (IMDUR) 60 MG 24 hr tablet Take 60 mg by mouth daily.    [provider]  lisinopril (PRINIVIL,ZESTRIL) 40 MG tablet Take 40 mg by mouth daily.    [provider]  loratadine (CLARITIN) 10 MG  tablet Take 10 mg by mouth daily. 03/15/20   [provider]  meclizine (ANTIVERT) 25 MG tablet Take by mouth. 01/27/20   [provider]  metoprolol succinate (TOPROL-XL) 50 MG 24 hr tablet Take 25 mg by mouth daily.     [provider]  Nebulizers (COMPRESSOR/NEBULIZER) MISC 1 Units by Does not apply route every 4 (four) hours as needed. 05/09/18   Paulette Blanch, MD  nitroGLYCERIN (NITROSTAT) 0.4 MG SL tablet Place 0.4 mg under the tongue every 5 (five) minutes as needed for chest pain.  Patient not taking: Reported on 03/24/2020    [provider]  potassium chloride (K-DUR,KLOR-CON) 10 MEQ tablet Take 10 mEq by mouth daily.    [provider]  PREMARIN vaginal cream PLACE 8.65 (7/8IO) APPLICATORFUL VAGINALLY 2 (TWO) TIMES A WEEK. Patient not taking: Reported on 03/24/2020 04/17/18   Defrancesco, Alanda Slim, MD  REPATHA 140 MG/ML SOSY USE AS DIRECTED, TWICE A MONTH 09/08/19   [provider]  simvastatin (ZOCOR) 80 MG tablet Take 80 mg by mouth daily. Patient not taking: Reported on 06/23/2020    [provider]  Tetrahydrozoline HCl (VISINE OP) Place 1 drop into both eyes daily as needed (dry eyes).    [provider]  VIIBRYD 20 MG  TABS 10 mg for 7 days then increase to 20mg . Patient not taking: Reported on 03/24/2020 02/07/19   [provider]    Physical Exam: Vitals:   08/07/20 1536 08/07/20 1540 08/07/20 1836  BP:  106/64 130/64  Pulse:  (!) 55   Resp:  16 16  Temp:  98.2 F (36.8 C)   TempSrc:  Oral   SpO2:  97%   Weight: 68.9 kg    Height: 5\' 2"  (1.575 m)       Vitals:   08/07/20 1536 08/07/20 1540 08/07/20 1836  BP:  106/64 130/64  Pulse:  (!) 55   Resp:  16 16  Temp:  98.2 F (36.8 C)   TempSrc:  Oral   SpO2:  97%   Weight: 68.9 kg    Height: 5\' 2"  (1.575 m)     General: overweight older woman in no distress Eyes: PERRL, lids and conjunctivae normal ENMT: Mucous membranes are moist. Posterior pharynx clear of any exudate or lesions. Edentulous  Neck: normal, supple, no masses, no thyromegaly Respiratory: clear to auscultation bilaterally, no wheezing, no crackles. Normal respiratory effort. No accessory muscle use.  Cardiovascular: Regular rate and rhythm, no murmurs / rubs / gallops. No extremity edema. 2+ pedal pulses. No carotid bruits.  Abdomen: obese, no tenderness, no masses palpated. No hepatosplenomegaly. Bowel sounds positive.  Musculoskeletal: no clubbing / cyanosis. No joint deformity upper and lower extremities. Good ROM, no contractures. Normal muscle tone.  Skin: no rashes, lesions, ulcers. No induration Neurologic: CN 2-12 grossly intact. Sensation intact. Strength 5/5 in all 4.  Psychiatric: Normal judgment and insight. Alert and oriented x 3. Normal mood.     Labs on Admission: I have personally reviewed following labs and imaging studies  CBC: Recent Labs  Lab 08/07/20 1547  WBC 39.0*  39.2*  NEUTROABS 3.0  HGB 12.3  12.3  HCT 35.4*  35.7*  MCV 88.7  89.0  PLT 215  962   Basic Metabolic Panel: Recent Labs  Lab 08/07/20 1547  NA 121*  K 4.3  CL 86*  CO2 25  GLUCOSE 95  BUN 28*  CREATININE 1.33*  CALCIUM 10.0   GFR: Estimated Creatinine  Clearance: 33.2 mL/min (A) (by C-G formula based on SCr of 1.33 mg/dL (H)). Liver Function Tests: No results for input(s): AST, ALT, ALKPHOS, BILITOT, PROT, ALBUMIN in the last 168 hours. No results for input(s): LIPASE, AMYLASE in the last 168 hours. No results for input(s): AMMONIA in the last 168 hours. Coagulation Profile: No results for input(s): INR, PROTIME in the last 168 hours. Cardiac Enzymes: No results for input(s): CKTOTAL, CKMB, CKMBINDEX, TROPONINI in the last 168 hours. BNP (last 3 results) No results for input(s): PROBNP in the last 8760 hours. HbA1C: No results for input(s): HGBA1C in the last 72 hours. CBG: No results for input(s): GLUCAP in the last 168 hours. Lipid Profile: No results for input(s): CHOL, HDL, LDLCALC, TRIG, CHOLHDL, LDLDIRECT in the last 72 hours. Thyroid Function Tests: No results for input(s): TSH, T4TOTAL, FREET4, T3FREE, THYROIDAB in the last 72 hours. Anemia Panel: No results for input(s): VITAMINB12, FOLATE, FERRITIN, TIBC, IRON, RETICCTPCT in the last 72 hours. Urine analysis:    Component Value Date/Time   COLORURINE STRAW (A) 11/23/2018 0358   APPEARANCEUR CLEAR (A) 11/23/2018 0358   LABSPEC 1.003 (L) 11/23/2018 0358   PHURINE 7.0 11/23/2018 0358   GLUCOSEU NEGATIVE 11/23/2018 0358   HGBUR NEGATIVE 11/23/2018 0358   BILIRUBINUR NEGATIVE 11/23/2018 0358   BILIRUBINUR neg 12/09/2015 1559   KETONESUR NEGATIVE 11/23/2018 0358   PROTEINUR NEGATIVE 11/23/2018 0358   UROBILINOGEN 0.2 12/09/2015 1559   NITRITE NEGATIVE 11/23/2018 0358   LEUKOCYTESUR NEGATIVE 11/23/2018 0358    Radiological Exams on Admission: DG Chest 2 View  Result Date: 08/07/2020 CLINICAL DATA:  Dyspnea. EXAM: CHEST - 2 VIEW COMPARISON:  Nov 23, 2018. FINDINGS: The heart size and mediastinal contours are within normal limits. Both lungs are clear. No pneumothorax or pleural effusion is noted. The visualized skeletal structures are unremarkable. IMPRESSION: No active  cardiopulmonary disease. Electronically Signed   By: Marijo Conception M.D.   On: 08/07/2020 18:26    EKG: Independently reviewed. Sinus bradycardia, old anteroseptal injury  Assessment/Plan Active Problems:   Hyponatremia   Dysgeusia   CLL (chronic lymphocytic leukemia) ([place])    1. Hyponatremia - most likely due to excessive free water intake. She is asymptomatic except for fatigue. Sweet taste doesn't seem to be associated. Plan Sodium replacement with NS at 150 cc/hr  F/u Bmet  2. Dysgeusia - patient with altered taste - increased sense of sweet taste. Possible sinus infection vs other change in olefactory or taste function. Principle cause of #1 Plan CT head  3. CLL - stable problem with rising WBC. Differential does not suggest acute bacterial infection.    DVT prophylaxis: lovenox  Code Status: full code  Family Communication: spoke with Ferman Hamming, daughter,explainedDx and Tx plan. Disposition Plan: home when stable  Consults called: none (with names) Admission status: Obs    Adella Hare MD [facility] Pager [phone]  If 7PM-7AM, please contact night-coverage [email or web address] Password [email or web address]  08/07/2020, 9:20 PM

## 2020-08-07 NOTE — ED Notes (Signed)
Pt taken to CT at this time.

## 2020-08-07 NOTE — ED Triage Notes (Signed)
Pt c/o weakness, fatigue and loss of taste that started 08/04/20

## 2020-08-07 NOTE — ED Notes (Signed)
Pt taken to xray 

## 2020-08-08 ENCOUNTER — Inpatient Hospital Stay: Payer: Medicare Other

## 2020-08-08 DIAGNOSIS — I6389 Other cerebral infarction: Secondary | ICD-10-CM | POA: Diagnosis not present

## 2020-08-08 DIAGNOSIS — N179 Acute kidney failure, unspecified: Secondary | ICD-10-CM

## 2020-08-08 DIAGNOSIS — Z9841 Cataract extraction status, right eye: Secondary | ICD-10-CM | POA: Diagnosis not present

## 2020-08-08 DIAGNOSIS — Z888 Allergy status to other drugs, medicaments and biological substances status: Secondary | ICD-10-CM | POA: Diagnosis not present

## 2020-08-08 DIAGNOSIS — I2510 Atherosclerotic heart disease of native coronary artery without angina pectoris: Secondary | ICD-10-CM | POA: Diagnosis present

## 2020-08-08 DIAGNOSIS — Z8249 Family history of ischemic heart disease and other diseases of the circulatory system: Secondary | ICD-10-CM | POA: Diagnosis not present

## 2020-08-08 DIAGNOSIS — Z823 Family history of stroke: Secondary | ICD-10-CM | POA: Diagnosis not present

## 2020-08-08 DIAGNOSIS — R531 Weakness: Secondary | ICD-10-CM

## 2020-08-08 DIAGNOSIS — I10 Essential (primary) hypertension: Secondary | ICD-10-CM | POA: Diagnosis present

## 2020-08-08 DIAGNOSIS — U071 COVID-19: Secondary | ICD-10-CM | POA: Diagnosis present

## 2020-08-08 DIAGNOSIS — G2581 Restless legs syndrome: Secondary | ICD-10-CM | POA: Diagnosis present

## 2020-08-08 DIAGNOSIS — E871 Hypo-osmolality and hyponatremia: Secondary | ICD-10-CM | POA: Diagnosis present

## 2020-08-08 DIAGNOSIS — E7800 Pure hypercholesterolemia, unspecified: Secondary | ICD-10-CM | POA: Diagnosis present

## 2020-08-08 DIAGNOSIS — G4733 Obstructive sleep apnea (adult) (pediatric): Secondary | ICD-10-CM | POA: Diagnosis present

## 2020-08-08 DIAGNOSIS — Z85828 Personal history of other malignant neoplasm of skin: Secondary | ICD-10-CM | POA: Diagnosis not present

## 2020-08-08 DIAGNOSIS — F17210 Nicotine dependence, cigarettes, uncomplicated: Secondary | ICD-10-CM | POA: Diagnosis present

## 2020-08-08 DIAGNOSIS — I252 Old myocardial infarction: Secondary | ICD-10-CM | POA: Diagnosis not present

## 2020-08-08 DIAGNOSIS — Z801 Family history of malignant neoplasm of trachea, bronchus and lung: Secondary | ICD-10-CM | POA: Diagnosis not present

## 2020-08-08 DIAGNOSIS — Z7982 Long term (current) use of aspirin: Secondary | ICD-10-CM | POA: Diagnosis not present

## 2020-08-08 DIAGNOSIS — K219 Gastro-esophageal reflux disease without esophagitis: Secondary | ICD-10-CM | POA: Diagnosis present

## 2020-08-08 DIAGNOSIS — E039 Hypothyroidism, unspecified: Secondary | ICD-10-CM | POA: Diagnosis present

## 2020-08-08 DIAGNOSIS — Z961 Presence of intraocular lens: Secondary | ICD-10-CM | POA: Diagnosis present

## 2020-08-08 DIAGNOSIS — I6381 Other cerebral infarction due to occlusion or stenosis of small artery: Secondary | ICD-10-CM | POA: Diagnosis present

## 2020-08-08 DIAGNOSIS — C9110 Chronic lymphocytic leukemia of B-cell type not having achieved remission: Secondary | ICD-10-CM | POA: Diagnosis present

## 2020-08-08 DIAGNOSIS — R432 Parageusia: Secondary | ICD-10-CM | POA: Diagnosis not present

## 2020-08-08 DIAGNOSIS — E785 Hyperlipidemia, unspecified: Secondary | ICD-10-CM | POA: Diagnosis present

## 2020-08-08 DIAGNOSIS — Z9842 Cataract extraction status, left eye: Secondary | ICD-10-CM | POA: Diagnosis not present

## 2020-08-08 LAB — BASIC METABOLIC PANEL
Anion gap: 9 (ref 5–15)
Anion gap: 10 (ref 5–15)
Anion gap: 9 (ref 5–15)
Anion gap: 9 (ref 5–15)
Anion gap: 11 (ref 5–15)
BUN: 26 mg/dL — ABNORMAL HIGH (ref 8–23)
BUN: 26 mg/dL — ABNORMAL HIGH (ref 8–23)
BUN: 21 mg/dL (ref 8–23)
BUN: 23 mg/dL (ref 8–23)
BUN: 22 mg/dL (ref 8–23)
CO2: 23 mmol/L (ref 22–32)
CO2: 24 mmol/L (ref 22–32)
CO2: 22 mmol/L (ref 22–32)
CO2: 23 mmol/L (ref 22–32)
CO2: 23 mmol/L (ref 22–32)
Calcium: 9.5 mg/dL (ref 8.9–10.3)
Calcium: 9.9 mg/dL (ref 8.9–10.3)
Calcium: 7.9 mg/dL — ABNORMAL LOW (ref 8.9–10.3)
Calcium: 9.4 mg/dL (ref 8.9–10.3)
Calcium: 9.5 mg/dL (ref 8.9–10.3)
Chloride: 92 mmol/L — ABNORMAL LOW (ref 98–111)
Chloride: 91 mmol/L — ABNORMAL LOW (ref 98–111)
Chloride: 99 mmol/L (ref 98–111)
Chloride: 94 mmol/L — ABNORMAL LOW (ref 98–111)
Chloride: 95 mmol/L — ABNORMAL LOW (ref 98–111)
Creatinine, Ser: 1.31 mg/dL — ABNORMAL HIGH (ref 0.44–1.00)
Creatinine, Ser: 1.26 mg/dL — ABNORMAL HIGH (ref 0.44–1.00)
Creatinine, Ser: 0.97 mg/dL (ref 0.44–1.00)
Creatinine, Ser: 1.07 mg/dL — ABNORMAL HIGH (ref 0.44–1.00)
Creatinine, Ser: 1.15 mg/dL — ABNORMAL HIGH (ref 0.44–1.00)
GFR, Estimated: 42 mL/min — ABNORMAL LOW (ref >60)
GFR, Estimated: 45 mL/min — ABNORMAL LOW (ref >60)
GFR, Estimated: >60 mL/min (ref >60)
GFR, Estimated: 54 mL/min — ABNORMAL LOW (ref >60)
GFR, Estimated: 50 mL/min — ABNORMAL LOW (ref >60)
Glucose, Bld: 92 mg/dL (ref 70–99)
Glucose, Bld: 95 mg/dL (ref 70–99)
Glucose, Bld: 85 mg/dL (ref 70–99)
Glucose, Bld: 87 mg/dL (ref 70–99)
Glucose, Bld: 82 mg/dL (ref 70–99)
Potassium: 4.2 mmol/L (ref 3.5–5.1)
Potassium: 3.8 mmol/L (ref 3.5–5.1)
Potassium: 3.3 mmol/L — ABNORMAL LOW (ref 3.5–5.1)
Potassium: 3.8 mmol/L (ref 3.5–5.1)
Potassium: 4 mmol/L (ref 3.5–5.1)
Sodium: 124 mmol/L — ABNORMAL LOW (ref 135–145)
Sodium: 125 mmol/L — ABNORMAL LOW (ref 135–145)
Sodium: 130 mmol/L — ABNORMAL LOW (ref 135–145)
Sodium: 126 mmol/L — ABNORMAL LOW (ref 135–145)
Sodium: 129 mmol/L — ABNORMAL LOW (ref 135–145)

## 2020-08-08 LAB — FIBRINOGEN: Fibrinogen: 258 mg/dL (ref 210–475)

## 2020-08-08 LAB — C-REACTIVE PROTEIN: CRP: 0.5 mg/dL (ref <1.0)

## 2020-08-08 LAB — FIBRIN DERIVATIVES D-DIMER (ARMC ONLY): Fibrin derivatives D-dimer (ARMC): 368.52 ng/mL (FEU) (ref 0.00–499.00)

## 2020-08-08 LAB — PROCALCITONIN: Procalcitonin: <0.1 ng/mL

## 2020-08-08 LAB — FERRITIN: Ferritin: 69 ng/mL (ref 11–307)

## 2020-08-08 LAB — OSMOLALITY: Osmolality: 265 mOsm/kg — ABNORMAL LOW (ref 275–295)

## 2020-08-08 LAB — SARS CORONAVIRUS 2 (TAT 6-24 HRS): SARS Coronavirus 2: POSITIVE — AB

## 2020-08-08 LAB — SODIUM, URINE, RANDOM: Sodium, Ur: 59 mmol/L

## 2020-08-08 LAB — OSMOLALITY, URINE: Osmolality, Ur: 478 mOsm/kg (ref 300–900)

## 2020-08-08 MED ORDER — SODIUM CHLORIDE 0.9 % IV SOLN
100.0000 mg | Freq: Every day | INTRAVENOUS | Status: DC
  Administered 2020-08-09 – 2020-08-10 (×2): 100 mg via INTRAVENOUS
  Filled 2020-08-08 (×2): qty 20

## 2020-08-08 MED ORDER — ASCORBIC ACID 500 MG PO TABS
500.0000 mg | ORAL_TABLET | Freq: Every day | ORAL | Status: DC
  Administered 2020-08-08 – 2020-08-12 (×5): 500 mg via ORAL
  Filled 2020-08-08 (×5): qty 1

## 2020-08-08 MED ORDER — SODIUM CHLORIDE 0.9 % IV SOLN: INTRAVENOUS | Status: DC

## 2020-08-08 MED ORDER — ONDANSETRON HCL 4 MG/2ML IJ SOLN
4.0000 mg | Freq: Four times a day (QID) | INTRAMUSCULAR | Status: DC | PRN
  Administered 2020-08-08: 4 mg via INTRAVENOUS
  Filled 2020-08-08: qty 2

## 2020-08-08 MED ORDER — ZINC SULFATE 220 (50 ZN) MG PO CAPS
220.0000 mg | ORAL_CAPSULE | Freq: Every day | ORAL | Status: DC
  Administered 2020-08-08 – 2020-08-12 (×5): 220 mg via ORAL
  Filled 2020-08-08 (×5): qty 1

## 2020-08-08 MED ORDER — SODIUM CHLORIDE 0.9 % IV SOLN
200.0000 mg | Freq: Once | INTRAVENOUS | Status: AC
  Administered 2020-08-08: 200 mg via INTRAVENOUS
  Filled 2020-08-08: qty 200

## 2020-08-08 NOTE — ED Notes (Signed)
Pt ambulated to restroom with 1+ assist.

## 2020-08-08 NOTE — Progress Notes (Signed)
Remdesivir - Pharmacy Brief Note   O:  CXR:No active cardiopulmonary disease.  SpO2:94% on RA A/P:  Remdesivir 200 mg IVPB once followed by 100 mg IVPB daily x 4 days.   .me 08/08/2020 8:42 AM

## 2020-08-08 NOTE — ED Notes (Signed)
Pt ambulated without assistance to bathroom

## 2020-08-08 NOTE — ED Notes (Signed)
Loose BM in a bedpan

## 2020-08-08 NOTE — Progress Notes (Addendum)
PROGRESS NOTE    Karen [name]  QVZ:[id number] DOB: 1945/08/06 DOA: 08/07/2020 PCP: Perrin Maltese, MD   Brief Narrative: Taken from H&P.  Karen [name] is a 75 y.o. female with medical history significant of CLL, CAD, HLD, HTN, hypothyroidism, RLS, OSA. For several days she reports that everything tastes too sweet and she has not been able to eat or drink regular fluids due to associated nausea w/o emesis. She does report that she has been drinking large amounts of water. Due to her symptoms she presents to [facility] for evaulation. Patient received 1 dose of most likely Pfizer last year, never completed the vaccination.  Labs positive for hyponatremia with sodium of 121, creatinine of 1.33, leukocytosis which is chronic at 39 with 87% lymphocytes and smudge cells. Chest x-ray without any acute abnormality. CT head was done with concern of altered taste, And it shows a small low-attenuation inferior to the head of left caudate nucleus and they are recommending follow-up with MRI. No hyponatremia  labs but she did receive normal saline with some improvement in sodium.  Subjective: Patient continued to experience some nausea, no vomiting.  Per patient everything is tasting so sweet that she cannot take anything down.  Denies any other focal deficit.  Assessment & Plan:   Active Problems:   CLL (chronic lymphocytic leukemia) ([facility])   Hyponatremia   Dysgeusia  Hyponatremia.  Sodium with some improvement to 124 this morning. She did received some normal saline.  Clinically appears little dry. -Check hyponatremia labs. -Continue with normal saline at this time. -Monitor sodium. -Goal of 8-10 in 24-hour.  Dysgeusia.  Patient with altered taste and a questionable area on CT head. Can be due to COVID. -MRI brain for further evaluation, if positive then we will do stroke work-up and neurology consult.  AKI.  Baseline around 1.  Some improvement with IV fluid. -Continue with IV  hydration. -Monitor renal function. -Avoid nephrotoxins  COVID-19 infection.  No respiratory symptoms.  Chest x-ray clear.  Inflammatory markers are within normal limits. -Give her 3 doses of remdesivir  tomorrowdue to her risk factors.  CLL.  No acute concern. -Continue outpatient follow-up with oncology.  Objective: Vitals:   08/08/20 0930 08/08/20 1000 08/08/20 1130 08/08/20 1200  BP: (!) 103/56 (!) 114/54 (!) 169/79 119/67  Pulse: (!) 59 (!) 56 62 (!) 58  Resp: 15 14 18  (!) 22  Temp:      TempSrc:      SpO2: 93% 94% 97% 97%  Weight:      Height:        Intake/Output Summary (Last 24 hours) at 08/08/2020 1226 Last data filed at 08/08/2020 1127 Gross per 24 hour  Intake 1369.61 ml  Output --  Net 1369.61 ml   Filed Weights   08/07/20 1536  Weight: 68.9 kg    Examination:  General exam: Appears calm and comfortable  Respiratory system: Clear to auscultation. Respiratory effort normal. Cardiovascular system: S1 & S2 heard, RRR. Gastrointestinal system: Soft, nontender, nondistended, bowel sounds positive. Central nervous system: Alert and oriented. No focal neurological deficits.Symmetric 5 x 5 power. Extremities: No edema, no cyanosis, pulses intact and symmetrical. Psychiatry: Judgement and insight appear normal.     DVT prophylaxis: Lovenox Code Status: Full Family Communication: Talked with daughter on phone Disposition Plan:  Status is: Inpatient  Remains inpatient appropriate because:Inpatient level of care appropriate due to severity of illness   Dispo: The patient is from: Home  Anticipated d/c is to: Home              Anticipated d/c date is: 1 day              Patient currently is not medically stable to d/c.   Consultants:   None  Procedures:  Antimicrobials:   Data Reviewed: I have personally reviewed following labs and imaging studies  CBC: Recent Labs  Lab 08/07/20 1547  WBC 39.0*  39.2*  NEUTROABS 3.0  HGB 12.3   12.3  HCT 35.4*  35.7*  MCV 88.7  89.0  PLT 215  604   Basic Metabolic Panel: Recent Labs  Lab 08/07/20 1547 08/08/20 0520 08/08/20 1026  NA 121* 124* 125*  K 4.3 4.2 3.8  CL 86* 92* 91*  CO2 25 23 24   GLUCOSE 95 92 95  BUN 28* 26* 26*  CREATININE 1.33* 1.31* 1.26*  CALCIUM 10.0 9.5 9.9   GFR: Estimated Creatinine Clearance: 35.1 mL/min (A) (by C-G formula based on SCr of 1.26 mg/dL (H)). Liver Function Tests: No results for input(s): AST, ALT, ALKPHOS, BILITOT, PROT, ALBUMIN in the last 168 hours. No results for input(s): LIPASE, AMYLASE in the last 168 hours. No results for input(s): AMMONIA in the last 168 hours. Coagulation Profile: No results for input(s): INR, PROTIME in the last 168 hours. Cardiac Enzymes: No results for input(s): CKTOTAL, CKMB, CKMBINDEX, TROPONINI in the last 168 hours. BNP (last 3 results) No results for input(s): PROBNP in the last 8760 hours. HbA1C: Recent Labs    08/07/20 1547  HGBA1C 6.2*   CBG: No results for input(s): GLUCAP in the last 168 hours. Lipid Profile: No results for input(s): CHOL, HDL, LDLCALC, TRIG, CHOLHDL, LDLDIRECT in the last 72 hours. Thyroid Function Tests: No results for input(s): TSH, T4TOTAL, FREET4, T3FREE, THYROIDAB in the last 72 hours. Anemia Panel: Recent Labs    08/08/20 1026  FERRITIN 69   Sepsis Labs: Recent Labs  Lab 08/08/20 1026  PROCALCITON <0.10    Recent Results (from the past 240 hour(s))  SARS CORONAVIRUS 2 (TAT 6-24 HRS) Nasopharyngeal Nasopharyngeal Swab     Status: Abnormal   Collection Time: 08/07/20  6:00 PM   Specimen: Nasopharyngeal Swab  Result Value Ref Range Status   SARS Coronavirus 2 POSITIVE (A) NEGATIVE Final    Comment: (NOTE) SARS-CoV-2 target nucleic acids are DETECTED.  The SARS-CoV-2 RNA is generally detectable in upper and lower respiratory specimens during the acute phase of infection. Positive results are indicative of the presence of SARS-CoV-2 RNA.  Clinical correlation with patient history and other diagnostic information is  necessary to determine patient infection status. Positive results do not rule out bacterial infection or co-infection with other viruses.  The expected result is Negative.  Fact Sheet for Patients: SugarRoll.be  Fact Sheet for Healthcare Providers: https://www.woods-mathews.com/  This test is not yet approved or cleared by the [place] FDA and  has been authorized for detection and/or diagnosis of SARS-CoV-2 by FDA under an Emergency Use Authorization (EUA). This EUA will remain  in effect (meaning this test can be used) for the duration of the COVID-19 declaration under Section 564(b)(1) of the Act, 21 U. S.C. section 360bbb-3(b)(1), unless the authorization is terminated or revoked sooner.   Performed at [facility], [place] [address] [place], [place] [address]      Radiology Studies: DG Chest 2 View  Result Date: 08/07/2020 CLINICAL DATA:  Dyspnea. EXAM: CHEST - 2 VIEW COMPARISON:  Nov 23, 2018. FINDINGS: The heart size and mediastinal contours are within normal limits. Both lungs are clear. No pneumothorax or pleural effusion is noted. The visualized skeletal structures are unremarkable. IMPRESSION: No active cardiopulmonary disease. Electronically Signed   By: Marijo Conception M.D.   On: 08/07/2020 18:26   CT HEAD WO CONTRAST  Result Date: 08/07/2020 CLINICAL DATA:  Weakness and fatigue. EXAM: CT HEAD WITHOUT CONTRAST TECHNIQUE: Contiguous axial images were obtained from the base of the skull through the vertex without intravenous contrast. COMPARISON:  November 08, 2009 FINDINGS: Brain: There is mild cerebral atrophy with widening of the extra-axial spaces and ventricular dilatation. There are areas of decreased attenuation within the white matter tracts of the supratentorial brain, consistent with microvascular disease changes. A 1.3 cm x 0.5 cm area of  white matter low attenuation is seen inferior to the head of the left caudate nucleus. This represents a new finding when compared to the prior study. There is no evidence of associated mass effect or midline shift. No acute hemorrhage is identified. Vascular: No hyperdense vessel or unexpected calcification. Skull: Normal. Negative for fracture or focal lesion. Sinuses/Orbits: No acute finding. Other: None. IMPRESSION: 1. Small area of white matter low attenuation inferior to the head of the left caudate nucleus which may represent a small acute to subacute infarct. MRI correlation is recommended. Electronically Signed   By: Virgina Norfolk M.D.   On: 08/07/2020 21:49    Scheduled Meds: . amiodarone  200 mg Oral Daily  . vitamin C  500 mg Oral Daily  . aspirin EC  81 mg Oral Daily  . buPROPion  150 mg Oral QHS  . clopidogrel  75 mg Oral Daily  . DULoxetine  60 mg Oral Daily  . enoxaparin (LOVENOX) injection  40 mg Subcutaneous Q24H  . gabapentin  400 mg Oral BID  . hydrALAZINE  25 mg Oral Daily  . isosorbide mononitrate  60 mg Oral Daily  . lisinopril  40 mg Oral Daily  . loratadine  10 mg Oral Daily  . metoprolol succinate  25 mg Oral Daily  . mometasone-formoterol  2 puff Inhalation BID  . pantoprazole  40 mg Oral Daily  . potassium chloride  10 mEq Oral Daily  . senna  1 tablet Oral BID  . zinc sulfate  220 mg Oral Daily   Continuous Infusions: . sodium chloride    . [START ON 08/09/2020] remdesivir 100 mg in NS 100 mL       LOS: 0 days   Time spent: 35 minutes.  Lorella Nimrod, MD [facility]  If 7PM-7AM, please contact night-coverage [email or web address]  08/08/2020, 12:26 PM   This record has been created using Systems analyst. Errors have been sought and corrected,but may not always be located. Such creation errors do not reflect on the standard of care.

## 2020-08-08 NOTE — ED Notes (Signed)
Patient transported to MRI 

## 2020-08-09 DIAGNOSIS — C9110 Chronic lymphocytic leukemia of B-cell type not having achieved remission: Secondary | ICD-10-CM | POA: Diagnosis not present

## 2020-08-09 DIAGNOSIS — R531 Weakness: Secondary | ICD-10-CM | POA: Diagnosis not present

## 2020-08-09 DIAGNOSIS — E871 Hypo-osmolality and hyponatremia: Secondary | ICD-10-CM | POA: Diagnosis not present

## 2020-08-09 DIAGNOSIS — N179 Acute kidney failure, unspecified: Secondary | ICD-10-CM | POA: Diagnosis not present

## 2020-08-09 LAB — BASIC METABOLIC PANEL
Anion gap: 8 (ref 5–15)
Anion gap: 7 (ref 5–15)
BUN: 18 mg/dL (ref 8–23)
BUN: 16 mg/dL (ref 8–23)
CO2: 22 mmol/L (ref 22–32)
CO2: 25 mmol/L (ref 22–32)
Calcium: 9.2 mg/dL (ref 8.9–10.3)
Calcium: 9.4 mg/dL (ref 8.9–10.3)
Chloride: 96 mmol/L — ABNORMAL LOW (ref 98–111)
Chloride: 96 mmol/L — ABNORMAL LOW (ref 98–111)
Creatinine, Ser: 1.02 mg/dL — ABNORMAL HIGH (ref 0.44–1.00)
Creatinine, Ser: 0.98 mg/dL (ref 0.44–1.00)
GFR, Estimated: 57 mL/min — ABNORMAL LOW (ref >60)
GFR, Estimated: >60 mL/min (ref >60)
Glucose, Bld: 82 mg/dL (ref 70–99)
Glucose, Bld: 85 mg/dL (ref 70–99)
Potassium: 3.4 mmol/L — ABNORMAL LOW (ref 3.5–5.1)
Potassium: 3.6 mmol/L (ref 3.5–5.1)
Sodium: 126 mmol/L — ABNORMAL LOW (ref 135–145)
Sodium: 128 mmol/L — ABNORMAL LOW (ref 135–145)

## 2020-08-09 MED ORDER — ATORVASTATIN CALCIUM 20 MG PO TABS
40.0000 mg | ORAL_TABLET | Freq: Every day | ORAL | Status: DC
  Administered 2020-08-09 – 2020-08-10 (×2): 40 mg via ORAL
  Filled 2020-08-09 (×2): qty 2

## 2020-08-09 NOTE — Progress Notes (Signed)
PROGRESS NOTE    Karen [name]  GLO:[id number] DOB: 1946/01/03 DOA: 08/07/2020 PCP: Perrin Maltese, MD   Brief Narrative: Taken from H&P.  Karen [name] is a 75 y.o. female with medical history significant of CLL, CAD, HLD, HTN, hypothyroidism, RLS, OSA. For several days she reports that everything tastes too sweet and she has not been able to eat or drink regular fluids due to associated nausea w/o emesis. She does report that she has been drinking large amounts of water. Due to her symptoms she presents to [facility]ED for evaulation. Patient received 1 dose of most likely Pfizer last year, never completed the vaccination.  Labs positive for hyponatremia with sodium of 121, creatinine of 1.33, leukocytosis which is chronic at 39 with 87% lymphocytes and smudge cells. Chest x-ray without any acute abnormality. CT head was done with concern of altered taste, And it shows a small low-attenuation inferior to the head of left caudate nucleus and they are recommending follow-up with MRI. No hyponatremia  labs but she did receive normal saline with some improvement in sodium.  Subjective: Patient seems improving.  No new complaint.  She was asking about her boyfriend, apparently came to ED with COVID last night.  Assessment & Plan:   Active Problems:   CLL (chronic lymphocytic leukemia) (HCC)   Hyponatremia   Dysgeusia  Hyponatremia.  Sodium remains stable at 128 this morning, hyponatremia labs with hypoosmolar hyponatremia.  Patient admitted drinking a lot of free water. She did received some normal saline.  -Discontinue IV fluid. -Encourage addition of salt to diet and restrict free water. -Monitor sodium.  Dysgeusia.  Patient with altered taste and a questionable area on CT head. Can be due to COVID.  MRI with small punctate acute infarct in high paramidline right frontal lobe parietal cortex, and some remote lacunar infarcts involving left basal ganglia and right cerebellum. Patient is  already on aspirin and Plavix.  No new deficit. -Neurology consult. -Check lipid panel -Echocardiogram -PT/OT evaluation to complete stroke work-up  AKI.  Resolved.  Baseline around 1.   -Monitor renal function. -Avoid nephrotoxins  COVID-19 infection.  No respiratory symptoms.  Chest x-ray clear.  Inflammatory markers are within normal limits. -Give her 3 doses of remdesivir due to her risk factors.  CLL.  No acute concern. -Continue outpatient follow-up with oncology.  Objective: Vitals:   08/09/20 0600 08/09/20 0700 08/09/20 0730 08/09/20 0745  BP: (!) 146/68 (!) 164/83    Pulse: (!) 58 (!) 59  61  Resp: 14 14  17   Temp:   97.7 F (36.5 C)   TempSrc:   Oral   SpO2: 95% 95% 95% 95%  Weight:      Height:        Intake/Output Summary (Last 24 hours) at 08/09/2020 1236 Last data filed at 08/08/2020 2216 Gross per 24 hour  Intake 393.57 ml  Output --  Net 393.57 ml   Filed Weights   08/07/20 1536  Weight: 68.9 kg    Examination:  General.  Chronically ill-appearing elderly lady, in no acute distress. Pulmonary.  Lungs clear bilaterally, normal respiratory effort. CV.  Regular rate and rhythm, no JVD, rub or murmur. Abdomen.  Soft, nontender, nondistended, BS positive. CNS.  Alert and oriented x3.  No focal neurologic deficit. Extremities.  No edema, no cyanosis, pulses intact and symmetrical. Psychiatry.  Judgment and insight appears normal.   DVT prophylaxis: Lovenox Code Status: Full Family Communication: Talked with daughter on phone Disposition Plan:  Status  is: Inpatient  Remains inpatient appropriate because:Inpatient level of care appropriate due to severity of illness   Dispo: The patient is from: Home              Anticipated d/c is to: Home              Anticipated d/c date is: 1 day              Patient currently is not medically stable to d/c.   Consultants:   Neurology  Procedures:  Antimicrobials:   Data Reviewed: I have personally  reviewed following labs and imaging studies  CBC: Recent Labs  Lab 08/07/20 1547  WBC 39.0*  39.2*  NEUTROABS 3.0  HGB 12.3  12.3  HCT 35.4*  35.7*  MCV 88.7  89.0  PLT 215  154   Basic Metabolic Panel: Recent Labs  Lab 08/08/20 1316 08/08/20 1654 08/08/20 2230 08/09/20 0336 08/09/20 0800  NA 130* 126* 129* 126* 128*  K 3.3* 3.8 4.0 3.4* 3.6  CL 99 94* 95* 96* 96*  CO2 22 23 23 22 25   GLUCOSE 85 87 82 82 85  BUN 21 23 22 18 16   CREATININE 0.97 1.07* 1.15* 1.02* 0.98  CALCIUM 7.9* 9.4 9.5 9.2 9.4   GFR: Estimated Creatinine Clearance: 45.1 mL/min (by C-G formula based on SCr of 0.98 mg/dL). Liver Function Tests: No results for input(s): AST, ALT, ALKPHOS, BILITOT, PROT, ALBUMIN in the last 168 hours. No results for input(s): LIPASE, AMYLASE in the last 168 hours. No results for input(s): AMMONIA in the last 168 hours. Coagulation Profile: No results for input(s): INR, PROTIME in the last 168 hours. Cardiac Enzymes: No results for input(s): CKTOTAL, CKMB, CKMBINDEX, TROPONINI in the last 168 hours. BNP (last 3 results) No results for input(s): PROBNP in the last 8760 hours. HbA1C: Recent Labs    08/07/20 1547  HGBA1C 6.2*   CBG: No results for input(s): GLUCAP in the last 168 hours. Lipid Profile: No results for input(s): CHOL, HDL, LDLCALC, TRIG, CHOLHDL, LDLDIRECT in the last 72 hours. Thyroid Function Tests: No results for input(s): TSH, T4TOTAL, FREET4, T3FREE, THYROIDAB in the last 72 hours. Anemia Panel: Recent Labs    08/08/20 1026  FERRITIN 69   Sepsis Labs: Recent Labs  Lab 08/08/20 1026  PROCALCITON <0.10    Recent Results (from the past 240 hour(s))  SARS CORONAVIRUS 2 (TAT 6-24 HRS) Nasopharyngeal Nasopharyngeal Swab     Status: Abnormal   Collection Time: 08/07/20  6:00 PM   Specimen: Nasopharyngeal Swab  Result Value Ref Range Status   SARS Coronavirus 2 POSITIVE (A) NEGATIVE Final    Comment: (NOTE) SARS-CoV-2 target nucleic  acids are DETECTED.  The SARS-CoV-2 RNA is generally detectable in upper and lower respiratory specimens during the acute phase of infection. Positive results are indicative of the presence of SARS-CoV-2 RNA. Clinical correlation with patient history and other diagnostic information is  necessary to determine patient infection status. Positive results do not rule out bacterial infection or co-infection with other viruses.  The expected result is Negative.  Fact Sheet for Patients: SugarRoll.be  Fact Sheet for Healthcare Providers: https://www.woods-mathews.com/  This test is not yet approved or cleared by the [place] FDA and  has been authorized for detection and/or diagnosis of SARS-CoV-2 by FDA under an Emergency Use Authorization (EUA). This EUA will remain  in effect (meaning this test can be used) for the duration of the COVID-19 declaration under Section 564(b)(1) of the  Act, 21 U. S.C. section 360bbb-3(b)(1), unless the authorization is terminated or revoked sooner.   Performed at [facility], [place] [address] [place], [place] [address]      Radiology Studies: DG Chest 2 View  Result Date: 08/07/2020 CLINICAL DATA:  Dyspnea. EXAM: CHEST - 2 VIEW COMPARISON:  Nov 23, 2018. FINDINGS: The heart size and mediastinal contours are within normal limits. Both lungs are clear. No pneumothorax or pleural effusion is noted. The visualized skeletal structures are unremarkable. IMPRESSION: No active cardiopulmonary disease. Electronically Signed   By: Marijo Conception M.D.   On: 08/07/2020 18:26   CT HEAD WO CONTRAST  Result Date: 08/07/2020 CLINICAL DATA:  Weakness and fatigue. EXAM: CT HEAD WITHOUT CONTRAST TECHNIQUE: Contiguous axial images were obtained from the base of the skull through the vertex without intravenous contrast. COMPARISON:  November 08, 2009 FINDINGS: Brain: There is mild cerebral atrophy with widening of the  extra-axial spaces and ventricular dilatation. There are areas of decreased attenuation within the white matter tracts of the supratentorial brain, consistent with microvascular disease changes. A 1.3 cm x 0.5 cm area of white matter low attenuation is seen inferior to the head of the left caudate nucleus. This represents a new finding when compared to the prior study. There is no evidence of associated mass effect or midline shift. No acute hemorrhage is identified. Vascular: No hyperdense vessel or unexpected calcification. Skull: Normal. Negative for fracture or focal lesion. Sinuses/Orbits: No acute finding. Other: None. IMPRESSION: 1. Small area of white matter low attenuation inferior to the head of the left caudate nucleus which may represent a small acute to subacute infarct. MRI correlation is recommended. Electronically Signed   By: Virgina Norfolk M.D.   On: 08/07/2020 21:49   MR BRAIN WO CONTRAST  Result Date: 08/08/2020 CLINICAL DATA:  Neuro deficit, acute stroke suspected. EXAM: MRI HEAD WITHOUT CONTRAST TECHNIQUE: Multiplanar, multiecho pulse sequences of the brain and surrounding structures were obtained without intravenous contrast. COMPARISON:  Same day head CT. FINDINGS: Brain: Motion limited study. Punctate acute infarct in the high paramidline right frontoparietal cortex (see series 9, images 33 and 34). Minimal associated edema without mass effect. Additional confluent T2/FLAIR hyperintensities within the white matter, compatible with advanced chronic microvascular ischemic disease. Remote lacunar infarcts in the left basal ganglia and inferior right cerebellum. No acute hemorrhage. Punctate areas of susceptibility artifact within bilateral thalami, right basal ganglia and left frontal white matter, compatible with prior microhemorrhages. No hydrocephalus. No midline shift. No extra-axial fluid collection. No mass lesion. Mild to moderate generalized cerebral atrophy with ex vacuo  ventricular dilation. Vascular: Major arterial flow voids are maintained at the skull base. Skull and upper cervical spine: Normal marrow signal. Sinuses/Orbits: Mild ethmoid air cell mucosal thickening without air-fluid levels. Unremarkable orbits. Other: Small right mastoid effusion. IMPRESSION: 1. Punctate acute infarct in the high paramidline right frontoparietal cortex. Minimal associated edema without mass effect. 2. Remote lacunar infarcts in the left basal ganglia and right cerebellum. 3. Advanced chronic microvascular ischemic disease. Electronically Signed   By: Margaretha Sheffield MD   On: 08/08/2020 16:59    Scheduled Meds: . amiodarone  200 mg Oral Daily  . vitamin C  500 mg Oral Daily  . aspirin EC  81 mg Oral Daily  . buPROPion  150 mg Oral QHS  . clopidogrel  75 mg Oral Daily  . DULoxetine  60 mg Oral Daily  . enoxaparin (LOVENOX) injection  40 mg Subcutaneous Q24H  .  gabapentin  400 mg Oral BID  . hydrALAZINE  25 mg Oral Daily  . isosorbide mononitrate  60 mg Oral Daily  . lisinopril  40 mg Oral Daily  . loratadine  10 mg Oral Daily  . metoprolol succinate  25 mg Oral Daily  . mometasone-formoterol  2 puff Inhalation BID  . pantoprazole  40 mg Oral Daily  . potassium chloride  10 mEq Oral Daily  . senna  1 tablet Oral BID  . zinc sulfate  220 mg Oral Daily   Continuous Infusions: . remdesivir 100 mg in NS 100 mL       LOS: 1 day   Time spent: 30 minutes.  Lorella Nimrod, MD [facility]  If 7PM-7AM, please contact night-coverage [email or web address]  08/09/2020, 12:36 PM   This record has been created using Systems analyst. Errors have been sought and corrected,but may not always be located. Such creation errors do not reflect on the standard of care.

## 2020-08-09 NOTE — Evaluation (Signed)
Physical Therapy Evaluation Patient Details Name: Karen [name] MRN: [id number] DOB: [birth year] Today's Date: 08/09/2020   History of Present Illness  Pt admitted for weakness/fatigue and loss of taste and smell. Pt now positive with Covid. History includes CLL, CAD, HLD, and HTN. Of note, now diagnosed with acute CVA.  Clinical Impression  Pt is a pleasant 75 year old female who was admitted for Covid. Of note, also has tested positive for CVA. Pt performs bed mobility with supervision and transfers/ambulation with min assist. Pt with no sensation/coordination deficits at this time. Pt demonstrates deficits with strength/mobility/balance. Would benefit from trial with RW. Would benefit from skilled PT to address above deficits and promote optimal return to PLOF. Recommend transition to [place] upon discharge from acute hospitalization.     Follow Up Recommendations Home health PT;Supervision for mobility/OOB    Equipment Recommendations  Rolling walker with 5" wheels    Recommendations for Other Services       Precautions / Restrictions Precautions Precautions: Fall Restrictions Weight Bearing Restrictions: No      Mobility  Bed Mobility Overal bed mobility: Needs Assistance Bed Mobility: Supine to Sit     Supine to sit: Supervision     General bed mobility comments: safe technique with ease of transfer. Once seated, slight dizziness, however quickly improves    Transfers Overall transfer level: Needs assistance Equipment used: 1 person hand held assist Transfers: Sit to/from Stand Sit to Stand: Min assist         General transfer comment: Needs HHA to maintain balance  Ambulation/Gait Ambulation/Gait assistance: Min assist Gait Distance (Feet): 50 Feet Assistive device: 1 person hand held assist Gait Pattern/deviations: Step-through pattern     General Gait Details: ambulated to/from bathroom in hallway. Unsteady with HHA. Needs assist to maintain balance.  Short step length noted  Stairs            Wheelchair Mobility    Modified Rankin (Stroke Patients Only)       Balance Overall balance assessment: Needs assistance Sitting-balance support: Feet supported Sitting balance-Leahy Scale: Good     Standing balance support: Single extremity supported Standing balance-Leahy Scale: Fair Standing balance comment: wide BOS                             Pertinent Vitals/Pain Pain Assessment: No/denies pain    Home Living Family/patient expects to be discharged to:: Private residence Living Arrangements: Children (lives with 2 adult daughters) Available Help at Discharge: Family Type of Home: House Home Access: Level entry     Home Layout: One level Home Equipment: None Additional Comments: at back entrance, has 2 steps with no rails.    Prior Function Level of Independence: Independent         Comments: reports no recent falls, however she claims that she furniture ambulates mainly in household     Hand Dominance        Extremity/Trunk Assessment   Upper Extremity Assessment Upper Extremity Assessment: Overall WFL for tasks assessed    Lower Extremity Assessment Lower Extremity Assessment: Generalized weakness (B LE grossly 4/5)       Communication   Communication: No difficulties  Cognition Arousal/Alertness: Awake/alert Behavior During Therapy: WFL for tasks assessed/performed Overall Cognitive Status: Within Functional Limits for tasks assessed  General Comments      Exercises Other Exercises Other Exercises: ambulated to bathroom. Needs assist for transfers with grab bar. Once seated, able to perform self hygiene with supervision. Needs cga for balance during hand hygiene   Assessment/Plan    PT Assessment Patient needs continued PT services  PT Problem List Decreased strength;Decreased balance;Decreased mobility       PT  Treatment Interventions DME instruction;Gait training;Therapeutic exercise;Therapeutic activities;Balance training    PT Goals (Current goals can be found in the Care Plan section)  Acute Rehab PT Goals Patient Stated Goal: to go home PT Goal Formulation: With patient Time For Goal Achievement: 08/23/20 Potential to Achieve Goals: Good    Frequency 7X/week   Barriers to discharge        Co-evaluation               AM-PAC PT "6 Clicks" Mobility  Outcome Measure Help needed turning from your back to your side while in a flat bed without using bedrails?: None Help needed moving from lying on your back to sitting on the side of a flat bed without using bedrails?: None Help needed moving to and from a bed to a chair (including a wheelchair)?: A Little Help needed standing up from a chair using your arms (e.g., wheelchair or bedside chair)?: A Little Help needed to walk in hospital room?: A Little Help needed climbing 3-5 steps with a railing? : A Lot 6 Click Score: 19    End of Session Equipment Utilized During Treatment: Gait belt Activity Tolerance: Patient tolerated treatment well Patient left: in bed Nurse Communication: Mobility status PT Visit Diagnosis: Unsteadiness on feet (R26.81);Muscle weakness (generalized) (M62.81);Difficulty in walking, not elsewhere classified (R26.2)    Time: 8280-0349 PT Time Calculation (min) (ACUTE ONLY): 27 min   Charges:   PT Evaluation $PT Eval Low Complexity: 1 Low PT Treatments $Therapeutic Activity: 8-22 mins        Greggory Stallion, PT, DPT [phone]   Sharman Garrott 08/09/2020, 12:43 PM

## 2020-08-09 NOTE — Evaluation (Signed)
Occupational Therapy Evaluation Patient Details Name: Karen [name] MRN: [id number] DOB: 13-Jan-1946 Today's Date: 08/09/2020    History of Present Illness Pt admitted for weakness/fatigue and loss of taste and smell. Pt now positive with Covid. History includes CLL, CAD, HLD, and HTN. Of note, now diagnosed with acute CVA.   Clinical Impression   Patient presenting with decreased I in self care, balance, functional mobility/transfers, endurance, and safety awareness.  Patient reports being independent without use of AD PTA. Pt lives with family and reports being very active. Patient currently functioning min hand held assistance to ambulate 100' without use of AD and cuing for forward gaze. Pt needing min A for balance with clothing management and hygiene after toileting. Pt does fatigue quickly and would benefit from energy conservation education. Pt on RA throughout. Patient will benefit from acute OT to increase overall independence in the areas of ADLs, functional mobility, and safety awareness in order to safely discharge home with family.    Follow Up Recommendations  Home health OT;Supervision/Assistance - 24 hour    Equipment Recommendations  Tub/shower seat       Precautions / Restrictions Precautions Precautions: Fall Restrictions Weight Bearing Restrictions: No      Mobility Bed Mobility Overal bed mobility: Needs Assistance Bed Mobility: Supine to Sit     Supine to sit: Supervision     General bed mobility comments: safe technique with ease of transfer. Once seated, slight dizziness, however quickly improves    Transfers Overall transfer level: Needs assistance Equipment used: 1 person hand held assist Transfers: Sit to/from Stand Sit to Stand: Min assist         General transfer comment: Needs HHA to maintain balance    Balance Overall balance assessment: Needs assistance Sitting-balance support: Feet supported Sitting balance-Leahy Scale: Good      Standing balance support: Single extremity supported Standing balance-Leahy Scale: Fair Standing balance comment: wide BOS                           ADL either performed or assessed with clinical judgement   ADL Overall ADL's : Needs assistance/impaired     Grooming: Wash/dry hands;Wash/dry face;Oral care;Standing;Supervision/safety                   Toilet Transfer: Economist and Hygiene: Min guard;Sit to/from stand               Vision Baseline Vision/History: No visual deficits Patient Visual Report: No change from baseline              Pertinent Vitals/Pain Pain Assessment: No/denies pain     Hand Dominance Right   Extremity/Trunk Assessment Upper Extremity Assessment Upper Extremity Assessment: Overall WFL for tasks assessed   Lower Extremity Assessment Lower Extremity Assessment: Generalized weakness       Communication Communication Communication: No difficulties   Cognition Arousal/Alertness: Awake/alert Behavior During Therapy: WFL for tasks assessed/performed Overall Cognitive Status: Within Functional Limits for tasks assessed                                        Exercises Exercises: Other exercises Other Exercises Other Exercises: ambulated to bathroom. Needs assist for transfers with grab bar. Once seated, able to perform self hygiene with supervision. Needs cga for balance during hand hygiene  Home Living Family/patient expects to be discharged to:: Private residence Living Arrangements: Spouse/significant other;Children Available Help at Discharge: Family Type of Home: House Home Access: Level entry     Home Layout: One level     Bathroom Shower/Tub: Tub/shower unit;Curtain         [place]: None   Additional Comments: at back entrance, has 2 steps with no rails.      Prior Functioning/Environment Level of Independence:  Independent        Comments: reports no recent falls, however she claims that she furniture ambulates mainly in household        OT Problem List: Decreased strength;Decreased activity tolerance;Impaired balance (sitting and/or standing);Decreased safety awareness;Pain;Decreased knowledge of use of DME or AE      OT Treatment/Interventions: Self-care/ADL training;Therapeutic exercise;Energy conservation;DME and/or AE instruction;Therapeutic activities;Balance training;Manual therapy;Modalities;Patient/family education    OT Goals(Current goals can be found in the care plan section) Acute Rehab OT Goals Patient Stated Goal: to go home OT Goal Formulation: With patient Time For Goal Achievement: 08/23/20 Potential to Achieve Goals: Good ADL Goals Pt Will Perform Grooming: with modified independence;standing Pt Will Transfer to Toilet: with modified independence;ambulating Pt Will Perform Toileting - Clothing Manipulation and hygiene: with modified independence;sit to/from stand Pt Will Perform Tub/Shower Transfer: with modified independence;ambulating Pt/caregiver will Perform Home Exercise Program: Increased strength;Both right and left upper extremity;Independently;With theraband;With written HEP provided  OT Frequency: Min 2X/week   Barriers to D/C:    none known at this time          AM-PAC OT "6 Clicks" Daily Activity     Outcome Measure Help from another person eating meals?: None Help from another person taking care of personal grooming?: A Little Help from another person toileting, which includes using toliet, bedpan, or urinal?: A Little Help from another person bathing (including washing, rinsing, drying)?: A Little Help from another person to put on and taking off regular upper body clothing?: None Help from another person to put on and taking off regular lower body clothing?: A Little 6 Click Score: 20   End of Session Nurse Communication: Mobility  status  Activity Tolerance: Patient tolerated treatment well Patient left: in bed;with call bell/phone within reach;with bed alarm set  OT Visit Diagnosis: Unsteadiness on feet (R26.81);Repeated falls (R29.6);Muscle weakness (generalized) (M62.81)                Time: 8832-5498 OT Time Calculation (min): 21 min Charges:  OT General Charges $OT Visit: 1 Visit OT Evaluation $OT Eval Low Complexity: 1 Low OT Treatments $Self Care/Home Management : 8-22 mins  Darleen Crocker, MS, OTR/L , CBIS ascom [phone]  08/09/20, 4:05 PM

## 2020-08-09 NOTE — ED Notes (Signed)
Pt ambulated to toilet. Initially slightly off balance, gait steady after a few steps. No difficulty ambulating. Pt provided with breakfast tray

## 2020-08-10 ENCOUNTER — Inpatient Hospital Stay (HOSPITAL_COMMUNITY)
Admit: 2020-08-10 | Discharge: 2020-08-10 | Disposition: A | Discharge status: Home / Self Care | Payer: Medicare Other | Attending: Internal Medicine | Admitting: Internal Medicine

## 2020-08-10 ENCOUNTER — Inpatient Hospital Stay: Payer: Medicare Other

## 2020-08-10 DIAGNOSIS — I6389 Other cerebral infarction: Secondary | ICD-10-CM

## 2020-08-10 DIAGNOSIS — C9110 Chronic lymphocytic leukemia of B-cell type not having achieved remission: Secondary | ICD-10-CM | POA: Diagnosis not present

## 2020-08-10 DIAGNOSIS — N179 Acute kidney failure, unspecified: Secondary | ICD-10-CM | POA: Diagnosis not present

## 2020-08-10 DIAGNOSIS — E871 Hypo-osmolality and hyponatremia: Secondary | ICD-10-CM | POA: Diagnosis not present

## 2020-08-10 DIAGNOSIS — I6381 Other cerebral infarction due to occlusion or stenosis of small artery: Secondary | ICD-10-CM

## 2020-08-10 DIAGNOSIS — R531 Weakness: Secondary | ICD-10-CM | POA: Diagnosis not present

## 2020-08-10 LAB — BASIC METABOLIC PANEL
Anion gap: 10 (ref 5–15)
BUN: 18 mg/dL (ref 8–23)
CO2: 22 mmol/L (ref 22–32)
Calcium: 9.7 mg/dL (ref 8.9–10.3)
Chloride: 98 mmol/L (ref 98–111)
Creatinine, Ser: 1.41 mg/dL — ABNORMAL HIGH (ref 0.44–1.00)
GFR, Estimated: 39 mL/min — ABNORMAL LOW (ref >60)
Glucose, Bld: 81 mg/dL (ref 70–99)
Potassium: 4 mmol/L (ref 3.5–5.1)
Sodium: 130 mmol/L — ABNORMAL LOW (ref 135–145)

## 2020-08-10 LAB — ECHOCARDIOGRAM COMPLETE
AR max vel: 2.45 cm2
AV Area VTI: 2.9 cm2
AV Area mean vel: 3.01 cm2
AV Mean grad: 3 mmHg
AV Peak grad: 7.4 mmHg
Ao pk vel: 1.36 m/s
Area-P 1/2: 2.71 cm2
Height: 62 in
S' Lateral: 2.5 cm
Weight: 2432 oz

## 2020-08-10 LAB — LIPID PANEL
Cholesterol: 176 mg/dL (ref 0–200)
HDL: 21 mg/dL — ABNORMAL LOW (ref >40)
LDL Cholesterol: 138 mg/dL — ABNORMAL HIGH (ref 0–99)
Total CHOL/HDL Ratio: 8.4 RATIO
Triglycerides: 87 mg/dL (ref <150)
VLDL: 17 mg/dL (ref 0–40)

## 2020-08-10 MED ORDER — LACTATED RINGERS IV SOLN: INTRAVENOUS | Status: AC

## 2020-08-10 MED ORDER — ATORVASTATIN CALCIUM 20 MG PO TABS
80.0000 mg | ORAL_TABLET | Freq: Every day | ORAL | Status: DC
  Administered 2020-08-10 – 2020-08-12 (×3): 80 mg via ORAL
  Filled 2020-08-10 (×3): qty 4

## 2020-08-10 NOTE — Progress Notes (Signed)
PROGRESS NOTE    Karen [name]  KGU:[id number] DOB: 05-16-1946 DOA: 08/07/2020 PCP: Perrin Maltese, MD   Brief Narrative: Taken from H&P.  Karen [name] is a 75 y.o. female with medical history significant of CLL, CAD, HLD, HTN, hypothyroidism, RLS, OSA. For several days she reports that everything tastes too sweet and she has not been able to eat or drink regular fluids due to associated nausea w/o emesis. She does report that she has been drinking large amounts of water. Due to her symptoms she presents to [facility] for evaulation. Patient received 1 dose of most likely Pfizer last year, never completed the vaccination.  Labs positive for hyponatremia with sodium of 121, creatinine of 1.33, leukocytosis which is chronic at 39 with 87% lymphocytes and smudge cells. Chest x-ray without any acute abnormality. CT head was done with concern of altered taste, And it shows a small low-attenuation inferior to the head of left caudate nucleus and they are recommending follow-up with MRI, which was positive for a small punctate infarct.  Neurology was consulted and she is completing stroke work-up, no new deficit.  No hyponatremia  labs but she did receive normal saline with some improvement in sodium, hyponatremia labs obtained after getting IV fluid and it shows hypoosmolar hyponatremia.  IV fluid discontinued.  Sodium at 130 today.  Subjective: Patient was getting her carotid ultrasound when seen today.  No new complaint.  Continues to feel very weak.  Assessment & Plan:   Active Problems:   CLL (chronic lymphocytic leukemia) (HCC)   Hyponatremia   Dysgeusia  Hyponatremia.  Sodium remains stable at 130 this morning, hyponatremia labs with hypoosmolar hyponatremia.  Patient admitted drinking a lot of free water. She did received some normal saline.  -Encourage addition of salt to diet and restrict free water. -Monitor sodium.  Dysgeusia.  Patient with altered taste and a questionable area on CT  head. Can be due to COVID.  MRI with small punctate acute infarct in high paramidline right frontal lobe parietal cortex, and some remote lacunar infarcts involving left basal ganglia and right cerebellum. Patient is already on aspirin and Plavix.  No new deficit. -Neurology consult-recommending MRA-still pending. -Check lipid panel-which shows total cholesterol of 176, HDL 21 and LDL 138. -Echocardiogram-done pending results -PT/OT evaluation to complete stroke work-up-recommending home health services which were ordered. -Start her on Lipitor 80 mg daily. -Continue aspirin and Plavix.  AKI.  Resolved.  Baseline around 1.   -Monitor renal function. -Avoid nephrotoxins  COVID-19 infection.  No respiratory symptoms.  Chest x-ray clear.  Inflammatory markers are within normal limits. -Completed 3 days of remdesivir.  CLL.  No acute concern. -Continue outpatient follow-up with oncology.  Objective: Vitals:   08/09/20 2127 08/10/20 0457 08/10/20 0829 08/10/20 1153  BP: 122/69 132/77 120/67 (!) 94/49  Pulse: 60 61 (!) 52 (!) 52  Resp: 18 18 17 16   Temp: 98.1 F (36.7 C) 98.2 F (36.8 C) (!) 97.2 F (36.2 C) 97.8 F (36.6 C)  TempSrc: Oral Oral  Oral  SpO2: 94% 95% 96% 93%  Weight:      Height:       No intake or output data in the 24 hours ending 08/10/20 1507 Filed Weights   08/07/20 1536  Weight: 68.9 kg    Examination:  General.  Well-developed elderly lady, in no acute distress. Pulmonary.  Lungs clear bilaterally, normal respiratory effort. CV.  Regular rate and rhythm, no JVD, rub or murmur. Abdomen.  Soft, nontender,  nondistended, BS positive. CNS.  Alert and oriented x3.  No focal neurologic deficit. Extremities.  No edema, no cyanosis, pulses intact and symmetrical. Psychiatry.  Judgment and insight appears normal.   DVT prophylaxis: Lovenox Code Status: Full Family Communication: Talked with daughter on phone Disposition Plan:  Status is:  Inpatient  Remains inpatient appropriate because:Inpatient level of care appropriate due to severity of illness   Dispo: The patient is from: Home              Anticipated d/c is to: Home              Anticipated d/c date is: 1 day              Patient currently is not medically stable to d/c.   Consultants:   Neurology  Procedures:  Antimicrobials:   Data Reviewed: I have personally reviewed following labs and imaging studies  CBC: Recent Labs  Lab 08/07/20 1547  WBC 39.0*  39.2*  NEUTROABS 3.0  HGB 12.3  12.3  HCT 35.4*  35.7*  MCV 88.7  89.0  PLT 215  950   Basic Metabolic Panel: Recent Labs  Lab 08/08/20 1654 08/08/20 2230 08/09/20 0336 08/09/20 0800 08/10/20 0523  NA 126* 129* 126* 128* 130*  K 3.8 4.0 3.4* 3.6 4.0  CL 94* 95* 96* 96* 98  CO2 23 23 22 25 22   GLUCOSE 87 82 82 85 81  BUN 23 22 18 16 18   CREATININE 1.07* 1.15* 1.02* 0.98 1.41*  CALCIUM 9.4 9.5 9.2 9.4 9.7   GFR: Estimated Creatinine Clearance: 31.3 mL/min (A) (by C-G formula based on SCr of 1.41 mg/dL (H)). Liver Function Tests: No results for input(s): AST, ALT, ALKPHOS, BILITOT, PROT, ALBUMIN in the last 168 hours. No results for input(s): LIPASE, AMYLASE in the last 168 hours. No results for input(s): AMMONIA in the last 168 hours. Coagulation Profile: No results for input(s): INR, PROTIME in the last 168 hours. Cardiac Enzymes: No results for input(s): CKTOTAL, CKMB, CKMBINDEX, TROPONINI in the last 168 hours. BNP (last 3 results) No results for input(s): PROBNP in the last 8760 hours. HbA1C: Recent Labs    08/07/20 1547  HGBA1C 6.2*   CBG: No results for input(s): GLUCAP in the last 168 hours. Lipid Profile: Recent Labs    08/10/20 0523  CHOL 176  HDL 21*  LDLCALC 138*  TRIG 87  CHOLHDL 8.4   Thyroid Function Tests: No results for input(s): TSH, T4TOTAL, FREET4, T3FREE, THYROIDAB in the last 72 hours. Anemia Panel: Recent Labs    08/08/20 1026  FERRITIN  69   Sepsis Labs: Recent Labs  Lab 08/08/20 1026  PROCALCITON <0.10    Recent Results (from the past 240 hour(s))  SARS CORONAVIRUS 2 (TAT 6-24 HRS) Nasopharyngeal Nasopharyngeal Swab     Status: Abnormal   Collection Time: 08/07/20  6:00 PM   Specimen: Nasopharyngeal Swab  Result Value Ref Range Status   SARS Coronavirus 2 POSITIVE (A) NEGATIVE Final    Comment: (NOTE) SARS-CoV-2 target nucleic acids are DETECTED.  The SARS-CoV-2 RNA is generally detectable in upper and lower respiratory specimens during the acute phase of infection. Positive results are indicative of the presence of SARS-CoV-2 RNA. Clinical correlation with patient history and other diagnostic information is  necessary to determine patient infection status. Positive results do not rule out bacterial infection or co-infection with other viruses.  The expected result is Negative.  Fact Sheet for Patients: SugarRoll.be  Fact Sheet for  Healthcare Providers: https://www.woods-mathews.com/  This test is not yet approved or cleared by the [place] and  has been authorized for detection and/or diagnosis of SARS-CoV-2 by FDA under an Emergency Use Authorization (EUA). This EUA will remain  in effect (meaning this test can be used) for the duration of the COVID-19 declaration under Section 564(b)(1) of the Act, 21 U. S.C. section 360bbb-3(b)(1), unless the authorization is terminated or revoked sooner.   Performed at [facility], [place] [address] [place], [place] [address]      Radiology Studies: MR BRAIN WO CONTRAST  Result Date: 08/08/2020 CLINICAL DATA:  Neuro deficit, acute stroke suspected. EXAM: MRI HEAD WITHOUT CONTRAST TECHNIQUE: Multiplanar, multiecho pulse sequences of the brain and surrounding structures were obtained without intravenous contrast. COMPARISON:  Same day head CT. FINDINGS: Brain: Motion limited study. Punctate acute infarct in  the high paramidline right frontoparietal cortex (see series 9, images 33 and 34). Minimal associated edema without mass effect. Additional confluent T2/FLAIR hyperintensities within the white matter, compatible with advanced chronic microvascular ischemic disease. Remote lacunar infarcts in the left basal ganglia and inferior right cerebellum. No acute hemorrhage. Punctate areas of susceptibility artifact within bilateral thalami, right basal ganglia and left frontal white matter, compatible with prior microhemorrhages. No hydrocephalus. No midline shift. No extra-axial fluid collection. No mass lesion. Mild to moderate generalized cerebral atrophy with ex vacuo ventricular dilation. Vascular: Major arterial flow voids are maintained at the skull base. Skull and upper cervical spine: Normal marrow signal. Sinuses/Orbits: Mild ethmoid air cell mucosal thickening without air-fluid levels. Unremarkable orbits. Other: Small right mastoid effusion. IMPRESSION: 1. Punctate acute infarct in the high paramidline right frontoparietal cortex. Minimal associated edema without mass effect. 2. Remote lacunar infarcts in the left basal ganglia and right cerebellum. 3. Advanced chronic microvascular ischemic disease. Electronically Signed   By: Margaretha Sheffield MD   On: 08/08/2020 16:59    Scheduled Meds: . amiodarone  200 mg Oral Daily  . vitamin C  500 mg Oral Daily  . aspirin EC  81 mg Oral Daily  . atorvastatin  80 mg Oral Daily  . buPROPion  150 mg Oral QHS  . clopidogrel  75 mg Oral Daily  . DULoxetine  60 mg Oral Daily  . enoxaparin (LOVENOX) injection  40 mg Subcutaneous Q24H  . gabapentin  400 mg Oral BID  . hydrALAZINE  25 mg Oral Daily  . isosorbide mononitrate  60 mg Oral Daily  . lisinopril  40 mg Oral Daily  . loratadine  10 mg Oral Daily  . metoprolol succinate  25 mg Oral Daily  . mometasone-formoterol  2 puff Inhalation BID  . pantoprazole  40 mg Oral Daily  . potassium chloride  10 mEq Oral  Daily  . senna  1 tablet Oral BID  . zinc sulfate  220 mg Oral Daily   Continuous Infusions: . lactated ringers 100 mL/hr at 08/10/20 0933     LOS: 2 days   Time spent: 30 minutes.  Lorella Nimrod, MD [facility]  If 7PM-7AM, please contact night-coverage [email or web address]  08/10/2020, 3:07 PM   This record has been created using Systems analyst. Errors have been sought and corrected,but may not always be located. Such creation errors do not reflect on the standard of care.

## 2020-08-10 NOTE — TOC Initial Note (Addendum)
Transition of Care [facility]) - Initial/Assessment Note    Patient Details  Name: Karen [name] MRN: [id number] Date of Birth: 03/15/1946  [facility]) CM/SW Contact:    Magnus Ivan, LCSW Phone Number: 08/10/2020, 10:44 AM  Clinical Narrative:            Patient on Airborne Precautions. Called into room. Patient reported she lives with her daughter who provides transportation. PCP is Dr. Humphrey Rolls. Pharmacy is [facility]. No DME, HH, or SNF history. Confirmed home address listed in chart. Patient agreeable to PT/OT recommendations for Home Health, shower chair, and RW. No agency preference. Referral made to [facility] for 3 in 1 and RW. Referral accepted by [facility] Representative [place].    Expected Discharge Plan: [place] Barriers to Discharge: Continued Medical Work up   Patient Goals and CMS Choice Patient states their goals for this hospitalization and ongoing recovery are:: home with home health CMS Medicare.gov Compare Post Acute Care list provided to:: Patient Choice offered to / list presented to : Patient  Expected Discharge Plan and Services Expected Discharge Plan: [place]       Living arrangements for the past 2 months: [place]                 DME Arranged: 3-N-1,Walker rolling DME Agency: [employer] Date DME Agency Contacted: 08/10/20   Representative spoke with at DME Agency: Nash Shearer [facility] Arranged: PT,OT          Prior Living Arrangements/Services Living arrangements for the past 2 months: Single Family Home Lives with:: Adult Children Patient language and need for interpreter reviewed:: Yes Do you feel safe going back to the place where you live?: Yes      Need for Family Participation in Patient Care: Yes (Comment) Care giver support system in place?: Yes (comment)   Criminal Activity/Legal Involvement Pertinent to Current Situation/Hospitalization:  No - Comment as needed  Activities of Daily Living Home Assistive Devices/Equipment: None ADL Screening (condition at time of admission) Patient's cognitive ability adequate to safely complete daily activities?: Yes Is the patient deaf or have difficulty hearing?: No Does the patient have difficulty seeing, even when wearing glasses/contacts?: No Does the patient have difficulty concentrating, remembering, or making decisions?: No Patient able to express need for assistance with ADLs?: Yes Does the patient have difficulty dressing or bathing?: No Independently performs ADLs?: Yes (appropriate for developmental age) Communication: Independent Dressing (OT): Independent Grooming: Independent Feeding: Independent Bathing: Needs assistance Is this a change from baseline?: Change from baseline, expected to last <3 days Toileting: Independent In/Out Bed: Independent Walks in Home: Independent Does the patient have difficulty walking or climbing stairs?: Yes Weakness of Legs: Both Weakness of Arms/Hands: None  Permission Sought/Granted Permission sought to share information with : Chartered certified accountant granted to share information with : Yes, Verbal Permission Granted     Permission granted to share info w AGENCY: [place] and DME agencies        Emotional Assessment       Orientation: : Oriented to Self,Oriented to Place,Oriented to  Time,Oriented to Situation Alcohol / Substance Use: Not Applicable Psych Involvement: No (comment)  Admission diagnosis:  Hyponatremia [E87.1] Weakness [R53.1] AKI (acute kidney injury) ([place]) [N17.9] Patient Active Problem List   Diagnosis Date Noted  . Weakness   . AKI (acute kidney injury) ([place])   . Hyponatremia 08/07/2020  . Dysgeusia 08/07/2020  .  CLL (chronic lymphocytic leukemia) ([place]) 12/18/2017  . Unstable angina ([place]) 06/22/2017  . Inclusion cyst of vulva 12/14/2016  . Lumbar spondylosis 08/14/2016  . Trochanteric  bursitis of right hip 08/14/2016  . Herpes simplex virus (HSV) infection 12/09/2015  . History of total vaginal hysterectomy (TVH) 11/25/2015  . Vaginal odor 11/25/2015  . History of UTI 11/25/2015  . Vaginal atrophy 11/25/2015  . Dyspareunia in female 11/25/2015  . Tendinitis of wrist 05/26/2015   PCP:  Perrin Maltese, MD Pharmacy[facility]   [id number] [place], [place] - [address] [place] [address] Phone: [phone] Fax: [phone]     Social Determinants of Health (SDOH) Interventions    Readmission Risk Interventions No flowsheet data found.

## 2020-08-10 NOTE — Progress Notes (Signed)
Physical Therapy Treatment Patient Details Name: Karen [name] MRN: [id number] DOB: June 26, 1946 Today's Date: 08/10/2020    History of Present Illness Pt admitted for weakness/fatigue and loss of taste and smell. Pt now positive with Covid. History includes CLL, CAD, HLD, and HTN. Of note, now diagnosed with acute CVA.    PT Comments    Pt was in semi supine position upon arriving. Agrees to PT session and is cooperative throughout. Was easily able to exit R side of bed, stand and ambulate. Distances limited by pt needing to urinate. Ambulated into BR and urinated prior to requesting to  Return to bed. Pt is progressing well and able to tolerate being on rm air only. Recommend HHPT at DC to improve safety with ADLs.    Follow Up Recommendations  Home health PT;Supervision for mobility/OOB     Equipment Recommendations  Rolling walker with 5" wheels    Recommendations for Other Services       Precautions / Restrictions Precautions Precautions: Fall Restrictions Weight Bearing Restrictions: No    Mobility  Bed Mobility Overal bed mobility: Needs Assistance Bed Mobility: Supine to Sit;Sit to Supine     Supine to sit: Supervision        Transfers Overall transfer level: Needs assistance Equipment used: Rolling walker (2 wheeled) Transfers: Sit to/from Stand Sit to Stand: Supervision            Ambulation/Gait Ambulation/Gait assistance: Supervision Gait Distance (Feet): 50 Feet Assistive device: Rolling walker (2 wheeled) Gait Pattern/deviations: Step-through pattern Gait velocity: WNL   General Gait Details: pt was able to ambulate ~ 50 ft prior to requesting to use BR.       Balance Overall balance assessment: Needs assistance Sitting-balance support: Feet supported Sitting balance-Leahy Scale: Good Sitting balance - Comments: no LOB on sitting   Standing balance support: Bilateral upper extremity supported;During functional activity Standing  balance-Leahy Scale: Good         Cognition Arousal/Alertness: Awake/alert Behavior During Therapy: WFL for tasks assessed/performed Overall Cognitive Status: Within Functional Limits for tasks assessed             Pertinent Vitals/Pain Pain Assessment: No/denies pain           PT Goals (current goals can now be found in the care plan section) Acute Rehab PT Goals Patient Stated Goal: to go home Progress towards PT goals: Progressing toward goals    Frequency    7X/week      PT Plan Current plan remains appropriate       AM-PAC PT "6 Clicks" Mobility   Outcome Measure  Help needed turning from your back to your side while in a flat bed without using bedrails?: None Help needed moving from lying on your back to sitting on the side of a flat bed without using bedrails?: None Help needed moving to and from a bed to a chair (including a wheelchair)?: A Little Help needed standing up from a chair using your arms (e.g., wheelchair or bedside chair)?: A Little Help needed to walk in hospital room?: A Little Help needed climbing 3-5 steps with a railing? : A Little 6 Click Score: 20    End of Session Equipment Utilized During Treatment: Gait belt Activity Tolerance: Patient tolerated treatment well Patient left: in bed;with bed alarm set;with call bell/phone within reach Nurse Communication: Mobility status PT Visit Diagnosis: Unsteadiness on feet (R26.81);Muscle weakness (generalized) (M62.81);Difficulty in walking, not elsewhere classified (R26.2)     Time: 2595-6387 PT Time  Calculation (min) (ACUTE ONLY): 17 min  Charges:  $Gait Training: 8-22 mins                     Julaine Fusi PTA 08/10/20, 12:02 PM

## 2020-08-10 NOTE — Progress Notes (Signed)
*  PRELIMINARY RESULTS* Echocardiogram 2D Echocardiogram has been performed.  [name] 08/10/2020, 9:29 AM

## 2020-08-10 NOTE — Consult Note (Addendum)
NEUROLOGY CONSULTATION NOTE   Date of service: August 10, 2020 Patient Name: Karen [name] MRN:  [id number] DOB:  1945-12-28 Reason for consult: "Stroke" _ _ _   _ __   _ __ _ _  __ __   _ __   __ _  History of Present Illness  Karen [name] is a 75 y.o. female with PMH significant for CLL, HLD, HTN, MI, Restless leg syndrome, Sleep Apnea admitted with dysgeusia, hyponatremia, poor po intake, COVID + with no respiratory symptoms. Workup with MRI Brain demonstrated a small subcortical R paramedian stroke in the frontoparietal region.  We were asked for assistance with stroke workup.  Patient denies any arm or leg weakness, no numbness, no dysarthria, no word finding difficulties, no vision problems, no incoordination. Denies any prior hx of strokes. Mother had strokes. Takes plavix daily at home. Does endorse smoking everyday. She endorses being on something for high cholesterol but she stopped taking it by herself.   ROS   Constitutional Denies weight loss, fever and chills.   HEENT Denies changes in vision and hearing.   Respiratory Denies SOB and cough.   CV Denies palpitations and CP   GI Denies abdominal pain, nausea, vomiting and diarrhea.   GU Denies dysuria and urinary frequency.   MSK Denies myalgia and joint pain.   Skin Denies rash and pruritus.   Neurological Denies headache and syncope.   Psychiatric Denies recent changes in mood. Denies anxiety and depression.    Past History   Past Medical History:  Diagnosis Date  . Anxiety   . Artery occlusion   . Asthma   . Back pain   . Bronchitis   . Cancer ([place])    Skin Cancer  . CLL (chronic lymphocytic leukemia) ([place])   . Coronary artery disease   . Depression   . Dyspnea   . GERD (gastroesophageal reflux disease)   . History of kidney stones   . Hypercholesteremia   . Hyperlipemia   . Hypertension   . Hypothyroidism   . Myocardial infarction ([place]) 1999  . Neuropathy   . Restless leg syndrome   . Sleep apnea     use C-PAP  . Wears dentures    full upper, partial lower.  doesn't currently wear   Past Surgical History:  Procedure Laterality Date  . COLONOSCOPY WITH PROPOFOL N/A 11/20/2016   Procedure: COLONOSCOPY WITH PROPOFOL;  Surgeon: Lucilla Lame, MD;  Location: [place];  Service: Endoscopy;  Laterality: N/A;  sleep apnea  . CORONARY ANGIOPLASTY WITH STENT PLACEMENT  2008  . DILATION AND CURETTAGE OF UTERUS    . EYE SURGERY Bilateral    Cataract Extraction with IOL  . HAND SURGERY Left 12/2017   upper hand fracture repair  . LEFT HEART CATH AND CORONARY ANGIOGRAPHY N/A 06/29/2017   Procedure: LEFT HEART CATH AND CORONARY ANGIOGRAPHY;  Surgeon: Dionisio David, MD;  Location: [place] [facility];  Service: Cardiovascular;  Laterality: N/A;  . LEFT HEART CATH AND CORONARY ANGIOGRAPHY Left 12/02/2018   Procedure: LEFT HEART CATH AND CORONARY ANGIOGRAPHY;  Surgeon: Dionisio David, MD;  Location: [facility];  Service: Cardiovascular;  Laterality: Left;  . OPEN REDUCTION INTERNAL FIXATION (ORIF) DISTAL RADIAL FRACTURE Right 08/12/2018   Procedure: OPEN REDUCTION INTERNAL FIXATION (ORIF) DISTAL RADIAL FRACTURE;  Surgeon: [name], MD;  Location: [facility];  Service: Orthopedics;  Laterality: Right;  . POLYPECTOMY  11/20/2016   Procedure: POLYPECTOMY;  Surgeon: Lucilla Lame, MD;  Location: [place];  Service: Endoscopy;;  . TUBAL LIGATION    . VAGINAL HYSTERECTOMY    . VULVECTOMY Right 01/31/2016   Procedure: WIDE EXCISION VULVECTOMY-RIGHT LABIA;  Surgeon: Brayton Mars, MD;  Location: [facility];  Service: Gynecology;  Laterality: Right;   Family History  Problem Relation Age of Onset  . Heart disease Father   . Stroke Mother   . Hypertension Mother   . Lung cancer Brother   . Heart attack Brother   . Congestive Heart Failure Sister   . Cervical cancer Sister   . Cancer Neg Hx   . Diabetes Neg Hx   . Breast cancer Neg Hx    Social History    Socioeconomic History  . Marital status: Single    Spouse name: Not on file  . Number of children: 5  . Years of education: Not on file  . Highest education level: Not on file  Occupational History  . Occupation: part time job    Comment: Engineer, building services  Tobacco Use  . Smoking status: Current Every Day Smoker    Packs/day: 1.00    Years: 35.00    Pack years: 35.00    Types: Cigarettes  . Smokeless tobacco: Current User    Types: Snuff  Vaping Use  . Vaping Use: Never used  Substance and Sexual Activity  . Alcohol use: No    Alcohol/week: 0.0 standard drinks  . Drug use: No  . Sexual activity: Not Currently    Partners: Male    Birth control/protection: Surgical  Other Topics Concern  . Not on file  Social History Narrative  . Not on file   Social Determinants of Health   Financial Resource Strain: Not on file  Food Insecurity: Not on file  Transportation Needs: Not on file  Physical Activity: Not on file  Stress: Not on file  Social Connections: Not on file   Allergies  Allergen Reactions  . Requip [Ropinirole Hcl] Anaphylaxis    Throat closing  . Celebrex [Celecoxib] Itching  . Meloxicam Palpitations  . Nitrofurantoin Palpitations    Medications   Medications Prior to Admission  Medication Sig Dispense Refill Last Dose  . acetaminophen (TYLENOL) 500 MG tablet Take 500-1,000 mg by mouth every 6 (six) hours as needed for moderate pain or fever.   Unknown at PRN  . albuterol (VENTOLIN HFA) 108 (90 Base) MCG/ACT inhaler Inhale 1-2 puffs into the lungs every 6 (six) hours as needed for wheezing or shortness of breath.   Unknown at PRN  . budesonide-formoterol (SYMBICORT) 160-4.5 MCG/ACT inhaler Inhale 2 puffs into the lungs 2 (two) times daily as needed (shortness of breath).   24+ hours at Unknown  . buPROPion (WELLBUTRIN XL) 150 MG 24 hr tablet Take 150 mg by mouth at bedtime.   24+ hours at Unknown  . chlorthalidone (HYGROTON) 25 MG tablet Take 25 mg by mouth  daily.   24+ hours at Unknown  . clopidogrel (PLAVIX) 75 MG tablet Take 75 mg by mouth daily.    24+ hours at Unknown  . dexlansoprazole (DEXILANT) 60 MG capsule Take 60 mg by mouth daily.    24+ hours at Unknown  . fluticasone (FLONASE) 50 MCG/ACT nasal spray Place 2 sprays into both nostrils daily as needed for allergies or rhinitis.   Unknown at PRN  . gabapentin (NEURONTIN) 400 MG capsule Take 400 mg by mouth 3 (three) times daily.   24+ hours at Unknown  . isosorbide mononitrate (IMDUR)  60 MG 24 hr tablet Take 60 mg by mouth daily.   24+ hours at Unknown  . lisinopril (PRINIVIL,ZESTRIL) 40 MG tablet Take 40 mg by mouth daily.   24+ hours at Unknown  . loratadine (CLARITIN) 10 MG tablet Take 10 mg by mouth daily.     . metoprolol succinate (TOPROL-XL) 25 MG 24 hr tablet Take 25 mg by mouth daily.    24+ hours at Unknown  . potassium chloride (K-DUR,KLOR-CON) 10 MEQ tablet Take 10 mEq by mouth daily.     Marland Kitchen REPATHA 140 MG/ML SOSY Inject 140 mg into the skin every 14 (fourteen) days.     . Tetrahydrozoline HCl (VISINE OP) Place 1 drop into both eyes daily as needed (dry eyes).   Unknown at PRN     Vitals   Vitals:   08/09/20 2127 08/10/20 0457 08/10/20 0829 08/10/20 1153  BP: 122/69 132/77 120/67 (!) 94/49  Pulse: 60 61 (!) 52 (!) 52  Resp: 18 18 17 16   Temp: 98.1 F (36.7 C) 98.2 F (36.8 C) (!) 97.2 F (36.2 C) 97.8 F (36.6 C)  TempSrc: Oral Oral  Oral  SpO2: 94% 95% 96% 93%  Weight:      Height:         Body mass index is 27.8 kg/m.  Physical Exam   General: Laying comfortably in bed; in no acute distress.  HENT: Normal oropharynx and mucosa. Normal external appearance of ears and nose.  Neck: Supple, no pain or tenderness  CV: No JVD. No peripheral edema.  Pulmonary: Symmetric Chest rise. Normal respiratory effort.  Abdomen: Soft to touch, non-tender.  Ext: No cyanosis, edema, or deformity  Skin: No rash. Normal palpation of skin.   Musculoskeletal: Normal digits  and nails by inspection. No clubbing.   Neurologic Examination  Mental status/Cognition: Alert, oriented to self, place, month and year, good attention.  Speech/language: Fluent, comprehension intact, object naming intact, repetition intact.  Cranial nerves:   CN II Pupils equal and reactive to light, no VF deficits    CN III,IV,VI EOM intact, no gaze preference or deviation, no nystagmus    CN V normal sensation in V1, V2, and V3 segments bilaterally    CN VII no asymmetry, no nasolabial fold flattening    CN VIII normal hearing to speech    CN IX & X normal palatal elevation, no uvular deviation    CN XI 5/5 head turn and 5/5 shoulder shrug bilaterally    CN XII midline tongue protrusion    Motor:  Muscle bulk: normal, tone normal, pronator drift none tremor none Mvmt Root Nerve  Muscle Right Left Comments  SA C5/6 Ax Deltoid 5 5   EF C5/6 Mc Biceps 5 5   EE C6/7/8 Rad Triceps 5 5   WF C6/7 Med FCR 5 5   WE C7/8 PIN ECU 5 5   F Ab C8/T1 U ADM/FDI 5 5   HF L1/2/3 Fem Illopsoas 5 5   KE L2/3/4 Fem Quad 5 5   DF L4/5 D Peron Tib Ant 5 5   PF S1/2 Tibial Grc/Sol 5 5    Reflexes:  Right Left Comments  Pectoralis      Biceps (C5/6) 1 1   Brachioradialis (C5/6) 1 1    Triceps (C6/7) 1 1    Patellar (L3/4) 1 1    Achilles (S1)      Hoffman      Plantar     Jaw jerk  Sensation:  Light touch Intact throughout   Pin prick    Temperature    Vibration   Proprioception    Coordination/Complex Motor:  - Finger to Nose intact BL - Heel to shin intact BL - Rapid alternating movement are normal - Gait: Did not assess.  Labs   CBC:  Recent Labs  Lab 08/07/20 1547  WBC 39.0*  39.2*  NEUTROABS 3.0  HGB 12.3  12.3  HCT 35.4*  35.7*  MCV 88.7  89.0  PLT 215  009    Basic Metabolic Panel:  Lab Results  Component Value Date   NA 130 (L) 08/10/2020   K 4.0 08/10/2020   CO2 22 08/10/2020   GLUCOSE 81 08/10/2020   BUN 18 08/10/2020   CREATININE 1.41 (H)  08/10/2020   CALCIUM 9.7 08/10/2020   GFRNONAA 39 (L) 08/10/2020   GFRAA >60 03/24/2020   Lipid Panel:  Lab Results  Component Value Date   LDLCALC 138 (H) 08/10/2020   HgbA1c:  Lab Results  Component Value Date   HGBA1C 6.2 (H) 08/07/2020   Urine Drug Screen: No results found for: LABOPIA, COCAINSCRNUR, LABBENZ, AMPHETMU, THCU, LABBARB  Alcohol Level No results found for: [facility]  MRI Brain  1. Punctate acute infarct in the high paramidline right frontoparietal cortex. Minimal associated edema without mass effect. 2. Remote lacunar infarcts in the left basal ganglia and right cerebellum. 3. Advanced chronic microvascular ischemic disease.  TTE: Pending  Carotid duplex: Pending   MR Agnio head without contrast: pending   Impression   Karen [name] is a 75 y.o. female who we saw for a small subcortical R paramedian stroke in the frontoparietal region. Her neurologic examination with no focal deficit.  Etiology of stroke is likely small vessl disease.  Recommendations   - Frequent Neuro checks per stroke unit protocol - I ordered MRA Angio Head without contrast - Carotid duplex pending. - TTE pending - I ordered Atorvastatin 80mg  daily - discused with patient side effects including myalgias and hepatic dysfunction. - Antithrombotic - Continue home Plavix 75mg  daily - Recommend DVT ppx - Recommend Telemetry monitoring for arrythmia - Recommend bedside swallow screen prior to PO intake. - Stroke education booklet - Recommend PT/OT/SLP consult - Counseled on the importance of quitting smoking to prevent strokes in the future. She is not ready to quit, responds with "I dont know". - Follow up with stroke clinic with Dr. Leonie Man at [facility].  ______________________________________________________________________   Thank you for the opportunity to take part in the care of this patient. If you have any further questions, please contact the neurology  consultation attending.  Signed,  [place] Pager Number [phone] _ _ _   _ __   _ __ _ _  __ __   _ __   __ _

## 2020-08-11 DIAGNOSIS — E871 Hypo-osmolality and hyponatremia: Secondary | ICD-10-CM | POA: Diagnosis not present

## 2020-08-11 DIAGNOSIS — N179 Acute kidney failure, unspecified: Secondary | ICD-10-CM | POA: Diagnosis not present

## 2020-08-11 LAB — CBC
HCT: 32.3 % — ABNORMAL LOW (ref 36.0–46.0)
Hemoglobin: 10.8 g/dL — ABNORMAL LOW (ref 12.0–15.0)
MCH: 30.3 pg (ref 26.0–34.0)
MCHC: 33.4 g/dL (ref 30.0–36.0)
MCV: 90.5 fL (ref 80.0–100.0)
Platelets: 168 10*3/uL (ref 150–400)
RBC: 3.57 MIL/uL — ABNORMAL LOW (ref 3.87–5.11)
RDW: 13.2 % (ref 11.5–15.5)
WBC: 29.3 10*3/uL — ABNORMAL HIGH (ref 4.0–10.5)
nRBC: 0 % (ref 0.0–0.2)

## 2020-08-11 LAB — BASIC METABOLIC PANEL
Anion gap: 9 (ref 5–15)
BUN: 22 mg/dL (ref 8–23)
CO2: 23 mmol/L (ref 22–32)
Calcium: 9.7 mg/dL (ref 8.9–10.3)
Chloride: 98 mmol/L (ref 98–111)
Creatinine, Ser: 1.44 mg/dL — ABNORMAL HIGH (ref 0.44–1.00)
GFR, Estimated: 38 mL/min — ABNORMAL LOW (ref >60)
Glucose, Bld: 77 mg/dL (ref 70–99)
Potassium: 4 mmol/L (ref 3.5–5.1)
Sodium: 130 mmol/L — ABNORMAL LOW (ref 135–145)

## 2020-08-11 NOTE — Progress Notes (Signed)
PROGRESS NOTE    Karen [name]  JQB:[id number] DOB: 12/01/1945 DOA: 08/07/2020 PCP: Perrin Maltese, MD   Brief Narrative: Taken from H&P.  Karen [name] is a 75 y.o. female with medical history significant of CLL, CAD, HLD, HTN, hypothyroidism, RLS, OSA. For several days she reports that everything tastes too sweet and she has not been able to eat or drink regular fluids due to associated nausea w/o emesis. She does report that she has been drinking large amounts of water. Due to her symptoms she presents to [facility] for evaulation. Patient received 1 dose of most likely Pfizer last year, never completed the vaccination.  Labs positive for hyponatremia with sodium of 121, creatinine of 1.33, leukocytosis which is chronic at 39 with 87% lymphocytes and smudge cells. Chest x-ray without any acute abnormality. CT head was done with concern of altered taste, And it shows a small low-attenuation inferior to the head of left caudate nucleus and they are recommending follow-up with MRI, which was positive for a small punctate infarct.  Neurology was consulted and she is completing stroke work-up, no new deficit.  No hyponatremia  labs but she did receive normal saline with some improvement in sodium, hyponatremia labs obtained after getting IV fluid and it shows hypoosmolar hyponatremia.  IV fluid discontinued.  Sodium at 130 today.  Subjective: Pt still complained of foods tasting bad.  No dyspnea.     Assessment & Plan:   Active Problems:   CLL (chronic lymphocytic leukemia) (HCC)   Hyponatremia   Dysgeusia  Hyponatremia.   Sodium remains stable at 130 this morning, hyponatremia labs with hypoosmolar hyponatremia.  Patient admitted drinking a lot of free water. She did received some normal saline.  --Monitor for now  Dysgeusia.  Patient with altered taste and a questionable area on CT head. Can be due to COVID.  MRI with small punctate acute infarct in high paramidline right frontal lobe  parietal cortex, and some remote lacunar infarcts involving left basal ganglia and right cerebellum. Patient is already on aspirin and Plavix.  No new deficit. --liberalize diet to regular  small subcortical R paramedian stroke in the frontoparietal region.  --Her neurologic examination with no focal deficit. --MR Angio head without contrast and Carotid duplex with no hemodynamically significant narrowing. TTE with an EF of 65-70% and no atrial level shunt with color flow doppler. --etiology of stroke is small vessel disease Plan: --cont lipitor 80 mg daily --cont home plavix and ASA 81 --rec smoking cessation   AKI --present on admission, improved, then Cr worsened to 1.4 again on 1/18.   --Monitor for now  COVID-19 infection.  No respiratory symptoms.  Chest x-ray clear.  Inflammatory markers are within normal limits. -Completed 3 days of remdesivir.  CLL.  No acute concern. -Continue outpatient follow-up with oncology.   Objective: Vitals:   08/10/20 2356 08/11/20 0351 08/11/20 0847 08/11/20 1558  BP: (!) 105/55 119/60 (!) 129/54 (!) 97/55  Pulse: 60 (!) 55 (!) 57 (!) 58  Resp: 18  17 19   Temp: 98.2 F (36.8 C) 98.4 F (36.9 C) 98.2 F (36.8 C) 98.2 F (36.8 C)  TempSrc: Oral Oral    SpO2: 97% 95% 93% 94%  Weight:      Height:       No intake or output data in the 24 hours ending 08/11/20 1653 Filed Weights   08/07/20 1536  Weight: 68.9 kg    Examination:  Constitutional: NAD, AAOx3 HEENT: conjunctivae and lids normal, EOMI  CV: No cyanosis.   RESP: clear, normal respiratory effort, on RA Extremities: No effusions, edema in BLE SKIN: warm, dry Neuro: II - XII grossly intact.   Psych: depressed mood and affect.      DVT prophylaxis: Lovenox Code Status: Full Family Communication:  Disposition Plan:  Status is: Inpatient  Remains inpatient appropriate because:Inpatient level of care appropriate due to severity of illness   Dispo: The patient is  from: Home              Anticipated d/c is to: Home              Anticipated d/c date is: 1-2 days              Patient currently is not medically stable to d/c.  New AKI.   Consultants:   Neurology  Procedures:  Antimicrobials:   Data Reviewed: I have personally reviewed following labs and imaging studies  CBC: Recent Labs  Lab 08/07/20 1547 08/11/20 0559  WBC 39.0*  39.2* 29.3*  NEUTROABS 3.0  --   HGB 12.3  12.3 10.8*  HCT 35.4*  35.7* 32.3*  MCV 88.7  89.0 90.5  PLT 215  208 326   Basic Metabolic Panel: Recent Labs  Lab 08/08/20 2230 08/09/20 0336 08/09/20 0800 08/10/20 0523 08/11/20 0559  NA 129* 126* 128* 130* 130*  K 4.0 3.4* 3.6 4.0 4.0  CL 95* 96* 96* 98 98  CO2 23 22 25 22 23   GLUCOSE 82 82 85 81 77  BUN 22 18 16 18 22   CREATININE 1.15* 1.02* 0.98 1.41* 1.44*  CALCIUM 9.5 9.2 9.4 9.7 9.7   GFR: Estimated Creatinine Clearance: 30.7 mL/min (A) (by C-G formula based on SCr of 1.44 mg/dL (H)). Liver Function Tests: No results for input(s): AST, ALT, ALKPHOS, BILITOT, PROT, ALBUMIN in the last 168 hours. No results for input(s): LIPASE, AMYLASE in the last 168 hours. No results for input(s): AMMONIA in the last 168 hours. Coagulation Profile: No results for input(s): INR, PROTIME in the last 168 hours. Cardiac Enzymes: No results for input(s): CKTOTAL, CKMB, CKMBINDEX, TROPONINI in the last 168 hours. BNP (last 3 results) No results for input(s): PROBNP in the last 8760 hours. HbA1C: No results for input(s): HGBA1C in the last 72 hours. CBG: No results for input(s): GLUCAP in the last 168 hours. Lipid Profile: Recent Labs    08/10/20 0523  CHOL 176  HDL 21*  LDLCALC 138*  TRIG 87  CHOLHDL 8.4   Thyroid Function Tests: No results for input(s): TSH, T4TOTAL, FREET4, T3FREE, THYROIDAB in the last 72 hours. Anemia Panel: No results for input(s): VITAMINB12, FOLATE, FERRITIN, TIBC, IRON, RETICCTPCT in the last 72 hours. Sepsis  Labs: Recent Labs  Lab 08/08/20 1026  PROCALCITON <0.10    Recent Results (from the past 240 hour(s))  SARS CORONAVIRUS 2 (TAT 6-24 HRS) Nasopharyngeal Nasopharyngeal Swab     Status: Abnormal   Collection Time: 08/07/20  6:00 PM   Specimen: Nasopharyngeal Swab  Result Value Ref Range Status   SARS Coronavirus 2 POSITIVE (A) NEGATIVE Final    Comment: (NOTE) SARS-CoV-2 target nucleic acids are DETECTED.  The SARS-CoV-2 RNA is generally detectable in upper and lower respiratory specimens during the acute phase of infection. Positive results are indicative of the presence of SARS-CoV-2 RNA. Clinical correlation with patient history and other diagnostic information is  necessary to determine patient infection status. Positive results do not rule out bacterial infection or co-infection with other viruses.  The expected result is Negative.  Fact Sheet for Patients: SugarRoll.be  Fact Sheet for Healthcare Providers: https://www.woods-mathews.com/  This test is not yet approved or cleared by the [place] FDA and  has been authorized for detection and/or diagnosis of SARS-CoV-2 by FDA under an Emergency Use Authorization (EUA). This EUA will remain  in effect (meaning this test can be used) for the duration of the COVID-19 declaration under Section 564(b)(1) of the Act, 21 U. S.C. section 360bbb-3(b)(1), unless the authorization is terminated or revoked sooner.   Performed at [facility], [place] [address], [place], [place] [address]      Radiology Studies: MR ANGIO HEAD WO CONTRAST  Result Date: 08/10/2020 CLINICAL DATA:  Acute stroke on recent MRI. EXAM: MRA HEAD WITHOUT CONTRAST TECHNIQUE: Angiographic images of the Circle of Willis were obtained using MRA technique without intravenous contrast. COMPARISON:  MRI head 08/08/2020 FINDINGS: Anterior circulation: Bilateral internal carotid arteries are patent through the carotid  siphons. Bilateral M1 and proximal M2 vessels are patent. Bilateral ACA vessels are patent. No visible hemodynamically significant proximal arterial stenosis. Multifocal mild atherosclerotic narrowing throughout the anterior circulation. No aneurysm identified. Posterior circulation: Left dominant vertebral artery. Bilateral vertebral arteries are patent. Suspected moderate to severe narrowing of the right vertebral artery as it becomes intradural, although this area is not well evaluated on this study. By basilar artery and bilateral PCAs are patent. Multifocal mild atherosclerotic narrowing throughout the posterior circulation. IMPRESSION: 1. No emergent large vessel occlusion. 2. Suspected moderate to severe narrowing of the proximal intradural non dominant right vertebral artery, although this area is not well evaluated on this study. Otherwise, there is multifocal mild atherosclerotic narrowing without evidence of proximal hemodynamically significant stenosis. Electronically Signed   By: Margaretha Sheffield MD   On: 08/10/2020 14:14   US Carotid Bilateral  Result Date: 08/10/2020 CLINICAL DATA:  75 year old female with a history of stroke EXAM: BILATERAL CAROTID DUPLEX ULTRASOUND TECHNIQUE: Pearline Cables scale imaging, color Doppler and duplex ultrasound were performed of bilateral carotid and vertebral arteries in the neck. COMPARISON:  None. FINDINGS: Criteria: Quantification of carotid stenosis is based on velocity parameters that correlate the residual internal carotid diameter with NASCET-based stenosis levels, using the diameter of the distal internal carotid lumen as the denominator for stenosis measurement. The following velocity measurements were obtained: RIGHT ICA:  Systolic 97 cm/sec, Diastolic 33 cm/sec CCA:  82 cm/sec SYSTOLIC ICA/CCA RATIO:  1.0 ECA:  158 cm/sec LEFT ICA:  Systolic 96 cm/sec, Diastolic 21 cm/sec CCA:  89 cm/sec SYSTOLIC ICA/CCA RATIO:  1.1 ECA:  179 cm/sec Right Brachial SBP: Not  acquired Left Brachial SBP: Not acquired RIGHT CAROTID ARTERY: No significant calcifications of the right common carotid artery. Intermediate waveform maintained. Heterogeneous and partially calcified plaque at the right carotid bifurcation. No significant lumen shadowing. Low resistance waveform of the right ICA. No significant tortuosity. RIGHT VERTEBRAL ARTERY: Antegrade flow with low resistance waveform. LEFT CAROTID ARTERY: No significant calcifications of the left common carotid artery. Intermediate waveform maintained. Heterogeneous and partially calcified plaque at the left carotid bifurcation without significant lumen shadowing. Low resistance waveform of the left ICA. No significant tortuosity. LEFT VERTEBRAL ARTERY:  Antegrade flow with low resistance waveform. Lymph nodes in the lateral left neck with architecture maintained, borderline enlarged. IMPRESSION: Color duplex indicates minimal heterogeneous and calcified plaque, with no hemodynamically significant stenosis by duplex criteria in the extracranial cerebrovascular circulation. Borderline enlarged lymph nodes of the left neck, with typical architecture maintained, nonspecific  though potentially reactive. Signed, Dulcy Fanny. Dellia Nims, RPVI [facility] Radiology Electronically Signed   By: Corrie Mckusick D.O.   On: 08/10/2020 12:55   ECHOCARDIOGRAM COMPLETE  Result Date: 08/10/2020    ECHOCARDIOGRAM REPORT   Patient Name:   AYALA RIBBLE Date of Exam: 08/10/2020 Medical Rec #:  [id number]     Height:       62.0 in Accession #:    [id number]    Weight:       152.0 lb Date of Birth:  1945/08/28     BSA:          1.701 m Patient Age:    62 years      BP:           120/67 mmHg Patient Gender: F             HR:           52 bpm. Exam Location:  [facility] Procedure: 2D Echo, Cardiac Doppler and Color Doppler Indications:     Stroke 434.91/ I163.9  History:         Patient has no prior history of Echocardiogram  examinations.                  Previous Myocardial Infarction; Risk Factors:Hypertension and                  Sleep Apnea.  Sonographer:     [name] RDCS (AE) Referring Phys:  [id number] [facility] AMIN Diagnosing Phys: Nelva Bush MD IMPRESSIONS  1. Left ventricular ejection fraction, by estimation, is 65 to 70%. The left ventricle has normal function. The left ventricle has no regional wall motion abnormalities. There is mild left ventricular hypertrophy. Left ventricular diastolic parameters are consistent with Grade I diastolic dysfunction (impaired relaxation). Elevated left atrial pressure.  2. Right ventricular systolic function is normal. The right ventricular size is normal.  3. The mitral valve is degenerative. Mild mitral valve regurgitation. No evidence of mitral stenosis.  4. The aortic valve has an indeterminant number of cusps. There is mild calcification of the aortic valve. There is moderate thickening of the aortic valve. Aortic valve regurgitation is not visualized. Mild to moderate aortic valve sclerosis/calcification is present, without any evidence of aortic stenosis.  5. The inferior vena cava is normal in size with greater than 50% respiratory variability, suggesting right atrial pressure of 3 mmHg. FINDINGS  Left Ventricle: Left ventricular ejection fraction, by estimation, is 65 to 70%. The left ventricle has normal function. The left ventricle has no regional wall motion abnormalities. The left ventricular internal cavity size was normal in size. There is  mild left ventricular hypertrophy. Left ventricular diastolic parameters are consistent with Grade I diastolic dysfunction (impaired relaxation). Elevated left atrial pressure. The E/e' is 16.9. Right Ventricle: The right ventricular size is normal. No increase in right ventricular wall thickness. Right ventricular systolic function is normal. Left Atrium: Left atrial size was normal in size. Right Atrium: Right atrial size was normal  in size. Pericardium: There is no evidence of pericardial effusion. Mitral Valve: The mitral valve is degenerative in appearance. Mild to moderate mitral annular calcification. Mild mitral valve regurgitation. No evidence of mitral valve stenosis. Tricuspid Valve: The tricuspid valve is normal in structure. Tricuspid valve regurgitation is trivial. Aortic Valve: The aortic valve has an indeterminant number of cusps. There is mild calcification of the aortic valve. There is moderate thickening of the aortic valve. Aortic  valve regurgitation is not visualized. Mild to moderate aortic valve sclerosis/calcification is present, without any evidence of aortic stenosis. Aortic valve mean gradient measures 3.0 mmHg. Aortic valve peak gradient measures 7.4 mmHg. Aortic valve area, by VTI measures 2.90 cm. Pulmonic Valve: The pulmonic valve was not well visualized. Pulmonic valve regurgitation is mild. No evidence of pulmonic stenosis. Aorta: The aortic root is normal in size and structure. Pulmonary Artery: The pulmonary artery is not well seen. Venous: The inferior vena cava is normal in size with greater than 50% respiratory variability, suggesting right atrial pressure of 3 mmHg. IAS/Shunts: No atrial level shunt detected by color flow Doppler.  LEFT VENTRICLE PLAX 2D LVIDd:         3.60 cm  Diastology LVIDs:         2.50 cm  LV e' medial:    5.87 cm/s LV PW:         1.10 cm  LV E/e' medial:  16.9 LV IVS:        1.00 cm  LV e' lateral:   5.00 cm/s LVOT diam:     2.00 cm  LV E/e' lateral: 19.9 LV SV:         79 LV SV Index:   46 LVOT Area:     3.14 cm  RIGHT VENTRICLE RV S prime:     11.30 cm/s TAPSE (M-mode): 3.2 cm LEFT ATRIUM           Index       RIGHT ATRIUM           Index LA diam:      3.20 cm 1.88 cm/m  RA Area:     12.30 cm LA Vol (A4C): 44.2 ml 25.98 ml/m RA Volume:   22.90 ml  13.46 ml/m  AORTIC VALVE                   PULMONIC VALVE AV Area (Vmax):    2.45 cm    PV Vmax:        0.74 m/s AV Area (Vmean):    3.01 cm    PV Peak grad:   2.2 mmHg AV Area (VTI):     2.90 cm    RVOT Peak grad: 4 mmHg AV Vmax:           136.00 cm/s AV Vmean:          79.900 cm/s AV VTI:            0.272 m AV Peak Grad:      7.4 mmHg AV Mean Grad:      3.0 mmHg LVOT Vmax:         106.00 cm/s LVOT Vmean:        76.600 cm/s LVOT VTI:          0.251 m LVOT/AV VTI ratio: 0.92  AORTA Ao Root diam: 2.93 cm MITRAL VALVE MV Area (PHT): 2.71 cm     SHUNTS MV Decel Time: 280 msec     Systemic VTI:  0.25 m MV E velocity: 99.40 cm/s   Systemic Diam: 2.00 cm MV A velocity: 132.00 cm/s MV E/A ratio:  0.75 Harrell Gave End MD Electronically signed by Nelva Bush MD Signature Date/Time: 08/10/2020/5:58:06 PM    Final     Scheduled Meds: . amiodarone  200 mg Oral Daily  . vitamin C  500 mg Oral Daily  . aspirin EC  81 mg Oral Daily  . atorvastatin  80 mg Oral Daily  .  buPROPion  150 mg Oral QHS  . clopidogrel  75 mg Oral Daily  . DULoxetine  60 mg Oral Daily  . enoxaparin (LOVENOX) injection  40 mg Subcutaneous Q24H  . gabapentin  400 mg Oral BID  . hydrALAZINE  25 mg Oral Daily  . isosorbide mononitrate  60 mg Oral Daily  . loratadine  10 mg Oral Daily  . metoprolol succinate  25 mg Oral Daily  . mometasone-formoterol  2 puff Inhalation BID  . pantoprazole  40 mg Oral Daily  . potassium chloride  10 mEq Oral Daily  . senna  1 tablet Oral BID  . zinc sulfate  220 mg Oral Daily   Continuous Infusions:    LOS: 3 days     Enzo Bi, MD [facility]  If 7PM-7AM, please contact night-coverage [email or web address]  08/11/2020, 4:53 PM

## 2020-08-11 NOTE — Progress Notes (Signed)
Brief Neuro Update:  MR Angio head without contrast and Carotid duplex with no hemodynamically significant narrowing. TTE with an EF of 65-70% and no atrial level shunt with color flow doppler.  Stroke workup is completed and etiology of stroke is small vessel disease. Please see my consult note from 08/10/20 for full recommendations. Patient should follow up with Stroke clinic.  Neurology inpatient team will signoff. Please feel free to contact us with any questions or concerns.   [place] Pager Number [phone]

## 2020-08-11 NOTE — Progress Notes (Signed)
Physical Therapy Treatment Patient Details Name: Karen [name] MRN: [id number] DOB: 12/29/1945 Today's Date: 08/11/2020    History of Present Illness Pt admitted for weakness/fatigue and loss of taste and smell. Pt now positive with Covid. History includes CLL, CAD, HLD, and HTN. Of note, now diagnosed with acute CVA.    PT Comments    Pt was side lying in bed upon arriving. She agrees to session and is cooperative throughout. Easily able to get OOB and ambulate with use of RW. No LOB or difficulty however did endorse fatigue. Once returned to room pt reported her boyfriend is in hospital. Requested to see boyfriend who is also in [place] unit. RN aware. Pt will benefit from HHPT at DC to improve safety and balance with mobility. Acute PT will continue to follow per POC.     Follow Up Recommendations  Home health PT;Supervision for mobility/OOB     Equipment Recommendations  Rolling walker with 5" wheels    Recommendations for Other Services       Precautions / Restrictions Precautions Precautions: Fall Restrictions Weight Bearing Restrictions: No    Mobility  Bed Mobility Overal bed mobility: Needs Assistance Bed Mobility: Supine to Sit;Sit to Supine     Supine to sit: Supervision Sit to supine: Supervision      Transfers Overall transfer level: Needs assistance Equipment used: Rolling walker (2 wheeled) Transfers: Sit to/from Stand Sit to Stand: Supervision            Ambulation/Gait Ambulation/Gait assistance: Supervision Gait Distance (Feet): 200 Feet Assistive device: Rolling walker (2 wheeled) Gait Pattern/deviations: WFL(Within Functional Limits) Gait velocity: WNL   General Gait Details: no difficulty walking with use of RW       Balance Overall balance assessment: Needs assistance Sitting-balance support: Feet supported Sitting balance-Leahy Scale: Good Sitting balance - Comments: no LOB in sitting   Standing balance support: Bilateral upper  extremity supported;During functional activity Standing balance-Leahy Scale: Good      Cognition Arousal/Alertness: Awake/alert Behavior During Therapy: WFL for tasks assessed/performed Overall Cognitive Status: Within Functional Limits for tasks assessed                Pertinent Vitals/Pain Pain Assessment: No/denies pain           PT Goals (current goals can now be found in the care plan section) Acute Rehab PT Goals Patient Stated Goal: to go home Progress towards PT goals: Progressing toward goals    Frequency    7X/week      PT Plan Current plan remains appropriate       AM-PAC PT "6 Clicks" Mobility   Outcome Measure  Help needed turning from your back to your side while in a flat bed without using bedrails?: None Help needed moving from lying on your back to sitting on the side of a flat bed without using bedrails?: None Help needed moving to and from a bed to a chair (including a wheelchair)?: None Help needed standing up from a chair using your arms (e.g., wheelchair or bedside chair)?: None Help needed to walk in hospital room?: None Help needed climbing 3-5 steps with a railing? : A Little 6 Click Score: 23    End of Session Equipment Utilized During Treatment: Gait belt Activity Tolerance: Patient tolerated treatment well Patient left: in bed;with bed alarm set;with call bell/phone within reach Nurse Communication: Mobility status PT Visit Diagnosis: Unsteadiness on feet (R26.81);Muscle weakness (generalized) (M62.81);Difficulty in walking, not elsewhere classified (R26.2)  Time: 1208-1223 PT Time Calculation (min) (ACUTE ONLY): 15 min  Charges:  $Gait Training: 8-22 mins                     Julaine Fusi PTA 08/11/20, 1:33 PM

## 2020-08-11 NOTE — Care Management Important Message (Signed)
Important Message  Patient Details  Name: Karen [name] MRN: [id number] Date of Birth: 06-14-1946   Medicare Important Message Given:  Yes  Presented to patient via phone due to Airborne Precautions.   [name] E [name], LCSW 08/11/2020, 11:06 AM

## 2020-08-12 DIAGNOSIS — U071 COVID-19: Secondary | ICD-10-CM

## 2020-08-12 LAB — BASIC METABOLIC PANEL
Anion gap: 8 (ref 5–15)
BUN: 22 mg/dL (ref 8–23)
CO2: 27 mmol/L (ref 22–32)
Calcium: 10 mg/dL (ref 8.9–10.3)
Chloride: 100 mmol/L (ref 98–111)
Creatinine, Ser: 1.33 mg/dL — ABNORMAL HIGH (ref 0.44–1.00)
GFR, Estimated: 42 mL/min — ABNORMAL LOW (ref >60)
Glucose, Bld: 89 mg/dL (ref 70–99)
Potassium: 4.4 mmol/L (ref 3.5–5.1)
Sodium: 135 mmol/L (ref 135–145)

## 2020-08-12 LAB — CBC
HCT: 34.8 % — ABNORMAL LOW (ref 36.0–46.0)
Hemoglobin: 11.8 g/dL — ABNORMAL LOW (ref 12.0–15.0)
MCH: 30.8 pg (ref 26.0–34.0)
MCHC: 33.9 g/dL (ref 30.0–36.0)
MCV: 90.9 fL (ref 80.0–100.0)
Platelets: 181 10*3/uL (ref 150–400)
RBC: 3.83 MIL/uL — ABNORMAL LOW (ref 3.87–5.11)
RDW: 13.3 % (ref 11.5–15.5)
WBC: 26.1 10*3/uL — ABNORMAL HIGH (ref 4.0–10.5)
nRBC: 0 % (ref 0.0–0.2)

## 2020-08-12 LAB — MAGNESIUM: Magnesium: 1.8 mg/dL (ref 1.7–2.4)

## 2020-08-12 MED ORDER — CHLORTHALIDONE 25 MG PO TABS: ORAL_TABLET | ORAL | Status: DC

## 2020-08-12 MED ORDER — LISINOPRIL 40 MG PO TABS: ORAL_TABLET | ORAL | Status: DC

## 2020-08-12 MED ORDER — ATORVASTATIN CALCIUM 40 MG PO TABS
40.0000 mg | ORAL_TABLET | Freq: Every day | ORAL | 2 refills | Status: DC
Start: 2020-08-13 — End: 2023-07-16

## 2020-08-12 MED ORDER — ASPIRIN EC 81 MG PO TBEC: 81.0000 mg | DELAYED_RELEASE_TABLET | Freq: Every day | ORAL | Status: AC

## 2020-08-12 NOTE — Discharge Summary (Signed)
Physician Discharge Summary   Karen [name]  female DOB: 1946-03-17  SWH:[id number]  PCP: Perrin Maltese, MD  Admit date: 08/07/2020 Discharge date: 08/12/2020  Admitted From: home Disposition:  Home Daughter updated on the phone about discharge plans prior to discharge.  Home Health: Yes CODE STATUS: Full code  Discharge Instructions    Discharge instructions   Complete by: As directed    You have had some small strokes.  Neurologist recommended taking aspirin, plavix and Lipitor (cholesterol medication) to help reduce future strokes.  You have COVID infection but no breathing problems.  The change in taste may be a COVID symptom which may take some time to improve.  You have mild kidney injury, so please hold chlorthalidone and Lisinopril until you follow up with your outpatient doctor.   Dr. Enzo Bi [facility] Course:  For full details, please see H&P, progress notes, consult notes and ancillary notes.  Briefly,  Karen Jonesis a 75 y.o.femalewith medical history significant ofCLL, CAD, HLD, HTN, hypothyroidism, RLS, OSA. For several days she reported that everything tasted too sweet and she had not been able to eat or drink regular fluids due to associated nausea w/o emesis. She did report that she had been drinking large amounts of water. Due to her symptoms she presented to [facility] for evaulation. Patient received 1 dose of most likely Pfizer last year, never completed the vaccination.  Labs positive for hyponatremia with sodium of 121, creatinine of 1.33, leukocytosis which is chronic at 39 with 87% lymphocytes and smudge cells. Chest x-ray without any acute abnormality.  CT head was done with concern of alteredtaste, And it showed a small low-attenuation inferior to the head of left caudate nucleus and MRI was positive for a small punctate infarct.  Neurology was consulted.  No hyponatremia labs on presentation but she did receive normal saline  with some improvement in sodium.   hyponatremia labs obtained after getting IV fluid and it showed hypoosmolar hyponatremia.  IV fluid discontinued.    Hyponatremia.   Na improved, and was 135 on the day of discharge.  Pt encouraged to drink a variety of fluids, not just water.  Dysgeusia likely due to COVID infection Liberalize diet to regular.  small subcorticalR paramedian stroke in the frontoparietal region. Herneurologic examination with no focal deficit.  MR Angio head without contrast and Carotid duplex with no hemodynamically significant narrowing. TTE with an EF of 65-70% and no atrial level shunt with color flow doppler.  etiology of stroke is small vessel disease.  Pt continued on home plavix and ASA 81.  Lipitor 40 mg daily prescribed.  Recommended smoking cessation.  AKI Cr 1.33 on presentation, and has been ranging between 0.97 to 1.44 during hospitalization with no clear reason for AKI.  Pt's Cr may be very sensitive to fluid intake.  Cr 1.33 on the day of discharge.  COVID-19 infection.   No respiratory symptoms.  Chest x-ray clear.  Inflammatory markers are within normal limits.  Completed 3 days of remdesivir.  CLL.   No acute concern.  Continue outpatient follow-up with oncology.   Discharge Diagnoses:  Active Problems:   CLL (chronic lymphocytic leukemia) (HCC)   Hyponatremia   Dysgeusia    Discharge Instructions:  Allergies as of 08/12/2020      Reactions   Requip [ropinirole Hcl] Anaphylaxis   Throat closing   Celebrex [celecoxib] Itching   Meloxicam Palpitations   Nitrofurantoin Palpitations  Medication List    STOP taking these medications   potassium chloride 10 MEQ tablet Commonly known as: KLOR-CON     TAKE these medications   acetaminophen 500 MG tablet Commonly known as: TYLENOL Take 500-1,000 mg by mouth every 6 (six) hours as needed for moderate pain or fever.   albuterol 108 (90 Base) MCG/ACT inhaler Commonly known as:  VENTOLIN HFA Inhale 1-2 puffs into the lungs every 6 (six) hours as needed for wheezing or shortness of breath.   aspirin EC 81 MG tablet Take 1 tablet (81 mg total) by mouth daily.   atorvastatin 40 MG tablet Commonly known as: LIPITOR Take 1 tablet (40 mg total) by mouth daily. Start taking on: August 13, 2020   budesonide-formoterol 160-4.5 MCG/ACT inhaler Commonly known as: SYMBICORT Inhale 2 puffs into the lungs 2 (two) times daily as needed (shortness of breath).   buPROPion 150 MG 24 hr tablet Commonly known as: WELLBUTRIN XL Take 150 mg by mouth at bedtime.   chlorthalidone 25 MG tablet Commonly known as: HYGROTON Hold until followup with outpatient provider due to acute kidney injury. What changed:   how much to take  how to take this  when to take this  additional instructions   clopidogrel 75 MG tablet Commonly known as: PLAVIX Take 75 mg by mouth daily.   dexlansoprazole 60 MG capsule Commonly known as: DEXILANT Take 60 mg by mouth daily.   fluticasone 50 MCG/ACT nasal spray Commonly known as: FLONASE Place 2 sprays into both nostrils daily as needed for allergies or rhinitis.   gabapentin 400 MG capsule Commonly known as: NEURONTIN Take 400 mg by mouth 3 (three) times daily.   isosorbide mononitrate 60 MG 24 hr tablet Commonly known as: IMDUR Take 60 mg by mouth daily.   lisinopril 40 MG tablet Commonly known as: ZESTRIL Hold until followup with outpatient provider due to acute kidney injury. What changed:   how much to take  how to take this  when to take this  additional instructions   loratadine 10 MG tablet Commonly known as: CLARITIN Take 10 mg by mouth daily.   metoprolol succinate 25 MG 24 hr tablet Commonly known as: TOPROL-XL Take 25 mg by mouth daily.   Repatha 140 MG/ML Sosy Generic drug: Evolocumab Inject 140 mg into the skin every 14 (fourteen) days.   VISINE OP Place 1 drop into both eyes daily as needed (dry  eyes).            Durable Medical Equipment  (From admission, onward)         Start     Ordered   08/10/20 1145  For home use only DME Walker rolling  Once       Question Answer Comment  Walker: With 5 Inch Wheels   Patient needs a walker to treat with the following condition Generalized weakness      08/10/20 1144   08/10/20 1145  For home use only DME 3 n 1  Once        08/10/20 1144           Follow-up Information    Perrin Maltese, MD. Schedule an appointment as soon as possible for a visit in 1 week(s).   Specialty: Internal Medicine Contact information: [place] [address] [phone]               Allergies  Allergen Reactions  . Requip [Ropinirole Hcl] Anaphylaxis    Throat closing  .  Celebrex [Celecoxib] Itching  . Meloxicam Palpitations  . Nitrofurantoin Palpitations     The results of significant diagnostics from this hospitalization (including imaging, microbiology, ancillary and laboratory) are listed below for reference.   Consultations:   Procedures/Studies: DG Chest 2 View  Result Date: 08/07/2020 CLINICAL DATA:  Dyspnea. EXAM: CHEST - 2 VIEW COMPARISON:  Nov 23, 2018. FINDINGS: The heart size and mediastinal contours are within normal limits. Both lungs are clear. No pneumothorax or pleural effusion is noted. The visualized skeletal structures are unremarkable. IMPRESSION: No active cardiopulmonary disease. Electronically Signed   By: Marijo Conception M.D.   On: 08/07/2020 18:26   CT HEAD WO CONTRAST  Result Date: 08/07/2020 CLINICAL DATA:  Weakness and fatigue. EXAM: CT HEAD WITHOUT CONTRAST TECHNIQUE: Contiguous axial images were obtained from the base of the skull through the vertex without intravenous contrast. COMPARISON:  November 08, 2009 FINDINGS: Brain: There is mild cerebral atrophy with widening of the extra-axial spaces and ventricular dilatation. There are areas of decreased attenuation within the white matter  tracts of the supratentorial brain, consistent with microvascular disease changes. A 1.3 cm x 0.5 cm area of white matter low attenuation is seen inferior to the head of the left caudate nucleus. This represents a new finding when compared to the prior study. There is no evidence of associated mass effect or midline shift. No acute hemorrhage is identified. Vascular: No hyperdense vessel or unexpected calcification. Skull: Normal. Negative for fracture or focal lesion. Sinuses/Orbits: No acute finding. Other: None. IMPRESSION: 1. Small area of white matter low attenuation inferior to the head of the left caudate nucleus which may represent a small acute to subacute infarct. MRI correlation is recommended. Electronically Signed   By: Virgina Norfolk M.D.   On: 08/07/2020 21:49   MR ANGIO HEAD WO CONTRAST  Result Date: 08/10/2020 CLINICAL DATA:  Acute stroke on recent MRI. EXAM: MRA HEAD WITHOUT CONTRAST TECHNIQUE: Angiographic images of the Circle of Willis were obtained using MRA technique without intravenous contrast. COMPARISON:  MRI head 08/08/2020 FINDINGS: Anterior circulation: Bilateral internal carotid arteries are patent through the carotid siphons. Bilateral M1 and proximal M2 vessels are patent. Bilateral ACA vessels are patent. No visible hemodynamically significant proximal arterial stenosis. Multifocal mild atherosclerotic narrowing throughout the anterior circulation. No aneurysm identified. Posterior circulation: Left dominant vertebral artery. Bilateral vertebral arteries are patent. Suspected moderate to severe narrowing of the right vertebral artery as it becomes intradural, although this area is not well evaluated on this study. By basilar artery and bilateral PCAs are patent. Multifocal mild atherosclerotic narrowing throughout the posterior circulation. IMPRESSION: 1. No emergent large vessel occlusion. 2. Suspected moderate to severe narrowing of the proximal intradural non dominant right  vertebral artery, although this area is not well evaluated on this study. Otherwise, there is multifocal mild atherosclerotic narrowing without evidence of proximal hemodynamically significant stenosis. Electronically Signed   By: Margaretha Sheffield MD   On: 08/10/2020 14:14   MR BRAIN WO CONTRAST  Result Date: 08/08/2020 CLINICAL DATA:  Neuro deficit, acute stroke suspected. EXAM: MRI HEAD WITHOUT CONTRAST TECHNIQUE: Multiplanar, multiecho pulse sequences of the brain and surrounding structures were obtained without intravenous contrast. COMPARISON:  Same day head CT. FINDINGS: Brain: Motion limited study. Punctate acute infarct in the high paramidline right frontoparietal cortex (see series 9, images 33 and 34). Minimal associated edema without mass effect. Additional confluent T2/FLAIR hyperintensities within the white matter, compatible with advanced chronic microvascular ischemic disease. Remote lacunar infarcts in  the left basal ganglia and inferior right cerebellum. No acute hemorrhage. Punctate areas of susceptibility artifact within bilateral thalami, right basal ganglia and left frontal white matter, compatible with prior microhemorrhages. No hydrocephalus. No midline shift. No extra-axial fluid collection. No mass lesion. Mild to moderate generalized cerebral atrophy with ex vacuo ventricular dilation. Vascular: Major arterial flow voids are maintained at the skull base. Skull and upper cervical spine: Normal marrow signal. Sinuses/Orbits: Mild ethmoid air cell mucosal thickening without air-fluid levels. Unremarkable orbits. Other: Small right mastoid effusion. IMPRESSION: 1. Punctate acute infarct in the high paramidline right frontoparietal cortex. Minimal associated edema without mass effect. 2. Remote lacunar infarcts in the left basal ganglia and right cerebellum. 3. Advanced chronic microvascular ischemic disease. Electronically Signed   By: Margaretha Sheffield MD   On: 08/08/2020 16:59   US  Carotid Bilateral  Result Date: 08/10/2020 CLINICAL DATA:  75 year old female with a history of stroke EXAM: BILATERAL CAROTID DUPLEX ULTRASOUND TECHNIQUE: Pearline Cables scale imaging, color Doppler and duplex ultrasound were performed of bilateral carotid and vertebral arteries in the neck. COMPARISON:  None. FINDINGS: Criteria: Quantification of carotid stenosis is based on velocity parameters that correlate the residual internal carotid diameter with NASCET-based stenosis levels, using the diameter of the distal internal carotid lumen as the denominator for stenosis measurement. The following velocity measurements were obtained: RIGHT ICA:  Systolic 97 cm/sec, Diastolic 33 cm/sec CCA:  82 cm/sec SYSTOLIC ICA/CCA RATIO:  1.0 ECA:  158 cm/sec LEFT ICA:  Systolic 96 cm/sec, Diastolic 21 cm/sec CCA:  89 cm/sec SYSTOLIC ICA/CCA RATIO:  1.1 ECA:  179 cm/sec Right Brachial SBP: Not acquired Left Brachial SBP: Not acquired RIGHT CAROTID ARTERY: No significant calcifications of the right common carotid artery. Intermediate waveform maintained. Heterogeneous and partially calcified plaque at the right carotid bifurcation. No significant lumen shadowing. Low resistance waveform of the right ICA. No significant tortuosity. RIGHT VERTEBRAL ARTERY: Antegrade flow with low resistance waveform. LEFT CAROTID ARTERY: No significant calcifications of the left common carotid artery. Intermediate waveform maintained. Heterogeneous and partially calcified plaque at the left carotid bifurcation without significant lumen shadowing. Low resistance waveform of the left ICA. No significant tortuosity. LEFT VERTEBRAL ARTERY:  Antegrade flow with low resistance waveform. Lymph nodes in the lateral left neck with architecture maintained, borderline enlarged. IMPRESSION: Color duplex indicates minimal heterogeneous and calcified plaque, with no hemodynamically significant stenosis by duplex criteria in the extracranial cerebrovascular circulation.  Borderline enlarged lymph nodes of the left neck, with typical architecture maintained, nonspecific though potentially reactive. Signed, Dulcy Fanny. Dellia Nims, RPVI [facility] Electronically Signed   By: Corrie Mckusick D.O.   On: 08/10/2020 12:55   ECHOCARDIOGRAM COMPLETE  Result Date: 08/10/2020    ECHOCARDIOGRAM REPORT   Patient Name:   Karen [name] Date of Exam: 08/10/2020 Medical Rec #:  [id number]     Height:       62.0 in Accession #:    [id number]    Weight:       152.0 lb Date of Birth:  1945/10/07     BSA:          1.701 m Patient Age:    57 years      BP:           120/67 mmHg Patient Gender: F             HR:           52 bpm. Exam Location:  [facility] Procedure:  2D Echo, Cardiac Doppler and Color Doppler Indications:     Stroke 434.91/ I163.9  History:         Patient has no prior history of Echocardiogram examinations.                  Previous Myocardial Infarction; Risk Factors:Hypertension and                  Sleep Apnea.  Sonographer:     [name] RDCS (AE) Referring Phys:  [id number] [facility] AMIN Diagnosing Phys: Nelva Bush MD IMPRESSIONS  1. Left ventricular ejection fraction, by estimation, is 65 to 70%. The left ventricle has normal function. The left ventricle has no regional wall motion abnormalities. There is mild left ventricular hypertrophy. Left ventricular diastolic parameters are consistent with Grade I diastolic dysfunction (impaired relaxation). Elevated left atrial pressure.  2. Right ventricular systolic function is normal. The right ventricular size is normal.  3. The mitral valve is degenerative. Mild mitral valve regurgitation. No evidence of mitral stenosis.  4. The aortic valve has an indeterminant number of cusps. There is mild calcification of the aortic valve. There is moderate thickening of the aortic valve. Aortic valve regurgitation is not visualized. Mild to moderate aortic valve sclerosis/calcification is  present, without any evidence of aortic stenosis.  5. The inferior vena cava is normal in size with greater than 50% respiratory variability, suggesting right atrial pressure of 3 mmHg. FINDINGS  Left Ventricle: Left ventricular ejection fraction, by estimation, is 65 to 70%. The left ventricle has normal function. The left ventricle has no regional wall motion abnormalities. The left ventricular internal cavity size was normal in size. There is  mild left ventricular hypertrophy. Left ventricular diastolic parameters are consistent with Grade I diastolic dysfunction (impaired relaxation). Elevated left atrial pressure. The E/e' is 16.9. Right Ventricle: The right ventricular size is normal. No increase in right ventricular wall thickness. Right ventricular systolic function is normal. Left Atrium: Left atrial size was normal in size. Right Atrium: Right atrial size was normal in size. Pericardium: There is no evidence of pericardial effusion. Mitral Valve: The mitral valve is degenerative in appearance. Mild to moderate mitral annular calcification. Mild mitral valve regurgitation. No evidence of mitral valve stenosis. Tricuspid Valve: The tricuspid valve is normal in structure. Tricuspid valve regurgitation is trivial. Aortic Valve: The aortic valve has an indeterminant number of cusps. There is mild calcification of the aortic valve. There is moderate thickening of the aortic valve. Aortic valve regurgitation is not visualized. Mild to moderate aortic valve sclerosis/calcification is present, without any evidence of aortic stenosis. Aortic valve mean gradient measures 3.0 mmHg. Aortic valve peak gradient measures 7.4 mmHg. Aortic valve area, by VTI measures 2.90 cm. Pulmonic Valve: The pulmonic valve was not well visualized. Pulmonic valve regurgitation is mild. No evidence of pulmonic stenosis. Aorta: The aortic root is normal in size and structure. Pulmonary Artery: The pulmonary artery is not well seen.  Venous: The inferior vena cava is normal in size with greater than 50% respiratory variability, suggesting right atrial pressure of 3 mmHg. IAS/Shunts: No atrial level shunt detected by color flow Doppler.  LEFT VENTRICLE PLAX 2D LVIDd:         3.60 cm  Diastology LVIDs:         2.50 cm  LV e' medial:    5.87 cm/s LV PW:         1.10 cm  LV E/e' medial:  16.9 LV IVS:  1.00 cm  LV e' lateral:   5.00 cm/s LVOT diam:     2.00 cm  LV E/e' lateral: 19.9 LV SV:         79 LV SV Index:   46 LVOT Area:     3.14 cm  RIGHT VENTRICLE RV S prime:     11.30 cm/s TAPSE (M-mode): 3.2 cm LEFT ATRIUM           Index       RIGHT ATRIUM           Index LA diam:      3.20 cm 1.88 cm/m  RA Area:     12.30 cm LA Vol (A4C): 44.2 ml 25.98 ml/m RA Volume:   22.90 ml  13.46 ml/m  AORTIC VALVE                   PULMONIC VALVE AV Area (Vmax):    2.45 cm    PV Vmax:        0.74 m/s AV Area (Vmean):   3.01 cm    PV Peak grad:   2.2 mmHg AV Area (VTI):     2.90 cm    RVOT Peak grad: 4 mmHg AV Vmax:           136.00 cm/s AV Vmean:          79.900 cm/s AV VTI:            0.272 m AV Peak Grad:      7.4 mmHg AV Mean Grad:      3.0 mmHg LVOT Vmax:         106.00 cm/s LVOT Vmean:        76.600 cm/s LVOT VTI:          0.251 m LVOT/AV VTI ratio: 0.92  AORTA Ao Root diam: 2.93 cm MITRAL VALVE MV Area (PHT): 2.71 cm     SHUNTS MV Decel Time: 280 msec     Systemic VTI:  0.25 m MV E velocity: 99.40 cm/s   Systemic Diam: 2.00 cm MV A velocity: 132.00 cm/s MV E/A ratio:  0.75 Harrell Gave End MD Electronically signed by Nelva Bush MD Signature Date/Time: 08/10/2020/5:58:06 PM    Final       Labs: BNP (last 3 results) No results for input(s): BNP in the last 8760 hours. Basic Metabolic Panel: Recent Labs  Lab 08/09/20 0336 08/09/20 0800 08/10/20 0523 08/11/20 0559 08/12/20 0607  NA 126* 128* 130* 130* 135  K 3.4* 3.6 4.0 4.0 4.4  CL 96* 96* 98 98 100  CO2 22 25 22 23 27   GLUCOSE 82 85 81 77 89  BUN 18 16 18 22 22    CREATININE 1.02* 0.98 1.41* 1.44* 1.33*  CALCIUM 9.2 9.4 9.7 9.7 10.0  MG  --   --   --   --  1.8   Liver Function Tests: No results for input(s): AST, ALT, ALKPHOS, BILITOT, PROT, ALBUMIN in the last 168 hours. No results for input(s): LIPASE, AMYLASE in the last 168 hours. No results for input(s): AMMONIA in the last 168 hours. CBC: Recent Labs  Lab 08/07/20 1547 08/11/20 0559 08/12/20 0607  WBC 39.0*  39.2* 29.3* 26.1*  NEUTROABS 3.0  --   --   HGB 12.3  12.3 10.8* 11.8*  HCT 35.4*  35.7* 32.3* 34.8*  MCV 88.7  89.0 90.5 90.9  PLT 215  208 168 181   Cardiac Enzymes: No results for input(s): CKTOTAL, CKMB,  CKMBINDEX, TROPONINI in the last 168 hours. BNP: Invalid input(s): POCBNP CBG: No results for input(s): GLUCAP in the last 168 hours. D-Dimer No results for input(s): DDIMER in the last 72 hours. Hgb A1c No results for input(s): HGBA1C in the last 72 hours. Lipid Profile Recent Labs    08/10/20 0523  CHOL 176  HDL 21*  LDLCALC 138*  TRIG 87  CHOLHDL 8.4   Thyroid function studies No results for input(s): TSH, T4TOTAL, T3FREE, THYROIDAB in the last 72 hours.  Invalid input(s): FREET3 Anemia work up No results for input(s): VITAMINB12, FOLATE, FERRITIN, TIBC, IRON, RETICCTPCT in the last 72 hours. Urinalysis    Component Value Date/Time   COLORURINE YELLOW (A) 08/07/2020 2159   APPEARANCEUR CLEAR (A) 08/07/2020 2159   LABSPEC 1.009 08/07/2020 2159   PHURINE 6.0 08/07/2020 2159   GLUCOSEU NEGATIVE 08/07/2020 2159   HGBUR NEGATIVE 08/07/2020 2159   BILIRUBINUR NEGATIVE 08/07/2020 2159   BILIRUBINUR neg 12/09/2015 1559   KETONESUR 5 (A) 08/07/2020 2159   PROTEINUR NEGATIVE 08/07/2020 2159   UROBILINOGEN 0.2 12/09/2015 1559   NITRITE NEGATIVE 08/07/2020 2159   LEUKOCYTESUR NEGATIVE 08/07/2020 2159   Sepsis Labs Invalid input(s): PROCALCITONIN,  WBC,  LACTICIDVEN Microbiology Recent Results (from the past 240 hour(s))  SARS CORONAVIRUS 2 (TAT  6-24 HRS) Nasopharyngeal Nasopharyngeal Swab     Status: Abnormal   Collection Time: 08/07/20  6:00 PM   Specimen: Nasopharyngeal Swab  Result Value Ref Range Status   SARS Coronavirus 2 POSITIVE (A) NEGATIVE Final    Comment: (NOTE) SARS-CoV-2 target nucleic acids are DETECTED.  The SARS-CoV-2 RNA is generally detectable in upper and lower respiratory specimens during the acute phase of infection. Positive results are indicative of the presence of SARS-CoV-2 RNA. Clinical correlation with patient history and other diagnostic information is  necessary to determine patient infection status. Positive results do not rule out bacterial infection or co-infection with other viruses.  The expected result is Negative.  Fact Sheet for Patients: SugarRoll.be  Fact Sheet for Healthcare Providers: https://www.woods-mathews.com/  This test is not yet approved or cleared by the [place] FDA and  has been authorized for detection and/or diagnosis of SARS-CoV-2 by FDA under an Emergency Use Authorization (EUA). This EUA will remain  in effect (meaning this test can be used) for the duration of the COVID-19 declaration under Section 564(b)(1) of the Act, 21 U. S.C. section 360bbb-3(b)(1), unless the authorization is terminated or revoked sooner.   Performed at [facility], [place] [address], [place], [place] [address]      Total time spend on discharging this patient, including the last patient exam, discussing the hospital stay, instructions for ongoing care as it relates to all pertinent caregivers, as well as preparing the medical discharge records, prescriptions, and/or referrals as applicable, is 40 minutes.     Enzo Bi, MD  [facility] 08/12/2020, 12:57 PM

## 2020-08-12 NOTE — TOC Transition Note (Signed)
Transition of Care [facility]) - CM/SW Discharge Note   Patient Details  Name: Niemah Schwebke MRN: [id number] Date of Birth: Oct 06, 1945  Transition of Care [facility]) CM/SW Contact:  Magnus Ivan, LCSW Phone Number: 08/12/2020, 1:15 PM   Clinical Narrative:   Patient to discharge home today. RW and 3 in 1 ordered already. [facility] [place] to follow for PT and OT services. Informed Representative [place] today. Attempted call to daughter Adela Lank regarding DC, left a VM for Shelia. Attempted call into patient's room due to Airborne Precautions, no answer. Asked RN to have patient call her daughter.     Final next level of care: [place] Barriers to Discharge: Barriers Resolved   Patient Goals and CMS Choice Patient states their goals for this hospitalization and ongoing recovery are:: home with home health CMS Medicare.gov Compare Post Acute Care list provided to:: Patient Choice offered to / list presented to : Patient  Discharge Placement                  Name of family member notified: left VM for daughter Nyra Market Patient and family notified of of transfer: 08/12/20  Discharge Plan and Services                DME Arranged: 3-N-1,Walker rolling DME Agency: [employer] Date DME Agency Contacted: 08/10/20   Representative spoke with at DME Agency: Nash Shearer [place]: PT,OT [facility] Agency: [facility] [place] Date [facility] Agency Contacted: 08/12/20   Representative spoke with at [place]: Jana Half  Social Determinants of Health ([place]) Interventions     Readmission Risk Interventions No flowsheet data found.

## 2020-08-12 NOTE — Progress Notes (Signed)
Physical Therapy Treatment Patient Details Name: Karen [name] MRN: [id number] DOB: 1946/02/20 Today's Date: 08/12/2020    History of Present Illness Pt admitted for weakness/fatigue and loss of taste and smell. Pt now positive with Covid. History includes CLL, CAD, HLD, and HTN. Of note, now diagnosed with acute CVA.    PT Comments    Pt was side lying in be upon arriving. She agrees to PT session and is cooperative and pleasant throughout. She requested to go see her boyfriend who is also in hospital. Cleared by RN staff to do so. Required supervision throughout session. Did have more noticeable tremors today with exertion but overall tolerated session well. Recommend dc home with continued skilled PT to address deficit while improving safety with ADL.    Follow Up Recommendations  Home health PT;Supervision for mobility/OOB     Equipment Recommendations  Rolling walker with 5" wheels    Recommendations for Other Services       Precautions / Restrictions Precautions Precautions: Fall Restrictions Weight Bearing Restrictions: No    Mobility  Bed Mobility Overal bed mobility: Needs Assistance Bed Mobility: Supine to Sit;Sit to Supine     Supine to sit: Supervision Sit to supine: Supervision      Transfers Overall transfer level: Needs assistance Equipment used: Rolling walker (2 wheeled) Transfers: Sit to/from Stand Sit to Stand: Supervision            Ambulation/Gait Ambulation/Gait assistance: Supervision Gait Distance (Feet): 300 Feet Assistive device: Rolling walker (2 wheeled) Gait Pattern/deviations: WFL(Within Functional Limits) Gait velocity: WNL   General Gait Details: Easily able to ambulate       Balance Overall balance assessment: Needs assistance Sitting-balance support: Feet supported Sitting balance-Leahy Scale: Good Sitting balance - Comments: no LOB in sitting   Standing balance support: Bilateral upper extremity supported;During  functional activity Standing balance-Leahy Scale: Good      Cognition Arousal/Alertness: Awake/alert Behavior During Therapy: WFL for tasks assessed/performed Overall Cognitive Status: Within Functional Limits for tasks assessed    General Comments: Pt is alert and able to consistently follow commands             Pertinent Vitals/Pain Pain Assessment: No/denies pain           PT Goals (current goals can now be found in the care plan section) Acute Rehab PT Goals Patient Stated Goal: to go home Progress towards PT goals: Progressing toward goals    Frequency    7X/week      PT Plan Current plan remains appropriate       AM-PAC PT "6 Clicks" Mobility   Outcome Measure  Help needed turning from your back to your side while in a flat bed without using bedrails?: None Help needed moving from lying on your back to sitting on the side of a flat bed without using bedrails?: None Help needed moving to and from a bed to a chair (including a wheelchair)?: None Help needed standing up from a chair using your arms (e.g., wheelchair or bedside chair)?: None Help needed to walk in hospital room?: None Help needed climbing 3-5 steps with a railing? : A Little 6 Click Score: 23    End of Session Equipment Utilized During Treatment: Gait belt Activity Tolerance: Patient tolerated treatment well Patient left: in bed;with bed alarm set;with call bell/phone within reach Nurse Communication: Mobility status PT Visit Diagnosis: Unsteadiness on feet (R26.81);Muscle weakness (generalized) (M62.81);Difficulty in walking, not elsewhere classified (R26.2)     Time: 9798-9211 PT  Time Calculation (min) (ACUTE ONLY): 20 min  Charges:  $Gait Training: 8-22 mins                     Julaine Fusi PTA 08/12/20, 12:37 PM

## 2020-08-27 ENCOUNTER — Encounter: Payer: Self-pay | Admitting: Internal Medicine

## 2020-08-27 ENCOUNTER — Telehealth: Payer: Self-pay

## 2020-08-27 ENCOUNTER — Telehealth: Payer: Self-pay | Admitting: Oncology

## 2020-08-27 NOTE — Telephone Encounter (Signed)
Called pt to r/s appt 10/22/20 to a sooner date due to recent hospitalization (per MD). Patient confirmed that she can come in on 09/14/20. Sending AVS via mail.

## 2020-08-27 NOTE — Telephone Encounter (Signed)
-----   Message from Earlie Server, MD sent at 08/27/2020 12:35 PM EST ----- Please move her appt up to 2 weeks from now. She was recently admitted.  Same labs

## 2020-08-27 NOTE — Telephone Encounter (Signed)
Please r/s as MD recommends and notify patient of appt details.

## 2020-09-14 ENCOUNTER — Encounter: Payer: Self-pay | Admitting: Oncology

## 2020-09-14 ENCOUNTER — Other Ambulatory Visit: Payer: Self-pay

## 2020-09-14 ENCOUNTER — Inpatient Hospital Stay (HOSPITAL_BASED_OUTPATIENT_CLINIC_OR_DEPARTMENT_OTHER): Payer: Medicare Other | Admitting: Oncology

## 2020-09-14 ENCOUNTER — Inpatient Hospital Stay: Payer: Medicare Other | Attending: Oncology

## 2020-09-14 VITALS — BP 157/84 | HR 61 | Temp 97.5°F | Resp 18 | Wt 148.1 lb

## 2020-09-14 DIAGNOSIS — Z720 Tobacco use: Secondary | ICD-10-CM

## 2020-09-14 DIAGNOSIS — C9110 Chronic lymphocytic leukemia of B-cell type not having achieved remission: Secondary | ICD-10-CM | POA: Diagnosis present

## 2020-09-14 DIAGNOSIS — R918 Other nonspecific abnormal finding of lung field: Secondary | ICD-10-CM | POA: Diagnosis not present

## 2020-09-14 DIAGNOSIS — F17210 Nicotine dependence, cigarettes, uncomplicated: Secondary | ICD-10-CM | POA: Diagnosis not present

## 2020-09-14 DIAGNOSIS — Z79899 Other long term (current) drug therapy: Secondary | ICD-10-CM | POA: Insufficient documentation

## 2020-09-14 DIAGNOSIS — I10 Essential (primary) hypertension: Secondary | ICD-10-CM | POA: Diagnosis not present

## 2020-09-14 DIAGNOSIS — I252 Old myocardial infarction: Secondary | ICD-10-CM | POA: Diagnosis not present

## 2020-09-14 DIAGNOSIS — E039 Hypothyroidism, unspecified: Secondary | ICD-10-CM | POA: Diagnosis not present

## 2020-09-14 LAB — COMPREHENSIVE METABOLIC PANEL
ALT: 12 U/L (ref 0–44)
AST: 13 U/L — ABNORMAL LOW (ref 15–41)
Albumin: 4.1 g/dL (ref 3.5–5.0)
Alkaline Phosphatase: 61 U/L (ref 38–126)
Anion gap: 9 (ref 5–15)
BUN: 11 mg/dL (ref 8–23)
CO2: 28 mmol/L (ref 22–32)
Calcium: 9.4 mg/dL (ref 8.9–10.3)
Chloride: 103 mmol/L (ref 98–111)
Creatinine, Ser: 1.2 mg/dL — ABNORMAL HIGH (ref 0.44–1.00)
GFR, Estimated: 47 mL/min — ABNORMAL LOW (ref >60)
Glucose, Bld: 96 mg/dL (ref 70–99)
Potassium: 3.9 mmol/L (ref 3.5–5.1)
Sodium: 140 mmol/L (ref 135–145)
Total Bilirubin: 0.5 mg/dL (ref 0.3–1.2)
Total Protein: 7 g/dL (ref 6.5–8.1)

## 2020-09-14 LAB — CBC WITH DIFFERENTIAL/PLATELET
Abs Immature Granulocytes: 0.05 10*3/uL (ref 0.00–0.07)
Basophils Absolute: 0.1 10*3/uL (ref 0.0–0.1)
Basophils Relative: 0 %
Eosinophils Absolute: 0.3 10*3/uL (ref 0.0–0.5)
Eosinophils Relative: 1 %
HCT: 37.2 % (ref 36.0–46.0)
Hemoglobin: 11.7 g/dL — ABNORMAL LOW (ref 12.0–15.0)
Immature Granulocytes: 0 %
Lymphocytes Relative: 82 %
Lymphs Abs: 25.6 10*3/uL — ABNORMAL HIGH (ref 0.7–4.0)
MCH: 31.2 pg (ref 26.0–34.0)
MCHC: 31.5 g/dL (ref 30.0–36.0)
MCV: 99.2 fL (ref 80.0–100.0)
Monocytes Absolute: 1.8 10*3/uL — ABNORMAL HIGH (ref 0.1–1.0)
Monocytes Relative: 6 %
Neutro Abs: 3.6 10*3/uL (ref 1.7–7.7)
Neutrophils Relative %: 11 %
Platelets: 177 10*3/uL (ref 150–400)
RBC: 3.75 MIL/uL — ABNORMAL LOW (ref 3.87–5.11)
RDW: 15 % (ref 11.5–15.5)
Smear Review: NORMAL
WBC: 31.3 10*3/uL — ABNORMAL HIGH (ref 4.0–10.5)
nRBC: 0 % (ref 0.0–0.2)

## 2020-09-14 LAB — LACTATE DEHYDROGENASE: LDH: 124 U/L (ref 98–192)

## 2020-09-14 NOTE — Progress Notes (Signed)
Patient denies new problems/concerns today.   °

## 2020-09-14 NOTE — Progress Notes (Signed)
Hematology/Oncology Follow up  note [facility] [phone] [phone]   Patient Care Team: Perrin Maltese, MD as PCP - General (Internal Medicine) Earlie Server, MD as Consulting Physician (Hematology and Oncology)  REFERRING PROVIDER:  Perrin Maltese, MD  REASON FOR VISIT Follow up for treatment of CLL  HISTORY OF PRESENTING ILLNESS:  Karen [name] is a  75 y.o.  female with PMH listed below who was referred to me for evaluation of persistent leukocytosis/lymphocytosis. Reviewed patient's lab work done at PCPs office. 05/01/2017 WBC 11.7 absolute lymphocytes 7.4. Hemoglobin 13 MCV 95 platelet counts 225,000. 06/22/2017 WBC is 17.5, absolute lymphocyte 11.7 hemoglobin 11.9, MCV 92, platelet count 240,000. Patient reports feeling tired at baseline. Otherwise denies any night sweats, fever or chills. She reports she got sick around holiday. But today she feels better. She is active smoker, currently 1 or 2 cigarettes a day  # 10/14/2015 chest without contrast Images were independently reviewed by me Small pulmonary nodules are stable over the past year.  Considered benign.  Several lymph nodes are borderline enlarged, including the lower right paratracheal lymph node and a portacaval lymph node. 08/27/2017 US abdomen complete, no hepato-splenomegaly.   INTERVAL HISTORY Karen [name] is a 75 y.o. female who has above history reviewed by me today present for follow-up visit for CLL 08/07/2020, patient was diagnosed with COVID-19 was hospitalized. Patient reports that she has lost weight during her admission.  After being discharged, she has gained some of the weight back.  Today she weighed 148 pounds, close to her baseline of 150-152 pounds in late 2021. Continues to smoke daily. Denies any fatigue, unintentional weight loss, night sweats, fever or chills.  Review of Systems  Constitutional: Positive for fatigue. Negative for appetite change,  chills, fever and unexpected weight change.  HENT:   Negative for hearing loss and voice change.   Eyes: Negative for eye problems.  Respiratory: Negative for chest tightness and cough.   Cardiovascular: Negative for chest pain.  Gastrointestinal: Negative for abdominal distention, abdominal pain and blood in stool.  Endocrine: Negative for hot flashes.  Genitourinary: Negative for difficulty urinating and frequency.   Musculoskeletal: Negative for arthralgias.  Skin: Negative for itching and rash.  Neurological: Negative for extremity weakness.  Hematological: Negative for adenopathy.  Psychiatric/Behavioral: Negative for confusion.     MEDICAL HISTORY:  Past Medical History:  Diagnosis Date  . Anxiety   . Artery occlusion   . Asthma   . Back pain   . Bronchitis   . Cancer ([place])    Skin Cancer  . CLL (chronic lymphocytic leukemia) ([place])   . Coronary artery disease   . Depression   . Dyspnea   . GERD (gastroesophageal reflux disease)   . History of kidney stones   . Hypercholesteremia   . Hyperlipemia   . Hypertension   . Hypothyroidism   . Myocardial infarction ([place]) 1999  . Neuropathy   . Restless leg syndrome   . Sleep apnea    use C-PAP  . Wears dentures    full upper, partial lower.  doesn't currently wear    SURGICAL HISTORY: Past Surgical History:  Procedure Laterality Date  . COLONOSCOPY WITH PROPOFOL N/A 11/20/2016   Procedure: COLONOSCOPY WITH PROPOFOL;  Surgeon: Lucilla Lame, MD;  Location: [place];  Service: Endoscopy;  Laterality: N/A;  sleep apnea  . CORONARY ANGIOPLASTY WITH STENT PLACEMENT  2008  . DILATION AND CURETTAGE OF UTERUS    . EYE  SURGERY Bilateral    Cataract Extraction with IOL  . HAND SURGERY Left 12/2017   upper hand fracture repair  . LEFT HEART CATH AND CORONARY ANGIOGRAPHY N/A 06/29/2017   Procedure: LEFT HEART CATH AND CORONARY ANGIOGRAPHY;  Surgeon: Dionisio David, MD;  Location: [place] [facility];  Service:  Cardiovascular;  Laterality: N/A;  . LEFT HEART CATH AND CORONARY ANGIOGRAPHY Left 12/02/2018   Procedure: LEFT HEART CATH AND CORONARY ANGIOGRAPHY;  Surgeon: Dionisio David, MD;  Location: [place] [facility];  Service: Cardiovascular;  Laterality: Left;  . OPEN REDUCTION INTERNAL FIXATION (ORIF) DISTAL RADIAL FRACTURE Right 08/12/2018   Procedure: OPEN REDUCTION INTERNAL FIXATION (ORIF) DISTAL RADIAL FRACTURE;  Surgeon: [name], MD;  Location: [facility];  Service: Orthopedics;  Laterality: Right;  . POLYPECTOMY  11/20/2016   Procedure: POLYPECTOMY;  Surgeon: Lucilla Lame, MD;  Location: [place];  Service: Endoscopy;;  . TUBAL LIGATION    . VAGINAL HYSTERECTOMY    . VULVECTOMY Right 01/31/2016   Procedure: WIDE EXCISION VULVECTOMY-RIGHT LABIA;  Surgeon: Brayton Mars, MD;  Location: [facility];  Service: Gynecology;  Laterality: Right;    SOCIAL HISTORY: Social History   Socioeconomic History  . Marital status: Single    Spouse name: Not on file  . Number of children: 5  . Years of education: Not on file  . Highest education level: Not on file  Occupational History  . Occupation: part time job    Comment: Engineer, building services  Tobacco Use  . Smoking status: Current Every Day Smoker    Packs/day: 1.00    Years: 35.00    Pack years: 35.00    Types: Cigarettes  . Smokeless tobacco: Current User    Types: Snuff  Vaping Use  . Vaping Use: Never used  Substance and Sexual Activity  . Alcohol use: No    Alcohol/week: 0.0 standard drinks  . Drug use: No  . Sexual activity: Not Currently    Partners: Male    Birth control/protection: Surgical  Other Topics Concern  . Not on file  Social History Narrative  . Not on file   Social Determinants of Health   Financial Resource Strain: Not on file  Food Insecurity: Not on file  Transportation Needs: Not on file  Physical Activity: Not on file  Stress: Not on file  Social Connections: Not on file  Intimate Partner  Violence: Not on file    FAMILY HISTORY: Family History  Problem Relation Age of Onset  . Heart disease Father   . Stroke Mother   . Hypertension Mother   . Lung cancer Brother   . Heart attack Brother   . Congestive Heart Failure Sister   . Cervical cancer Sister   . Cancer Neg Hx   . Diabetes Neg Hx   . Breast cancer Neg Hx     ALLERGIES:  is allergic to requip [ropinirole hcl], celebrex [celecoxib], meloxicam, and nitrofurantoin.  MEDICATIONS:  Current Outpatient Medications  Medication Sig Dispense Refill  . acetaminophen (TYLENOL) 500 MG tablet Take 500-1,000 mg by mouth every 6 (six) hours as needed for moderate pain or fever.    Marland Kitchen albuterol (VENTOLIN HFA) 108 (90 Base) MCG/ACT inhaler Inhale 1-2 puffs into the lungs every 6 (six) hours as needed for wheezing or shortness of breath.    Marland Kitchen aspirin EC 81 MG tablet Take 1 tablet (81 mg total) by mouth daily. 30 tablet   . atorvastatin (LIPITOR) 40 MG tablet Take 1  tablet (40 mg total) by mouth daily. 30 tablet 2  . budesonide-formoterol (SYMBICORT) 160-4.5 MCG/ACT inhaler Inhale 2 puffs into the lungs 2 (two) times daily as needed (shortness of breath).    Marland Kitchen buPROPion (WELLBUTRIN XL) 150 MG 24 hr tablet Take 150 mg by mouth at bedtime.    . clopidogrel (PLAVIX) 75 MG tablet Take 75 mg by mouth daily.     Marland Kitchen dexlansoprazole (DEXILANT) 60 MG capsule Take 60 mg by mouth daily.     . fluticasone (FLONASE) 50 MCG/ACT nasal spray Place 2 sprays into both nostrils daily as needed for allergies or rhinitis.    Marland Kitchen gabapentin (NEURONTIN) 400 MG capsule Take 400 mg by mouth 3 (three) times daily.    . isosorbide mononitrate (IMDUR) 60 MG 24 hr tablet Take 60 mg by mouth daily.    Marland Kitchen lisinopril (ZESTRIL) 40 MG tablet Hold until followup with outpatient provider due to acute kidney injury.    Marland Kitchen loratadine (CLARITIN) 10 MG tablet Take 10 mg by mouth daily.    . meclizine (ANTIVERT) 25 MG tablet Take 25 mg by mouth 2 (two) times daily as needed.     . metoprolol succinate (TOPROL-XL) 25 MG 24 hr tablet Take 25 mg by mouth daily.     . ondansetron (ZOFRAN-ODT) 4 MG disintegrating tablet Take 4 mg by mouth every 8 (eight) hours as needed.    . Tetrahydrozoline HCl (VISINE OP) Place 1 drop into both eyes daily as needed (dry eyes).    . chlorthalidone (HYGROTON) 25 MG tablet Hold until followup with outpatient provider due to acute kidney injury. (Patient not taking: Reported on 09/14/2020)    . REPATHA 140 MG/ML SOSY Inject 140 mg into the skin every 14 (fourteen) days. (Patient not taking: Reported on 09/14/2020)     No current facility-administered medications for this visit.     PHYSICAL EXAMINATION: ECOG PERFORMANCE STATUS: 1 - Symptomatic but completely ambulatory Vitals:   09/14/20 1024  BP: (!) 157/84  Pulse: 61  Resp: 18  Temp: (!) 97.5 F (36.4 C)   Filed Weights   09/14/20 1024  Weight: 148 lb 1.6 oz (67.2 kg)    Physical Exam Constitutional:      General: She is not in acute distress.    Appearance: She is not diaphoretic.  HENT:     Head: Normocephalic and atraumatic.     Nose: Nose normal.     Mouth/Throat:     Pharynx: No oropharyngeal exudate.  Eyes:     General: No scleral icterus.       Left eye: No discharge.     Conjunctiva/sclera: Conjunctivae normal.     Pupils: Pupils are equal, round, and reactive to light.  Neck:     Vascular: No JVD.  Cardiovascular:     Rate and Rhythm: Normal rate and regular rhythm.     Heart sounds: No murmur heard.   Pulmonary:     Effort: Pulmonary effort is normal. No respiratory distress.     Breath sounds: No rales.  Chest:     Chest wall: No tenderness.  Abdominal:     General: Bowel sounds are normal. There is no distension.     Palpations: Abdomen is soft.     Tenderness: There is no abdominal tenderness.  Musculoskeletal:        General: Normal range of motion.     Cervical back: Normal range of motion and neck supple.  Lymphadenopathy:     Cervical:  No cervical adenopathy.  Skin:    General: Skin is warm and dry.     Findings: No erythema.  Neurological:     Mental Status: She is alert and oriented to person, place, and time.     Cranial Nerves: No cranial nerve deficit.     Motor: No abnormal muscle tone.     Coordination: Coordination normal.  Psychiatric:        Mood and Affect: Affect normal.        Judgment: Judgment normal.      LABORATORY DATA:  I have reviewed the data as listed Lab Results  Component Value Date   WBC 31.3 (H) 09/14/2020   HGB 11.7 (L) 09/14/2020   HCT 37.2 09/14/2020   MCV 99.2 09/14/2020   PLT 177 09/14/2020   Recent Labs    09/22/19 1109 03/24/20 1251 06/23/20 1305 08/07/20 1547 08/11/20 0559 08/12/20 0607 09/14/20 0959  NA 137 139 138   < > 130* 135 140  K 3.2* 4.3 4.0   < > 4.0 4.4 3.9  CL 102 104 102   < > 98 100 103  CO2 25 27 27    < > 23 27 28   GLUCOSE 98 100* 86   < > 77 89 96  BUN 10 10 12    < > 22 22 11   CREATININE 1.07* 1.04* 0.91   < > 1.44* 1.33* 1.20*  CALCIUM 9.9 9.5 9.7   < > 9.7 10.0 9.4  GFRNONAA 51* 53* >60   < > 38* 42* 47*  GFRAA 59* >60  --   --   --   --   --   PROT 7.4 7.1 6.8  --   --   --  7.0  ALBUMIN 4.2 4.2 4.0  --   --   --  4.1  AST 16 13* 15  --   --   --  13*  ALT 12 13 13   --   --   --  12  ALKPHOS 62 57 55  --   --   --  61  BILITOT 0.3 0.3 0.5  --   --   --  0.5   < > = values in this interval not displayed.    Serum Osmolarity 266, urine Osm 374, urine sodium 21.    ASSESSMENT & PLAN:  1. CLL (chronic lymphocytic leukemia) ([place])   2. Tobacco abuse     # CLL,  Increased leukocytosis, Reactive versus secondary to CLL progression. Patient has no constitutional symptoms. I recommend continue watchful waiting.  # Tobacco abuse,  More than 30 pack year smoking history, cessation was discussed with patient.  I have referred her to establish with lung cancer screening program.  She has not established care yet.   All questions were  answered. The patient knows to call the clinic with any problems questions or concerns. Orders Placed This Encounter  Procedures  . CBC with Differential/Platelet    Standing Status:   Future    Standing Expiration Date:   09/14/2021  . Comprehensive metabolic panel    Standing Status:   Future    Standing Expiration Date:   09/14/2021  . Lactate dehydrogenase    Standing Status:   Future    Standing Expiration Date:   09/14/2021    Return of visit 3 months Earlie Server, MD, PhD Hematology Oncology [place] [facility] 09/14/2020

## 2020-09-20 ENCOUNTER — Other Ambulatory Visit: Payer: Self-pay | Admitting: Obstetrics and Gynecology

## 2020-10-22 ENCOUNTER — Ambulatory Visit: Payer: Medicare Other | Admitting: Oncology

## 2020-10-22 ENCOUNTER — Other Ambulatory Visit: Payer: Medicare Other

## 2020-11-02 DIAGNOSIS — R0981 Nasal congestion: Secondary | ICD-10-CM | POA: Diagnosis not present

## 2020-11-02 DIAGNOSIS — Z03818 Encounter for observation for suspected exposure to other biological agents ruled out: Secondary | ICD-10-CM | POA: Diagnosis not present

## 2020-11-02 DIAGNOSIS — J302 Other seasonal allergic rhinitis: Secondary | ICD-10-CM | POA: Diagnosis not present

## 2020-11-15 DIAGNOSIS — G4733 Obstructive sleep apnea (adult) (pediatric): Secondary | ICD-10-CM | POA: Diagnosis not present

## 2020-11-15 DIAGNOSIS — I2510 Atherosclerotic heart disease of native coronary artery without angina pectoris: Secondary | ICD-10-CM | POA: Diagnosis not present

## 2020-11-15 DIAGNOSIS — I10 Essential (primary) hypertension: Secondary | ICD-10-CM | POA: Diagnosis not present

## 2020-11-15 DIAGNOSIS — I340 Nonrheumatic mitral (valve) insufficiency: Secondary | ICD-10-CM | POA: Diagnosis not present

## 2020-11-15 DIAGNOSIS — I739 Peripheral vascular disease, unspecified: Secondary | ICD-10-CM | POA: Diagnosis not present

## 2020-11-15 DIAGNOSIS — E782 Mixed hyperlipidemia: Secondary | ICD-10-CM | POA: Diagnosis not present

## 2020-11-15 DIAGNOSIS — N390 Urinary tract infection, site not specified: Secondary | ICD-10-CM | POA: Diagnosis not present

## 2020-11-15 DIAGNOSIS — R079 Chest pain, unspecified: Secondary | ICD-10-CM | POA: Diagnosis not present

## 2020-11-15 DIAGNOSIS — R0602 Shortness of breath: Secondary | ICD-10-CM | POA: Diagnosis not present

## 2020-11-17 DIAGNOSIS — L570 Actinic keratosis: Secondary | ICD-10-CM | POA: Diagnosis not present

## 2020-11-17 DIAGNOSIS — D485 Neoplasm of uncertain behavior of skin: Secondary | ICD-10-CM | POA: Diagnosis not present

## 2020-11-17 DIAGNOSIS — D0439 Carcinoma in situ of skin of other parts of face: Secondary | ICD-10-CM | POA: Diagnosis not present

## 2020-12-14 ENCOUNTER — Other Ambulatory Visit: Payer: Self-pay

## 2020-12-14 ENCOUNTER — Inpatient Hospital Stay: Payer: Medicare Other | Attending: Oncology

## 2020-12-14 ENCOUNTER — Encounter: Payer: Self-pay | Admitting: Oncology

## 2020-12-14 ENCOUNTER — Inpatient Hospital Stay (HOSPITAL_BASED_OUTPATIENT_CLINIC_OR_DEPARTMENT_OTHER): Payer: Medicare Other | Admitting: Oncology

## 2020-12-14 VITALS — BP 175/87 | HR 52 | Temp 96.8°F | Resp 18 | Wt 154.6 lb

## 2020-12-14 DIAGNOSIS — F17210 Nicotine dependence, cigarettes, uncomplicated: Secondary | ICD-10-CM | POA: Insufficient documentation

## 2020-12-14 DIAGNOSIS — Z720 Tobacco use: Secondary | ICD-10-CM

## 2020-12-14 DIAGNOSIS — C9110 Chronic lymphocytic leukemia of B-cell type not having achieved remission: Secondary | ICD-10-CM | POA: Insufficient documentation

## 2020-12-14 LAB — COMPREHENSIVE METABOLIC PANEL
ALT: 14 U/L (ref 0–44)
AST: 16 U/L (ref 15–41)
Albumin: 4.3 g/dL (ref 3.5–5.0)
Alkaline Phosphatase: 65 U/L (ref 38–126)
Anion gap: 9 (ref 5–15)
BUN: 15 mg/dL (ref 8–23)
CO2: 29 mmol/L (ref 22–32)
Calcium: 9.6 mg/dL (ref 8.9–10.3)
Chloride: 103 mmol/L (ref 98–111)
Creatinine, Ser: 1.28 mg/dL — ABNORMAL HIGH (ref 0.44–1.00)
GFR, Estimated: 44 mL/min — ABNORMAL LOW (ref >60)
Glucose, Bld: 98 mg/dL (ref 70–99)
Potassium: 4.1 mmol/L (ref 3.5–5.1)
Sodium: 141 mmol/L (ref 135–145)
Total Bilirubin: 0.6 mg/dL (ref 0.3–1.2)
Total Protein: 7.2 g/dL (ref 6.5–8.1)

## 2020-12-14 LAB — CBC WITH DIFFERENTIAL/PLATELET
Abs Immature Granulocytes: 0.04 10*3/uL (ref 0.00–0.07)
Basophils Absolute: 0.1 10*3/uL (ref 0.0–0.1)
Basophils Relative: 0 %
Eosinophils Absolute: 0.2 10*3/uL (ref 0.0–0.5)
Eosinophils Relative: 1 %
HCT: 40.6 % (ref 36.0–46.0)
Hemoglobin: 13 g/dL (ref 12.0–15.0)
Immature Granulocytes: 0 %
Lymphocytes Relative: 80 %
Lymphs Abs: 21.5 10*3/uL — ABNORMAL HIGH (ref 0.7–4.0)
MCH: 31.3 pg (ref 26.0–34.0)
MCHC: 32 g/dL (ref 30.0–36.0)
MCV: 97.6 fL (ref 80.0–100.0)
Monocytes Absolute: 2 10*3/uL — ABNORMAL HIGH (ref 0.1–1.0)
Monocytes Relative: 8 %
Neutro Abs: 3 10*3/uL (ref 1.7–7.7)
Neutrophils Relative %: 11 %
Platelets: 185 10*3/uL (ref 150–400)
RBC: 4.16 MIL/uL (ref 3.87–5.11)
RDW: 12.4 % (ref 11.5–15.5)
Smear Review: ADEQUATE
WBC Morphology: ABNORMAL
WBC: 26.8 10*3/uL — ABNORMAL HIGH (ref 4.0–10.5)
nRBC: 0 % (ref 0.0–0.2)

## 2020-12-14 LAB — LACTATE DEHYDROGENASE: LDH: 138 U/L (ref 98–192)

## 2020-12-14 NOTE — Progress Notes (Signed)
Pt here for follow up. No new concerns voiced.   

## 2020-12-14 NOTE — Progress Notes (Signed)
Hematology/Oncology Follow up  note [facility] [phone] [phone]   Patient Care Team: Perrin Maltese, MD as PCP - General (Internal Medicine) Earlie Server, MD as Consulting Physician (Hematology and Oncology)  REFERRING PROVIDER:  Perrin Maltese, MD  REASON FOR VISIT Follow up for treatment of CLL  HISTORY OF PRESENTING ILLNESS:  Karen [name] is a  75 y.o.  female with PMH listed below who was referred to me for evaluation of persistent leukocytosis/lymphocytosis. Reviewed patient's lab work done at PCPs office. 05/01/2017 WBC 11.7 absolute lymphocytes 7.4. Hemoglobin 13 MCV 95 platelet counts 225,000. 06/22/2017 WBC is 17.5, absolute lymphocyte 11.7 hemoglobin 11.9, MCV 92, platelet count 240,000. Patient reports feeling tired at baseline. Otherwise denies any night sweats, fever or chills. She reports she got sick around holiday. But today she feels better. She is active smoker, currently 1 or 2 cigarettes a day  # 10/14/2015 chest without contrast Images were independently reviewed by me Small pulmonary nodules are stable over the past year.  Considered benign.  Several lymph nodes are borderline enlarged, including the lower right paratracheal lymph node and a portacaval lymph node. 08/27/2017 US abdomen complete, no hepato-splenomegaly. 08/07/2020, patient was diagnosed with COVID-19 was hospitalized.   INTERVAL HISTORY Karen [name] is a 75 y.o. female who has above history reviewed by me today present for follow-up visit for CLL Patient reports feeling well.  Appetite is fair.  She has gained weight since her last visit in February.  Weight is at her baseline now.   Review of Systems  Constitutional: Positive for fatigue. Negative for appetite change, chills, fever and unexpected weight change.  HENT:   Negative for hearing loss and voice change.   Eyes: Negative for eye problems.  Respiratory: Negative for chest tightness and  cough.   Cardiovascular: Negative for chest pain.  Gastrointestinal: Negative for abdominal distention, abdominal pain and blood in stool.  Endocrine: Negative for hot flashes.  Genitourinary: Negative for difficulty urinating and frequency.   Musculoskeletal: Negative for arthralgias.  Skin: Negative for itching and rash.  Neurological: Negative for extremity weakness.  Hematological: Negative for adenopathy.  Psychiatric/Behavioral: Negative for confusion.     MEDICAL HISTORY:  Past Medical History:  Diagnosis Date  . Anxiety   . Artery occlusion   . Asthma   . Back pain   . Bronchitis   . Cancer ([place])    Skin Cancer  . CLL (chronic lymphocytic leukemia) ([place])   . Coronary artery disease   . Depression   . Dyspnea   . GERD (gastroesophageal reflux disease)   . History of kidney stones   . Hypercholesteremia   . Hyperlipemia   . Hypertension   . Hypothyroidism   . Myocardial infarction ([place]) 1999  . Neuropathy   . Restless leg syndrome   . Sleep apnea    use C-PAP  . Wears dentures    full upper, partial lower.  doesn't currently wear    SURGICAL HISTORY: Past Surgical History:  Procedure Laterality Date  . COLONOSCOPY WITH PROPOFOL N/A 11/20/2016   Procedure: COLONOSCOPY WITH PROPOFOL;  Surgeon: Lucilla Lame, MD;  Location: [place];  Service: Endoscopy;  Laterality: N/A;  sleep apnea  . CORONARY ANGIOPLASTY WITH STENT PLACEMENT  2008  . DILATION AND CURETTAGE OF UTERUS    . EYE SURGERY Bilateral    Cataract Extraction with IOL  . HAND SURGERY Left 12/2017   upper hand fracture repair  . LEFT HEART CATH  AND CORONARY ANGIOGRAPHY N/A 06/29/2017   Procedure: LEFT HEART CATH AND CORONARY ANGIOGRAPHY;  Surgeon: Dionisio David, MD;  Location: [place] [facility];  Service: Cardiovascular;  Laterality: N/A;  . LEFT HEART CATH AND CORONARY ANGIOGRAPHY Left 12/02/2018   Procedure: LEFT HEART CATH AND CORONARY ANGIOGRAPHY;  Surgeon: Dionisio David, MD;   Location: [place] [facility];  Service: Cardiovascular;  Laterality: Left;  . OPEN REDUCTION INTERNAL FIXATION (ORIF) DISTAL RADIAL FRACTURE Right 08/12/2018   Procedure: OPEN REDUCTION INTERNAL FIXATION (ORIF) DISTAL RADIAL FRACTURE;  Surgeon: [name], MD;  Location: [facility];  Service: Orthopedics;  Laterality: Right;  . POLYPECTOMY  11/20/2016   Procedure: POLYPECTOMY;  Surgeon: Lucilla Lame, MD;  Location: [facility];  Service: Endoscopy;;  . TUBAL LIGATION    . VAGINAL HYSTERECTOMY    . VULVECTOMY Right 01/31/2016   Procedure: WIDE EXCISION VULVECTOMY-RIGHT LABIA;  Surgeon: Brayton Mars, MD;  Location: [facility];  Service: Gynecology;  Laterality: Right;    SOCIAL HISTORY: Social History   Socioeconomic History  . Marital status: Single    Spouse name: Not on file  . Number of children: 5  . Years of education: Not on file  . Highest education level: Not on file  Occupational History  . Occupation: part time job    Comment: Engineer, building services  Tobacco Use  . Smoking status: Current Every Day Smoker    Packs/day: 1.00    Years: 35.00    Pack years: 35.00    Types: Cigarettes  . Smokeless tobacco: Current User    Types: Snuff  Vaping Use  . Vaping Use: Never used  Substance and Sexual Activity  . Alcohol use: No    Alcohol/week: 0.0 standard drinks  . Drug use: No  . Sexual activity: Not Currently    Partners: Male    Birth control/protection: Surgical  Other Topics Concern  . Not on file  Social History Narrative  . Not on file   Social Determinants of Health   Financial Resource Strain: Not on file  Food Insecurity: Not on file  Transportation Needs: Not on file  Physical Activity: Not on file  Stress: Not on file  Social Connections: Not on file  Intimate Partner Violence: Not on file    FAMILY HISTORY: Family History  Problem Relation Age of Onset  . Heart disease Father   . Stroke Mother   . Hypertension Mother   . Lung cancer  Brother   . Heart attack Brother   . Congestive Heart Failure Sister   . Cervical cancer Sister   . Cancer Neg Hx   . Diabetes Neg Hx   . Breast cancer Neg Hx     ALLERGIES:  is allergic to requip [ropinirole hcl], celebrex [celecoxib], meloxicam, and nitrofurantoin.  MEDICATIONS:  Current Outpatient Medications  Medication Sig Dispense Refill  . acetaminophen (TYLENOL) 500 MG tablet Take 500-1,000 mg by mouth every 6 (six) hours as needed for moderate pain or fever.    Marland Kitchen albuterol (VENTOLIN HFA) 108 (90 Base) MCG/ACT inhaler Inhale 1-2 puffs into the lungs every 6 (six) hours as needed for wheezing or shortness of breath.    Marland Kitchen aspirin EC 81 MG tablet Take 1 tablet (81 mg total) by mouth daily. 30 tablet   . atorvastatin (LIPITOR) 40 MG tablet Take 1 tablet (40 mg total) by mouth daily. 30 tablet 2  . budesonide-formoterol (SYMBICORT) 160-4.5 MCG/ACT inhaler Inhale 2 puffs into the lungs 2 (two) times  daily as needed (shortness of breath).    Marland Kitchen buPROPion (WELLBUTRIN XL) 150 MG 24 hr tablet Take 150 mg by mouth at bedtime.    . chlorthalidone (HYGROTON) 25 MG tablet Hold until followup with outpatient provider due to acute kidney injury. (Patient not taking: Reported on 09/14/2020)    . clopidogrel (PLAVIX) 75 MG tablet Take 75 mg by mouth daily.     Marland Kitchen conjugated estrogens (PREMARIN) vaginal cream USE 1 GRAM INTRAVAGINALLY TWICE PER WEEK 30 g 3  . dexlansoprazole (DEXILANT) 60 MG capsule Take 60 mg by mouth daily.     . fluticasone (FLONASE) 50 MCG/ACT nasal spray Place 2 sprays into both nostrils daily as needed for allergies or rhinitis.    Marland Kitchen gabapentin (NEURONTIN) 400 MG capsule Take 400 mg by mouth 3 (three) times daily.    . isosorbide mononitrate (IMDUR) 60 MG 24 hr tablet Take 60 mg by mouth daily.    Marland Kitchen lisinopril (ZESTRIL) 40 MG tablet Hold until followup with outpatient provider due to acute kidney injury.    Marland Kitchen loratadine (CLARITIN) 10 MG tablet Take 10 mg by mouth daily.    .  meclizine (ANTIVERT) 25 MG tablet Take 25 mg by mouth 2 (two) times daily as needed.    . metoprolol succinate (TOPROL-XL) 25 MG 24 hr tablet Take 25 mg by mouth daily.     . ondansetron (ZOFRAN-ODT) 4 MG disintegrating tablet Take 4 mg by mouth every 8 (eight) hours as needed.    Marland Kitchen REPATHA 140 MG/ML SOSY Inject 140 mg into the skin every 14 (fourteen) days. (Patient not taking: Reported on 09/14/2020)    . Tetrahydrozoline HCl (VISINE OP) Place 1 drop into both eyes daily as needed (dry eyes).     No current facility-administered medications for this visit.     PHYSICAL EXAMINATION: ECOG PERFORMANCE STATUS: 1 - Symptomatic but completely ambulatory Vitals:   12/14/20 1325  BP: (!) 175/87  Pulse: (!) 52  Resp: 18  Temp: (!) 96.8 F (36 C)   Filed Weights   12/14/20 1325  Weight: 154 lb 9.6 oz (70.1 kg)    Physical Exam Constitutional:      General: She is not in acute distress.    Appearance: She is not diaphoretic.  HENT:     Head: Normocephalic and atraumatic.     Nose: Nose normal.     Mouth/Throat:     Pharynx: No oropharyngeal exudate.  Eyes:     General: No scleral icterus.       Left eye: No discharge.     Conjunctiva/sclera: Conjunctivae normal.     Pupils: Pupils are equal, round, and reactive to light.  Neck:     Vascular: No JVD.  Cardiovascular:     Rate and Rhythm: Normal rate and regular rhythm.     Heart sounds: No murmur heard.   Pulmonary:     Effort: Pulmonary effort is normal. No respiratory distress.     Breath sounds: No rales.  Chest:     Chest wall: No tenderness.  Abdominal:     General: Bowel sounds are normal. There is no distension.     Palpations: Abdomen is soft.     Tenderness: There is no abdominal tenderness.  Musculoskeletal:        General: Normal range of motion.     Cervical back: Normal range of motion and neck supple.  Lymphadenopathy:     Cervical: No cervical adenopathy.  Skin:    General:  Skin is warm and dry.      Findings: No erythema.  Neurological:     Mental Status: She is alert and oriented to person, place, and time.     Cranial Nerves: No cranial nerve deficit.     Motor: No abnormal muscle tone.     Coordination: Coordination normal.  Psychiatric:        Mood and Affect: Affect normal.        Judgment: Judgment normal.      LABORATORY DATA:  I have reviewed the data as listed Lab Results  Component Value Date   WBC 31.3 (H) 09/14/2020   HGB 11.7 (L) 09/14/2020   HCT 37.2 09/14/2020   MCV 99.2 09/14/2020   PLT 177 09/14/2020   Recent Labs    03/24/20 1251 06/23/20 1305 08/07/20 1547 08/11/20 0559 08/12/20 0607 09/14/20 0959  NA 139 138   < > 130* 135 140  K 4.3 4.0   < > 4.0 4.4 3.9  CL 104 102   < > 98 100 103  CO2 27 27   < > 23 27 28   GLUCOSE 100* 86   < > 77 89 96  BUN 10 12   < > 22 22 11   CREATININE 1.04* 0.91   < > 1.44* 1.33* 1.20*  CALCIUM 9.5 9.7   < > 9.7 10.0 9.4  GFRNONAA 53* >60   < > 38* 42* 47*  GFRAA >60  --   --   --   --   --   PROT 7.1 6.8  --   --   --  7.0  ALBUMIN 4.2 4.0  --   --   --  4.1  AST 13* 15  --   --   --  13*  ALT 13 13  --   --   --  12  ALKPHOS 57 55  --   --   --  61  BILITOT 0.3 0.5  --   --   --  0.5   < > = values in this interval not displayed.    Serum Osmolarity 266, urine Osm 374, urine sodium 21.    ASSESSMENT & PLAN:  1. CLL (chronic lymphocytic leukemia) ([place])   2. Tobacco abuse     # CLL,  Leukocytosis has improved and back to her baseline. No constitutional symptoms.  Continue watchful waiting  # Tobacco abuse,  More than 30 pack year smoking history,.  I have referred her to establish with lung cancer screening program and she has not had a lung cancer screening CT done yet. Smoke cessation was discussed with her.   All questions were answered. The patient knows to call the clinic with any problems questions or concerns.   Return of visit 6 months Earlie Server, MD, PhD Hematology Oncology [place] [facility] 12/14/2020

## 2020-12-27 DIAGNOSIS — R519 Headache, unspecified: Secondary | ICD-10-CM | POA: Diagnosis not present

## 2020-12-27 DIAGNOSIS — I10 Essential (primary) hypertension: Secondary | ICD-10-CM | POA: Diagnosis not present

## 2020-12-27 DIAGNOSIS — Z8673 Personal history of transient ischemic attack (TIA), and cerebral infarction without residual deficits: Secondary | ICD-10-CM | POA: Diagnosis not present

## 2020-12-27 DIAGNOSIS — H8113 Benign paroxysmal vertigo, bilateral: Secondary | ICD-10-CM | POA: Diagnosis not present

## 2021-01-17 DIAGNOSIS — I2510 Atherosclerotic heart disease of native coronary artery without angina pectoris: Secondary | ICD-10-CM | POA: Diagnosis not present

## 2021-01-17 DIAGNOSIS — I739 Peripheral vascular disease, unspecified: Secondary | ICD-10-CM | POA: Diagnosis not present

## 2021-01-17 DIAGNOSIS — I340 Nonrheumatic mitral (valve) insufficiency: Secondary | ICD-10-CM | POA: Diagnosis not present

## 2021-01-17 DIAGNOSIS — G4733 Obstructive sleep apnea (adult) (pediatric): Secondary | ICD-10-CM | POA: Diagnosis not present

## 2021-01-17 DIAGNOSIS — J449 Chronic obstructive pulmonary disease, unspecified: Secondary | ICD-10-CM | POA: Diagnosis not present

## 2021-01-17 DIAGNOSIS — I10 Essential (primary) hypertension: Secondary | ICD-10-CM | POA: Diagnosis not present

## 2021-01-17 DIAGNOSIS — R0602 Shortness of breath: Secondary | ICD-10-CM | POA: Diagnosis not present

## 2021-01-17 DIAGNOSIS — E538 Deficiency of other specified B group vitamins: Secondary | ICD-10-CM | POA: Diagnosis not present

## 2021-01-17 DIAGNOSIS — E782 Mixed hyperlipidemia: Secondary | ICD-10-CM | POA: Diagnosis not present

## 2021-01-17 DIAGNOSIS — F17200 Nicotine dependence, unspecified, uncomplicated: Secondary | ICD-10-CM | POA: Diagnosis not present

## 2021-01-17 DIAGNOSIS — R079 Chest pain, unspecified: Secondary | ICD-10-CM | POA: Diagnosis not present

## 2021-01-17 DIAGNOSIS — K219 Gastro-esophageal reflux disease without esophagitis: Secondary | ICD-10-CM | POA: Diagnosis not present

## 2021-01-23 IMAGING — MR MR MRA HEAD W/O CM
1 series · 19 of 48 positions shown · non-contrast
Comparison: MRI head 08/08/2020

CLINICAL DATA: Acute stroke on recent MRI.

EXAM:
MRA HEAD WITHOUT CONTRAST
TECHNIQUE: Angiographic images of the Circle of Willis were obtained using MRA
technique without intravenous contrast.

[Series 5: TOF · axial · 0.5mm · 0.41mm/px · z∈[-101,-4]mm · 19 of 205 slices shown]
[im 1/205]
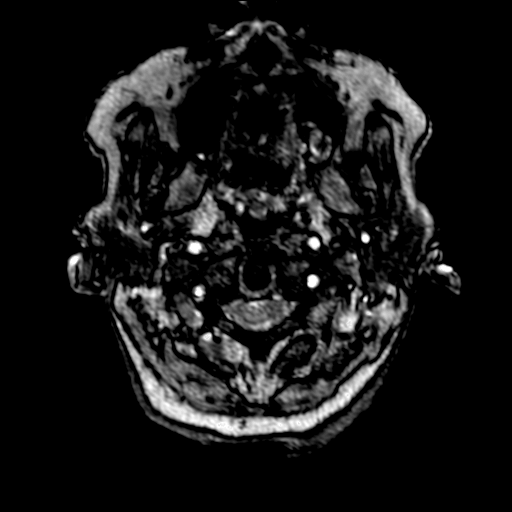
[im 5/205]
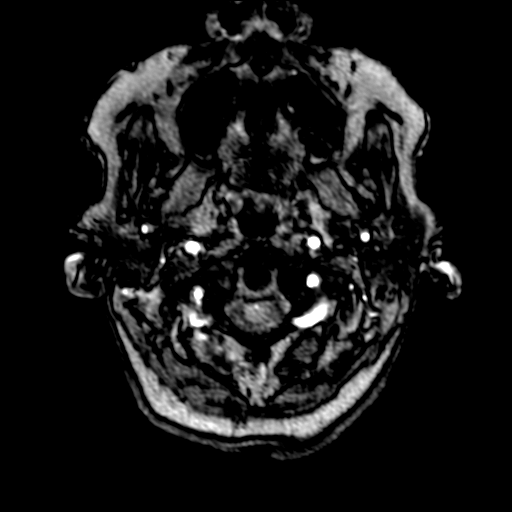
[im 9/205]
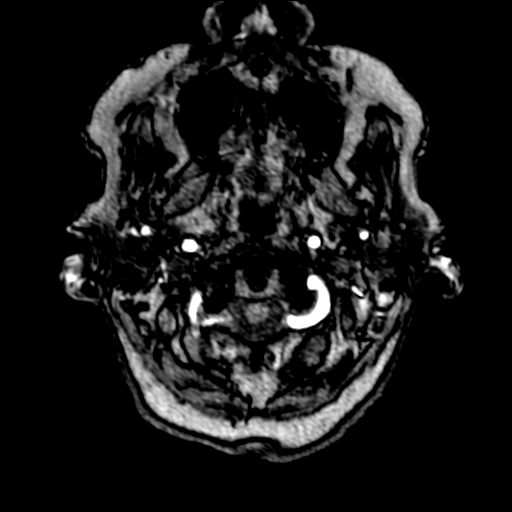
[im 14/205]
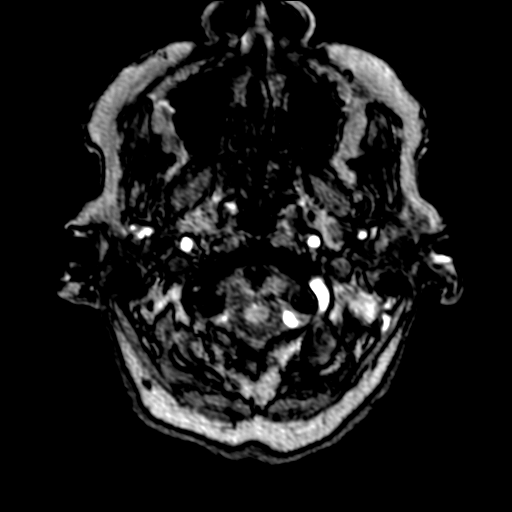
[im 18/205]
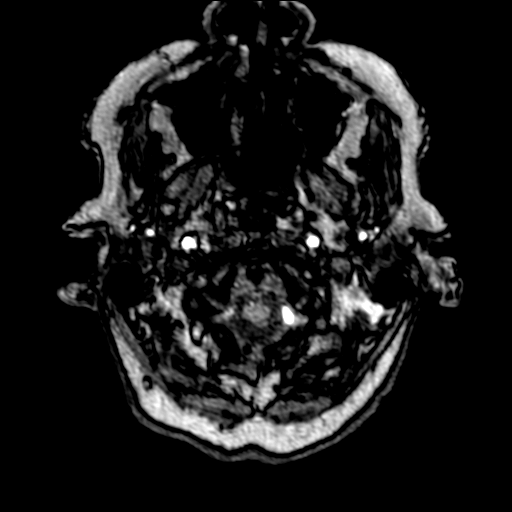
[im 22/205]
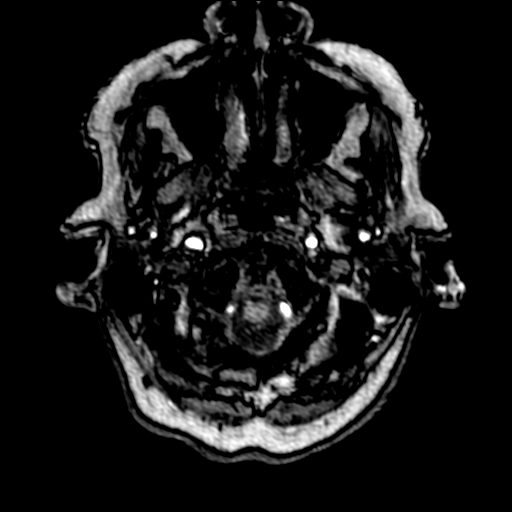
[im 27/205]
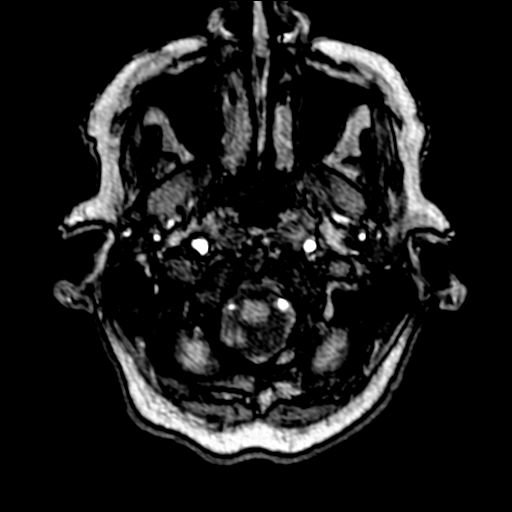
[im 31/205]
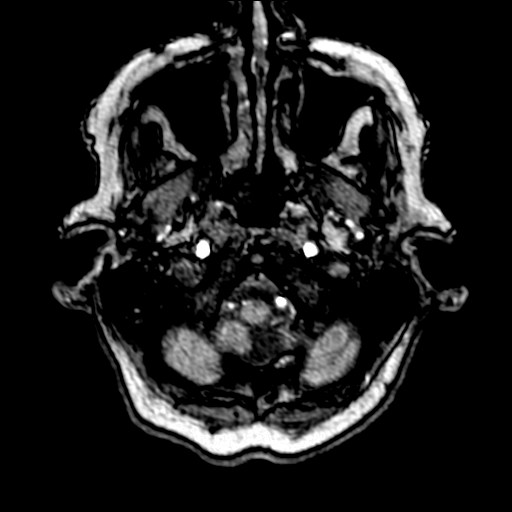
[im 35/205]
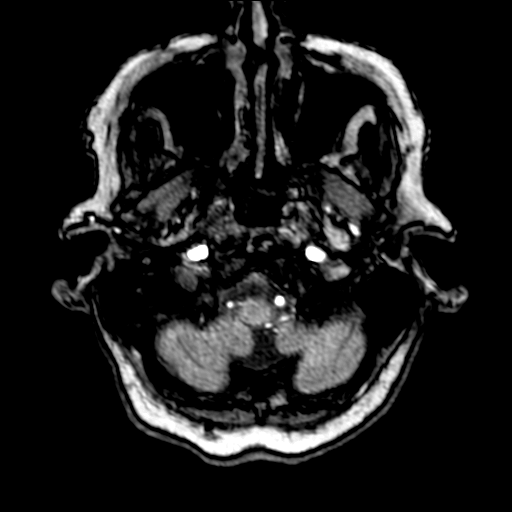
[im 40/205]
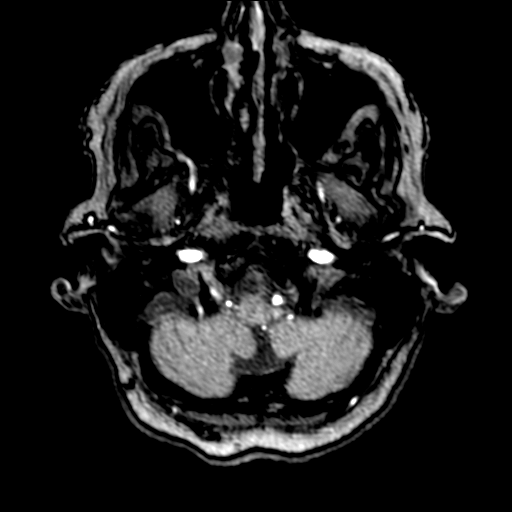
[im 44/205]
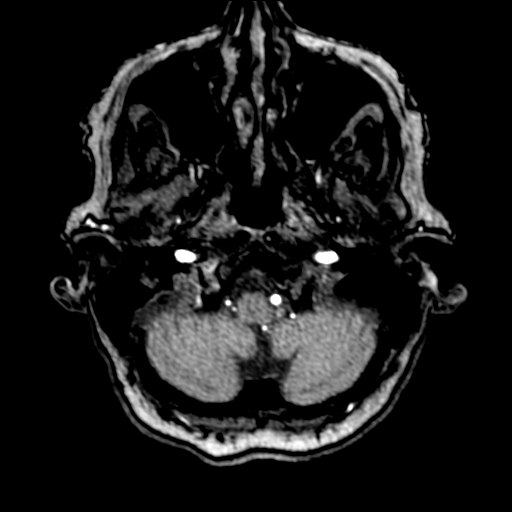
[im 66/205]
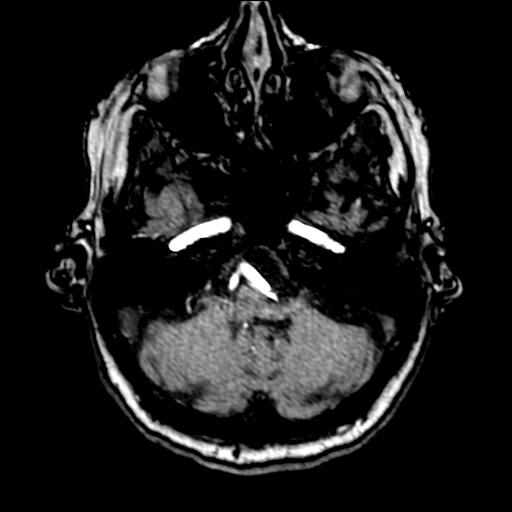
[im 92/205]
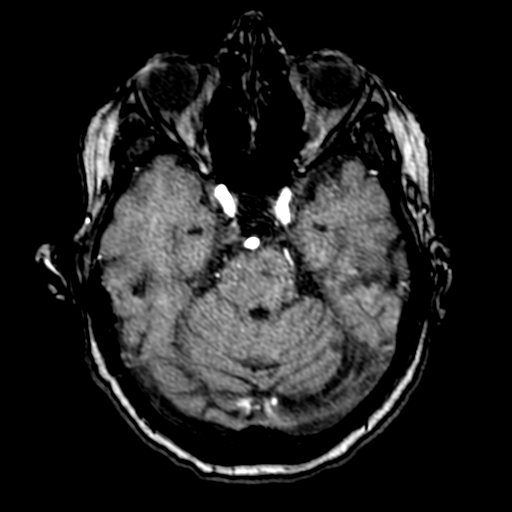
[im 105/205]
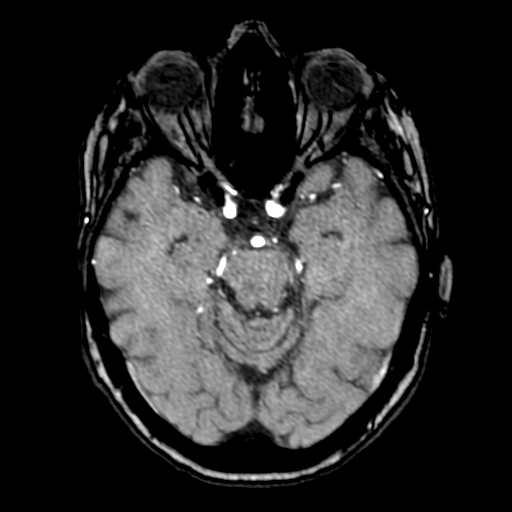
[im 118/205]
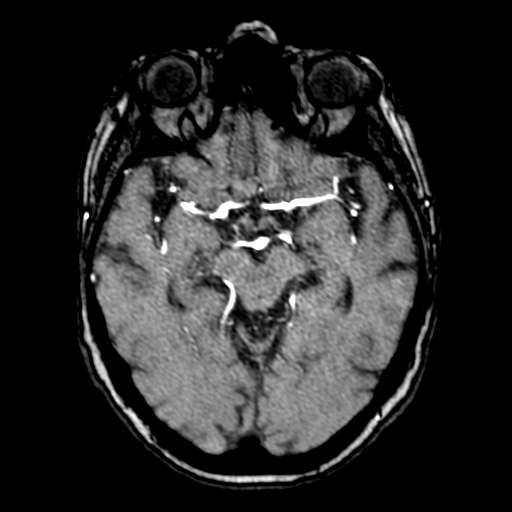
[im 144/205]
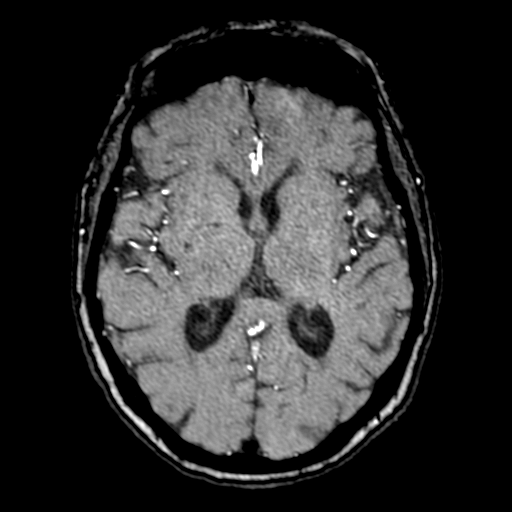
[im 170/205]
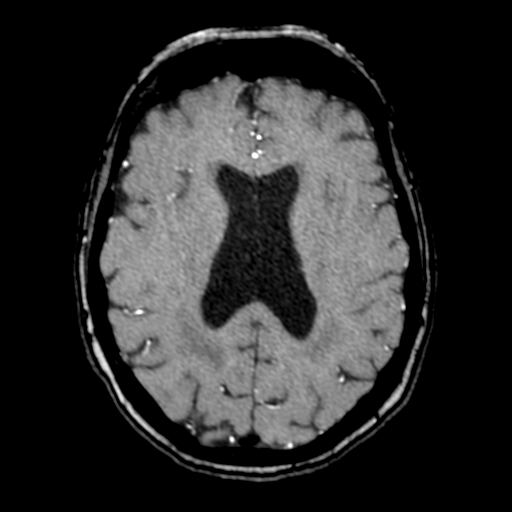
[im 174/205]
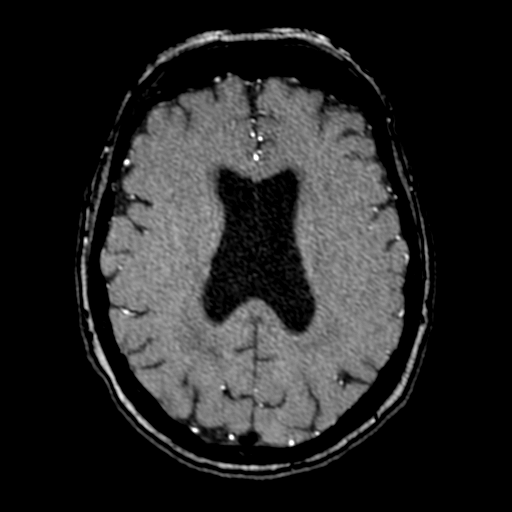
[im 196/205]
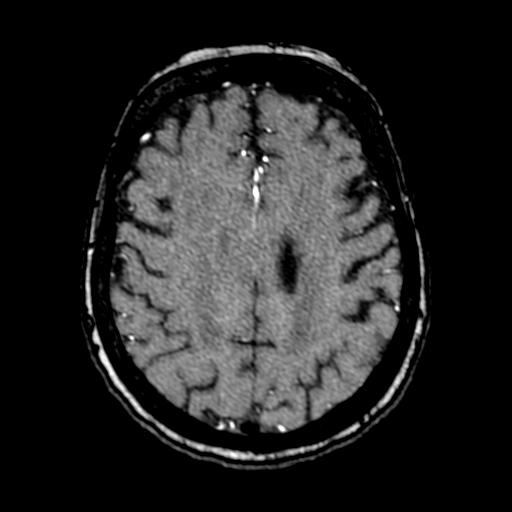

[19 of 48 positions shown; findings below may reference images not displayed]

FINDINGS: Anterior circulation: Bilateral internal carotid arteries are patent
through the carotid siphons. Bilateral M1 and proximal M2 vessels
are patent. Bilateral ACA vessels are patent. No visible
hemodynamically significant proximal arterial stenosis. Multifocal
mild atherosclerotic narrowing throughout the anterior circulation.
No aneurysm identified.

Posterior circulation: Left dominant vertebral artery. Bilateral
vertebral arteries are patent. Suspected moderate to severe
narrowing of the right vertebral artery as it becomes intradural,
although this area is not well evaluated on this study. By basilar
artery and bilateral PCAs are patent. Multifocal mild
atherosclerotic narrowing throughout the posterior circulation.
IMPRESSION: 1. No emergent large vessel occlusion.
2. Suspected moderate to severe narrowing of the proximal intradural
non dominant right vertebral artery, although this area is not well
evaluated on this study. Otherwise, there is multifocal mild
atherosclerotic narrowing without evidence of proximal
hemodynamically significant stenosis.

## 2021-02-16 ENCOUNTER — Emergency Department
Admission: EM | Admit: 2021-02-16 | Discharge: 2021-02-16 | Disposition: A | Discharge status: Home / Self Care | Payer: Medicare Other | Attending: Emergency Medicine | Admitting: Emergency Medicine

## 2021-02-16 ENCOUNTER — Other Ambulatory Visit: Payer: Self-pay

## 2021-02-16 DIAGNOSIS — W57XXXA Bitten or stung by nonvenomous insect and other nonvenomous arthropods, initial encounter: Secondary | ICD-10-CM | POA: Diagnosis not present

## 2021-02-16 DIAGNOSIS — I10 Essential (primary) hypertension: Secondary | ICD-10-CM | POA: Insufficient documentation

## 2021-02-16 DIAGNOSIS — F17210 Nicotine dependence, cigarettes, uncomplicated: Secondary | ICD-10-CM | POA: Insufficient documentation

## 2021-02-16 DIAGNOSIS — S30861A Insect bite (nonvenomous) of abdominal wall, initial encounter: Secondary | ICD-10-CM | POA: Insufficient documentation

## 2021-02-16 DIAGNOSIS — Z7982 Long term (current) use of aspirin: Secondary | ICD-10-CM | POA: Diagnosis not present

## 2021-02-16 DIAGNOSIS — I2510 Atherosclerotic heart disease of native coronary artery without angina pectoris: Secondary | ICD-10-CM | POA: Diagnosis not present

## 2021-02-16 DIAGNOSIS — Z7902 Long term (current) use of antithrombotics/antiplatelets: Secondary | ICD-10-CM | POA: Diagnosis not present

## 2021-02-16 DIAGNOSIS — J45909 Unspecified asthma, uncomplicated: Secondary | ICD-10-CM | POA: Insufficient documentation

## 2021-02-16 DIAGNOSIS — Z85828 Personal history of other malignant neoplasm of skin: Secondary | ICD-10-CM | POA: Insufficient documentation

## 2021-02-16 DIAGNOSIS — Z79899 Other long term (current) drug therapy: Secondary | ICD-10-CM | POA: Insufficient documentation

## 2021-02-16 DIAGNOSIS — E039 Hypothyroidism, unspecified: Secondary | ICD-10-CM | POA: Diagnosis not present

## 2021-02-16 MED ORDER — TRIAMCINOLONE ACETONIDE 0.1 % EX CREA: 1.0000 "application " | TOPICAL_CREAM | Freq: Four times a day (QID) | CUTANEOUS | 0 refills | Status: DC

## 2021-02-16 NOTE — ED Triage Notes (Signed)
Pt comes pov with rash on inner thighs and abdomen since Sunday.

## 2021-02-16 NOTE — ED Provider Notes (Signed)
[facility] Emergency Department Provider Note  ____________________________________________  Time seen: Approximately 6:43 PM  I have reviewed the triage vital signs and the nursing notes.   HISTORY  Chief Complaint Rash    HPI Karen [name] is a 75 y.o. female who presents emergency department for evaluation of a rash.  Patient states that she has developed several areas of spreading rash over the past several days.  She states that initially it started with a few red "bumps" that were very pruritic in nature.  She scratched them to the point of excoriating them and creating small little scabs over each of these bumps.  Patient has had a progression of these small red bumps spreading across her abdomen and her extremities.  There is no facial or neck involvement.  They are all pruritic in nature.  No accompanying symptoms other than rash.  Patient denies any new foods, medications, topicals.  No recent travel.       Past Medical History:  Diagnosis Date   Anxiety    Artery occlusion    Asthma    Back pain    Bronchitis    Cancer (HCC)    Skin Cancer   CLL (chronic lymphocytic leukemia) (HCC)    Coronary artery disease    Depression    Dyspnea    GERD (gastroesophageal reflux disease)    History of kidney stones    Hypercholesteremia    Hyperlipemia    Hypertension    Hypothyroidism    Myocardial infarction ([place]) 1999   Neuropathy    Restless leg syndrome    Sleep apnea    use C-PAP   Wears dentures    full upper, partial lower.  doesn't currently wear    Patient Active Problem List   Diagnosis Date Noted   Tobacco abuse 09/14/2020   Weakness    AKI (acute kidney injury) ([place])    Hyponatremia 08/07/2020   Dysgeusia 08/07/2020   CLL (chronic lymphocytic leukemia) ([place]) 12/18/2017   Unstable angina ([place]) 06/22/2017   Inclusion cyst of vulva 12/14/2016   Lumbar spondylosis 08/14/2016   Trochanteric bursitis of right hip 08/14/2016    Herpes simplex virus (HSV) infection 12/09/2015   History of total vaginal hysterectomy (TVH) 11/25/2015   Vaginal odor 11/25/2015   History of UTI 11/25/2015   Vaginal atrophy 11/25/2015   Dyspareunia in female 11/25/2015   Tendinitis of wrist 05/26/2015    Past Surgical History:  Procedure Laterality Date   COLONOSCOPY WITH PROPOFOL N/A 11/20/2016   Procedure: COLONOSCOPY WITH PROPOFOL;  Surgeon: Lucilla Lame, MD;  Location: [place];  Service: Endoscopy;  Laterality: N/A;  sleep apnea   CORONARY ANGIOPLASTY WITH STENT PLACEMENT  2008   DILATION AND CURETTAGE OF UTERUS     EYE SURGERY Bilateral    Cataract Extraction with IOL   HAND SURGERY Left 12/2017   upper hand fracture repair   LEFT HEART CATH AND CORONARY ANGIOGRAPHY N/A 06/29/2017   Procedure: LEFT HEART CATH AND CORONARY ANGIOGRAPHY;  Surgeon: Dionisio David, MD;  Location: [place] [facility];  Service: Cardiovascular;  Laterality: N/A;   LEFT HEART CATH AND CORONARY ANGIOGRAPHY Left 12/02/2018   Procedure: LEFT HEART CATH AND CORONARY ANGIOGRAPHY;  Surgeon: Dionisio David, MD;  Location: [place] [facility];  Service: Cardiovascular;  Laterality: Left;   OPEN REDUCTION INTERNAL FIXATION (ORIF) DISTAL RADIAL FRACTURE Right 08/12/2018   Procedure: OPEN REDUCTION INTERNAL FIXATION (ORIF) DISTAL RADIAL FRACTURE;  Surgeon: [name], MD;  Location: [facility];  Service: Orthopedics;  Laterality: Right;   POLYPECTOMY  11/20/2016   Procedure: POLYPECTOMY;  Surgeon: Lucilla Lame, MD;  Location: [place];  Service: Endoscopy;;   TUBAL LIGATION     VAGINAL HYSTERECTOMY     VULVECTOMY Right 01/31/2016   Procedure: WIDE EXCISION VULVECTOMY-RIGHT LABIA;  Surgeon: Brayton Mars, MD;  Location: [facility];  Service: Gynecology;  Laterality: Right;    Prior to Admission medications   Medication Sig Start Date End Date Taking? Authorizing Provider  triamcinolone cream (KENALOG) 0.1 % Apply 1 application  topically 4 (four) times daily. 02/16/21  Yes Lucille Crichlow, Charline Bills, PA-C  acetaminophen (TYLENOL) 500 MG tablet Take 500-1,000 mg by mouth every 6 (six) hours as needed for moderate pain or fever.    [provider]  albuterol (VENTOLIN HFA) 108 (90 Base) MCG/ACT inhaler Inhale 1-2 puffs into the lungs every 6 (six) hours as needed for wheezing or shortness of breath.    [provider]  aspirin EC 81 MG tablet Take 1 tablet (81 mg total) by mouth daily. 08/12/20   Enzo Bi, MD  atorvastatin (LIPITOR) 40 MG tablet Take 1 tablet (40 mg total) by mouth daily. 08/13/20 11/11/20  Enzo Bi, MD  budesonide-formoterol [facility]) 160-4.5 MCG/ACT inhaler Inhale 2 puffs into the lungs 2 (two) times daily as needed (shortness of breath).    [provider]  buPROPion (WELLBUTRIN XL) 150 MG 24 hr tablet Take 150 mg by mouth at bedtime. 05/08/20   [provider]  chlorthalidone (HYGROTON) 25 MG tablet Hold until followup with outpatient provider due to acute kidney injury. Patient not taking: No sig reported 08/12/20   Enzo Bi, MD  clopidogrel (PLAVIX) 75 MG tablet Take 75 mg by mouth daily.     [provider]  conjugated estrogens (PREMARIN) vaginal cream USE 1 GRAM INTRAVAGINALLY TWICE PER WEEK 09/20/20   Rubie Maid, MD  dexlansoprazole (DEXILANT) 60 MG capsule Take 60 mg by mouth daily.     [provider]  fluticasone (FLONASE) 50 MCG/ACT nasal spray Place 2 sprays into both nostrils daily as needed for allergies or rhinitis.    [provider]  gabapentin (NEURONTIN) 400 MG capsule Take 400 mg by mouth 3 (three) times daily.    [provider]  isosorbide mononitrate (IMDUR) 60 MG 24 hr tablet Take 60 mg by mouth daily.    [provider]  lisinopril (ZESTRIL) 40 MG tablet Hold until followup with outpatient provider due to acute kidney injury. 08/12/20   Enzo Bi, MD  loratadine (CLARITIN) 10 MG tablet Take 10 mg by mouth  daily. 03/15/20   [provider]  meclizine (ANTIVERT) 25 MG tablet Take 25 mg by mouth 2 (two) times daily as needed. 09/11/20   [provider]  metoprolol succinate (TOPROL-XL) 25 MG 24 hr tablet Take 25 mg by mouth daily.     [provider]  ondansetron (ZOFRAN-ODT) 4 MG disintegrating tablet Take 4 mg by mouth every 8 (eight) hours as needed. 08/17/20   [provider]  REPATHA 140 MG/ML SOSY Inject 140 mg into the skin every 14 (fourteen) days. Patient not taking: No sig reported    [provider]  Tetrahydrozoline HCl (VISINE OP) Place 1 drop into both eyes daily as needed (dry eyes).    [provider]    Allergies Requip [ropinirole hcl], Celebrex [celecoxib], Meloxicam, and Nitrofurantoin  Family History  Problem Relation Age of Onset  Heart disease Father    Stroke Mother    Hypertension Mother    Lung cancer Brother    Heart attack Brother    Congestive Heart Failure Sister    Cervical cancer Sister    Cancer Neg Hx    Diabetes Neg Hx    Breast cancer Neg Hx     Social History Social History   Tobacco Use   Smoking status: Every Day    Packs/day: 1.00    Years: 35.00    Pack years: 35.00    Types: Cigarettes   Smokeless tobacco: Current    Types: Snuff  Vaping Use   Vaping Use: Never used  Substance Use Topics   Alcohol use: No    Alcohol/week: 0.0 standard drinks   Drug use: No     Review of Systems  Constitutional: No fever/chills Eyes: No visual changes. No discharge ENT: No upper respiratory complaints. Cardiovascular: no chest pain. Respiratory: no cough. No SOB. Gastrointestinal: No abdominal pain.  No nausea, no vomiting.  No diarrhea.  No constipation. Musculoskeletal: Negative for musculoskeletal pain. Skin: Erythematous papular rash spreading over the past 4 days Neurological: Negative for headaches, focal weakness or numbness.  10 System ROS otherwise  negative.  ____________________________________________   PHYSICAL EXAM:  VITAL SIGNS: ED Triage Vitals [02/16/21 1611]  Enc Vitals Group     BP (!) 151/87     Pulse Rate 66     Resp 18     Temp 98.3 F (36.8 C)     Temp Source Oral     SpO2 97 %     Weight 158 lb (71.7 kg)     Height 5\' 2"  (1.575 m)     Head Circumference      Peak Flow      Pain Score 0     Pain Loc      Pain Edu?      Excl. in [place]?      Constitutional: Alert and oriented. Well appearing and in no acute distress. Eyes: Conjunctivae are normal. PERRL. EOMI. Head: Atraumatic. ENT:      Ears:       Nose: No congestion/rhinnorhea.      Mouth/Throat: Mucous membranes are moist.  Neck: No stridor.    Cardiovascular: Normal rate, regular rhythm. Normal S1 and S2.  Good peripheral circulation. Respiratory: Normal respiratory effort without tachypnea or retractions. Lungs CTAB. Good air entry to the bases with no decreased or absent breath sounds. Musculoskeletal: Full range of motion to all extremities. No gross deformities appreciated. Neurologic:  Normal speech and language. No gross focal neurologic deficits are appreciated.  Skin:  Skin is warm, dry and intact.  Patient has scattered erythematous papular style lesions with multiple having excoriations and scabbing consistent with bedbug versus flea bite.  No surrounding findings concerning for cellulitis.  No vesicle formation.  No patches or wheals. Psychiatric: Mood and affect are normal. Speech and behavior are normal. Patient exhibits appropriate insight and judgement.   ____________________________________________   LABS (all labs ordered are listed, but only abnormal results are displayed)  Labs Reviewed - No data to display ____________________________________________  EKG   ____________________________________________  RADIOLOGY   No results found.  ____________________________________________    PROCEDURES  Procedure(s)  performed:    Procedures    Medications - No data to display   ____________________________________________   INITIAL IMPRESSION / ASSESSMENT AND PLAN / ED COURSE  Pertinent labs & imaging results that were available during my care  of the patient were reviewed by me and considered in my medical decision making (see chart for details).  Review of the [place] CSRS was performed in accordance of the [place] prior to dispensing any controlled drugs.           Patient's diagnosis is consistent with bug bites.  Patient presented to the emergency department with spreading rash.  It appears the patient is being bit likely by bedbugs versus fleas.  Patient has a excoriated several of these lesions to the point of having scabbing.  There is no evidence of cellulitis.  No hives or wheals.  No plaque formation.  No new foods or medications.  This appears to be again bites likely bedbug but patient believes that it may be flea bites from her pet.  Discussed treatment for both bedbugs and flea bites.  Topical triamcinolone for symptom relief.  Patient may use antihistamine orally at home.  Avoid the causative agent whether its fleas or bedbugs by appropriate treatment.  Follow-up primary care as needed..  Patient is given ED precautions to return to the ED for any worsening or new symptoms.     ____________________________________________  FINAL CLINICAL IMPRESSION(S) / ED DIAGNOSES  Final diagnoses:  Insect bite of abdominal wall, initial encounter      NEW MEDICATIONS STARTED DURING THIS VISIT:  ED Discharge Orders          Ordered    triamcinolone cream (KENALOG) 0.1 %  4 times daily        02/16/21 1847                This chart was dictated using voice recognition software/Dragon. Despite best efforts to proofread, errors can occur which can change the meaning. Any change was purely unintentional.    Darletta Moll, PA-C 02/16/21 1847    Naaman Plummer,  MD 02/16/21 [phone]

## 2021-02-21 DIAGNOSIS — E782 Mixed hyperlipidemia: Secondary | ICD-10-CM | POA: Diagnosis not present

## 2021-02-21 DIAGNOSIS — L500 Allergic urticaria: Secondary | ICD-10-CM | POA: Diagnosis not present

## 2021-02-21 DIAGNOSIS — I2510 Atherosclerotic heart disease of native coronary artery without angina pectoris: Secondary | ICD-10-CM | POA: Diagnosis not present

## 2021-02-21 DIAGNOSIS — I10 Essential (primary) hypertension: Secondary | ICD-10-CM | POA: Diagnosis not present

## 2021-02-28 ENCOUNTER — Other Ambulatory Visit: Payer: Self-pay | Admitting: Internal Medicine

## 2021-02-28 DIAGNOSIS — I10 Essential (primary) hypertension: Secondary | ICD-10-CM | POA: Diagnosis not present

## 2021-02-28 DIAGNOSIS — R079 Chest pain, unspecified: Secondary | ICD-10-CM | POA: Diagnosis not present

## 2021-02-28 DIAGNOSIS — K219 Gastro-esophageal reflux disease without esophagitis: Secondary | ICD-10-CM | POA: Diagnosis not present

## 2021-02-28 DIAGNOSIS — I340 Nonrheumatic mitral (valve) insufficiency: Secondary | ICD-10-CM | POA: Diagnosis not present

## 2021-02-28 DIAGNOSIS — R0602 Shortness of breath: Secondary | ICD-10-CM | POA: Diagnosis not present

## 2021-02-28 DIAGNOSIS — G4733 Obstructive sleep apnea (adult) (pediatric): Secondary | ICD-10-CM | POA: Diagnosis not present

## 2021-02-28 DIAGNOSIS — I2510 Atherosclerotic heart disease of native coronary artery without angina pectoris: Secondary | ICD-10-CM | POA: Diagnosis not present

## 2021-02-28 DIAGNOSIS — Z1231 Encounter for screening mammogram for malignant neoplasm of breast: Secondary | ICD-10-CM

## 2021-02-28 DIAGNOSIS — E782 Mixed hyperlipidemia: Secondary | ICD-10-CM | POA: Diagnosis not present

## 2021-02-28 DIAGNOSIS — I739 Peripheral vascular disease, unspecified: Secondary | ICD-10-CM | POA: Diagnosis not present

## 2021-02-28 DIAGNOSIS — J449 Chronic obstructive pulmonary disease, unspecified: Secondary | ICD-10-CM | POA: Diagnosis not present

## 2021-03-31 DIAGNOSIS — G4733 Obstructive sleep apnea (adult) (pediatric): Secondary | ICD-10-CM | POA: Diagnosis not present

## 2021-03-31 DIAGNOSIS — E782 Mixed hyperlipidemia: Secondary | ICD-10-CM | POA: Diagnosis not present

## 2021-03-31 DIAGNOSIS — I10 Essential (primary) hypertension: Secondary | ICD-10-CM | POA: Diagnosis not present

## 2021-03-31 DIAGNOSIS — K219 Gastro-esophageal reflux disease without esophagitis: Secondary | ICD-10-CM | POA: Diagnosis not present

## 2021-03-31 DIAGNOSIS — F17200 Nicotine dependence, unspecified, uncomplicated: Secondary | ICD-10-CM | POA: Diagnosis not present

## 2021-03-31 DIAGNOSIS — I2510 Atherosclerotic heart disease of native coronary artery without angina pectoris: Secondary | ICD-10-CM | POA: Diagnosis not present

## 2021-03-31 DIAGNOSIS — I340 Nonrheumatic mitral (valve) insufficiency: Secondary | ICD-10-CM | POA: Diagnosis not present

## 2021-03-31 DIAGNOSIS — I739 Peripheral vascular disease, unspecified: Secondary | ICD-10-CM | POA: Diagnosis not present

## 2021-04-11 ENCOUNTER — Ambulatory Visit
Admission: RE | Admit: 2021-04-11 | Discharge: 2021-04-11 | Disposition: A | Discharge status: Home / Self Care | Payer: Medicare Other | Source: Ambulatory Visit | Attending: Internal Medicine | Admitting: Internal Medicine

## 2021-04-11 ENCOUNTER — Other Ambulatory Visit: Payer: Self-pay

## 2021-04-11 DIAGNOSIS — Z1231 Encounter for screening mammogram for malignant neoplasm of breast: Secondary | ICD-10-CM | POA: Diagnosis not present

## 2021-04-20 DIAGNOSIS — Z859 Personal history of malignant neoplasm, unspecified: Secondary | ICD-10-CM | POA: Diagnosis not present

## 2021-04-20 DIAGNOSIS — L570 Actinic keratosis: Secondary | ICD-10-CM | POA: Diagnosis not present

## 2021-04-25 DIAGNOSIS — I340 Nonrheumatic mitral (valve) insufficiency: Secondary | ICD-10-CM | POA: Diagnosis not present

## 2021-05-05 DIAGNOSIS — C9111 Chronic lymphocytic leukemia of B-cell type in remission: Secondary | ICD-10-CM | POA: Diagnosis not present

## 2021-05-05 DIAGNOSIS — I340 Nonrheumatic mitral (valve) insufficiency: Secondary | ICD-10-CM | POA: Diagnosis not present

## 2021-05-05 DIAGNOSIS — I10 Essential (primary) hypertension: Secondary | ICD-10-CM | POA: Diagnosis not present

## 2021-05-05 DIAGNOSIS — K219 Gastro-esophageal reflux disease without esophagitis: Secondary | ICD-10-CM | POA: Diagnosis not present

## 2021-05-05 DIAGNOSIS — I739 Peripheral vascular disease, unspecified: Secondary | ICD-10-CM | POA: Diagnosis not present

## 2021-05-05 DIAGNOSIS — F17200 Nicotine dependence, unspecified, uncomplicated: Secondary | ICD-10-CM | POA: Diagnosis not present

## 2021-05-05 DIAGNOSIS — I2510 Atherosclerotic heart disease of native coronary artery without angina pectoris: Secondary | ICD-10-CM | POA: Diagnosis not present

## 2021-05-05 DIAGNOSIS — G4733 Obstructive sleep apnea (adult) (pediatric): Secondary | ICD-10-CM | POA: Diagnosis not present

## 2021-05-05 DIAGNOSIS — E782 Mixed hyperlipidemia: Secondary | ICD-10-CM | POA: Diagnosis not present

## 2021-05-05 DIAGNOSIS — E538 Deficiency of other specified B group vitamins: Secondary | ICD-10-CM | POA: Diagnosis not present

## 2021-05-05 DIAGNOSIS — I351 Nonrheumatic aortic (valve) insufficiency: Secondary | ICD-10-CM | POA: Diagnosis not present

## 2021-05-05 DIAGNOSIS — E559 Vitamin D deficiency, unspecified: Secondary | ICD-10-CM | POA: Diagnosis not present

## 2021-05-05 DIAGNOSIS — Z23 Encounter for immunization: Secondary | ICD-10-CM | POA: Diagnosis not present

## 2021-05-05 DIAGNOSIS — I208 Other forms of angina pectoris: Secondary | ICD-10-CM | POA: Diagnosis not present

## 2021-05-05 DIAGNOSIS — R7302 Impaired glucose tolerance (oral): Secondary | ICD-10-CM | POA: Diagnosis not present

## 2021-05-05 DIAGNOSIS — J449 Chronic obstructive pulmonary disease, unspecified: Secondary | ICD-10-CM | POA: Diagnosis not present

## 2021-06-07 ENCOUNTER — Inpatient Hospital Stay: Payer: Medicare Other | Admitting: Oncology

## 2021-06-07 ENCOUNTER — Inpatient Hospital Stay: Payer: Medicare Other

## 2021-06-07 ENCOUNTER — Telehealth: Payer: Self-pay | Admitting: Oncology

## 2021-06-07 NOTE — Telephone Encounter (Signed)
Attempted to contact pt. Unable to reach or leave VM. Per Dr. Tasia Catchings, ok to see NP. Will try to reach out to pt again to reschedule.

## 2021-06-07 NOTE — Telephone Encounter (Signed)
Pt called to cancel appt. Today. Please call to reschedule at [phone]

## 2021-06-22 ENCOUNTER — Other Ambulatory Visit: Payer: Medicare Other

## 2021-06-22 ENCOUNTER — Ambulatory Visit: Payer: Medicare Other | Admitting: Oncology

## 2021-06-26 ENCOUNTER — Other Ambulatory Visit: Payer: Self-pay | Admitting: Obstetrics and Gynecology

## 2021-08-02 DIAGNOSIS — J449 Chronic obstructive pulmonary disease, unspecified: Secondary | ICD-10-CM | POA: Diagnosis not present

## 2021-08-02 DIAGNOSIS — F17200 Nicotine dependence, unspecified, uncomplicated: Secondary | ICD-10-CM | POA: Diagnosis not present

## 2021-08-02 DIAGNOSIS — K219 Gastro-esophageal reflux disease without esophagitis: Secondary | ICD-10-CM | POA: Diagnosis not present

## 2021-08-02 DIAGNOSIS — I340 Nonrheumatic mitral (valve) insufficiency: Secondary | ICD-10-CM | POA: Diagnosis not present

## 2021-08-02 DIAGNOSIS — Z9861 Coronary angioplasty status: Secondary | ICD-10-CM | POA: Diagnosis not present

## 2021-08-02 DIAGNOSIS — E782 Mixed hyperlipidemia: Secondary | ICD-10-CM | POA: Diagnosis not present

## 2021-08-02 DIAGNOSIS — I739 Peripheral vascular disease, unspecified: Secondary | ICD-10-CM | POA: Diagnosis not present

## 2021-08-02 DIAGNOSIS — I2510 Atherosclerotic heart disease of native coronary artery without angina pectoris: Secondary | ICD-10-CM | POA: Diagnosis not present

## 2021-08-02 DIAGNOSIS — I10 Essential (primary) hypertension: Secondary | ICD-10-CM | POA: Diagnosis not present

## 2021-08-02 DIAGNOSIS — G4733 Obstructive sleep apnea (adult) (pediatric): Secondary | ICD-10-CM | POA: Diagnosis not present

## 2021-08-02 DIAGNOSIS — I351 Nonrheumatic aortic (valve) insufficiency: Secondary | ICD-10-CM | POA: Diagnosis not present

## 2021-08-02 DIAGNOSIS — R079 Chest pain, unspecified: Secondary | ICD-10-CM | POA: Diagnosis not present

## 2021-08-05 DIAGNOSIS — G4733 Obstructive sleep apnea (adult) (pediatric): Secondary | ICD-10-CM | POA: Diagnosis not present

## 2021-08-05 DIAGNOSIS — E782 Mixed hyperlipidemia: Secondary | ICD-10-CM | POA: Diagnosis not present

## 2021-08-05 DIAGNOSIS — I2510 Atherosclerotic heart disease of native coronary artery without angina pectoris: Secondary | ICD-10-CM | POA: Diagnosis not present

## 2021-08-05 DIAGNOSIS — I10 Essential (primary) hypertension: Secondary | ICD-10-CM | POA: Diagnosis not present

## 2021-08-05 DIAGNOSIS — F32A Depression, unspecified: Secondary | ICD-10-CM | POA: Diagnosis not present

## 2021-08-05 DIAGNOSIS — R7302 Impaired glucose tolerance (oral): Secondary | ICD-10-CM | POA: Diagnosis not present

## 2021-08-17 ENCOUNTER — Other Ambulatory Visit: Payer: Self-pay | Admitting: *Deleted

## 2021-08-17 DIAGNOSIS — C9110 Chronic lymphocytic leukemia of B-cell type not having achieved remission: Secondary | ICD-10-CM

## 2021-08-23 ENCOUNTER — Other Ambulatory Visit: Payer: Self-pay

## 2021-08-23 ENCOUNTER — Inpatient Hospital Stay: Payer: Medicare Other

## 2021-08-23 ENCOUNTER — Inpatient Hospital Stay: Payer: Medicare Other | Attending: Oncology | Admitting: Oncology

## 2021-08-23 ENCOUNTER — Encounter: Payer: Self-pay | Admitting: Oncology

## 2021-08-23 VITALS — BP 176/100 | HR 55 | Temp 97.0°F | Wt 156.0 lb

## 2021-08-23 DIAGNOSIS — C9110 Chronic lymphocytic leukemia of B-cell type not having achieved remission: Secondary | ICD-10-CM

## 2021-08-23 DIAGNOSIS — E039 Hypothyroidism, unspecified: Secondary | ICD-10-CM | POA: Diagnosis not present

## 2021-08-23 DIAGNOSIS — Z79899 Other long term (current) drug therapy: Secondary | ICD-10-CM | POA: Insufficient documentation

## 2021-08-23 DIAGNOSIS — Z720 Tobacco use: Secondary | ICD-10-CM

## 2021-08-23 LAB — LACTATE DEHYDROGENASE: LDH: 119 U/L (ref 98–192)

## 2021-08-23 LAB — CBC WITH DIFFERENTIAL/PLATELET
Abs Immature Granulocytes: 0.05 10*3/uL (ref 0.00–0.07)
Basophils Absolute: 0.1 10*3/uL (ref 0.0–0.1)
Basophils Relative: 0 %
Eosinophils Absolute: 0.1 10*3/uL (ref 0.0–0.5)
Eosinophils Relative: 0 %
HCT: 40.6 % (ref 36.0–46.0)
Hemoglobin: 12.9 g/dL (ref 12.0–15.0)
Immature Granulocytes: 0 %
Lymphocytes Relative: 86 %
Lymphs Abs: 33.7 10*3/uL — ABNORMAL HIGH (ref 0.7–4.0)
MCH: 30.4 pg (ref 26.0–34.0)
MCHC: 31.8 g/dL (ref 30.0–36.0)
MCV: 95.5 fL (ref 80.0–100.0)
Monocytes Absolute: 1.8 10*3/uL — ABNORMAL HIGH (ref 0.1–1.0)
Monocytes Relative: 5 %
Neutro Abs: 3.3 10*3/uL (ref 1.7–7.7)
Neutrophils Relative %: 9 %
Platelets: 203 10*3/uL (ref 150–400)
RBC: 4.25 MIL/uL (ref 3.87–5.11)
RDW: 13.8 % (ref 11.5–15.5)
Smear Review: ADEQUATE
WBC: 39.2 10*3/uL — ABNORMAL HIGH (ref 4.0–10.5)
nRBC: 0 % (ref 0.0–0.2)

## 2021-08-23 LAB — COMPREHENSIVE METABOLIC PANEL
ALT: 14 U/L (ref 0–44)
AST: 15 U/L (ref 15–41)
Albumin: 4.3 g/dL (ref 3.5–5.0)
Alkaline Phosphatase: 71 U/L (ref 38–126)
Anion gap: 6 (ref 5–15)
BUN: 13 mg/dL (ref 8–23)
CO2: 28 mmol/L (ref 22–32)
Calcium: 9.9 mg/dL (ref 8.9–10.3)
Chloride: 103 mmol/L (ref 98–111)
Creatinine, Ser: 1.19 mg/dL — ABNORMAL HIGH (ref 0.44–1.00)
GFR, Estimated: 47 mL/min — ABNORMAL LOW (ref >60)
Glucose, Bld: 97 mg/dL (ref 70–99)
Potassium: 4.1 mmol/L (ref 3.5–5.1)
Sodium: 137 mmol/L (ref 135–145)
Total Bilirubin: 0.4 mg/dL (ref 0.3–1.2)
Total Protein: 7 g/dL (ref 6.5–8.1)

## 2021-08-23 NOTE — Progress Notes (Signed)
Hematology/Oncology Progress note [phone] [phone]      Patient Care Team: Perrin Maltese, MD as PCP - General (Internal Medicine) Earlie Server, MD as Consulting Physician (Hematology and Oncology)  REFERRING PROVIDER:  Perrin Maltese, MD  REASON FOR VISIT Follow up for treatment of CLL  HISTORY OF PRESENTING ILLNESS:  Karen [name] is a  76 y.o.  female with PMH listed below who was referred to me for evaluation of persistent leukocytosis/lymphocytosis. Reviewed patient's lab work done at PCPs office. 05/01/2017 WBC 11.7 absolute lymphocytes 7.4. Hemoglobin 13 MCV 95 platelet counts 225,000. 06/22/2017 WBC is 17.5, absolute lymphocyte 11.7 hemoglobin 11.9, MCV 92, platelet count 240,000. Patient reports feeling tired at baseline. Otherwise denies any night sweats, fever or chills. She reports she got sick around holiday. But today she feels better. She is active smoker, currently 1 or 2 cigarettes a day  # 10/14/2015 chest without contrast Images were independently reviewed by me Small pulmonary nodules are stable over the past year.  Considered benign.  Several lymph nodes are borderline enlarged, including the lower right paratracheal lymph node and a portacaval lymph node. 08/27/2017 US abdomen complete, no hepato-splenomegaly. 08/07/2020, patient was diagnosed with COVID-19 was hospitalized.   INTERVAL HISTORY Karen [name] is a 76 y.o. female who has above history reviewed by me today present for follow-up visit for CLL Blood pressure is slightly high in the clinic.  Patient tells me that recently she is undergoing a lot of stress in her life.  Her boyfriend passed away this past winter. Her appetite is very fair.  Weight has been stable.  No unintentional weight loss, fever, night sweats. No shortness of breath or cough.   Review of Systems  Constitutional:  Positive for fatigue. Negative for appetite change, chills, fever and unexpected weight  change.  HENT:   Negative for hearing loss and voice change.   Eyes:  Negative for eye problems.  Respiratory:  Negative for chest tightness and cough.   Cardiovascular:  Negative for chest pain.  Gastrointestinal:  Negative for abdominal distention, abdominal pain and blood in stool.  Endocrine: Negative for hot flashes.  Genitourinary:  Negative for difficulty urinating and frequency.   Musculoskeletal:  Negative for arthralgias.  Skin:  Negative for itching and rash.  Neurological:  Negative for extremity weakness.  Hematological:  Negative for adenopathy.  Psychiatric/Behavioral:  Negative for confusion.     MEDICAL HISTORY:  Past Medical History:  Diagnosis Date   Anxiety    Artery occlusion    Asthma    Back pain    Bronchitis    Cancer (HCC)    Skin Cancer   CLL (chronic lymphocytic leukemia) (HCC)    Coronary artery disease    Depression    Dyspnea    GERD (gastroesophageal reflux disease)    History of kidney stones    Hypercholesteremia    Hyperlipemia    Hypertension    Hypothyroidism    Myocardial infarction ([place]) 1999   Neuropathy    Restless leg syndrome    Sleep apnea    use C-PAP   Wears dentures    full upper, partial lower.  doesn't currently wear    SURGICAL HISTORY: Past Surgical History:  Procedure Laterality Date   COLONOSCOPY WITH PROPOFOL N/A 11/20/2016   Procedure: COLONOSCOPY WITH PROPOFOL;  Surgeon: Lucilla Lame, MD;  Location: [place];  Service: Endoscopy;  Laterality: N/A;  sleep apnea   CORONARY ANGIOPLASTY WITH STENT PLACEMENT  2008  DILATION AND CURETTAGE OF UTERUS     EYE SURGERY Bilateral    Cataract Extraction with IOL   HAND SURGERY Left 12/2017   upper hand fracture repair   LEFT HEART CATH AND CORONARY ANGIOGRAPHY N/A 06/29/2017   Procedure: LEFT HEART CATH AND CORONARY ANGIOGRAPHY;  Surgeon: Dionisio David, MD;  Location: [place] [facility];  Service: Cardiovascular;  Laterality: N/A;   LEFT HEART CATH AND  CORONARY ANGIOGRAPHY Left 12/02/2018   Procedure: LEFT HEART CATH AND CORONARY ANGIOGRAPHY;  Surgeon: Dionisio David, MD;  Location: [place] [facility];  Service: Cardiovascular;  Laterality: Left;   OPEN REDUCTION INTERNAL FIXATION (ORIF) DISTAL RADIAL FRACTURE Right 08/12/2018   Procedure: OPEN REDUCTION INTERNAL FIXATION (ORIF) DISTAL RADIAL FRACTURE;  Surgeon: [name], MD;  Location: [facility];  Service: Orthopedics;  Laterality: Right;   POLYPECTOMY  11/20/2016   Procedure: POLYPECTOMY;  Surgeon: Lucilla Lame, MD;  Location: [place];  Service: Endoscopy;;   TUBAL LIGATION     VAGINAL HYSTERECTOMY     VULVECTOMY Right 01/31/2016   Procedure: WIDE EXCISION VULVECTOMY-RIGHT LABIA;  Surgeon: Brayton Mars, MD;  Location: [facility];  Service: Gynecology;  Laterality: Right;    SOCIAL HISTORY: Social History   Socioeconomic History   Marital status: Single    Spouse name: Not on file   Number of children: 5   Years of education: Not on file   Highest education level: Not on file  Occupational History   Occupation: part time job    Comment: Engineer, building services  Tobacco Use   Smoking status: Every Day    Packs/day: 1.00    Years: 35.00    Pack years: 35.00    Types: Cigarettes   Smokeless tobacco: Current    Types: Snuff  Vaping Use   Vaping Use: Never used  Substance and Sexual Activity   Alcohol use: No    Alcohol/week: 0.0 standard drinks   Drug use: No   Sexual activity: Not Currently    Partners: Male    Birth control/protection: Surgical  Other Topics Concern   Not on file  Social History Narrative   Not on file   Social Determinants of Health   Financial Resource Strain: Not on file  Food Insecurity: Not on file  Transportation Needs: Not on file  Physical Activity: Not on file  Stress: Not on file  Social Connections: Not on file  Intimate Partner Violence: Not on file    FAMILY HISTORY: Family History  Problem Relation Age of Onset    Heart disease Father    Stroke Mother    Hypertension Mother    Lung cancer Brother    Heart attack Brother    Congestive Heart Failure Sister    Cervical cancer Sister    Cancer Neg Hx    Diabetes Neg Hx    Breast cancer Neg Hx     ALLERGIES:  is allergic to requip [ropinirole hcl], celebrex [celecoxib], meloxicam, and nitrofurantoin.  MEDICATIONS:  Current Outpatient Medications  Medication Sig Dispense Refill   acetaminophen (TYLENOL) 500 MG tablet Take 500-1,000 mg by mouth every 6 (six) hours as needed for moderate pain or fever.     albuterol (VENTOLIN HFA) 108 (90 Base) MCG/ACT inhaler Inhale 1-2 puffs into the lungs every 6 (six) hours as needed for wheezing or shortness of breath.     amLODipine (NORVASC) 5 MG tablet Take 5 mg by mouth daily. 1 Tablet(s) By Mouth Daily  aspirin EC 81 MG tablet Take 1 tablet (81 mg total) by mouth daily. 30 tablet    budesonide-formoterol (SYMBICORT) 160-4.5 MCG/ACT inhaler Inhale 2 puffs into the lungs 2 (two) times daily as needed (shortness of breath).     buPROPion (WELLBUTRIN XL) 150 MG 24 hr tablet Take 150 mg by mouth at bedtime.     clopidogrel (PLAVIX) 75 MG tablet Take 75 mg by mouth daily.      dexlansoprazole (DEXILANT) 60 MG capsule Take 60 mg by mouth daily.      fluticasone (FLONASE) 50 MCG/ACT nasal spray Place 2 sprays into both nostrils daily as needed for allergies or rhinitis.     gabapentin (NEURONTIN) 400 MG capsule Take 400 mg by mouth 3 (three) times daily.     isosorbide mononitrate (IMDUR) 60 MG 24 hr tablet Take 60 mg by mouth daily.     lisinopril (ZESTRIL) 40 MG tablet Hold until followup with outpatient provider due to acute kidney injury.     loratadine (CLARITIN) 10 MG tablet Take 10 mg by mouth daily.     meclizine (ANTIVERT) 25 MG tablet Take 25 mg by mouth 2 (two) times daily as needed.     metoprolol succinate (TOPROL-XL) 25 MG 24 hr tablet Take 25 mg by mouth daily.      ondansetron (ZOFRAN-ODT) 4 MG  disintegrating tablet Take 4 mg by mouth every 8 (eight) hours as needed.     PREMARIN vaginal cream USE 1 GRAM INTRAVAGINALLY TWICE PER WEEK 30 g 3   Tetrahydrozoline HCl (VISINE OP) Place 1 drop into both eyes daily as needed (dry eyes).     triamcinolone cream (KENALOG) 0.1 % Apply 1 application topically 4 (four) times daily. 30 g 0   atorvastatin (LIPITOR) 40 MG tablet Take 1 tablet (40 mg total) by mouth daily. 30 tablet 2   chlorthalidone (HYGROTON) 25 MG tablet Hold until followup with outpatient provider due to acute kidney injury. (Patient not taking: Reported on 09/14/2020)     REPATHA 140 MG/ML SOSY Inject 140 mg into the skin every 14 (fourteen) days. (Patient not taking: Reported on 09/14/2020)     No current facility-administered medications for this visit.     PHYSICAL EXAMINATION: ECOG PERFORMANCE STATUS: 1 - Symptomatic but completely ambulatory Vitals:   08/23/21 1337  BP: (!) 176/100  Pulse: (!) 55  Temp: (!) 97 F (36.1 C)   Filed Weights   08/23/21 1337  Weight: 156 lb (70.8 kg)    Physical Exam Constitutional:      General: She is not in acute distress.    Appearance: She is not diaphoretic.  HENT:     Head: Normocephalic and atraumatic.     Nose: Nose normal.     Mouth/Throat:     Pharynx: No oropharyngeal exudate.  Eyes:     General: No scleral icterus.       Left eye: No discharge.     Conjunctiva/sclera: Conjunctivae normal.     Pupils: Pupils are equal, round, and reactive to light.  Neck:     Vascular: No JVD.  Cardiovascular:     Rate and Rhythm: Normal rate and regular rhythm.     Heart sounds: No murmur heard. Pulmonary:     Effort: Pulmonary effort is normal. No respiratory distress.     Breath sounds: No rales.  Chest:     Chest wall: No tenderness.  Abdominal:     General: Bowel sounds are normal. There is no distension.  Palpations: Abdomen is soft.     Tenderness: There is no abdominal tenderness.  Musculoskeletal:         General: Normal range of motion.     Cervical back: Normal range of motion and neck supple.  Lymphadenopathy:     Cervical: No cervical adenopathy.  Skin:    General: Skin is warm and dry.     Findings: No erythema.  Neurological:     Mental Status: She is alert and oriented to person, place, and time.     Cranial Nerves: No cranial nerve deficit.     Motor: No abnormal muscle tone.     Coordination: Coordination normal.  Psychiatric:        Mood and Affect: Affect normal.        Judgment: Judgment normal.     LABORATORY DATA:  I have reviewed the data as listed Lab Results  Component Value Date   WBC 39.2 (H) 08/23/2021   HGB 12.9 08/23/2021   HCT 40.6 08/23/2021   MCV 95.5 08/23/2021   PLT 203 08/23/2021   Recent Labs    09/14/20 0959 12/14/20 1306 08/23/21 1309  NA 140 141 137  K 3.9 4.1 4.1  CL 103 103 103  CO2 28 29 28   GLUCOSE 96 98 97  BUN 11 15 13   CREATININE 1.20* 1.28* 1.19*  CALCIUM 9.4 9.6 9.9  GFRNONAA 47* 44* 47*  PROT 7.0 7.2 7.0  ALBUMIN 4.1 4.3 4.3  AST 13* 16 15  ALT 12 14 14   ALKPHOS 61 65 71  BILITOT 0.5 0.6 0.4     Serum Osmolarity 266, urine Osm 374, urine sodium 21.    ASSESSMENT & PLAN:  1. CLL (chronic lymphocytic leukemia) ([place])   2. Tobacco abuse     # CLL, Rai stage 0 Leukocytosis has worsened.  39.2. Patient has no constitutional symptoms.  On physical examination, patient has no lymphadenopathy Continue watchful waiting.  # Tobacco abuse,  More than 30 pack year smoking history,.  I have referred her to establish with lung cancer screening program and she has not had a lung cancer screening CT done yet. Recommend lung cancer screening CT   All questions were answered. The patient knows to call the clinic with any problems questions or concerns.   Return of visit 6 months Earlie Server, MD, PhD Hematology Oncology  08/23/2021

## 2021-08-23 NOTE — Progress Notes (Signed)
Patient here for follow up. Denies any concern.

## 2021-08-26 DIAGNOSIS — I2510 Atherosclerotic heart disease of native coronary artery without angina pectoris: Secondary | ICD-10-CM | POA: Diagnosis not present

## 2021-08-26 DIAGNOSIS — I10 Essential (primary) hypertension: Secondary | ICD-10-CM | POA: Diagnosis not present

## 2021-08-26 DIAGNOSIS — G4733 Obstructive sleep apnea (adult) (pediatric): Secondary | ICD-10-CM | POA: Diagnosis not present

## 2021-08-26 DIAGNOSIS — E782 Mixed hyperlipidemia: Secondary | ICD-10-CM | POA: Diagnosis not present

## 2021-08-26 DIAGNOSIS — J449 Chronic obstructive pulmonary disease, unspecified: Secondary | ICD-10-CM | POA: Diagnosis not present

## 2021-09-22 DIAGNOSIS — K5790 Diverticulosis of intestine, part unspecified, without perforation or abscess without bleeding: Secondary | ICD-10-CM | POA: Diagnosis not present

## 2021-09-22 DIAGNOSIS — Z7902 Long term (current) use of antithrombotics/antiplatelets: Secondary | ICD-10-CM | POA: Diagnosis not present

## 2021-09-22 DIAGNOSIS — Z720 Tobacco use: Secondary | ICD-10-CM | POA: Diagnosis not present

## 2021-09-22 DIAGNOSIS — Z86010 Personal history of colonic polyps: Secondary | ICD-10-CM | POA: Diagnosis not present

## 2021-09-22 DIAGNOSIS — K219 Gastro-esophageal reflux disease without esophagitis: Secondary | ICD-10-CM | POA: Diagnosis not present

## 2021-09-27 DIAGNOSIS — N390 Urinary tract infection, site not specified: Secondary | ICD-10-CM | POA: Diagnosis not present

## 2021-09-27 DIAGNOSIS — R300 Dysuria: Secondary | ICD-10-CM | POA: Diagnosis not present

## 2021-10-12 ENCOUNTER — Other Ambulatory Visit: Payer: Self-pay

## 2021-10-12 DIAGNOSIS — F17210 Nicotine dependence, cigarettes, uncomplicated: Secondary | ICD-10-CM

## 2021-10-12 DIAGNOSIS — Z87891 Personal history of nicotine dependence: Secondary | ICD-10-CM

## 2021-10-25 ENCOUNTER — Encounter: Payer: Medicare Other | Admitting: Acute Care

## 2021-10-27 ENCOUNTER — Encounter: Payer: Self-pay | Admitting: Acute Care

## 2021-10-27 ENCOUNTER — Encounter (INDEPENDENT_AMBULATORY_CARE_PROVIDER_SITE_OTHER): Payer: Medicare Other | Admitting: Acute Care

## 2021-10-27 DIAGNOSIS — F17210 Nicotine dependence, cigarettes, uncomplicated: Secondary | ICD-10-CM

## 2021-10-27 NOTE — Patient Instructions (Signed)
Thank you for participating in the [facility]. It was our pleasure to meet you today. We will call you with the results of your scan within the next few days. Your scan will be assigned a Lung RADS category score by the physicians reading the scans.  This Lung RADS score determines follow up scanning.  See below for description of categories, and follow up screening recommendations. We will be in touch to schedule your follow up screening annually or based on recommendations of our providers. We will fax a copy of your scan results to your Primary Care Physician, or the physician who referred you to the program, to ensure they have the results. Please call the office if you have any questions or concerns regarding your scanning experience or results.  Our office number is [phone]. Please speak with Denise Phelps, RN. , or  Denise Buckner RN, They are  our Lung Cancer Screening RN.'s If They are unavailable when you call, Please leave a message on the voice mail. We will return your call at our earliest convenience.This voice mail is monitored several times a day.  Remember, if your scan is normal, we will scan you annually as long as you continue to meet the criteria for the program. (Age 55-77, Current smoker or smoker who has quit within the last 15 years). If you are a smoker, remember, quitting is the single most powerful action that you can take to decrease your risk of lung cancer and other pulmonary, breathing related problems. We know quitting is hard, and we are here to help.  Please let us know if there is anything we can do to help you meet your goal of quitting. If you are a former smoker, congratulations. We are proud of you! Remain smoke free! Remember you can refer friends or family members through the number above.  We will screen them to make sure they meet criteria for the program. Thank you for helping us take better care of you by  participating in Lung Screening.  You can receive free nicotine replacement therapy ( patches, gum or mints) by calling 1-800-QUIT NOW. Please call so we can get you on the path to becoming  a non-smoker. I know it is hard, but you can do this!  Lung RADS Categories:  Lung RADS 1: no nodules or definitely non-concerning nodules.  Recommendation is for a repeat annual scan in 12 months.  Lung RADS 2:  nodules that are non-concerning in appearance and behavior with a very low likelihood of becoming an active cancer. Recommendation is for a repeat annual scan in 12 months.  Lung RADS 3: nodules that are probably non-concerning , includes nodules with a low likelihood of becoming an active cancer.  Recommendation is for a 6-month repeat screening scan. Often noted after an upper respiratory illness. We will be in touch to make sure you have no questions, and to schedule your 6-month scan.  Lung RADS 4 A: nodules with concerning findings, recommendation is most often for a follow up scan in 3 months or additional testing based on our provider's assessment of the scan. We will be in touch to make sure you have no questions and to schedule the recommended 3 month follow up scan.  Lung RADS 4 B:  indicates findings that are concerning. We will be in touch with you to schedule additional diagnostic testing based on our provider's  assessment of the scan.  Other options for assistance in smoking cessation (   As covered by your insurance benefits)  Hypnosis for smoking cessation  [facility] [employer] [phone]  Acupuncture for smoking cessation  [facility] [phone]   

## 2021-10-27 NOTE — Progress Notes (Signed)
?Virtual Visit via Telephone Note ? ?I connected with Karen [name] on 10/27/21 at 10:00 AM EDT by telephone and verified that I am speaking with the correct person using two identifiers. ? ?Location: ?Patient:  At home ?Provider:  [address], [place], [place], [address]  ?  ?I discussed the limitations, risks, security and privacy concerns of performing an evaluation and management service by telephone and the availability of in person appointments. I also discussed with the patient that there may be a patient responsible charge related to this service. The patient expressed understanding and agreed to proceed. ? ? ?Shared Decision Making Visit Lung Cancer Screening Program ?([phone]) ? ? ?Eligibility: ?Age 76 y.o. ?Pack Years Smoking History Calculation 25  pack year smoking history ?(# packs/per year x # years smoked) ?Recent History of coughing up blood  no ?Unexplained weight loss? no ?( >Than 15 pounds within the last 6 months ) ?Prior History Lung / other cancer no ?(Diagnosis within the last 5 years already requiring surveillance chest CT Scans). ?Smoking Status Current Smoker ?Former Smokers: Years since quit:  NA ? Quit Date:  NA ? ?Visit Components: ?Discussion included one or more decision making aids. yes ?Discussion included risk/benefits of screening. yes ?Discussion included potential follow up diagnostic testing for abnormal scans. yes ?Discussion included meaning and risk of over diagnosis. yes ?Discussion included meaning and risk of False Positives. yes ?Discussion included meaning of total radiation exposure. yes ? ?Counseling Included: ?Importance of adherence to annual lung cancer LDCT screening. yes ?Impact of comorbidities on ability to participate in the program. yes ?Ability and willingness to under diagnostic treatment. yes ? ?Smoking Cessation Counseling: ?Current Smokers:  ?Discussed importance of smoking cessation. yes ?Information about tobacco cessation classes and  interventions provided to patient. yes ?Patient provided with "ticket" for LDCT Scan. yes ?Symptomatic Patient. no ? Counseling NA ?Diagnosis Code: Tobacco Use Z72.0 ?Asymptomatic Patient yes ? Counseling (Intermediate counseling: > three minutes counseling) D3220 ?Former Smokers:  ?Discussed the importance of maintaining cigarette abstinence. yes ?Diagnosis Code: Personal History of Nicotine Dependence. U54.270 ?Information about tobacco cessation classes and interventions provided to patient. Yes ?Patient provided with "ticket" for LDCT Scan. yes ?Written Order for Lung Cancer Screening with LDCT placed in Epic. Yes ?(CT Chest Lung Cancer Screening Low Dose W/O CM) [id number] ?Z12.2-Screening of respiratory organs ?Z87.891-Personal history of nicotine dependence ? ?I have spent 25 minutes of face to face/ virtual visit   time with  [name] discussing the risks and benefits of lung cancer screening. We viewed / discussed a power point together that explained in detail the above noted topics. We paused at intervals to allow for questions to be asked and answered to ensure understanding.We discussed that the single most powerful action that she can take to decrease her risk of developing lung cancer is to quit smoking. We discussed whether or not she is ready to commit to setting a quit date. We discussed options for tools to aid in quitting smoking including nicotine replacement therapy, non-nicotine medications, support groups, Quit Smart classes, and behavior modification. We discussed that often times setting smaller, more achievable goals, such as eliminating 1 cigarette a day for a week and then 2 cigarettes a day for a week can be helpful in slowly decreasing the number of cigarettes smoked. This allows for a sense of accomplishment as well as providing a clinical benefit. I provided  her  with smoking cessation  information  with contact information for community resources, classes,  free nicotine replacement  therapy, and access to mobile apps, text messaging, and on-line smoking cessation help. I have also provided  her  the office contact information in the event she needs to contact me, or the screening staff. We discussed the time and location of the scan, and that either Doroteo Glassman RN, Joella Prince, RN  or I will call / send a letter with the results within 24-72 hours of receiving them. The patient verbalized understanding of all of  the above and had no further questions upon leaving the office. They have my contact information in the event they have any further questions. ? ?I spent 3 minutes counseling on smoking cessation and the health risks of continued tobacco abuse. ? ?I explained to the patient that there has been a high incidence of coronary artery disease noted on these exams. I explained that this is a non-gated exam therefore degree or severity cannot be determined. This patient is on statin therapy. I have asked the patient to follow-up with their PCP regarding any incidental finding of coronary artery disease and management with diet or medication as their PCP  feels is clinically indicated. The patient verbalized understanding of the above and had no further questions upon completion of the visit. ? ?  ? ? ?Magdalen Spatz, NP ?10/27/2021 ? ? ? ? ? ? ?

## 2021-10-31 ENCOUNTER — Ambulatory Visit: Payer: Medicare Other

## 2021-11-02 DIAGNOSIS — E782 Mixed hyperlipidemia: Secondary | ICD-10-CM | POA: Diagnosis not present

## 2021-11-02 DIAGNOSIS — R079 Chest pain, unspecified: Secondary | ICD-10-CM | POA: Diagnosis not present

## 2021-11-02 DIAGNOSIS — G4733 Obstructive sleep apnea (adult) (pediatric): Secondary | ICD-10-CM | POA: Diagnosis not present

## 2021-11-02 DIAGNOSIS — I739 Peripheral vascular disease, unspecified: Secondary | ICD-10-CM | POA: Diagnosis not present

## 2021-11-02 DIAGNOSIS — R0602 Shortness of breath: Secondary | ICD-10-CM | POA: Diagnosis not present

## 2021-11-02 DIAGNOSIS — I351 Nonrheumatic aortic (valve) insufficiency: Secondary | ICD-10-CM | POA: Diagnosis not present

## 2021-11-02 DIAGNOSIS — F17210 Nicotine dependence, cigarettes, uncomplicated: Secondary | ICD-10-CM | POA: Diagnosis not present

## 2021-11-02 DIAGNOSIS — I10 Essential (primary) hypertension: Secondary | ICD-10-CM | POA: Diagnosis not present

## 2021-11-02 DIAGNOSIS — K219 Gastro-esophageal reflux disease without esophagitis: Secondary | ICD-10-CM | POA: Diagnosis not present

## 2021-11-02 DIAGNOSIS — R002 Palpitations: Secondary | ICD-10-CM | POA: Diagnosis not present

## 2021-11-02 DIAGNOSIS — I340 Nonrheumatic mitral (valve) insufficiency: Secondary | ICD-10-CM | POA: Diagnosis not present

## 2021-11-02 DIAGNOSIS — I2510 Atherosclerotic heart disease of native coronary artery without angina pectoris: Secondary | ICD-10-CM | POA: Diagnosis not present

## 2021-11-07 DIAGNOSIS — I2510 Atherosclerotic heart disease of native coronary artery without angina pectoris: Secondary | ICD-10-CM | POA: Diagnosis not present

## 2021-11-07 DIAGNOSIS — I10 Essential (primary) hypertension: Secondary | ICD-10-CM | POA: Diagnosis not present

## 2021-11-07 DIAGNOSIS — I351 Nonrheumatic aortic (valve) insufficiency: Secondary | ICD-10-CM | POA: Diagnosis not present

## 2021-11-07 DIAGNOSIS — R002 Palpitations: Secondary | ICD-10-CM | POA: Diagnosis not present

## 2021-11-07 DIAGNOSIS — E782 Mixed hyperlipidemia: Secondary | ICD-10-CM | POA: Diagnosis not present

## 2021-11-07 DIAGNOSIS — R0602 Shortness of breath: Secondary | ICD-10-CM | POA: Diagnosis not present

## 2021-11-07 DIAGNOSIS — I340 Nonrheumatic mitral (valve) insufficiency: Secondary | ICD-10-CM | POA: Diagnosis not present

## 2021-11-07 DIAGNOSIS — F17210 Nicotine dependence, cigarettes, uncomplicated: Secondary | ICD-10-CM | POA: Diagnosis not present

## 2021-11-07 DIAGNOSIS — R079 Chest pain, unspecified: Secondary | ICD-10-CM | POA: Diagnosis not present

## 2021-11-08 DIAGNOSIS — C9111 Chronic lymphocytic leukemia of B-cell type in remission: Secondary | ICD-10-CM | POA: Diagnosis not present

## 2021-11-08 DIAGNOSIS — I10 Essential (primary) hypertension: Secondary | ICD-10-CM | POA: Diagnosis not present

## 2021-11-08 DIAGNOSIS — R7302 Impaired glucose tolerance (oral): Secondary | ICD-10-CM | POA: Diagnosis not present

## 2021-11-08 DIAGNOSIS — E782 Mixed hyperlipidemia: Secondary | ICD-10-CM | POA: Diagnosis not present

## 2021-11-08 DIAGNOSIS — M545 Low back pain, unspecified: Secondary | ICD-10-CM | POA: Diagnosis not present

## 2021-11-09 DIAGNOSIS — H353211 Exudative age-related macular degeneration, right eye, with active choroidal neovascularization: Secondary | ICD-10-CM | POA: Diagnosis not present

## 2021-11-11 ENCOUNTER — Encounter: Payer: Self-pay | Admitting: Internal Medicine

## 2021-11-14 DIAGNOSIS — I2510 Atherosclerotic heart disease of native coronary artery without angina pectoris: Secondary | ICD-10-CM | POA: Diagnosis not present

## 2021-11-14 DIAGNOSIS — R0602 Shortness of breath: Secondary | ICD-10-CM | POA: Diagnosis not present

## 2021-11-17 DIAGNOSIS — G4733 Obstructive sleep apnea (adult) (pediatric): Secondary | ICD-10-CM | POA: Diagnosis not present

## 2021-11-17 DIAGNOSIS — I351 Nonrheumatic aortic (valve) insufficiency: Secondary | ICD-10-CM | POA: Diagnosis not present

## 2021-11-17 DIAGNOSIS — E782 Mixed hyperlipidemia: Secondary | ICD-10-CM | POA: Diagnosis not present

## 2021-11-17 DIAGNOSIS — I340 Nonrheumatic mitral (valve) insufficiency: Secondary | ICD-10-CM | POA: Diagnosis not present

## 2021-11-17 DIAGNOSIS — I10 Essential (primary) hypertension: Secondary | ICD-10-CM | POA: Diagnosis not present

## 2021-11-17 DIAGNOSIS — R079 Chest pain, unspecified: Secondary | ICD-10-CM | POA: Diagnosis not present

## 2021-11-17 DIAGNOSIS — I739 Peripheral vascular disease, unspecified: Secondary | ICD-10-CM | POA: Diagnosis not present

## 2021-11-17 DIAGNOSIS — I2510 Atherosclerotic heart disease of native coronary artery without angina pectoris: Secondary | ICD-10-CM | POA: Diagnosis not present

## 2021-11-17 DIAGNOSIS — F17200 Nicotine dependence, unspecified, uncomplicated: Secondary | ICD-10-CM | POA: Diagnosis not present

## 2021-11-17 DIAGNOSIS — K219 Gastro-esophageal reflux disease without esophagitis: Secondary | ICD-10-CM | POA: Diagnosis not present

## 2021-11-28 DIAGNOSIS — I739 Peripheral vascular disease, unspecified: Secondary | ICD-10-CM | POA: Diagnosis not present

## 2021-12-01 DIAGNOSIS — I340 Nonrheumatic mitral (valve) insufficiency: Secondary | ICD-10-CM | POA: Diagnosis not present

## 2021-12-01 DIAGNOSIS — F17200 Nicotine dependence, unspecified, uncomplicated: Secondary | ICD-10-CM | POA: Diagnosis not present

## 2021-12-01 DIAGNOSIS — I351 Nonrheumatic aortic (valve) insufficiency: Secondary | ICD-10-CM | POA: Diagnosis not present

## 2021-12-01 DIAGNOSIS — I739 Peripheral vascular disease, unspecified: Secondary | ICD-10-CM | POA: Diagnosis not present

## 2021-12-01 DIAGNOSIS — I2510 Atherosclerotic heart disease of native coronary artery without angina pectoris: Secondary | ICD-10-CM | POA: Diagnosis not present

## 2021-12-01 DIAGNOSIS — E782 Mixed hyperlipidemia: Secondary | ICD-10-CM | POA: Diagnosis not present

## 2021-12-01 DIAGNOSIS — G4733 Obstructive sleep apnea (adult) (pediatric): Secondary | ICD-10-CM | POA: Diagnosis not present

## 2021-12-01 DIAGNOSIS — I10 Essential (primary) hypertension: Secondary | ICD-10-CM | POA: Diagnosis not present

## 2021-12-01 DIAGNOSIS — K219 Gastro-esophageal reflux disease without esophagitis: Secondary | ICD-10-CM | POA: Diagnosis not present

## 2021-12-02 ENCOUNTER — Telehealth: Payer: Self-pay

## 2021-12-02 NOTE — Telephone Encounter (Signed)
Received call from [facility] at [place] stating that she will request images be put in a disc and call us back to let us know when to pick up.  ?

## 2021-12-02 NOTE — Telephone Encounter (Signed)
[facility] to request disc or recent CT images. Left message for a call back.  [phone] ?

## 2021-12-02 NOTE — Telephone Encounter (Signed)
-----   Message from Secundino Ginger sent at 12/02/2021  8:00 AM EDT ----- ?Regarding: REFERRAL- [facility] ?REFERRAL- [facility] sent to chart. Established patient with Dr Tasia Catchings.  ?Metastatic cancer found on CT4. Patient is not scheduled to come until 02/20/2022. Dr Tasia Catchings please advise. ? ?

## 2021-12-06 ENCOUNTER — Encounter: Payer: Self-pay | Admitting: Oncology

## 2021-12-06 ENCOUNTER — Inpatient Hospital Stay: Payer: Medicare Other | Attending: Oncology | Admitting: Oncology

## 2021-12-06 ENCOUNTER — Inpatient Hospital Stay: Payer: Medicare Other

## 2021-12-06 VITALS — BP 173/77 | HR 62 | Temp 97.8°F | Resp 20 | Wt 156.6 lb

## 2021-12-06 DIAGNOSIS — Z7189 Other specified counseling: Secondary | ICD-10-CM

## 2021-12-06 DIAGNOSIS — N1831 Chronic kidney disease, stage 3a: Secondary | ICD-10-CM | POA: Diagnosis not present

## 2021-12-06 DIAGNOSIS — C9110 Chronic lymphocytic leukemia of B-cell type not having achieved remission: Secondary | ICD-10-CM | POA: Diagnosis not present

## 2021-12-06 DIAGNOSIS — Z720 Tobacco use: Secondary | ICD-10-CM

## 2021-12-06 DIAGNOSIS — D631 Anemia in chronic kidney disease: Secondary | ICD-10-CM | POA: Diagnosis not present

## 2021-12-06 LAB — CBC WITH DIFFERENTIAL/PLATELET
Abs Immature Granulocytes: 0.04 10*3/uL (ref 0.00–0.07)
Basophils Absolute: 0.1 10*3/uL (ref 0.0–0.1)
Basophils Relative: 0 %
Eosinophils Absolute: 0.2 10*3/uL (ref 0.0–0.5)
Eosinophils Relative: 1 %
HCT: 36.3 % (ref 36.0–46.0)
Hemoglobin: 11.6 g/dL — ABNORMAL LOW (ref 12.0–15.0)
Immature Granulocytes: 0 %
Lymphocytes Relative: 85 %
Lymphs Abs: 32 10*3/uL — ABNORMAL HIGH (ref 0.7–4.0)
MCH: 30.7 pg (ref 26.0–34.0)
MCHC: 32 g/dL (ref 30.0–36.0)
MCV: 96 fL (ref 80.0–100.0)
Monocytes Absolute: 2.4 10*3/uL — ABNORMAL HIGH (ref 0.1–1.0)
Monocytes Relative: 6 %
Neutro Abs: 2.8 10*3/uL (ref 1.7–7.7)
Neutrophils Relative %: 8 %
Platelets: 183 10*3/uL (ref 150–400)
RBC: 3.78 MIL/uL — ABNORMAL LOW (ref 3.87–5.11)
RDW: 13.6 % (ref 11.5–15.5)
Smear Review: NORMAL
WBC: 37.6 10*3/uL — ABNORMAL HIGH (ref 4.0–10.5)
nRBC: 0 % (ref 0.0–0.2)

## 2021-12-06 LAB — COMPREHENSIVE METABOLIC PANEL
ALT: 15 U/L (ref 0–44)
AST: 17 U/L (ref 15–41)
Albumin: 4 g/dL (ref 3.5–5.0)
Alkaline Phosphatase: 69 U/L (ref 38–126)
Anion gap: 9 (ref 5–15)
BUN: 11 mg/dL (ref 8–23)
CO2: 26 mmol/L (ref 22–32)
Calcium: 9.5 mg/dL (ref 8.9–10.3)
Chloride: 102 mmol/L (ref 98–111)
Creatinine, Ser: 1.15 mg/dL — ABNORMAL HIGH (ref 0.44–1.00)
GFR, Estimated: 49 mL/min — ABNORMAL LOW (ref >60)
Glucose, Bld: 117 mg/dL — ABNORMAL HIGH (ref 70–99)
Potassium: 3.5 mmol/L (ref 3.5–5.1)
Sodium: 137 mmol/L (ref 135–145)
Total Bilirubin: 0.3 mg/dL (ref 0.3–1.2)
Total Protein: 6.7 g/dL (ref 6.5–8.1)

## 2021-12-06 LAB — URIC ACID: Uric Acid, Serum: 5.3 mg/dL (ref 2.5–7.1)

## 2021-12-06 LAB — LACTATE DEHYDROGENASE: LDH: 126 U/L (ref 98–192)

## 2021-12-06 NOTE — Progress Notes (Signed)
?Hematology/Oncology Progress note ?[phone] [phone] ?  ? ?  ? ? ? ?Patient Care Team: ?Perrin Maltese, MD as PCP - General (Internal Medicine) ?Earlie Server, MD as Consulting Physician (Hematology and Oncology) ? ?REFERRING PROVIDER: ? ?Perrin Maltese, MD ? ?REASON FOR VISIT ?Follow up for treatment of CLL ? ?HISTORY OF PRESENTING ILLNESS:  ?Karen [name] is a  76 y.o.  female with PMH listed below who was referred to me for evaluation of persistent leukocytosis/lymphocytosis. ?Reviewed patient's lab work done at PCPs office. ?05/01/2017 WBC 11.7 absolute lymphocytes 7.4. Hemoglobin 13 MCV 95 platelet counts 225,000. ?06/22/2017 WBC is 17.5, absolute lymphocyte 11.7 hemoglobin 11.9, MCV 92, platelet count 240,000. ?Patient reports feeling tired at baseline. Otherwise denies any night sweats, fever or chills. She reports she got sick around holiday. But today she feels better. ?She is active smoker, currently 1 or 2 cigarettes a day ? ?# 10/14/2015 chest without contrast Images were independently reviewed by me ?Small pulmonary nodules are stable over the past year.  Considered benign.  Several lymph nodes are borderline enlarged, including the lower right paratracheal lymph node and a portacaval lymph node. ?08/27/2017 US abdomen complete, no hepato-splenomegaly. ?08/07/2020, patient was diagnosed with COVID-19 was hospitalized. ? ? ?INTERVAL HISTORY ?Karen [name] is a 75 y.o. female who has above history reviewed by me today present for follow-up visit for CLL ? ?Patient recently has had right lower extremity pain.  Also functional eligible for future. ?Patient recently had CT aorta and runoff with IV contrast. ?CT showed moderate atherosclerotic aortic disease.  Diffuse lightest of moderate disease of the bilateral [place] vasculature. ?Diffuse mild stenotic disease of the bilateral SFAs. ?In addition, patient has soft tissue mass/lymph node along the left pelvic sidewall measures 4.9 x 4.7  cm, numerous peritoneum subcu tissue nodule, a deep right pelvic nodule and numerous enlarged mesenteric and retroperitoneal lymph nodes are identified.  For example a right [place] mesenteric lymph node measures 2.4 x 2.2 cm.  The left periaortic lymph node measures 4.4 x 2.0 cm.  A pericardial lymph node measures 2.6 x 1.3 cm. ? ?Patient presents he discussed CT results and management plan.  Denies unintentional weight loss, night sweats or fever.  Goals of care ? ?Review of Systems  ?Constitutional:  Positive for fatigue. Negative for appetite change, chills, fever and unexpected weight change.  ?HENT:   Negative for hearing loss and voice change.   ?Eyes:  Negative for eye problems.  ?Respiratory:  Negative for chest tightness and cough.   ?Cardiovascular:  Negative for chest pain.  ?Gastrointestinal:  Negative for abdominal distention, abdominal pain and blood in stool.  ?Endocrine: Negative for hot flashes.  ?Genitourinary:  Negative for difficulty urinating and frequency.   ?Musculoskeletal:  Positive for back pain. Negative for arthralgias.  ?Skin:  Negative for itching and rash.  ?Neurological:  Negative for extremity weakness.  ?Hematological:  Negative for adenopathy.  ?Psychiatric/Behavioral:  Negative for confusion.   ? ? ?MEDICAL HISTORY:  ?Past Medical History:  ?Diagnosis Date  ? Anxiety   ? Artery occlusion   ? Asthma   ? Back pain   ? Bronchitis   ? Cancer [facility])   ? Skin Cancer  ? CLL (chronic lymphocytic leukemia) ([place])   ? Coronary artery disease   ? Depression   ? Dyspnea   ? GERD (gastroesophageal reflux disease)   ? History of kidney stones   ? Hypercholesteremia   ? Hyperlipemia   ? Hypertension   ?  Hypothyroidism   ? Myocardial infarction [facility]) 1999  ? Neuropathy   ? Restless leg syndrome   ? Sleep apnea   ? use C-PAP  ? Wears dentures   ? full upper, partial lower.  doesn't currently wear  ? ? ?SURGICAL HISTORY: ?Past Surgical History:  ?Procedure Laterality Date  ? COLONOSCOPY WITH  PROPOFOL N/A 11/20/2016  ? Procedure: COLONOSCOPY WITH PROPOFOL;  Surgeon: Lucilla Lame, MD;  Location: [place];  Service: Endoscopy;  Laterality: N/A;  sleep apnea  ? CORONARY ANGIOPLASTY WITH STENT PLACEMENT  2008  ? DILATION AND CURETTAGE OF UTERUS    ? EYE SURGERY Bilateral   ? Cataract Extraction with IOL  ? HAND SURGERY Left 12/2017  ? upper hand fracture repair  ? LEFT HEART CATH AND CORONARY ANGIOGRAPHY N/A 06/29/2017  ? Procedure: LEFT HEART CATH AND CORONARY ANGIOGRAPHY;  Surgeon: Dionisio David, MD;  Location: [place] [facility];  Service: Cardiovascular;  Laterality: N/A;  ? LEFT HEART CATH AND CORONARY ANGIOGRAPHY Left 12/02/2018  ? Procedure: LEFT HEART CATH AND CORONARY ANGIOGRAPHY;  Surgeon: Dionisio David, MD;  Location: [place] [facility];  Service: Cardiovascular;  Laterality: Left;  ? OPEN REDUCTION INTERNAL FIXATION (ORIF) DISTAL RADIAL FRACTURE Right 08/12/2018  ? Procedure: OPEN REDUCTION INTERNAL FIXATION (ORIF) DISTAL RADIAL FRACTURE;  Surgeon: [name], MD;  Location: [facility];  Service: Orthopedics;  Laterality: Right;  ? POLYPECTOMY  11/20/2016  ? Procedure: POLYPECTOMY;  Surgeon: Lucilla Lame, MD;  Location: [place];  Service: Endoscopy;;  ? TUBAL LIGATION    ? VAGINAL HYSTERECTOMY    ? VULVECTOMY Right 01/31/2016  ? Procedure: WIDE EXCISION VULVECTOMY-RIGHT LABIA;  Surgeon: Brayton Mars, MD;  Location: [facility];  Service: Gynecology;  Laterality: Right;  ? ? ?SOCIAL HISTORY: ?Social History  ? ?Socioeconomic History  ? Marital status: Single  ?  Spouse name: Not on file  ? Number of children: 5  ? Years of education: Not on file  ? Highest education level: Not on file  ?Occupational History  ? Occupation: part time job  ?  Comment: [employer]  ?Tobacco Use  ? Smoking status: Every Day  ?  Packs/day: 1.00  ?  Years: 35.00  ?  Pack years: 35.00  ?  Types: Cigarettes  ? Smokeless tobacco: Current  ?  Types: Snuff  ?Vaping Use  ? Vaping Use: Never used   ?Substance and Sexual Activity  ? Alcohol use: No  ?  Alcohol/week: 0.0 standard drinks  ? Drug use: No  ? Sexual activity: Not Currently  ?  Partners: Male  ?  Birth control/protection: Surgical  ?Other Topics Concern  ? Not on file  ?Social History Narrative  ? Not on file  ? ?Social Determinants of Health  ? ?Financial Resource Strain: Not on file  ?Food Insecurity: Not on file  ?Transportation Needs: Not on file  ?Physical Activity: Not on file  ?Stress: Not on file  ?Social Connections: Not on file  ?Intimate Partner Violence: Not on file  ? ? ?FAMILY HISTORY: ?Family History  ?Problem Relation Age of Onset  ? Heart disease Father   ? Stroke Mother   ? Hypertension Mother   ? Lung cancer Brother   ? Heart attack Brother   ? Congestive Heart Failure Sister   ? Cervical cancer Sister   ? Cancer Neg Hx   ? Diabetes Neg Hx   ? Breast cancer Neg Hx   ? ? ?ALLERGIES:  is  allergic to requip [ropinirole hcl], celebrex [celecoxib], meloxicam, and nitrofurantoin. ? ?MEDICATIONS:  ?Current Outpatient Medications  ?Medication Sig Dispense Refill  ? acetaminophen (TYLENOL) 500 MG tablet Take 500-1,000 mg by mouth every 6 (six) hours as needed for moderate pain or fever.    ? albuterol (VENTOLIN HFA) 108 (90 Base) MCG/ACT inhaler Inhale 1-2 puffs into the lungs every 6 (six) hours as needed for wheezing or shortness of breath.    ? amLODipine (NORVASC) 5 MG tablet Take 5 mg by mouth daily. 1 Tablet(s) By Mouth Daily    ? aspirin EC 81 MG tablet Take 1 tablet (81 mg total) by mouth daily. 30 tablet   ? atorvastatin (LIPITOR) 40 MG tablet Take 1 tablet (40 mg total) by mouth daily. 30 tablet 2  ? buPROPion (WELLBUTRIN XL) 150 MG 24 hr tablet Take 150 mg by mouth at bedtime.    ? cilostazol (PLETAL) 100 MG tablet Take 100 mg by mouth 2 (two) times daily.    ? clopidogrel (PLAVIX) 75 MG tablet Take 75 mg by mouth daily.     ? dexlansoprazole (DEXILANT) 60 MG capsule Take 60 mg by mouth daily.     ? gabapentin (NEURONTIN) 400 MG  capsule Take 400 mg by mouth 3 (three) times daily.    ? isosorbide mononitrate (IMDUR) 60 MG 24 hr tablet Take 60 mg by mouth daily.    ? lisinopril (ZESTRIL) 40 MG tablet Hold until followup with outpati

## 2021-12-07 ENCOUNTER — Ambulatory Visit
Admission: RE | Admit: 2021-12-07 | Discharge: 2021-12-07 | Disposition: A | Discharge status: Home / Self Care | Payer: Self-pay | Source: Ambulatory Visit | Attending: Oncology | Admitting: Oncology

## 2021-12-07 ENCOUNTER — Encounter: Payer: Self-pay | Admitting: Cardiovascular Disease

## 2021-12-07 ENCOUNTER — Other Ambulatory Visit: Payer: Self-pay

## 2021-12-07 DIAGNOSIS — H353211 Exudative age-related macular degeneration, right eye, with active choroidal neovascularization: Secondary | ICD-10-CM | POA: Diagnosis not present

## 2021-12-07 DIAGNOSIS — C9110 Chronic lymphocytic leukemia of B-cell type not having achieved remission: Secondary | ICD-10-CM

## 2021-12-07 NOTE — Telephone Encounter (Signed)
Disk taken to radiology for images to be uploaded.  ?

## 2021-12-11 LAB — FISH HES LEUKEMIA, 4Q12 REA

## 2021-12-13 DIAGNOSIS — I2510 Atherosclerotic heart disease of native coronary artery without angina pectoris: Secondary | ICD-10-CM | POA: Diagnosis not present

## 2021-12-13 DIAGNOSIS — G4733 Obstructive sleep apnea (adult) (pediatric): Secondary | ICD-10-CM | POA: Diagnosis not present

## 2021-12-13 DIAGNOSIS — I10 Essential (primary) hypertension: Secondary | ICD-10-CM | POA: Diagnosis not present

## 2021-12-13 DIAGNOSIS — C9111 Chronic lymphocytic leukemia of B-cell type in remission: Secondary | ICD-10-CM | POA: Diagnosis not present

## 2021-12-13 DIAGNOSIS — E782 Mixed hyperlipidemia: Secondary | ICD-10-CM | POA: Diagnosis not present

## 2021-12-13 LAB — IGVH SOMATIC HYPERMUTATION

## 2021-12-26 ENCOUNTER — Other Ambulatory Visit: Payer: Self-pay

## 2021-12-26 ENCOUNTER — Telehealth: Payer: Self-pay

## 2021-12-26 DIAGNOSIS — C9110 Chronic lymphocytic leukemia of B-cell type not having achieved remission: Secondary | ICD-10-CM

## 2021-12-26 DIAGNOSIS — R1900 Intra-abdominal and pelvic swelling, mass and lump, unspecified site: Secondary | ICD-10-CM

## 2021-12-26 NOTE — Telephone Encounter (Signed)
-----   Message from Earlie Server, MD sent at 12/24/2021  2:48 PM EDT ----- Is she scheduled for biopsy yet? I have discussed with Dr.El-Abd

## 2021-12-26 NOTE — Telephone Encounter (Signed)
Anticoagulation clearance request faced to Dr. Trish Mage office for xarelto and plavix.   Request for Ct biopsy of pelvis mass/ lymph node faxed to IR scheduling.

## 2021-12-26 NOTE — Telephone Encounter (Signed)
Order placed

## 2021-12-28 NOTE — Telephone Encounter (Signed)
Pt scheduled for CT biopsy Tues 6/13 @10a  arrive 9a. She will hold plavix for 5 days and xarelto for 1 day. Pt informed and verbalized understanding. The IR nurse will reach out to her prior the appt to go over med instrucitons.

## 2022-01-02 ENCOUNTER — Other Ambulatory Visit: Payer: Self-pay | Admitting: Radiology

## 2022-01-02 DIAGNOSIS — I340 Nonrheumatic mitral (valve) insufficiency: Secondary | ICD-10-CM | POA: Diagnosis not present

## 2022-01-02 DIAGNOSIS — E782 Mixed hyperlipidemia: Secondary | ICD-10-CM | POA: Diagnosis not present

## 2022-01-02 DIAGNOSIS — I739 Peripheral vascular disease, unspecified: Secondary | ICD-10-CM | POA: Diagnosis not present

## 2022-01-02 DIAGNOSIS — K219 Gastro-esophageal reflux disease without esophagitis: Secondary | ICD-10-CM | POA: Diagnosis not present

## 2022-01-02 DIAGNOSIS — M7989 Other specified soft tissue disorders: Secondary | ICD-10-CM

## 2022-01-02 DIAGNOSIS — F17200 Nicotine dependence, unspecified, uncomplicated: Secondary | ICD-10-CM | POA: Diagnosis not present

## 2022-01-02 DIAGNOSIS — I351 Nonrheumatic aortic (valve) insufficiency: Secondary | ICD-10-CM | POA: Diagnosis not present

## 2022-01-02 DIAGNOSIS — I2510 Atherosclerotic heart disease of native coronary artery without angina pectoris: Secondary | ICD-10-CM | POA: Diagnosis not present

## 2022-01-02 DIAGNOSIS — I10 Essential (primary) hypertension: Secondary | ICD-10-CM | POA: Diagnosis not present

## 2022-01-02 DIAGNOSIS — G4733 Obstructive sleep apnea (adult) (pediatric): Secondary | ICD-10-CM | POA: Diagnosis not present

## 2022-01-03 ENCOUNTER — Ambulatory Visit
Admission: RE | Admit: 2022-01-03 | Discharge: 2022-01-03 | Disposition: A | Discharge status: Home / Self Care | Payer: Medicare Other | Source: Ambulatory Visit | Attending: Oncology | Admitting: Oncology

## 2022-01-03 DIAGNOSIS — F17210 Nicotine dependence, cigarettes, uncomplicated: Secondary | ICD-10-CM | POA: Insufficient documentation

## 2022-01-03 DIAGNOSIS — R1900 Intra-abdominal and pelvic swelling, mass and lump, unspecified site: Secondary | ICD-10-CM

## 2022-01-03 DIAGNOSIS — R590 Localized enlarged lymph nodes: Secondary | ICD-10-CM | POA: Diagnosis not present

## 2022-01-03 DIAGNOSIS — M7989 Other specified soft tissue disorders: Secondary | ICD-10-CM | POA: Insufficient documentation

## 2022-01-03 DIAGNOSIS — C9110 Chronic lymphocytic leukemia of B-cell type not having achieved remission: Secondary | ICD-10-CM | POA: Diagnosis not present

## 2022-01-03 LAB — CBC
HCT: 35.9 % — ABNORMAL LOW (ref 36.0–46.0)
Hemoglobin: 11.3 g/dL — ABNORMAL LOW (ref 12.0–15.0)
MCH: 29.8 pg (ref 26.0–34.0)
MCHC: 31.5 g/dL (ref 30.0–36.0)
MCV: 94.7 fL (ref 80.0–100.0)
Platelets: 193 10*3/uL (ref 150–400)
RBC: 3.79 MIL/uL — ABNORMAL LOW (ref 3.87–5.11)
RDW: 13.2 % (ref 11.5–15.5)
WBC: 41.2 10*3/uL — ABNORMAL HIGH (ref 4.0–10.5)
nRBC: 0 % (ref 0.0–0.2)

## 2022-01-03 LAB — PROTIME-INR
INR: 1.1 (ref 0.8–1.2)
Prothrombin Time: 13.8 seconds (ref 11.4–15.2)

## 2022-01-03 MED ORDER — SODIUM CHLORIDE 0.9 % IV SOLN: INTRAVENOUS | Status: DC

## 2022-01-03 MED ORDER — MIDAZOLAM HCL 2 MG/2ML IJ SOLN
INTRAMUSCULAR | Status: AC | PRN
  Administered 2022-01-03 (×2): .5 mg via INTRAVENOUS

## 2022-01-03 MED ORDER — FENTANYL CITRATE (PF) 100 MCG/2ML IJ SOLN
INTRAMUSCULAR | Status: AC
  Filled 2022-01-03: qty 2

## 2022-01-03 MED ORDER — MIDAZOLAM HCL 2 MG/2ML IJ SOLN
INTRAMUSCULAR | Status: AC
  Filled 2022-01-03: qty 4

## 2022-01-03 MED ORDER — FENTANYL CITRATE (PF) 100 MCG/2ML IJ SOLN
INTRAMUSCULAR | Status: AC | PRN
  Administered 2022-01-03: 12.5 ug via INTRAVENOUS
  Administered 2022-01-03: 25 ug via INTRAVENOUS

## 2022-01-03 NOTE — Procedures (Signed)
Interventional Radiology Procedure Note  Date of Procedure: 01/03/2022  Procedure: CT biopsy intra-abdominal lymphadenopathy   Findings:  1. CT biopsy intra-abdominal lymphadenopathy 18ga x6 passes    Complications: No immediate complications noted.   Estimated Blood Loss: minimal  Follow-up and Recommendations: 1. Bedrest 1 hour    Karen Felling, MD  Vascular & Interventional Radiology  01/03/2022 12:05 PM

## 2022-01-03 NOTE — H&P (Signed)
Chief Complaint: Patient was seen in consultation today for retroperitoneal lymph node biopsy at the request of Yu,Zhou  Referring Physician(s): Yu,Zhou  Supervising Physician: Juliet Rude  Patient Status: [facility] - Out-pt  History of Present Illness: Karen [name] is a 76 y.o. female   Known CLL  Follows with Dr Tasia Catchings Pt with persistent leukocytosis/lymphocytosis Dr Tasia Catchings note 12/06/21: patient has soft tissue mass/lymph node along the left pelvic sidewall measures 4.9 x 4.7 cm, numerous peritoneum subcu tissue nodule, a deep right pelvic nodule and numerous enlarged mesenteric and retroperitoneal lymph nodes are identified.  For example a right [place] mesenteric lymph node measures 2.4 x 2.2 cm.  The left periaortic lymph node measures 4.4 x 2.0 cm.  A pericardial lymph node measures 2.6 x 1.3 cm.  Scheduled now for biopsy of left pelvic mass/retroperitoneal lymph node bx Pt denies abd pain  Neg N/V/D  Past Medical History:  Diagnosis Date   Anxiety    Artery occlusion    Asthma    Back pain    Bronchitis    Cancer (HCC)    Skin Cancer   CLL (chronic lymphocytic leukemia) (HCC)    Coronary artery disease    Depression    Dyspnea    GERD (gastroesophageal reflux disease)    History of kidney stones    Hypercholesteremia    Hyperlipemia    Hypertension    Hypothyroidism    Myocardial infarction ([place]) 1999   Neuropathy    Restless leg syndrome    Sleep apnea    use C-PAP   Wears dentures    full upper, partial lower.  doesn't currently wear    Past Surgical History:  Procedure Laterality Date   COLONOSCOPY WITH PROPOFOL N/A 11/20/2016   Procedure: COLONOSCOPY WITH PROPOFOL;  Surgeon: Lucilla Lame, MD;  Location: [place];  Service: Endoscopy;  Laterality: N/A;  sleep apnea   CORONARY ANGIOPLASTY WITH STENT PLACEMENT  2008   DILATION AND CURETTAGE OF UTERUS     EYE SURGERY Bilateral    Cataract Extraction with IOL   HAND SURGERY Left 12/2017    upper hand fracture repair   LEFT HEART CATH AND CORONARY ANGIOGRAPHY N/A 06/29/2017   Procedure: LEFT HEART CATH AND CORONARY ANGIOGRAPHY;  Surgeon: Dionisio David, MD;  Location: [place] [facility];  Service: Cardiovascular;  Laterality: N/A;   LEFT HEART CATH AND CORONARY ANGIOGRAPHY Left 12/02/2018   Procedure: LEFT HEART CATH AND CORONARY ANGIOGRAPHY;  Surgeon: Dionisio David, MD;  Location: [place] [facility];  Service: Cardiovascular;  Laterality: Left;   OPEN REDUCTION INTERNAL FIXATION (ORIF) DISTAL RADIAL FRACTURE Right 08/12/2018   Procedure: OPEN REDUCTION INTERNAL FIXATION (ORIF) DISTAL RADIAL FRACTURE;  Surgeon: [name], MD;  Location: [facility];  Service: Orthopedics;  Laterality: Right;   POLYPECTOMY  11/20/2016   Procedure: POLYPECTOMY;  Surgeon: Lucilla Lame, MD;  Location: [place];  Service: Endoscopy;;   TUBAL LIGATION     VAGINAL HYSTERECTOMY     VULVECTOMY Right 01/31/2016   Procedure: WIDE EXCISION VULVECTOMY-RIGHT LABIA;  Surgeon: Brayton Mars, MD;  Location: [facility];  Service: Gynecology;  Laterality: Right;    Allergies: Requip [ropinirole hcl], Celebrex [celecoxib], Meloxicam, and Nitrofurantoin  Medications: Prior to Admission medications   Medication Sig Start Date End Date Taking? Authorizing Provider  amLODipine (NORVASC) 5 MG tablet Take 5 mg by mouth daily. 1 Tablet(s) By Mouth Daily 08/02/21  Yes [provider]  aspirin EC 81 MG  tablet Take 1 tablet (81 mg total) by mouth daily. 08/12/20  Yes Enzo Bi, MD  buPROPion (WELLBUTRIN XL) 150 MG 24 hr tablet Take 150 mg by mouth at bedtime. 05/08/20  Yes [provider]  clopidogrel (PLAVIX) 75 MG tablet Take 75 mg by mouth daily.    Yes [provider]  isosorbide mononitrate (IMDUR) 60 MG 24 hr tablet Take 60 mg by mouth daily.   Yes [provider]  lisinopril (ZESTRIL) 40 MG tablet Hold until followup with outpatient provider due to acute kidney  injury. 08/12/20  Yes Enzo Bi, MD  metoprolol succinate (TOPROL-XL) 25 MG 24 hr tablet Take 25 mg by mouth daily.    Yes [provider]  acetaminophen (TYLENOL) 500 MG tablet Take 500-1,000 mg by mouth every 6 (six) hours as needed for moderate pain or fever.    [provider]  albuterol (VENTOLIN HFA) 108 (90 Base) MCG/ACT inhaler Inhale 1-2 puffs into the lungs every 6 (six) hours as needed for wheezing or shortness of breath.    [provider]  atorvastatin (LIPITOR) 40 MG tablet Take 1 tablet (40 mg total) by mouth daily. 08/13/20 12/06/21  Enzo Bi, MD  budesonide-formoterol [facility]) 160-4.5 MCG/ACT inhaler Inhale 2 puffs into the lungs 2 (two) times daily as needed (shortness of breath).    [provider]  chlorthalidone (HYGROTON) 25 MG tablet Hold until followup with outpatient provider due to acute kidney injury. Patient not taking: Reported on 09/14/2020 08/12/20   Enzo Bi, MD  cilostazol (PLETAL) 100 MG tablet Take 100 mg by mouth 2 (two) times daily. 12/02/21   [provider]  dexlansoprazole (DEXILANT) 60 MG capsule Take 60 mg by mouth daily.     [provider]  gabapentin (NEURONTIN) 400 MG capsule Take 400 mg by mouth 3 (three) times daily.    [provider]  PREMARIN vaginal cream USE 1 GRAM INTRAVAGINALLY TWICE PER WEEK 06/27/21   Rubie Maid, MD  REPATHA 140 MG/ML SOSY Inject 140 mg into the skin every 14 (fourteen) days. Patient not taking: Reported on 09/14/2020    [provider]  XARELTO 2.5 MG TABS tablet Take 2.5 mg by mouth daily. 12/02/21   [provider]     Family History  Problem Relation Age of Onset   Heart disease Father    Stroke Mother    Hypertension Mother    Lung cancer Brother    Heart attack Brother    Congestive Heart Failure Sister    Cervical cancer Sister    Cancer Neg Hx    Diabetes Neg Hx    Breast cancer Neg Hx     Social History   Socioeconomic  History   Marital status: Single    Spouse name: Not on file   Number of children: 5   Years of education: Not on file   Highest education level: Not on file  Occupational History   Occupation: part time job    Comment: Engineer, building services  Tobacco Use   Smoking status: Every Day    Packs/day: 1.00    Years: 35.00    Total pack years: 35.00    Types: Cigarettes   Smokeless tobacco: Current    Types: Snuff  Vaping Use   Vaping Use: Never used  Substance and Sexual Activity   Alcohol use: No    Alcohol/week: 0.0 standard drinks of alcohol   Drug use: No   Sexual activity: Not Currently  Partners: Male    Birth control/protection: Surgical  Other Topics Concern   Not on file  Social History Narrative   Not on file   Social Determinants of Health   Financial Resource Strain: Low Risk  (12/02/2018)   Overall Financial Resource Strain (CARDIA)    Difficulty of Paying Living Expenses: Not very hard  Food Insecurity: No Food Insecurity (12/02/2018)   Hunger Vital Sign    Worried About Running Out of Food in the Last Year: Never true    Ran Out of Food in the Last Year: Never true  Transportation Needs: No Transportation Needs (12/02/2018)   PRAPARE - Hydrologist (Medical): No    Lack of Transportation (Non-Medical): No  Physical Activity: Inactive (12/18/2017)   Exercise Vital Sign    Days of Exercise per Week: 0 days    Minutes of Exercise per Session: 0 min  Stress: No Stress Concern Present (12/02/2018)   [place]    Feeling of Stress : Only a little  Social Connections: Unknown (12/02/2018)   Social Connection and Isolation Panel [NHANES]    Frequency of Communication with Friends and Family: More than three times a week    Frequency of Social Gatherings with Friends and Family: Not on file    Attends Religious Services: Not on file    Active Member of Clubs or Organizations: Not  on file    Attends Archivist Meetings: Not on file    Marital Status: Not on file    Review of Systems: A 12 point ROS discussed and pertinent positives are indicated in the HPI above.  All other systems are negative.  Review of Systems  Constitutional:  Negative for activity change, fatigue and fever.  Respiratory:  Negative for cough and shortness of breath.   Cardiovascular:  Negative for chest pain.  Gastrointestinal:  Negative for abdominal pain and diarrhea.  Musculoskeletal:  Negative for back pain.  Neurological:  Negative for weakness.  Psychiatric/Behavioral:  Negative for behavioral problems and confusion.     Vital Signs: BP (!) 159/86   Pulse (!) 50   Temp 97.9 F (36.6 C) (Oral)   Resp 13   Ht 5\' 2"  (1.575 m)   Wt 165 lb (74.8 kg)   SpO2 97%   BMI 30.18 kg/m   Physical Exam Vitals reviewed.  HENT:     Mouth/Throat:     Mouth: Mucous membranes are moist.  Cardiovascular:     Rate and Rhythm: Normal rate and regular rhythm.     Heart sounds: Normal heart sounds.  Pulmonary:     Effort: Pulmonary effort is normal.     Breath sounds: Normal breath sounds. No wheezing.  Abdominal:     Tenderness: There is no abdominal tenderness.  Musculoskeletal:        General: Normal range of motion.  Skin:    General: Skin is warm.  Neurological:     Mental Status: She is alert and oriented to person, place, and time.  Psychiatric:        Behavior: Behavior normal.     Imaging: CT OUTSIDE FILMS BODY/ABD/PELVIS  Result Date: 12/08/2021 This examination belongs to an outside facility and is stored here for comparison purposes only.  Contact the originating outside institution for any associated report or interpretation.   Labs:  CBC: Recent Labs    08/23/21 1309 12/06/21 1435  WBC 39.2* 37.6*  HGB 12.9  11.6*  HCT 40.6 36.3  PLT 203 183    COAGS: No results for input(s): "INR", "APTT" in the last 8760 hours.  BMP: Recent Labs     08/23/21 1309 12/06/21 1435  NA 137 137  K 4.1 3.5  CL 103 102  CO2 28 26  GLUCOSE 97 117*  BUN 13 11  CALCIUM 9.9 9.5  CREATININE 1.19* 1.15*  GFRNONAA 47* 49*    LIVER FUNCTION TESTS: Recent Labs    08/23/21 1309 12/06/21 1435  BILITOT 0.4 0.3  AST 15 17  ALT 14 15  ALKPHOS 71 69  PROT 7.0 6.7  ALBUMIN 4.3 4.0    TUMOR MARKERS: No results for input(s): "AFPTM", "CEA", "CA199", "CHROMGRNA" in the last 8760 hours.  Assessment and Plan:  Known chronic lymphocytic leukemia Follows with Dr Tasia Catchings  New findings of abnormal CT- revealing Lymphadenopathy Left pelvic wall mass Scheduled for biopsy today Risks and benefits of retroperitoneal node/pelvic mass biopsy was discussed with the patient and/or patient's family including, but not limited to bleeding, infection, damage to adjacent structures or low yield requiring additional tests.  All of the questions were answered and there is agreement to proceed.  Consent signed and in chart.   Thank you for this interesting consult.  I greatly enjoyed meeting Karen [name] and look forward to participating in their care.  A copy of this report was sent to the requesting provider on this date.  Electronically Signed: Lavonia Drafts, PA-C 01/03/2022, 9:58 AM   I spent a total of  30 Minutes   in face to face in clinical consultation, greater than 50% of which was counseling/coordinating care for left pelvic mass biopsy/retroperitoneal node biopsy

## 2022-01-05 ENCOUNTER — Encounter: Payer: Self-pay | Admitting: Oncology

## 2022-01-05 DIAGNOSIS — H353211 Exudative age-related macular degeneration, right eye, with active choroidal neovascularization: Secondary | ICD-10-CM | POA: Diagnosis not present

## 2022-01-05 LAB — SURGICAL PATHOLOGY

## 2022-01-07 ENCOUNTER — Other Ambulatory Visit: Payer: Self-pay | Admitting: Oncology

## 2022-01-07 MED ORDER — ACALABRUTINIB 100 MG PO CAPS: 100.0000 mg | ORAL_CAPSULE | Freq: Two times a day (BID) | ORAL | 2 refills | Status: DC

## 2022-01-09 ENCOUNTER — Other Ambulatory Visit (HOSPITAL_COMMUNITY): Payer: Self-pay

## 2022-01-09 ENCOUNTER — Telehealth: Payer: Self-pay | Admitting: Pharmacy Technician

## 2022-01-09 ENCOUNTER — Telehealth: Payer: Self-pay

## 2022-01-09 ENCOUNTER — Telehealth: Payer: Self-pay | Admitting: Pharmacist

## 2022-01-09 DIAGNOSIS — C9110 Chronic lymphocytic leukemia of B-cell type not having achieved remission: Secondary | ICD-10-CM

## 2022-01-09 MED ORDER — CALQUENCE 100 MG PO TABS
1.0000 | ORAL_TABLET | Freq: Two times a day (BID) | ORAL | 2 refills | Status: DC
  Filled 2022-01-09: qty 60, fill #0
  Filled 2022-01-09: qty 60, 30d supply, fill #0

## 2022-01-09 NOTE — Telephone Encounter (Signed)
PT informed of plan.

## 2022-01-09 NOTE — Telephone Encounter (Signed)
-----   Message from Earlie Server, MD sent at 01/07/2022 12:15 PM EDT ----- Please let her know that lymph node Biopsy is positive for CLL. I plan to start her on Acalabrutinib 100mg  BID.  Alyson and Bethena Roys, please check coverage.  Please schedule her to see me 1-2 weeks flex after starting. Lab MD cbc cmp thanks.

## 2022-01-09 NOTE — Telephone Encounter (Signed)
Per Nuala Alpha, pt will start Calquence on 6/21. Follow up appts have been set up.

## 2022-01-09 NOTE — Telephone Encounter (Signed)
Oral Chemotherapy Pharmacist Encounter  Patient Education I spoke with patient for overview of new oral chemotherapy medication: Calquence (acalabrutinib) for the treatment of CLL (previously under observation), planned duration until disease progression or unacceptable drug toxicity.   Pt is doing well. Counseled patient on administration, dosing, side effects, monitoring, drug-food interactions, safe handling, storage, and disposal. Patient will take 1 tablet by mouth 2 (two) times daily.  Side effects include but not limited to: headache, diarrhea, decreased wbc/plt/hgb.   Headache: patient knows that acalabrutinib headaches typically respond well to caffeine Diarrhea: instructed patient to use loperamide if diarrhea occurs and to tell the office is she is having 4 or more loose stools per day  Reviewed with patient importance of keeping a medication schedule and plan for any missed doses.  After discussion with patient no patient barriers to medication adherence identified.   Ms. Willetts voiced understanding and appreciation. All questions answered. Medication handout provided.  Provided patient with Oral [facility] phone number. Patient knows to call the office with questions or concerns. Oral Chemotherapy Navigation Clinic will continue to follow.  Darl Pikes, PharmD, BCPS, BCOP, CPP Hematology/Oncology Clinical Pharmacist Practitioner [place]/DB/AP [facility] [phone]  01/09/2022 3:50 PM

## 2022-01-09 NOTE — Telephone Encounter (Signed)
Oral Oncology Patient Advocate Encounter  After completing a benefits investigation, prior authorization for Calquence is not required at this time through [facility].  Patient's copay is $0.00.  [place] Patient [place] Phone [phone] Fax [phone] 01/09/2022 9:01 AM

## 2022-01-09 NOTE — Telephone Encounter (Signed)
Oral Oncology Patient Advocate Encounter  I spoke with Karen [name] this afternoon to set up delivery of Calquence.  Address verified for shipment and will be filled through [facility] and mailed 01/10/22 for delivery 01/11/22.    [facility] will call 7-10 days before next refill is due to complete adherence call and set up delivery of medication.     [name] Patient [place] Phone [phone] Fax [phone] 01/09/2022 3:49 PM

## 2022-01-09 NOTE — Telephone Encounter (Signed)
Oral Oncology Pharmacist Encounter  Received new prescription for Calquence (acalabrutinib) for the treatment of CLL (previously under observation), planned duration until disease progression or unacceptable drug toxicity.  Prescription dose and frequency assessed.   Current medication list in Epic reviewed, no DDIs with acalabrutinib identified.  Evaluated chart and no patient barriers to medication adherence identified.   Prescription has been e-scribed to the [facility] for benefits analysis and approval.  Oral Oncology Clinic will continue to follow for insurance authorization, copayment issues, initial counseling and start date.   Darl Pikes, PharmD, BCPS, BCOP, CPP Hematology/Oncology Clinical Pharmacist Practitioner [place]/DB/AP [facility] [phone]  01/09/2022 9:29 AM

## 2022-01-10 ENCOUNTER — Other Ambulatory Visit (HOSPITAL_COMMUNITY): Payer: Self-pay

## 2022-01-26 ENCOUNTER — Encounter: Payer: Self-pay | Admitting: Oncology

## 2022-01-26 ENCOUNTER — Inpatient Hospital Stay (HOSPITAL_BASED_OUTPATIENT_CLINIC_OR_DEPARTMENT_OTHER): Payer: Medicare Other | Admitting: Oncology

## 2022-01-26 ENCOUNTER — Inpatient Hospital Stay: Payer: Medicare Other | Attending: Oncology

## 2022-01-26 ENCOUNTER — Inpatient Hospital Stay: Payer: Medicare Other | Admitting: Pharmacist

## 2022-01-26 VITALS — BP 157/71 | HR 60 | Temp 96.7°F | Ht 62.0 in | Wt 152.0 lb

## 2022-01-26 DIAGNOSIS — C9110 Chronic lymphocytic leukemia of B-cell type not having achieved remission: Secondary | ICD-10-CM

## 2022-01-26 DIAGNOSIS — R519 Headache, unspecified: Secondary | ICD-10-CM | POA: Diagnosis not present

## 2022-01-26 DIAGNOSIS — T451X5A Adverse effect of antineoplastic and immunosuppressive drugs, initial encounter: Secondary | ICD-10-CM | POA: Diagnosis not present

## 2022-01-26 DIAGNOSIS — Z1283 Encounter for screening for malignant neoplasm of skin: Secondary | ICD-10-CM | POA: Insufficient documentation

## 2022-01-26 DIAGNOSIS — F17210 Nicotine dependence, cigarettes, uncomplicated: Secondary | ICD-10-CM | POA: Diagnosis not present

## 2022-01-26 DIAGNOSIS — R110 Nausea: Secondary | ICD-10-CM

## 2022-01-26 DIAGNOSIS — Z5111 Encounter for antineoplastic chemotherapy: Secondary | ICD-10-CM | POA: Diagnosis not present

## 2022-01-26 LAB — CBC WITH DIFFERENTIAL/PLATELET
Abs Immature Granulocytes: 0.1 10*3/uL — ABNORMAL HIGH (ref 0.00–0.07)
Basophils Absolute: 0 10*3/uL (ref 0.0–0.1)
Basophils Relative: 0 %
Eosinophils Absolute: 0.1 10*3/uL (ref 0.0–0.5)
Eosinophils Relative: 0 %
HCT: 32.6 % — ABNORMAL LOW (ref 36.0–46.0)
Hemoglobin: 10.2 g/dL — ABNORMAL LOW (ref 12.0–15.0)
Immature Granulocytes: 0 %
Lymphocytes Relative: 90 %
Lymphs Abs: 52.8 10*3/uL — ABNORMAL HIGH (ref 0.7–4.0)
MCH: 30 pg (ref 26.0–34.0)
MCHC: 31.3 g/dL (ref 30.0–36.0)
MCV: 95.9 fL (ref 80.0–100.0)
Monocytes Absolute: 1 10*3/uL (ref 0.1–1.0)
Monocytes Relative: 2 %
Neutro Abs: 4.5 10*3/uL (ref 1.7–7.7)
Neutrophils Relative %: 8 %
Platelets: 255 10*3/uL (ref 150–400)
RBC: 3.4 MIL/uL — ABNORMAL LOW (ref 3.87–5.11)
RDW: 12.9 % (ref 11.5–15.5)
Smear Review: NORMAL
WBC: 58.5 10*3/uL (ref 4.0–10.5)
nRBC: 0 % (ref 0.0–0.2)

## 2022-01-26 LAB — COMPREHENSIVE METABOLIC PANEL
ALT: 10 U/L (ref 0–44)
AST: 11 U/L — ABNORMAL LOW (ref 15–41)
Albumin: 3.6 g/dL (ref 3.5–5.0)
Alkaline Phosphatase: 84 U/L (ref 38–126)
Anion gap: 6 (ref 5–15)
BUN: 10 mg/dL (ref 8–23)
CO2: 26 mmol/L (ref 22–32)
Calcium: 9.3 mg/dL (ref 8.9–10.3)
Chloride: 101 mmol/L (ref 98–111)
Creatinine, Ser: 0.87 mg/dL (ref 0.44–1.00)
GFR, Estimated: >60 mL/min (ref >60)
Glucose, Bld: 91 mg/dL (ref 70–99)
Potassium: 3.4 mmol/L — ABNORMAL LOW (ref 3.5–5.1)
Sodium: 133 mmol/L — ABNORMAL LOW (ref 135–145)
Total Bilirubin: 0.6 mg/dL (ref 0.3–1.2)
Total Protein: 6.8 g/dL (ref 6.5–8.1)

## 2022-01-26 LAB — LACTATE DEHYDROGENASE: LDH: 94 U/L — ABNORMAL LOW (ref 98–192)

## 2022-01-26 MED ORDER — ONDANSETRON HCL 4 MG PO TABS: 4.0000 mg | ORAL_TABLET | Freq: Three times a day (TID) | ORAL | 1 refills | Status: DC | PRN

## 2022-01-26 MED ORDER — ACETAMINOPHEN-CODEINE 300-30 MG PO TABS: 1.0000 | ORAL_TABLET | Freq: Two times a day (BID) | ORAL | 0 refills | Status: DC | PRN

## 2022-01-26 NOTE — Progress Notes (Addendum)
Hematology/Oncology Progress note [phone] [phone]         Patient Care Team: Perrin Maltese, MD as PCP - General (Internal Medicine) Earlie Server, MD as Consulting Physician (Hematology and Oncology)  ASSESSMENT & PLAN:   CLL (chronic lymphocytic leukemia) [facility]) Patient tolerates acalabrutinib 100 mg twice daily  Labs are reviewed and discussed with patient.  Leukocytosis is anticipated. Continue current regimen.  Encounter for antineoplastic chemotherapy Treatment plan as listed above  Chemotherapy-induced nausea Recommend Zofran 4 mg every 8 hours as needed.  Headache Could be secondary to acalabrutinib. Not improved with caffeine intake, or Tylenol. Recommend Tylenol 3 every 12 hours as needed  Orders Placed This Encounter  Procedures   CBC with Differential/Platelet    Standing Status:   Future    Standing Expiration Date:   01/27/2023   Comprehensive metabolic panel    Standing Status:   Future    Standing Expiration Date:   01/26/2023   Lactate dehydrogenase    Standing Status:   Future    Standing Expiration Date:   01/27/2023   Follow-up in 3 weeks for evaluation tolerance. All questions were answered. The patient knows to call the clinic with any problems, questions or concerns.  Earlie Server, MD, PhD [facility] Hematology Oncology 01/26/2022     REASON FOR VISIT Follow up for treatment of CLL  HISTORY OF PRESENTING ILLNESS:  Karen [name] is a  76 y.o.  female with PMH listed below who presents for follow-up of CLL treatments. Oncology history summary listed as below. Oncology History  CLL (chronic lymphocytic leukemia) ([place])  09/24/2015 Imaging   10/14/2015 chest without contrast Images were independently reviewed by me Small pulmonary nodules are stable over the past year.  Considered benign.  Several lymph nodes are borderline enlarged, including the lower right paratracheal lymph node and a portacaval lymph node. 08/27/2017 US  abdomen complete, no hepato-splenomegaly.   12/18/2017 Initial Diagnosis   CLL (chronic lymphocytic leukemia)   07/30/2017, peripheral blood flow cytometry showed CD5+, CD23+, CD38 negative monoclonal B-cell population consistent with a diagnosis of CLL   06/27/2019 Cancer Staging   Staging form: Chronic Lymphocytic Leukemia / Small Lymphocytic Lymphoma, AJCC 8th Edition - Clinical: Modified Rai Stage I (Modified Rai risk: Intermediate, Lymphocytosis: Present, Adenopathy: Present, Organomegaly: Absent, Anemia: Absent, Thrombocytopenia: Absent) - Signed by Earlie Server, MD on 12/06/2021   11/27/2021 Imaging   Outside facility CT Aorta and run off w wo  showed moderate atherosclerotic aortic disease.  Diffuse lightest of moderate disease of the bilateral Udenyca vasculature.Diffuse mild stenotic disease of the bilateral SFAs. In addition, patient has soft tissue mass/lymph node along the left pelvic sidewall measures 4.9 x 4.7 cm, numerous peritoneum subcu tissue nodule, a deep right pelvic nodule and numerous enlarged mesenteric and retroperitoneal lymph nodes are identified.  For example a right Tecentriq mesenteric lymph node measures 2.4 x 2.2 cm.  The left periaortic lymph node measures 4.4 x 2.0 cm.  A pericardial lymph node measures 2.6 x 1.3 cm.   01/03/2022 Pathology Results   Right abdomen lymph node biopsy showed chronic lymphocytic leukemia/small lymphocytic lymphoma    01/09/2022 -  Chemotherapy   Acalabrutinib 100 mg twice daily    Patient has started on acalabrutinib 100 mg twice daily for 2 weeks.  Overall she tolerates treatment She has noticed nausea after taking acalabrutinib.  No vomiting. She also noticed headache which did not improve after drinking coffee or taking Tylenol.   Review of Systems  Constitutional:  Positive for fatigue. Negative for appetite change, chills, fever and unexpected weight change.  HENT:   Negative for hearing loss and voice change.   Eyes:  Negative  for eye problems.  Respiratory:  Negative for chest tightness and cough.   Cardiovascular:  Negative for chest pain.  Gastrointestinal:  Positive for nausea. Negative for abdominal distention, abdominal pain and blood in stool.  Endocrine: Negative for hot flashes.  Genitourinary:  Negative for difficulty urinating and frequency.   Musculoskeletal:  Positive for back pain. Negative for arthralgias.  Skin:  Negative for itching and rash.  Neurological:  Positive for headaches. Negative for extremity weakness.  Hematological:  Negative for adenopathy.  Psychiatric/Behavioral:  Negative for confusion.      MEDICAL HISTORY:  Past Medical History:  Diagnosis Date   Anxiety    Artery occlusion    Asthma    Back pain    Bronchitis    Cancer (HCC)    Skin Cancer   CLL (chronic lymphocytic leukemia) (HCC)    Coronary artery disease    Depression    Dyspnea    GERD (gastroesophageal reflux disease)    History of kidney stones    Hypercholesteremia    Hyperlipemia    Hypertension    Hypothyroidism    Myocardial infarction ([place]) 1999   Neuropathy    Restless leg syndrome    Sleep apnea    use C-PAP   Wears dentures    full upper, partial lower.  doesn't currently wear    SURGICAL HISTORY: Past Surgical History:  Procedure Laterality Date   COLONOSCOPY WITH PROPOFOL N/A 11/20/2016   Procedure: COLONOSCOPY WITH PROPOFOL;  Surgeon: Lucilla Lame, MD;  Location: [place];  Service: Endoscopy;  Laterality: N/A;  sleep apnea   CORONARY ANGIOPLASTY WITH STENT PLACEMENT  2008   DILATION AND CURETTAGE OF UTERUS     EYE SURGERY Bilateral    Cataract Extraction with IOL   HAND SURGERY Left 12/2017   upper hand fracture repair   LEFT HEART CATH AND CORONARY ANGIOGRAPHY N/A 06/29/2017   Procedure: LEFT HEART CATH AND CORONARY ANGIOGRAPHY;  Surgeon: Dionisio David, MD;  Location: [place] [facility];  Service: Cardiovascular;  Laterality: N/A;   LEFT HEART CATH AND CORONARY  ANGIOGRAPHY Left 12/02/2018   Procedure: LEFT HEART CATH AND CORONARY ANGIOGRAPHY;  Surgeon: Dionisio David, MD;  Location: [place] [facility];  Service: Cardiovascular;  Laterality: Left;   OPEN REDUCTION INTERNAL FIXATION (ORIF) DISTAL RADIAL FRACTURE Right 08/12/2018   Procedure: OPEN REDUCTION INTERNAL FIXATION (ORIF) DISTAL RADIAL FRACTURE;  Surgeon: [name], MD;  Location: [facility];  Service: Orthopedics;  Laterality: Right;   POLYPECTOMY  11/20/2016   Procedure: POLYPECTOMY;  Surgeon: Lucilla Lame, MD;  Location: [place];  Service: Endoscopy;;   TUBAL LIGATION     VAGINAL HYSTERECTOMY     VULVECTOMY Right 01/31/2016   Procedure: WIDE EXCISION VULVECTOMY-RIGHT LABIA;  Surgeon: Brayton Mars, MD;  Location: [facility];  Service: Gynecology;  Laterality: Right;    SOCIAL HISTORY: Social History   Socioeconomic History   Marital status: Single    Spouse name: Not on file   Number of children: 5   Years of education: Not on file   Highest education level: Not on file  Occupational History   Occupation: part time job    Comment: [employer]  Tobacco Use   Smoking status: Every Day    Packs/day: 1.00  Years: 35.00    Total pack years: 35.00    Types: Cigarettes   Smokeless tobacco: Current    Types: Snuff  Vaping Use   Vaping Use: Never used  Substance and Sexual Activity   Alcohol use: No    Alcohol/week: 0.0 standard drinks of alcohol   Drug use: No   Sexual activity: Not Currently    Partners: Male    Birth control/protection: Surgical  Other Topics Concern   Not on file  Social History Narrative   Not on file   Social Determinants of Health   Financial Resource Strain: Low Risk  (12/02/2018)   Overall Financial Resource Strain (CARDIA)    Difficulty of Paying Living Expenses: Not very hard  Food Insecurity: No Food Insecurity (12/02/2018)   Hunger Vital Sign    Worried About Running Out of Food in the Last Year: Never true    Ran Out of  Food in the Last Year: Never true  Transportation Needs: No Transportation Needs (12/02/2018)   PRAPARE - Hydrologist (Medical): No    Lack of Transportation (Non-Medical): No  Physical Activity: Inactive (12/18/2017)   Exercise Vital Sign    Days of Exercise per Week: 0 days    Minutes of Exercise per Session: 0 min  Stress: No Stress Concern Present (12/02/2018)   Bee    Feeling of Stress : Only a little  Social Connections: Unknown (12/02/2018)   Social Connection and Isolation Panel [NHANES]    Frequency of Communication with Friends and Family: More than three times a week    Frequency of Social Gatherings with Friends and Family: Not on file    Attends Religious Services: Not on file    Active Member of Clubs or Organizations: Not on file    Attends Archivist Meetings: Not on file    Marital Status: Not on file  Intimate Partner Violence: Not At Risk (12/02/2018)   Humiliation, Afraid, Rape, and Kick questionnaire    Fear of Current or Ex-Partner: No    Emotionally Abused: No    Physically Abused: No    Sexually Abused: No    FAMILY HISTORY: Family History  Problem Relation Age of Onset   Heart disease Father    Stroke Mother    Hypertension Mother    Lung cancer Brother    Heart attack Brother    Congestive Heart Failure Sister    Cervical cancer Sister    Cancer Neg Hx    Diabetes Neg Hx    Breast cancer Neg Hx     ALLERGIES:  is allergic to requip [ropinirole hcl], celebrex [celecoxib], meloxicam, and nitrofurantoin.  MEDICATIONS:  Current Outpatient Medications  Medication Sig Dispense Refill   acalabrutinib maleate (CALQUENCE) 100 MG TABS Take 1 tablet by mouth 2 (two) times daily. 60 tablet 2   acetaminophen (TYLENOL) 500 MG tablet Take 500-1,000 mg by mouth every 6 (six) hours as needed for moderate pain or fever.     acetaminophen-codeine (TYLENOL  #3) 300-30 MG tablet Take 1 tablet by mouth every 12 (twelve) hours as needed for moderate pain or severe pain. 30 tablet 0   albuterol (VENTOLIN HFA) 108 (90 Base) MCG/ACT inhaler Inhale 1-2 puffs into the lungs every 6 (six) hours as needed for wheezing or shortness of breath.     amLODipine (NORVASC) 5 MG tablet Take 5 mg by mouth daily. 1 Tablet(s) By Mouth Daily  aspirin EC 81 MG tablet Take 1 tablet (81 mg total) by mouth daily. 30 tablet    buPROPion (WELLBUTRIN XL) 150 MG 24 hr tablet Take 150 mg by mouth at bedtime.     clopidogrel (PLAVIX) 75 MG tablet Take 75 mg by mouth daily.      dexlansoprazole (DEXILANT) 60 MG capsule Take 60 mg by mouth daily.      gabapentin (NEURONTIN) 400 MG capsule Take 400 mg by mouth 3 (three) times daily.     isosorbide mononitrate (IMDUR) 60 MG 24 hr tablet Take 60 mg by mouth daily.     lisinopril (ZESTRIL) 40 MG tablet Hold until followup with outpatient provider due to acute kidney injury.     metoprolol succinate (TOPROL-XL) 25 MG 24 hr tablet Take 25 mg by mouth daily.      ondansetron (ZOFRAN) 4 MG tablet Take 1 tablet (4 mg total) by mouth every 8 (eight) hours as needed for nausea or vomiting. 60 tablet 1   PREMARIN vaginal cream USE 1 GRAM INTRAVAGINALLY TWICE PER WEEK 30 g 3   XARELTO 2.5 MG TABS tablet Take 2.5 mg by mouth daily.     atorvastatin (LIPITOR) 40 MG tablet Take 1 tablet (40 mg total) by mouth daily. 30 tablet 2   budesonide-formoterol (SYMBICORT) 160-4.5 MCG/ACT inhaler Inhale 2 puffs into the lungs 2 (two) times daily as needed (shortness of breath).     chlorthalidone (HYGROTON) 25 MG tablet Hold until followup with outpatient provider due to acute kidney injury. (Patient not taking: Reported on 09/14/2020)     cilostazol (PLETAL) 100 MG tablet Take 100 mg by mouth 2 (two) times daily. (Patient not taking: Reported on 01/03/2022)     REPATHA 140 MG/ML SOSY Inject 140 mg into the skin every 14 (fourteen) days. (Patient not taking:  Reported on 09/14/2020)     No current facility-administered medications for this visit.     PHYSICAL EXAMINATION: ECOG PERFORMANCE STATUS: 1 - Symptomatic but completely ambulatory Vitals:   01/26/22 1017  BP: (!) 157/71  Pulse: 60  Temp: (!) 96.7 F (35.9 C)   Filed Weights   01/26/22 1017  Weight: 152 lb (68.9 kg)    Physical Exam Constitutional:      General: She is not in acute distress.    Appearance: She is not diaphoretic.  HENT:     Head: Normocephalic and atraumatic.     Nose: Nose normal.     Mouth/Throat:     Pharynx: No oropharyngeal exudate.  Eyes:     General: No scleral icterus.       Left eye: No discharge.     Conjunctiva/sclera: Conjunctivae normal.     Pupils: Pupils are equal, round, and reactive to light.  Neck:     Vascular: No JVD.  Cardiovascular:     Rate and Rhythm: Normal rate and regular rhythm.     Heart sounds: No murmur heard. Pulmonary:     Effort: Pulmonary effort is normal. No respiratory distress.     Breath sounds: No rales.  Chest:     Chest wall: No tenderness.  Abdominal:     General: Bowel sounds are normal. There is no distension.     Palpations: Abdomen is soft.     Tenderness: There is no abdominal tenderness.  Musculoskeletal:        General: Normal range of motion.     Cervical back: Normal range of motion and neck supple.  Lymphadenopathy:  Cervical: No cervical adenopathy.  Skin:    General: Skin is warm and dry.     Findings: No erythema.  Neurological:     Mental Status: She is alert and oriented to person, place, and time.     Cranial Nerves: No cranial nerve deficit.     Motor: No abnormal muscle tone.     Coordination: Coordination normal.  Psychiatric:        Mood and Affect: Affect normal.        Judgment: Judgment normal.      LABORATORY DATA:  I have reviewed the data as listed Lab Results  Component Value Date   WBC 58.5 (HH) 01/26/2022   HGB 10.2 (L) 01/26/2022   HCT 32.6 (L)  01/26/2022   MCV 95.9 01/26/2022   PLT 255 01/26/2022

## 2022-01-26 NOTE — Assessment & Plan Note (Signed)
Could be secondary to acalabrutinib. Not improved with caffeine intake, or Tylenol. Recommend Tylenol 3 every 12 hours as needed

## 2022-01-26 NOTE — Assessment & Plan Note (Signed)
Recommend Zofran 4 mg every 8 hours as needed.

## 2022-01-26 NOTE — Assessment & Plan Note (Signed)
Patient tolerates acalabrutinib 100 mg twice daily  Labs are reviewed and discussed with patient.  Leukocytosis is anticipated. Continue current regimen.

## 2022-01-26 NOTE — Assessment & Plan Note (Signed)
Treatment plan as listed above

## 2022-01-27 NOTE — Progress Notes (Signed)
[place]  Telephone:([phone] [phone]  Patient Care Team: Perrin Maltese, MD as PCP - General (Internal Medicine) Earlie Server, MD as Consulting Physician (Hematology and Oncology)   Name of the patient: Karen [name]  [id number]  [birth year]   Date of visit: 01/27/22  HPI: Patient is a 76 y.o. female with  CLL, previously under observation, now requiring treatment. She started Calquence (acalabrutinib) on 01/11/22   Reason for Consult: Oral chemotherapy follow-up for acalabrutinib therapy.   PAST MEDICAL HISTORY: Past Medical History:  Diagnosis Date   Anxiety    Artery occlusion    Asthma    Back pain    Bronchitis    Cancer (HCC)    Skin Cancer   CLL (chronic lymphocytic leukemia) (HCC)    Coronary artery disease    Depression    Dyspnea    GERD (gastroesophageal reflux disease)    History of kidney stones    Hypercholesteremia    Hyperlipemia    Hypertension    Hypothyroidism    Myocardial infarction ([place]) 1999   Neuropathy    Restless leg syndrome    Sleep apnea    use C-PAP   Wears dentures    full upper, partial lower.  doesn't currently wear    HEMATOLOGY/ONCOLOGY HISTORY:  Oncology History  CLL (chronic lymphocytic leukemia) ([place])  09/24/2015 Imaging   10/14/2015 chest without contrast Images were independently reviewed by me Small pulmonary nodules are stable over the past year.  Considered benign.  Several lymph nodes are borderline enlarged, including the lower right paratracheal lymph node and a portacaval lymph node. 08/27/2017 US abdomen complete, no hepato-splenomegaly.   12/18/2017 Initial Diagnosis   CLL (chronic lymphocytic leukemia)   07/30/2017, peripheral blood flow cytometry showed CD5+, CD23+, CD38 negative monoclonal B-cell population consistent with a diagnosis of CLL   06/27/2019 Cancer Staging   Staging form: Chronic Lymphocytic Leukemia / Small Lymphocytic Lymphoma, AJCC 8th  Edition - Clinical: Modified Rai Stage I (Modified Rai risk: Intermediate, Lymphocytosis: Present, Adenopathy: Present, Organomegaly: Absent, Anemia: Absent, Thrombocytopenia: Absent) - Signed by Earlie Server, MD on 12/06/2021   11/27/2021 Imaging   Outside facility CT Aorta and run off w wo  showed moderate atherosclerotic aortic disease.  Diffuse lightest of moderate disease of the bilateral Udenyca vasculature.Diffuse mild stenotic disease of the bilateral SFAs. In addition, patient has soft tissue mass/lymph node along the left pelvic sidewall measures 4.9 x 4.7 cm, numerous peritoneum subcu tissue nodule, a deep right pelvic nodule and numerous enlarged mesenteric and retroperitoneal lymph nodes are identified.  For example a right Tecentriq mesenteric lymph node measures 2.4 x 2.2 cm.  The left periaortic lymph node measures 4.4 x 2.0 cm.  A pericardial lymph node measures 2.6 x 1.3 cm.   01/03/2022 Pathology Results   Right abdomen lymph node biopsy showed chronic lymphocytic leukemia/small lymphocytic lymphoma    01/09/2022 -  Chemotherapy   Acalabrutinib 100 mg twice daily     ALLERGIES:  is allergic to requip [ropinirole hcl], celebrex [celecoxib], meloxicam, and nitrofurantoin.  MEDICATIONS:  Current Outpatient Medications  Medication Sig Dispense Refill   acalabrutinib maleate (CALQUENCE) 100 MG TABS Take 1 tablet by mouth 2 (two) times daily. 60 tablet 2   acetaminophen (TYLENOL) 500 MG tablet Take 500-1,000 mg by mouth every 6 (six) hours as needed for moderate pain or fever.     acetaminophen-codeine (TYLENOL #3) 300-30 MG tablet Take 1 tablet by mouth every 12 (twelve)  hours as needed for moderate pain or severe pain. 30 tablet 0   albuterol (VENTOLIN HFA) 108 (90 Base) MCG/ACT inhaler Inhale 1-2 puffs into the lungs every 6 (six) hours as needed for wheezing or shortness of breath.     amLODipine (NORVASC) 5 MG tablet Take 5 mg by mouth daily. 1 Tablet(s) By Mouth Daily     aspirin  EC 81 MG tablet Take 1 tablet (81 mg total) by mouth daily. 30 tablet    atorvastatin (LIPITOR) 40 MG tablet Take 1 tablet (40 mg total) by mouth daily. 30 tablet 2   budesonide-formoterol (SYMBICORT) 160-4.5 MCG/ACT inhaler Inhale 2 puffs into the lungs 2 (two) times daily as needed (shortness of breath).     buPROPion (WELLBUTRIN XL) 150 MG 24 hr tablet Take 150 mg by mouth at bedtime.     chlorthalidone (HYGROTON) 25 MG tablet Hold until followup with outpatient provider due to acute kidney injury. (Patient not taking: Reported on 09/14/2020)     cilostazol (PLETAL) 100 MG tablet Take 100 mg by mouth 2 (two) times daily. (Patient not taking: Reported on 01/03/2022)     clopidogrel (PLAVIX) 75 MG tablet Take 75 mg by mouth daily.      dexlansoprazole (DEXILANT) 60 MG capsule Take 60 mg by mouth daily.      gabapentin (NEURONTIN) 400 MG capsule Take 400 mg by mouth 3 (three) times daily.     isosorbide mononitrate (IMDUR) 60 MG 24 hr tablet Take 60 mg by mouth daily.     lisinopril (ZESTRIL) 40 MG tablet Hold until followup with outpatient provider due to acute kidney injury.     metoprolol succinate (TOPROL-XL) 25 MG 24 hr tablet Take 25 mg by mouth daily.      ondansetron (ZOFRAN) 4 MG tablet Take 1 tablet (4 mg total) by mouth every 8 (eight) hours as needed for nausea or vomiting. 60 tablet 1   PREMARIN vaginal cream USE 1 GRAM INTRAVAGINALLY TWICE PER WEEK 30 g 3   REPATHA 140 MG/ML SOSY Inject 140 mg into the skin every 14 (fourteen) days. (Patient not taking: Reported on 09/14/2020)     XARELTO 2.5 MG TABS tablet Take 2.5 mg by mouth daily.     No current facility-administered medications for this visit.    VITAL SIGNS: There were no vitals taken for this visit. There were no vitals filed for this visit.  Estimated body mass index is 27.8 kg/m as calculated from the following:   Height as of an earlier encounter on 01/26/22: 5\' 2"  (1.575 m).   Weight as of an earlier encounter on 01/26/22:  68.9 kg (152 lb).  LABS: CBC:    Component Value Date/Time   WBC 58.5 (HH) 01/26/2022 1008   HGB 10.2 (L) 01/26/2022 1008   HCT 32.6 (L) 01/26/2022 1008   PLT 255 01/26/2022 1008   MCV 95.9 01/26/2022 1008   NEUTROABS 4.5 01/26/2022 1008   LYMPHSABS 52.8 (H) 01/26/2022 1008   MONOABS 1.0 01/26/2022 1008   EOSABS 0.1 01/26/2022 1008   BASOSABS 0.0 01/26/2022 1008   Comprehensive Metabolic Panel:    Component Value Date/Time   NA 133 (L) 01/26/2022 1008   K 3.4 (L) 01/26/2022 1008   CL 101 01/26/2022 1008   CO2 26 01/26/2022 1008   BUN 10 01/26/2022 1008   CREATININE 0.87 01/26/2022 1008   GLUCOSE 91 01/26/2022 1008   CALCIUM 9.3 01/26/2022 1008   AST 11 (L) 01/26/2022 1008   ALT 10  01/26/2022 1008   ALKPHOS 84 01/26/2022 1008   BILITOT 0.6 01/26/2022 1008   PROT 6.8 01/26/2022 1008   ALBUMIN 3.6 01/26/2022 1008     Present during today's visit: patient and her daughter  Assessment and Plan: Continue acalabrutinib 100mg  bid   Oral Chemotherapy Side Effect/Intolerance:  Headache: patient called on 6/27 to report she was having a headach that was not responding to caffeine. She was okayed to try acetaminophen to help with the headache. Today in clinic she said hat the acetaminophen was not working. MD to prescribe her acetaminophen-codeine Nausea: patient reported nausea that come and goes since starting the acalabrutinib. Her daughter thing is happens after her mom takes the medication. She was prescribed ondansetron by MD and instructed to take it 30-60 mins prior to her acalabrutinib  Oral Chemotherapy Adherence: Patient reports several missed dose but does not know how many. Patient was provided with Medication AM/PM tray to help with adherence  New medications: none reported  Medication Access Issues: No issues, patient fills at [place]  Patient expressed understanding and was in agreement with this plan. She also understands that She can call  clinic at any time with any questions, concerns, or complaints.   Follow-up plan: RTC in 3 weeks  Thank you for allowing me to participate in the care of this very pleasant patient.   Time Total: 15 mins  Visit consisted of counseling and education on dealing with issues of symptom management in the setting of serious and potentially life-threatening illness.Greater than 50%  of this time was spent counseling and coordinating care related to the above assessment and plan.  Signed by: Darl Pikes, PharmD, BCPS, Salley Slaughter, CPP Hematology/Oncology Clinical Pharmacist Practitioner [place]/DB/AP [facility] [phone]  01/27/2022 11:24 AM

## 2022-01-30 ENCOUNTER — Other Ambulatory Visit (HOSPITAL_COMMUNITY): Payer: Self-pay

## 2022-02-02 DIAGNOSIS — R079 Chest pain, unspecified: Secondary | ICD-10-CM | POA: Diagnosis not present

## 2022-02-02 DIAGNOSIS — G4733 Obstructive sleep apnea (adult) (pediatric): Secondary | ICD-10-CM | POA: Diagnosis not present

## 2022-02-02 DIAGNOSIS — E782 Mixed hyperlipidemia: Secondary | ICD-10-CM | POA: Diagnosis not present

## 2022-02-02 DIAGNOSIS — I2510 Atherosclerotic heart disease of native coronary artery without angina pectoris: Secondary | ICD-10-CM | POA: Diagnosis not present

## 2022-02-02 DIAGNOSIS — K219 Gastro-esophageal reflux disease without esophagitis: Secondary | ICD-10-CM | POA: Diagnosis not present

## 2022-02-02 DIAGNOSIS — I340 Nonrheumatic mitral (valve) insufficiency: Secondary | ICD-10-CM | POA: Diagnosis not present

## 2022-02-02 DIAGNOSIS — I351 Nonrheumatic aortic (valve) insufficiency: Secondary | ICD-10-CM | POA: Diagnosis not present

## 2022-02-02 DIAGNOSIS — I739 Peripheral vascular disease, unspecified: Secondary | ICD-10-CM | POA: Diagnosis not present

## 2022-02-02 DIAGNOSIS — F17210 Nicotine dependence, cigarettes, uncomplicated: Secondary | ICD-10-CM | POA: Diagnosis not present

## 2022-02-02 DIAGNOSIS — R0602 Shortness of breath: Secondary | ICD-10-CM | POA: Diagnosis not present

## 2022-02-02 DIAGNOSIS — I10 Essential (primary) hypertension: Secondary | ICD-10-CM | POA: Diagnosis not present

## 2022-02-06 ENCOUNTER — Telehealth: Payer: Self-pay | Admitting: Pharmacist

## 2022-02-06 NOTE — Telephone Encounter (Signed)
Oral Chemotherapy Pharmacist Encounter   Patient called to report that her throat hurt and she "felt like it was closing". She reports not having any trouble with swallowing or breathing. She said this throat issue was going on for 2 days.   She also said she went to see her "heart doctor" because she thought it was a problem with her heart. Per patient, she was told her heart was fine. (Unable to see notes in Epic)  Instructed patient to hold her acalabrutinib and pay attention to whether her issues resolve. She is going to the beach from [place]19-7[place]21. She is already scheduled to see Dr. Tasia Catchings on 02/16/26.    Darl Pikes, PharmD, BCPS, BCOP, CPP Hematology/Oncology Clinical Pharmacist [place]/DB/AP [facility] [phone]  02/06/2022 2:09 PM

## 2022-02-08 ENCOUNTER — Other Ambulatory Visit (HOSPITAL_COMMUNITY): Payer: Self-pay

## 2022-02-08 NOTE — Telephone Encounter (Signed)
Thanks. Will keep appt with MD as scheduled.

## 2022-02-16 ENCOUNTER — Encounter: Payer: Self-pay | Admitting: Oncology

## 2022-02-16 ENCOUNTER — Telehealth: Payer: Self-pay | Admitting: Pharmacy Technician

## 2022-02-16 ENCOUNTER — Inpatient Hospital Stay: Payer: Medicare Other

## 2022-02-16 ENCOUNTER — Other Ambulatory Visit (HOSPITAL_COMMUNITY): Payer: Self-pay

## 2022-02-16 ENCOUNTER — Inpatient Hospital Stay (HOSPITAL_BASED_OUTPATIENT_CLINIC_OR_DEPARTMENT_OTHER): Payer: Medicare Other | Admitting: Oncology

## 2022-02-16 ENCOUNTER — Telehealth: Payer: Self-pay | Admitting: Pharmacist

## 2022-02-16 VITALS — BP 176/86 | HR 46 | Temp 96.9°F | Resp 18 | Wt 148.6 lb

## 2022-02-16 DIAGNOSIS — C9110 Chronic lymphocytic leukemia of B-cell type not having achieved remission: Secondary | ICD-10-CM | POA: Diagnosis not present

## 2022-02-16 DIAGNOSIS — F17210 Nicotine dependence, cigarettes, uncomplicated: Secondary | ICD-10-CM | POA: Diagnosis not present

## 2022-02-16 DIAGNOSIS — Z5111 Encounter for antineoplastic chemotherapy: Secondary | ICD-10-CM

## 2022-02-16 LAB — CBC WITH DIFFERENTIAL/PLATELET
Abs Immature Granulocytes: 0.05 10*3/uL (ref 0.00–0.07)
Basophils Absolute: 0.1 10*3/uL (ref 0.0–0.1)
Basophils Relative: 0 %
Eosinophils Absolute: 0.2 10*3/uL (ref 0.0–0.5)
Eosinophils Relative: 0 %
HCT: 33.6 % — ABNORMAL LOW (ref 36.0–46.0)
Hemoglobin: 10.9 g/dL — ABNORMAL LOW (ref 12.0–15.0)
Immature Granulocytes: 0 %
Lymphocytes Relative: 89 %
Lymphs Abs: 38.1 10*3/uL — ABNORMAL HIGH (ref 0.7–4.0)
MCH: 30.5 pg (ref 26.0–34.0)
MCHC: 32.4 g/dL (ref 30.0–36.0)
MCV: 94.1 fL (ref 80.0–100.0)
Monocytes Absolute: 2.1 10*3/uL — ABNORMAL HIGH (ref 0.1–1.0)
Monocytes Relative: 5 %
Neutro Abs: 2.4 10*3/uL (ref 1.7–7.7)
Neutrophils Relative %: 6 %
Platelets: 215 10*3/uL (ref 150–400)
RBC: 3.57 MIL/uL — ABNORMAL LOW (ref 3.87–5.11)
RDW: 13.5 % (ref 11.5–15.5)
Smear Review: NORMAL
WBC: 43 10*3/uL — ABNORMAL HIGH (ref 4.0–10.5)
nRBC: 0 % (ref 0.0–0.2)

## 2022-02-16 LAB — COMPREHENSIVE METABOLIC PANEL
ALT: 10 U/L (ref 0–44)
AST: 15 U/L (ref 15–41)
Albumin: 3.8 g/dL (ref 3.5–5.0)
Alkaline Phosphatase: 64 U/L (ref 38–126)
Anion gap: 6 (ref 5–15)
BUN: 8 mg/dL (ref 8–23)
CO2: 26 mmol/L (ref 22–32)
Calcium: 9.4 mg/dL (ref 8.9–10.3)
Chloride: 108 mmol/L (ref 98–111)
Creatinine, Ser: 1.06 mg/dL — ABNORMAL HIGH (ref 0.44–1.00)
GFR, Estimated: 54 mL/min — ABNORMAL LOW (ref >60)
Glucose, Bld: 91 mg/dL (ref 70–99)
Potassium: 4.3 mmol/L (ref 3.5–5.1)
Sodium: 140 mmol/L (ref 135–145)
Total Bilirubin: 0.4 mg/dL (ref 0.3–1.2)
Total Protein: 6.6 g/dL (ref 6.5–8.1)

## 2022-02-16 LAB — LACTATE DEHYDROGENASE: LDH: 126 U/L (ref 98–192)

## 2022-02-16 NOTE — Telephone Encounter (Signed)
Oral Oncology Patient Advocate Encounter   Received notification that prior authorization for Brukinsa is required.   PA submitted on 02/16/2022 Key [id number] Status is pending     Lady Deutscher, CPhT-Adv Pharmacy Patient Advocate Specialist [place] Patient Advocate Team Direct Number: [phone]  Fax: [phone]

## 2022-02-16 NOTE — Assessment & Plan Note (Signed)
Treatment plan as listed above

## 2022-02-16 NOTE — Progress Notes (Signed)
Cystoscopy showed Hematology/Oncology Progress note [phone] [phone]         Patient Care Team: Perrin Maltese, MD as PCP - General (Internal Medicine) Earlie Server, MD as Consulting Physician (Hematology and Oncology)  ASSESSMENT & PLAN:   CLL (chronic lymphocytic leukemia) [name] Did not tolerate acalabrutinib due to possible allergic reaction. Labs reviewed and discussed with patient Recommend to switch to zanubrutinib 160 mg twice daily.  We will check insurance coverage  Encounter for antineoplastic chemotherapy Treatment plan as listed above No orders of the defined types were placed in this encounter.  Follow-up  To be determined.  I plan to see patient back after 2 weeks of start of his  Zanubrutinib . All questions were answered. The patient knows to call the clinic with any problems, questions or concerns.  Earlie Server, MD, PhD [facility] Hematology Oncology 02/16/2022     REASON FOR VISIT Follow up for treatment of CLL  HISTORY OF PRESENTING ILLNESS:  Karen [name] is a  76 y.o.  female with PMH listed below who presents for follow-up of CLL treatments. Oncology history summary listed as below. Oncology History  CLL (chronic lymphocytic leukemia) ([facility])  09/24/2015 Imaging   10/14/2015 chest without contrast Images were independently reviewed by me Small pulmonary nodules are stable over the past year.  Considered benign.  Several lymph nodes are borderline enlarged, including the lower right paratracheal lymph node and a portacaval lymph node. 08/27/2017 US abdomen complete, no hepato-splenomegaly.   12/18/2017 Initial Diagnosis   CLL (chronic lymphocytic leukemia)   07/30/2017, peripheral blood flow cytometry showed CD5+, CD23+, CD38 negative monoclonal B-cell population consistent with a diagnosis of CLL   06/27/2019 Cancer Staging   Staging form: Chronic Lymphocytic Leukemia / Small Lymphocytic Lymphoma, AJCC 8th Edition - Clinical:  Modified Rai Stage I (Modified Rai risk: Intermediate, Lymphocytosis: Present, Adenopathy: Present, Organomegaly: Absent, Anemia: Absent, Thrombocytopenia: Absent) - Signed by Earlie Server, MD on 12/06/2021   11/27/2021 Imaging   Outside facility CT Aorta and run off w wo  showed moderate atherosclerotic aortic disease.  Diffuse lightest of moderate disease of the bilateral [place] vasculature.Diffuse mild stenotic disease of the bilateral SFAs. In addition, patient has soft tissue mass/lymph node along the left pelvic sidewall measures 4.9 x 4.7 cm, numerous peritoneum subcu tissue nodule, a deep right pelvic nodule and numerous enlarged mesenteric and retroperitoneal lymph nodes are identified.  For example a right [place] mesenteric lymph node measures 2.4 x 2.2 cm.  The left periaortic lymph node measures 4.4 x 2.0 cm.  A pericardial lymph node measures 2.6 x 1.3 cm.   01/03/2022 Pathology Results   Right abdomen lymph node biopsy showed chronic lymphocytic leukemia/small lymphocytic lymphoma    01/09/2022 - 02/06/2022 Chemotherapy   Acalabrutinib 100 mg twice daily  Stopped due to sensation of throat closing.    Patient was taking Acalabrutinib 100 mg twice daily she developed sensation of throat closing and was advised to stop acalabrutinib. No other side effects.   Review of Systems  Constitutional:  Positive for fatigue. Negative for appetite change, chills, fever and unexpected weight change.  HENT:   Negative for hearing loss and voice change.   Eyes:  Negative for eye problems.  Respiratory:  Negative for chest tightness and cough.   Cardiovascular:  Negative for chest pain.  Gastrointestinal:  Negative for abdominal distention, abdominal pain, blood in stool and nausea.  Endocrine: Negative for hot flashes.  Genitourinary:  Negative for difficulty urinating and  frequency.   Musculoskeletal:  Positive for back pain. Negative for arthralgias.  Skin:  Negative for itching and rash.   Neurological:  Negative for extremity weakness and headaches.  Hematological:  Negative for adenopathy.  Psychiatric/Behavioral:  Negative for confusion.      MEDICAL HISTORY:  Past Medical History:  Diagnosis Date   Anxiety    Artery occlusion    Asthma    Back pain    Bronchitis    Cancer (HCC)    Skin Cancer   CLL (chronic lymphocytic leukemia) (HCC)    Coronary artery disease    Depression    Dyspnea    GERD (gastroesophageal reflux disease)    History of kidney stones    Hypercholesteremia    Hyperlipemia    Hypertension    Hypothyroidism    Myocardial infarction ([place]) 1999   Neuropathy    Restless leg syndrome    Sleep apnea    use C-PAP   Wears dentures    full upper, partial lower.  doesn't currently wear    SURGICAL HISTORY: Past Surgical History:  Procedure Laterality Date   COLONOSCOPY WITH PROPOFOL N/A 11/20/2016   Procedure: COLONOSCOPY WITH PROPOFOL;  Surgeon: Lucilla Lame, MD;  Location: [place];  Service: Endoscopy;  Laterality: N/A;  sleep apnea   CORONARY ANGIOPLASTY WITH STENT PLACEMENT  2008   DILATION AND CURETTAGE OF UTERUS     EYE SURGERY Bilateral    Cataract Extraction with IOL   HAND SURGERY Left 12/2017   upper hand fracture repair   LEFT HEART CATH AND CORONARY ANGIOGRAPHY N/A 06/29/2017   Procedure: LEFT HEART CATH AND CORONARY ANGIOGRAPHY;  Surgeon: Dionisio David, MD;  Location: [place] [facility];  Service: Cardiovascular;  Laterality: N/A;   LEFT HEART CATH AND CORONARY ANGIOGRAPHY Left 12/02/2018   Procedure: LEFT HEART CATH AND CORONARY ANGIOGRAPHY;  Surgeon: Dionisio David, MD;  Location: [place] [facility];  Service: Cardiovascular;  Laterality: Left;   OPEN REDUCTION INTERNAL FIXATION (ORIF) DISTAL RADIAL FRACTURE Right 08/12/2018   Procedure: OPEN REDUCTION INTERNAL FIXATION (ORIF) DISTAL RADIAL FRACTURE;  Surgeon: [name], MD;  Location: [facility];  Service: Orthopedics;  Laterality: Right;   POLYPECTOMY   11/20/2016   Procedure: POLYPECTOMY;  Surgeon: Lucilla Lame, MD;  Location: [place];  Service: Endoscopy;;   TUBAL LIGATION     VAGINAL HYSTERECTOMY     VULVECTOMY Right 01/31/2016   Procedure: WIDE EXCISION VULVECTOMY-RIGHT LABIA;  Surgeon: Brayton Mars, MD;  Location: [facility];  Service: Gynecology;  Laterality: Right;    SOCIAL HISTORY: Social History   Socioeconomic History   Marital status: Single    Spouse name: Not on file   Number of children: 5   Years of education: Not on file   Highest education level: Not on file  Occupational History   Occupation: part time job    Comment: Engineer, building services  Tobacco Use   Smoking status: Every Day    Packs/day: 1.00    Years: 35.00    Total pack years: 35.00    Types: Cigarettes   Smokeless tobacco: Current    Types: Snuff  Vaping Use   Vaping Use: Never used  Substance and Sexual Activity   Alcohol use: No    Alcohol/week: 0.0 standard drinks of alcohol   Drug use: No   Sexual activity: Not Currently    Partners: Male    Birth control/protection: Surgical  Other Topics Concern   Not on  file  Social History Narrative   Not on file   Social Determinants of Health   Financial Resource Strain: Low Risk  (12/02/2018)   Overall Financial Resource Strain (CARDIA)    Difficulty of Paying Living Expenses: Not very hard  Food Insecurity: No Food Insecurity (12/02/2018)   Hunger Vital Sign    Worried About Running Out of Food in the Last Year: Never true    Ran Out of Food in the Last Year: Never true  Transportation Needs: No Transportation Needs (12/02/2018)   PRAPARE - Hydrologist (Medical): No    Lack of Transportation (Non-Medical): No  Physical Activity: Inactive (12/18/2017)   Exercise Vital Sign    Days of Exercise per Week: 0 days    Minutes of Exercise per Session: 0 min  Stress: No Stress Concern Present (12/02/2018)   [place]    Feeling of Stress : Only a little  Social Connections: Unknown (12/02/2018)   Social Connection and Isolation Panel [NHANES]    Frequency of Communication with Friends and Family: More than three times a week    Frequency of Social Gatherings with Friends and Family: Not on file    Attends Religious Services: Not on file    Active Member of Clubs or Organizations: Not on file    Attends Archivist Meetings: Not on file    Marital Status: Not on file  Intimate Partner Violence: Not At Risk (12/02/2018)   Humiliation, Afraid, Rape, and Kick questionnaire    Fear of Current or Ex-Partner: No    Emotionally Abused: No    Physically Abused: No    Sexually Abused: No    FAMILY HISTORY: Family History  Problem Relation Age of Onset   Heart disease Father    Stroke Mother    Hypertension Mother    Lung cancer Brother    Heart attack Brother    Congestive Heart Failure Sister    Cervical cancer Sister    Cancer Neg Hx    Diabetes Neg Hx    Breast cancer Neg Hx     ALLERGIES:  is allergic to requip [ropinirole hcl], celebrex [celecoxib], meloxicam, and nitrofurantoin.  MEDICATIONS:  Current Outpatient Medications  Medication Sig Dispense Refill   acalabrutinib maleate (CALQUENCE) 100 MG TABS Take 1 tablet by mouth 2 (two) times daily. 60 tablet 2   acetaminophen (TYLENOL) 500 MG tablet Take 500-1,000 mg by mouth every 6 (six) hours as needed for moderate pain or fever.     acetaminophen-codeine (TYLENOL #3) 300-30 MG tablet Take 1 tablet by mouth every 12 (twelve) hours as needed for moderate pain or severe pain. 30 tablet 0   albuterol (VENTOLIN HFA) 108 (90 Base) MCG/ACT inhaler Inhale 1-2 puffs into the lungs every 6 (six) hours as needed for wheezing or shortness of breath.     amLODipine (NORVASC) 5 MG tablet Take 5 mg by mouth daily. 1 Tablet(s) By Mouth Daily     aspirin EC 81 MG tablet Take 1 tablet (81 mg total) by mouth daily. 30 tablet     atorvastatin (LIPITOR) 40 MG tablet Take 1 tablet (40 mg total) by mouth daily. 30 tablet 2   clopidogrel (PLAVIX) 75 MG tablet Take 75 mg by mouth daily.      dexlansoprazole (DEXILANT) 60 MG capsule Take 60 mg by mouth daily.      isosorbide mononitrate (IMDUR) 60 MG 24 hr tablet  Take 60 mg by mouth daily.     lisinopril (ZESTRIL) 40 MG tablet Hold until followup with outpatient provider due to acute kidney injury.     metoprolol succinate (TOPROL-XL) 25 MG 24 hr tablet Take 25 mg by mouth daily.      ondansetron (ZOFRAN) 4 MG tablet Take 1 tablet (4 mg total) by mouth every 8 (eight) hours as needed for nausea or vomiting. 60 tablet 1   PREMARIN vaginal cream USE 1 GRAM INTRAVAGINALLY TWICE PER WEEK 30 g 3   XARELTO 2.5 MG TABS tablet Take 2.5 mg by mouth daily.     cilostazol (PLETAL) 100 MG tablet Take 100 mg by mouth 2 (two) times daily. (Patient not taking: Reported on 01/03/2022)     gabapentin (NEURONTIN) 400 MG capsule Take 400 mg by mouth 3 (three) times daily. (Patient not taking: Reported on 02/16/2022)     No current facility-administered medications for this visit.     PHYSICAL EXAMINATION: ECOG PERFORMANCE STATUS: 1 - Symptomatic but completely ambulatory Vitals:   02/16/22 1104  BP: (!) 176/86  Pulse: (!) 46  Resp: 18  Temp: (!) 96.9 F (36.1 C)   Filed Weights   02/16/22 1104  Weight: 148 lb 9.6 oz (67.4 kg)    Physical Exam Constitutional:      General: She is not in acute distress.    Appearance: She is not diaphoretic.  HENT:     Head: Normocephalic and atraumatic.     Nose: Nose normal.     Mouth/Throat:     Pharynx: No oropharyngeal exudate.  Eyes:     General: No scleral icterus.       Left eye: No discharge.     Conjunctiva/sclera: Conjunctivae normal.     Pupils: Pupils are equal, round, and reactive to light.  Neck:     Vascular: No JVD.  Cardiovascular:     Rate and Rhythm: Normal rate and regular rhythm.     Heart sounds: No murmur  heard. Pulmonary:     Effort: Pulmonary effort is normal. No respiratory distress.     Breath sounds: No rales.  Chest:     Chest wall: No tenderness.  Abdominal:     General: Bowel sounds are normal. There is no distension.     Palpations: Abdomen is soft.     Tenderness: There is no abdominal tenderness.  Musculoskeletal:        General: Normal range of motion.     Cervical back: Normal range of motion and neck supple.  Lymphadenopathy:     Cervical: No cervical adenopathy.  Skin:    General: Skin is warm and dry.     Findings: No erythema.  Neurological:     Mental Status: She is alert and oriented to person, place, and time.     Cranial Nerves: No cranial nerve deficit.     Motor: No abnormal muscle tone.     Coordination: Coordination normal.  Psychiatric:        Mood and Affect: Affect normal.        Judgment: Judgment normal.      LABORATORY DATA:  I have reviewed the data as listed Lab Results  Component Value Date   WBC 43.0 (H) 02/16/2022   HGB 10.9 (L) 02/16/2022   HCT 33.6 (L) 02/16/2022   MCV 94.1 02/16/2022   PLT 215 02/16/2022

## 2022-02-16 NOTE — Progress Notes (Signed)
Patient here for follow up. Reports throat discomfort every time she took Leola. She has been off of it for a little over a week

## 2022-02-16 NOTE — Assessment & Plan Note (Signed)
Did not tolerate acalabrutinib due to possible allergic reaction. Labs reviewed and discussed with patient Recommend to switch to zanubrutinib 160 mg twice daily.  We will check insurance coverage

## 2022-02-16 NOTE — Telephone Encounter (Signed)
Oral Oncology Patient Advocate Encounter  Prior Authorization for Brukinsa has been approved.    PA# [id number] Effective dates: 02/16/2022 through 07/23/2022  Patients co-pay is $0.    Lady Deutscher, CPhT-Adv Pharmacy Patient [place] Patient Advocate Team Direct Number: [phone]  Fax: [phone]

## 2022-02-20 ENCOUNTER — Other Ambulatory Visit (HOSPITAL_COMMUNITY): Payer: Self-pay

## 2022-02-20 ENCOUNTER — Inpatient Hospital Stay: Payer: Medicare Other | Admitting: Oncology

## 2022-02-20 ENCOUNTER — Inpatient Hospital Stay: Payer: Medicare Other

## 2022-02-20 MED ORDER — ZANUBRUTINIB 80 MG PO CAPS
160.0000 mg | ORAL_CAPSULE | Freq: Two times a day (BID) | ORAL | 1 refills | Status: DC
  Filled 2022-02-20 – 2022-02-21 (×2): qty 120, 30d supply, fill #0

## 2022-02-20 NOTE — Telephone Encounter (Unsigned)
Oral Chemotherapy Pharmacist Encounter  Patient Education I spoke with patient for overview of new oral chemotherapy medication: zanubrutinib for the treatment of CLL, planned duration until disease progression or unacceptable drug toxicity.   Pt is doing well. Counseled patient on administration, dosing, side effects, monitoring, drug-food interactions, safe handling, storage, and disposal. Patient will take 2 tablets twice daily.  Side effects include but not limited to:  Risk of infection Risk of bleeding    Rash/itchy skin Fatigue   Reviewed with patient importance of keeping a medication schedule and plan for any missed doses.  After discussion with patient, there are no patient barriers to medication adherence identified.   Ms. Sebek voiced understanding and appreciation. All questions answered. Medication handout provided.  Provided patient with [facility] phone number. Patient knows to call the office with questions or concerns. Oral Chemotherapy Navigation Clinic will continue to follow.  Laray Anger, PharmD PGY-2 Pharmacy Resident Hematology/Oncology [phone]  02/20/2022 3:56 PM

## 2022-02-20 NOTE — Telephone Encounter (Signed)
Oral Oncology Pharmacist Encounter  Received new prescription for  Brukinsa (zanubrutinib) for the treatment of CLL with a planned duration until disease progression or unacceptable drug toxicity. Patient is transitioning from treatment with acalabrutinib due to possible allergic reaction.   CMP and CBC from 02/16/2022 assessed, no relevant lab abnormalities noted. Prescription dose and frequency assessed.   Current medication list in Epic reviewed, few DDIs with zanubrutinib identified: Pt has increased risk of low platelet count due to medication. Platelet levels will be monitored.  Evaluated chart and no patient barriers to medication adherence identified.   Prescription has been e-scribed to the [facility] for benefits analysis and approval.  Oral Oncology Clinic will continue to follow for insurance authorization, copayment issues, initial counseling and start date.  Patient agreed to treatment on 02/16/2022 per MD documentation.  Laray Anger, PharmD PGY-2 Pharmacy Resident Hematology/Oncology [phone]  02/20/2022 10:54 AM

## 2022-02-21 ENCOUNTER — Other Ambulatory Visit (HOSPITAL_COMMUNITY): Payer: Self-pay

## 2022-02-21 NOTE — Telephone Encounter (Signed)
Oral Chemotherapy Pharmacist Encounter   Ms. Ruppert is scheduled for a colonoscopy on 03/01/2022. She was advised to start zanubrutinib on 03/04/2022 due to potential bleeding risk. Follow-up will be scheduled for about 2 weeks after starting zanubrutinib. She voiced understanding.    Laray Anger, PharmD PGY-2 Pharmacy Resident Hematology/Oncology [phone]  02/21/2022 10:38 AM

## 2022-03-01 ENCOUNTER — Encounter: Payer: Self-pay | Admitting: Anesthesiology

## 2022-03-01 ENCOUNTER — Ambulatory Visit: Admission: RE | Admit: 2022-03-01 | Payer: Medicare Other | Source: Ambulatory Visit | Admitting: Internal Medicine

## 2022-03-01 ENCOUNTER — Encounter: Admission: RE | Payer: Self-pay | Source: Ambulatory Visit

## 2022-03-01 SURGERY — COLONOSCOPY WITH PROPOFOL
Anesthesia: General

## 2022-03-08 ENCOUNTER — Emergency Department: Payer: Medicare Other

## 2022-03-08 ENCOUNTER — Encounter: Payer: Self-pay | Admitting: Emergency Medicine

## 2022-03-08 ENCOUNTER — Other Ambulatory Visit: Payer: Self-pay

## 2022-03-08 DIAGNOSIS — I480 Paroxysmal atrial fibrillation: Secondary | ICD-10-CM | POA: Diagnosis present

## 2022-03-08 DIAGNOSIS — C9110 Chronic lymphocytic leukemia of B-cell type not having achieved remission: Secondary | ICD-10-CM | POA: Diagnosis present

## 2022-03-08 DIAGNOSIS — Z961 Presence of intraocular lens: Secondary | ICD-10-CM | POA: Diagnosis present

## 2022-03-08 DIAGNOSIS — K2100 Gastro-esophageal reflux disease with esophagitis, without bleeding: Secondary | ICD-10-CM | POA: Diagnosis not present

## 2022-03-08 DIAGNOSIS — R0789 Other chest pain: Secondary | ICD-10-CM | POA: Diagnosis not present

## 2022-03-08 DIAGNOSIS — Z888 Allergy status to other drugs, medicaments and biological substances status: Secondary | ICD-10-CM

## 2022-03-08 DIAGNOSIS — R1319 Other dysphagia: Secondary | ICD-10-CM | POA: Diagnosis not present

## 2022-03-08 DIAGNOSIS — F17210 Nicotine dependence, cigarettes, uncomplicated: Secondary | ICD-10-CM | POA: Diagnosis not present

## 2022-03-08 DIAGNOSIS — G2581 Restless legs syndrome: Secondary | ICD-10-CM | POA: Diagnosis present

## 2022-03-08 DIAGNOSIS — I2510 Atherosclerotic heart disease of native coronary artery without angina pectoris: Secondary | ICD-10-CM | POA: Diagnosis not present

## 2022-03-08 DIAGNOSIS — K769 Liver disease, unspecified: Secondary | ICD-10-CM | POA: Diagnosis not present

## 2022-03-08 DIAGNOSIS — F419 Anxiety disorder, unspecified: Secondary | ICD-10-CM | POA: Diagnosis present

## 2022-03-08 DIAGNOSIS — Z7902 Long term (current) use of antithrombotics/antiplatelets: Secondary | ICD-10-CM | POA: Diagnosis not present

## 2022-03-08 DIAGNOSIS — I252 Old myocardial infarction: Secondary | ICD-10-CM | POA: Diagnosis not present

## 2022-03-08 DIAGNOSIS — F32A Depression, unspecified: Secondary | ICD-10-CM | POA: Diagnosis present

## 2022-03-08 DIAGNOSIS — R1013 Epigastric pain: Secondary | ICD-10-CM | POA: Diagnosis not present

## 2022-03-08 DIAGNOSIS — I10 Essential (primary) hypertension: Secondary | ICD-10-CM | POA: Diagnosis not present

## 2022-03-08 DIAGNOSIS — Z7982 Long term (current) use of aspirin: Secondary | ICD-10-CM

## 2022-03-08 DIAGNOSIS — Z90710 Acquired absence of both cervix and uterus: Secondary | ICD-10-CM | POA: Diagnosis not present

## 2022-03-08 DIAGNOSIS — Z716 Tobacco abuse counseling: Secondary | ICD-10-CM

## 2022-03-08 DIAGNOSIS — G629 Polyneuropathy, unspecified: Secondary | ICD-10-CM | POA: Diagnosis present

## 2022-03-08 DIAGNOSIS — R079 Chest pain, unspecified: Secondary | ICD-10-CM | POA: Diagnosis not present

## 2022-03-08 DIAGNOSIS — I160 Hypertensive urgency: Principal | ICD-10-CM | POA: Diagnosis present

## 2022-03-08 DIAGNOSIS — Z7901 Long term (current) use of anticoagulants: Secondary | ICD-10-CM | POA: Diagnosis not present

## 2022-03-08 DIAGNOSIS — K209 Esophagitis, unspecified without bleeding: Secondary | ICD-10-CM | POA: Diagnosis not present

## 2022-03-08 DIAGNOSIS — R1310 Dysphagia, unspecified: Secondary | ICD-10-CM | POA: Diagnosis not present

## 2022-03-08 DIAGNOSIS — Z8249 Family history of ischemic heart disease and other diseases of the circulatory system: Secondary | ICD-10-CM | POA: Diagnosis not present

## 2022-03-08 DIAGNOSIS — E039 Hypothyroidism, unspecified: Secondary | ICD-10-CM | POA: Diagnosis present

## 2022-03-08 DIAGNOSIS — Z79891 Long term (current) use of opiate analgesic: Secondary | ICD-10-CM

## 2022-03-08 DIAGNOSIS — Z79899 Other long term (current) drug therapy: Secondary | ICD-10-CM

## 2022-03-08 DIAGNOSIS — Z823 Family history of stroke: Secondary | ICD-10-CM

## 2022-03-08 DIAGNOSIS — Z8673 Personal history of transient ischemic attack (TIA), and cerebral infarction without residual deficits: Secondary | ICD-10-CM

## 2022-03-08 DIAGNOSIS — Z955 Presence of coronary angioplasty implant and graft: Secondary | ICD-10-CM

## 2022-03-08 DIAGNOSIS — Z9221 Personal history of antineoplastic chemotherapy: Secondary | ICD-10-CM

## 2022-03-08 DIAGNOSIS — G4730 Sleep apnea, unspecified: Secondary | ICD-10-CM | POA: Diagnosis present

## 2022-03-08 DIAGNOSIS — E7800 Pure hypercholesterolemia, unspecified: Secondary | ICD-10-CM | POA: Diagnosis present

## 2022-03-08 DIAGNOSIS — R1314 Dysphagia, pharyngoesophageal phase: Secondary | ICD-10-CM | POA: Diagnosis present

## 2022-03-08 DIAGNOSIS — Z85828 Personal history of other malignant neoplasm of skin: Secondary | ICD-10-CM | POA: Diagnosis not present

## 2022-03-08 DIAGNOSIS — R918 Other nonspecific abnormal finding of lung field: Secondary | ICD-10-CM | POA: Diagnosis not present

## 2022-03-08 DIAGNOSIS — I25119 Atherosclerotic heart disease of native coronary artery with unspecified angina pectoris: Secondary | ICD-10-CM | POA: Diagnosis not present

## 2022-03-08 DIAGNOSIS — Z720 Tobacco use: Secondary | ICD-10-CM | POA: Diagnosis not present

## 2022-03-08 DIAGNOSIS — N2889 Other specified disorders of kidney and ureter: Secondary | ICD-10-CM | POA: Diagnosis not present

## 2022-03-08 DIAGNOSIS — F320 Major depressive disorder, single episode, mild: Secondary | ICD-10-CM | POA: Diagnosis not present

## 2022-03-08 LAB — CBC
HCT: 38 % (ref 36.0–46.0)
Hemoglobin: 11.9 g/dL — ABNORMAL LOW (ref 12.0–15.0)
MCH: 29.2 pg (ref 26.0–34.0)
MCHC: 31.3 g/dL (ref 30.0–36.0)
MCV: 93.1 fL (ref 80.0–100.0)
Platelets: 172 10*3/uL (ref 150–400)
RBC: 4.08 MIL/uL (ref 3.87–5.11)
RDW: 14 % (ref 11.5–15.5)
WBC: 52.4 10*3/uL (ref 4.0–10.5)
nRBC: 0 % (ref 0.0–0.2)

## 2022-03-08 LAB — BASIC METABOLIC PANEL
Anion gap: 7 (ref 5–15)
BUN: 12 mg/dL (ref 8–23)
CO2: 27 mmol/L (ref 22–32)
Calcium: 9.9 mg/dL (ref 8.9–10.3)
Chloride: 103 mmol/L (ref 98–111)
Creatinine, Ser: 0.97 mg/dL (ref 0.44–1.00)
GFR, Estimated: >60 mL/min (ref >60)
Glucose, Bld: 97 mg/dL (ref 70–99)
Potassium: 3.9 mmol/L (ref 3.5–5.1)
Sodium: 137 mmol/L (ref 135–145)

## 2022-03-08 LAB — TROPONIN I (HIGH SENSITIVITY): Troponin I (High Sensitivity): 7 ng/L (ref <18)

## 2022-03-08 NOTE — ED Triage Notes (Signed)
Pt reports just started chemo medicine on sunday

## 2022-03-08 NOTE — ED Triage Notes (Signed)
Pt presents via POV with trouble swallowing and painful swallowing. Pt reports pain radiates to epigastric area. Pt reports history of leukemia- is actively taking chemotherapy pills. Pt denies lower abdominal pain, N/V/D.

## 2022-03-09 ENCOUNTER — Encounter: Payer: Self-pay | Admitting: Internal Medicine

## 2022-03-09 ENCOUNTER — Emergency Department: Payer: Medicare Other

## 2022-03-09 ENCOUNTER — Other Ambulatory Visit: Payer: Self-pay

## 2022-03-09 ENCOUNTER — Encounter
Admission: EM | Disposition: A | Discharge status: Home / Self Care | Payer: Self-pay | Source: Home / Self Care | Attending: Internal Medicine

## 2022-03-09 ENCOUNTER — Inpatient Hospital Stay
Admission: EM | Admit: 2022-03-09 | Discharge: 2022-03-10 | DRG: 305 | Disposition: A | Discharge status: Home / Self Care | Payer: Medicare Other | Attending: Internal Medicine | Admitting: Internal Medicine

## 2022-03-09 ENCOUNTER — Inpatient Hospital Stay: Payer: Medicare Other | Admitting: General Practice

## 2022-03-09 DIAGNOSIS — F419 Anxiety disorder, unspecified: Secondary | ICD-10-CM | POA: Diagnosis present

## 2022-03-09 DIAGNOSIS — E7800 Pure hypercholesterolemia, unspecified: Secondary | ICD-10-CM | POA: Diagnosis present

## 2022-03-09 DIAGNOSIS — I10 Essential (primary) hypertension: Secondary | ICD-10-CM | POA: Diagnosis present

## 2022-03-09 DIAGNOSIS — Z79899 Other long term (current) drug therapy: Secondary | ICD-10-CM | POA: Diagnosis not present

## 2022-03-09 DIAGNOSIS — I252 Old myocardial infarction: Secondary | ICD-10-CM | POA: Diagnosis not present

## 2022-03-09 DIAGNOSIS — I2510 Atherosclerotic heart disease of native coronary artery without angina pectoris: Secondary | ICD-10-CM | POA: Diagnosis present

## 2022-03-09 DIAGNOSIS — K2100 Gastro-esophageal reflux disease with esophagitis, without bleeding: Secondary | ICD-10-CM | POA: Diagnosis present

## 2022-03-09 DIAGNOSIS — Z85828 Personal history of other malignant neoplasm of skin: Secondary | ICD-10-CM | POA: Diagnosis not present

## 2022-03-09 DIAGNOSIS — R1319 Other dysphagia: Secondary | ICD-10-CM | POA: Diagnosis not present

## 2022-03-09 DIAGNOSIS — G4730 Sleep apnea, unspecified: Secondary | ICD-10-CM | POA: Diagnosis present

## 2022-03-09 DIAGNOSIS — F32A Depression, unspecified: Secondary | ICD-10-CM | POA: Diagnosis present

## 2022-03-09 DIAGNOSIS — Z955 Presence of coronary angioplasty implant and graft: Secondary | ICD-10-CM | POA: Diagnosis not present

## 2022-03-09 DIAGNOSIS — I160 Hypertensive urgency: Secondary | ICD-10-CM | POA: Diagnosis present

## 2022-03-09 DIAGNOSIS — I480 Paroxysmal atrial fibrillation: Secondary | ICD-10-CM | POA: Diagnosis present

## 2022-03-09 DIAGNOSIS — R1310 Dysphagia, unspecified: Secondary | ICD-10-CM | POA: Diagnosis present

## 2022-03-09 DIAGNOSIS — R1314 Dysphagia, pharyngoesophageal phase: Secondary | ICD-10-CM | POA: Diagnosis present

## 2022-03-09 DIAGNOSIS — Z8673 Personal history of transient ischemic attack (TIA), and cerebral infarction without residual deficits: Secondary | ICD-10-CM | POA: Diagnosis not present

## 2022-03-09 DIAGNOSIS — G2581 Restless legs syndrome: Secondary | ICD-10-CM | POA: Diagnosis present

## 2022-03-09 DIAGNOSIS — G629 Polyneuropathy, unspecified: Secondary | ICD-10-CM | POA: Diagnosis present

## 2022-03-09 DIAGNOSIS — R079 Chest pain, unspecified: Secondary | ICD-10-CM | POA: Diagnosis not present

## 2022-03-09 DIAGNOSIS — F17210 Nicotine dependence, cigarettes, uncomplicated: Secondary | ICD-10-CM | POA: Diagnosis present

## 2022-03-09 DIAGNOSIS — Z90710 Acquired absence of both cervix and uterus: Secondary | ICD-10-CM | POA: Diagnosis not present

## 2022-03-09 DIAGNOSIS — C9110 Chronic lymphocytic leukemia of B-cell type not having achieved remission: Secondary | ICD-10-CM | POA: Diagnosis present

## 2022-03-09 DIAGNOSIS — E039 Hypothyroidism, unspecified: Secondary | ICD-10-CM | POA: Diagnosis present

## 2022-03-09 DIAGNOSIS — Z720 Tobacco use: Secondary | ICD-10-CM | POA: Diagnosis present

## 2022-03-09 DIAGNOSIS — Z7982 Long term (current) use of aspirin: Secondary | ICD-10-CM | POA: Diagnosis not present

## 2022-03-09 DIAGNOSIS — Z7902 Long term (current) use of antithrombotics/antiplatelets: Secondary | ICD-10-CM | POA: Diagnosis not present

## 2022-03-09 DIAGNOSIS — Z8249 Family history of ischemic heart disease and other diseases of the circulatory system: Secondary | ICD-10-CM | POA: Diagnosis not present

## 2022-03-09 DIAGNOSIS — F320 Major depressive disorder, single episode, mild: Secondary | ICD-10-CM | POA: Diagnosis not present

## 2022-03-09 DIAGNOSIS — Z7901 Long term (current) use of anticoagulants: Secondary | ICD-10-CM | POA: Diagnosis not present

## 2022-03-09 HISTORY — PX: ESOPHAGOGASTRODUODENOSCOPY (EGD) WITH PROPOFOL: SHX5813

## 2022-03-09 LAB — HEPATIC FUNCTION PANEL
ALT: 10 U/L (ref 0–44)
AST: 15 U/L (ref 15–41)
Albumin: 4.1 g/dL (ref 3.5–5.0)
Alkaline Phosphatase: 70 U/L (ref 38–126)
Bilirubin, Direct: <0.1 mg/dL (ref 0.0–0.2)
Total Bilirubin: 0.4 mg/dL (ref 0.3–1.2)
Total Protein: 6.9 g/dL (ref 6.5–8.1)

## 2022-03-09 LAB — TROPONIN I (HIGH SENSITIVITY): Troponin I (High Sensitivity): 8 ng/L (ref <18)

## 2022-03-09 LAB — LIPASE, BLOOD: Lipase: 48 U/L (ref 11–51)

## 2022-03-09 SURGERY — ESOPHAGOGASTRODUODENOSCOPY (EGD) WITH PROPOFOL
Anesthesia: General

## 2022-03-09 MED ORDER — METOPROLOL SUCCINATE ER 50 MG PO TB24
100.0000 mg | ORAL_TABLET | Freq: Every day | ORAL | Status: DC
  Administered 2022-03-09 – 2022-03-10 (×2): 100 mg via ORAL
  Filled 2022-03-09 (×2): qty 2

## 2022-03-09 MED ORDER — PANTOPRAZOLE SODIUM 40 MG IV SOLR: 40.0000 mg | INTRAVENOUS | Status: DC

## 2022-03-09 MED ORDER — AMLODIPINE BESYLATE 5 MG PO TABS
5.0000 mg | ORAL_TABLET | Freq: Once | ORAL | Status: AC
Start: 2022-03-09 — End: 2022-03-09
  Administered 2022-03-09: 5 mg via ORAL
  Filled 2022-03-09: qty 1

## 2022-03-09 MED ORDER — FUROSEMIDE 40 MG PO TABS: 20.0000 mg | ORAL_TABLET | Freq: Every day | ORAL | Status: DC

## 2022-03-09 MED ORDER — NICARDIPINE HCL IN NACL 20-0.86 MG/200ML-% IV SOLN
3.0000 mg/h | INTRAVENOUS | Status: DC
  Administered 2022-03-09: 5 mg/h via INTRAVENOUS
  Filled 2022-03-09 (×2): qty 200

## 2022-03-09 MED ORDER — ATORVASTATIN CALCIUM 20 MG PO TABS
40.0000 mg | ORAL_TABLET | Freq: Every day | ORAL | Status: DC
  Administered 2022-03-09 – 2022-03-10 (×2): 40 mg via ORAL
  Filled 2022-03-09 (×2): qty 2

## 2022-03-09 MED ORDER — LISINOPRIL 10 MG PO TABS
40.0000 mg | ORAL_TABLET | Freq: Every day | ORAL | Status: DC
  Administered 2022-03-10: 40 mg via ORAL
  Filled 2022-03-09: qty 4

## 2022-03-09 MED ORDER — LISINOPRIL 10 MG PO TABS: 40.0000 mg | ORAL_TABLET | Freq: Every day | ORAL | Status: DC

## 2022-03-09 MED ORDER — SODIUM CHLORIDE 0.9% FLUSH: 3.0000 mL | INTRAVENOUS | Status: DC | PRN

## 2022-03-09 MED ORDER — PANTOPRAZOLE SODIUM 40 MG IV SOLR
40.0000 mg | Freq: Two times a day (BID) | INTRAVENOUS | Status: DC
  Administered 2022-03-09 – 2022-03-10 (×2): 40 mg via INTRAVENOUS
  Filled 2022-03-09 (×3): qty 10

## 2022-03-09 MED ORDER — ISOSORBIDE MONONITRATE ER 60 MG PO TB24
120.0000 mg | ORAL_TABLET | Freq: Every day | ORAL | Status: DC
  Administered 2022-03-09 – 2022-03-10 (×2): 120 mg via ORAL
  Filled 2022-03-09 (×2): qty 2

## 2022-03-09 MED ORDER — HYDRALAZINE HCL 20 MG/ML IJ SOLN
10.0000 mg | Freq: Once | INTRAMUSCULAR | Status: AC
  Administered 2022-03-09: 10 mg via INTRAVENOUS
  Filled 2022-03-09: qty 1

## 2022-03-09 MED ORDER — LIDOCAINE VISCOUS HCL 2 % MT SOLN
15.0000 mL | Freq: Once | OROMUCOSAL | Status: AC
  Administered 2022-03-09: 15 mL via OROMUCOSAL
  Filled 2022-03-09: qty 15

## 2022-03-09 MED ORDER — IOHEXOL 350 MG/ML SOLN
100.0000 mL | Freq: Once | INTRAVENOUS | Status: AC | PRN
  Administered 2022-03-09: 100 mL via INTRAVENOUS

## 2022-03-09 MED ORDER — ZANUBRUTINIB 80 MG PO CAPS: 160.0000 mg | ORAL_CAPSULE | Freq: Two times a day (BID) | ORAL | Status: DC

## 2022-03-09 MED ORDER — ACETAMINOPHEN 500 MG PO TABS: 500.0000 mg | ORAL_TABLET | Freq: Four times a day (QID) | ORAL | Status: DC | PRN

## 2022-03-09 MED ORDER — ESTROGENS CONJUGATED 0.625 MG/GM VA CREA
1.0000 | TOPICAL_CREAM | VAGINAL | Status: DC
  Administered 2022-03-09: 1 via VAGINAL
  Filled 2022-03-09: qty 30

## 2022-03-09 MED ORDER — ONDANSETRON HCL 4 MG PO TABS: 4.0000 mg | ORAL_TABLET | Freq: Four times a day (QID) | ORAL | Status: DC | PRN

## 2022-03-09 MED ORDER — ALBUTEROL SULFATE HFA 108 (90 BASE) MCG/ACT IN AERS: 1.0000 | INHALATION_SPRAY | Freq: Four times a day (QID) | RESPIRATORY_TRACT | Status: DC | PRN

## 2022-03-09 MED ORDER — SODIUM CHLORIDE 0.9% FLUSH
3.0000 mL | Freq: Two times a day (BID) | INTRAVENOUS | Status: DC
  Administered 2022-03-09 – 2022-03-10 (×2): 3 mL via INTRAVENOUS

## 2022-03-09 MED ORDER — LISINOPRIL 10 MG PO TABS
40.0000 mg | ORAL_TABLET | Freq: Once | ORAL | Status: AC
  Administered 2022-03-09: 40 mg via ORAL
  Filled 2022-03-09: qty 4

## 2022-03-09 MED ORDER — FUROSEMIDE 20 MG PO TABS: 20.0000 mg | ORAL_TABLET | Freq: Every day | ORAL | Status: DC

## 2022-03-09 MED ORDER — ACETAMINOPHEN-CODEINE 300-30 MG PO TABS
1.0000 | ORAL_TABLET | Freq: Two times a day (BID) | ORAL | Status: DC | PRN
  Filled 2022-03-09: qty 1

## 2022-03-09 MED ORDER — AMLODIPINE BESYLATE 5 MG PO TABS
5.0000 mg | ORAL_TABLET | Freq: Every day | ORAL | Status: DC
  Administered 2022-03-10: 5 mg via ORAL
  Filled 2022-03-09: qty 1

## 2022-03-09 MED ORDER — ALUM & MAG HYDROXIDE-SIMETH 200-200-20 MG/5ML PO SUSP
30.0000 mL | Freq: Once | ORAL | Status: AC
  Administered 2022-03-09: 30 mL via ORAL
  Filled 2022-03-09: qty 30

## 2022-03-09 MED ORDER — SODIUM CHLORIDE 0.9 % IV BOLUS (SEPSIS)
1000.0000 mL | Freq: Once | INTRAVENOUS | Status: AC
  Administered 2022-03-09: 1000 mL via INTRAVENOUS

## 2022-03-09 MED ORDER — TRAZODONE HCL 50 MG PO TABS
50.0000 mg | ORAL_TABLET | Freq: Every day | ORAL | Status: DC
  Administered 2022-03-09: 50 mg via ORAL
  Filled 2022-03-09: qty 1

## 2022-03-09 MED ORDER — SODIUM CHLORIDE 0.9 % IV SOLN: 250.0000 mL | INTRAVENOUS | Status: DC | PRN

## 2022-03-09 MED ORDER — ONDANSETRON HCL 4 MG/2ML IJ SOLN: 4.0000 mg | Freq: Four times a day (QID) | INTRAMUSCULAR | Status: DC | PRN

## 2022-03-09 MED ORDER — PROPOFOL 10 MG/ML IV BOLUS
INTRAVENOUS | Status: DC | PRN
  Administered 2022-03-09: 20 mg via INTRAVENOUS
  Administered 2022-03-09: 60 mg via INTRAVENOUS

## 2022-03-09 MED ORDER — METOPROLOL SUCCINATE ER 50 MG PO TB24
25.0000 mg | ORAL_TABLET | Freq: Once | ORAL | Status: DC
  Filled 2022-03-09: qty 1

## 2022-03-09 MED ORDER — ALBUTEROL SULFATE (2.5 MG/3ML) 0.083% IN NEBU: 2.5000 mg | INHALATION_SOLUTION | Freq: Four times a day (QID) | RESPIRATORY_TRACT | Status: DC | PRN

## 2022-03-09 MED ORDER — PANTOPRAZOLE SODIUM 40 MG PO TBEC
40.0000 mg | DELAYED_RELEASE_TABLET | Freq: Once | ORAL | Status: AC
  Administered 2022-03-09: 40 mg via ORAL
  Filled 2022-03-09: qty 1

## 2022-03-09 NOTE — ED Notes (Signed)
Patient is still off the floor at this time.

## 2022-03-09 NOTE — Assessment & Plan Note (Signed)
Patient with a history of hypertension who presents to the ER for evaluation of pain and difficulty with swallowing and noted to have significantly elevated blood pressure. Patient was started on a Cardene drip in the ER Continue metoprolol, lisinopril, amlodipine and nitrates and wean off Cardene drip as tolerated

## 2022-03-09 NOTE — ED Provider Notes (Signed)
[facility] Provider Note    Event Date/Time   First MD Initiated Contact with Patient 03/09/22 0413     (approximate)   History   trouble swallowing   HPI  Jennette Glotfelty is a 76 y.o. female with history of CLL on chemotherapy, coronary artery disease, hypertension, hyperlipidemia who presents to the emergency department with 2 days of pain with swallowing.  States pain is also in her chest.  She denies any sore throat.  She is able to swallow but states it is just painful.  No fevers, cough, shortness of breath, vomiting, diarrhea.  Blood pressure is extremely elevated on my evaluation in the 983J systolic.  She reports she has not had any of her blood pressure medication in the past day.  Looks like her blood pressures normally and run in the 825K to 539J systolic.  She is on lisinopril 20 mg and metoprolol 100 mg daily.   History provided by patient.    Past Medical History:  Diagnosis Date   Anxiety    Artery occlusion    Asthma    Back pain    Bronchitis    Cancer (HCC)    Skin Cancer   CLL (chronic lymphocytic leukemia) (HCC)    Coronary artery disease    Depression    Dyspnea    GERD (gastroesophageal reflux disease)    History of kidney stones    Hypercholesteremia    Hyperlipemia    Hypertension    Hypothyroidism    Myocardial infarction ([place]) 1999   Neuropathy    Restless leg syndrome    Sleep apnea    use C-PAP   Wears dentures    full upper, partial lower.  doesn't currently wear    Past Surgical History:  Procedure Laterality Date   COLONOSCOPY WITH PROPOFOL N/A 11/20/2016   Procedure: COLONOSCOPY WITH PROPOFOL;  Surgeon: Lucilla Lame, MD;  Location: [name];  Service: Endoscopy;  Laterality: N/A;  sleep apnea   CORONARY ANGIOPLASTY WITH STENT PLACEMENT  2008   DILATION AND CURETTAGE OF UTERUS     EYE SURGERY Bilateral    Cataract Extraction with IOL   HAND SURGERY Left 12/2017   upper hand fracture repair    LEFT HEART CATH AND CORONARY ANGIOGRAPHY N/A 06/29/2017   Procedure: LEFT HEART CATH AND CORONARY ANGIOGRAPHY;  Surgeon: Dionisio David, MD;  Location: [place] [facility];  Service: Cardiovascular;  Laterality: N/A;   LEFT HEART CATH AND CORONARY ANGIOGRAPHY Left 12/02/2018   Procedure: LEFT HEART CATH AND CORONARY ANGIOGRAPHY;  Surgeon: Dionisio David, MD;  Location: [place] [facility];  Service: Cardiovascular;  Laterality: Left;   OPEN REDUCTION INTERNAL FIXATION (ORIF) DISTAL RADIAL FRACTURE Right 08/12/2018   Procedure: OPEN REDUCTION INTERNAL FIXATION (ORIF) DISTAL RADIAL FRACTURE;  Surgeon: [name], MD;  Location: [facility];  Service: Orthopedics;  Laterality: Right;   POLYPECTOMY  11/20/2016   Procedure: POLYPECTOMY;  Surgeon: Lucilla Lame, MD;  Location: [place];  Service: Endoscopy;;   TUBAL LIGATION     VAGINAL HYSTERECTOMY     VULVECTOMY Right 01/31/2016   Procedure: WIDE EXCISION VULVECTOMY-RIGHT LABIA;  Surgeon: Brayton Mars, MD;  Location: [facility];  Service: Gynecology;  Laterality: Right;    MEDICATIONS:  Prior to Admission medications   Medication Sig Start Date End Date Taking? Authorizing Provider  acetaminophen (TYLENOL) 500 MG tablet Take 500-1,000 mg by mouth every 6 (six) hours as needed for moderate pain or fever.  [provider]  acetaminophen-codeine (TYLENOL #3) 300-30 MG tablet Take 1 tablet by mouth every 12 (twelve) hours as needed for moderate pain or severe pain. 01/26/22   Earlie Server, MD  albuterol (VENTOLIN HFA) 108 (90 Base) MCG/ACT inhaler Inhale 1-2 puffs into the lungs every 6 (six) hours as needed for wheezing or shortness of breath.    [provider]  amLODipine (NORVASC) 5 MG tablet Take 5 mg by mouth daily. 1 Tablet(s) By Mouth Daily 08/02/21   [provider]  aspirin EC 81 MG tablet Take 1 tablet (81 mg total) by mouth daily. 08/12/20   Enzo Bi, MD  atorvastatin (LIPITOR) 40 MG tablet Take 1 tablet  (40 mg total) by mouth daily. 08/13/20 02/16/22  Enzo Bi, MD  cilostazol (PLETAL) 100 MG tablet Take 100 mg by mouth 2 (two) times daily. Patient not taking: Reported on 01/03/2022 12/02/21   [provider]  clopidogrel (PLAVIX) 75 MG tablet Take 75 mg by mouth daily.     [provider]  dexlansoprazole (DEXILANT) 60 MG capsule Take 60 mg by mouth daily.     [provider]  gabapentin (NEURONTIN) 400 MG capsule Take 400 mg by mouth 3 (three) times daily. Patient not taking: Reported on 02/16/2022    [provider]  isosorbide mononitrate (IMDUR) 60 MG 24 hr tablet Take 60 mg by mouth daily.    [provider]  lisinopril (ZESTRIL) 40 MG tablet Hold until followup with outpatient provider due to acute kidney injury. 08/12/20   Enzo Bi, MD  metoprolol succinate (TOPROL-XL) 25 MG 24 hr tablet Take 25 mg by mouth daily.     [provider]  ondansetron (ZOFRAN) 4 MG tablet Take 1 tablet (4 mg total) by mouth every 8 (eight) hours as needed for nausea or vomiting. 01/26/22   Earlie Server, MD  PREMARIN vaginal cream USE 1 GRAM INTRAVAGINALLY TWICE PER WEEK 06/27/21   Rubie Maid, MD  XARELTO 2.5 MG TABS tablet Take 2.5 mg by mouth daily. 12/02/21   [provider]  zanubrutinib (BRUKINSA) 80 MG capsule Take 2 capsules (160 mg total) by mouth 2 (two) times daily. 02/20/22   Earlie Server, MD    Physical Exam   Triage Vital Signs: ED Triage Vitals  Enc Vitals Group     BP 03/08/22 2149 (!) 152/100     Pulse Rate 03/08/22 2149 (!) 52     Resp 03/08/22 2149 18     Temp 03/08/22 2149 97.9 F (36.6 C)     Temp Source 03/08/22 2149 Oral     SpO2 03/08/22 2149 94 %     Weight 03/08/22 2145 148 lb 9.6 oz (67.4 kg)     Height --      Head Circumference --      Peak Flow --      Pain Score 03/08/22 2145 5     Pain Loc --      Pain Edu? --      Excl. in [place]? --     Most recent vital signs: Vitals:   03/09/22 0540 03/09/22 0600  BP: (!) 214/75  (!) 201/75  Pulse: (!) 55 60  Resp: 16   Temp:    SpO2: 99% 98%    CONSTITUTIONAL: Alert and oriented and responds appropriately to questions.  Elderly, chronically ill-appearing HEAD: Normocephalic, atraumatic EYES: Conjunctivae clear, pupils appear equal, sclera nonicteric ENT: normal nose; moist mucous membranes; No pharyngeal erythema or petechiae, no tonsillar  hypertrophy or exudate, no uvular deviation, no unilateral swelling in posterior oropharynx, no trismus or drooling, no muffled voice, normal phonation, no stridor, airway patent.  No thrush noted. NECK: Supple, normal ROM, no cervical lymphadenopathy, trachea midline, no thyromegaly CARD: RRR; S1 and S2 appreciated; no murmurs, no clicks, no rubs, no gallops RESP: Normal chest excursion without splinting or tachypnea; breath sounds clear and equal bilaterally; no wheezes, no rhonchi, no rales, no hypoxia or respiratory distress, speaking full sentences ABD/GI: Normal bowel sounds; non-distended; soft, non-tender, no rebound, no guarding, no peritoneal signs BACK: The back appears normal EXT: Normal ROM in all joints; no deformity noted, no edema; no cyanosis SKIN: Normal color for age and race; warm; no rash on exposed skin NEURO: Moves all extremities equally, normal speech PSYCH: The patient's mood and manner are appropriate.   ED Results / Procedures / Treatments   LABS: (all labs ordered are listed, but only abnormal results are displayed) Labs Reviewed  CBC - Abnormal; Notable for the following components:      Result Value   WBC 52.4 (*)    Hemoglobin 11.9 (*)    All other components within normal limits  BASIC METABOLIC PANEL  HEPATIC FUNCTION PANEL  LIPASE, BLOOD  URINALYSIS, ROUTINE W REFLEX MICROSCOPIC  TROPONIN I (HIGH SENSITIVITY)  TROPONIN I (HIGH SENSITIVITY)     EKG:  EKG Interpretation  Date/Time:  Wednesday March 08 2022 21:50:04 EDT Ventricular Rate:  53 PR Interval:  196 QRS  Duration: 90 QT Interval:  434 QTC Calculation: 407 R Axis:   57 Text Interpretation: Sinus bradycardia Septal infarct (cited on or before 07-Aug-2020) ST & T wave abnormality, consider inferior ischemia Abnormal ECG When compared with ECG of 07-Aug-2020 15:37, Non-specific change in ST segment in Inferior leads Inverted T waves have replaced nonspecific T wave abnormality in Inferior leads Confirmed by Pryor Curia [phone]) on 03/09/2022 4:19:08 AM         RADIOLOGY: My personal review and interpretation of imaging: CT scan shows no dissection.  I have personally reviewed all radiology reports.   CT Angio Chest/Abd/Pel for Dissection W and/or Wo Contrast  Addendum Date: 03/09/2022   ADDENDUM REPORT: 03/09/2022 05:48 ADDENDUM: Not readily apparent on axial images, coronal images demonstrate a mild beaded appearance of the right renal artery suggesting FMD. This does not appear to result in substantial stenosis at this time. Electronically Signed   By: Misty Stanley M.D.   On: 03/09/2022 05:48   Result Date: 03/09/2022 CLINICAL DATA:  Trouble swallowing and painful swallowing. Pain radiates to epigastric area. Personal history of CLL. * Tracking Code: BO * EXAM: CT ANGIOGRAPHY CHEST, ABDOMEN AND PELVIS TECHNIQUE: Non-contrast CT of the chest was initially obtained. Multidetector CT imaging through the chest, abdomen and pelvis was performed using the standard protocol during bolus administration of intravenous contrast. Multiplanar reconstructed images and MIPs were obtained and reviewed to evaluate the vascular anatomy. RADIATION DOSE REDUCTION: This exam was performed according to the departmental dose-optimization program which includes automated exposure control, adjustment of the mA and/or kV according to patient size and/or use of iterative reconstruction technique. CONTRAST:  170mL OMNIPAQUE IOHEXOL 350 MG/ML SOLN COMPARISON:  Chest CT 10/14/2015 FINDINGS: CTA CHEST FINDINGS Cardiovascular:  Pre contrast imaging shows no hyperdense crescent in the wall of the thoracic aorta to suggest the presence of an acute intramural hematoma. No thoracic aortic aneurysm. No dissection of the thoracic aorta. Pulmonary arteries are well opacified without evidence for pulmonary embolus Mediastinum/Nodes: A  12 mm short axis precarinal lymph node is stable since 10/14/2015 when remeasured in a similar fashion on that prior study. No other mediastinal lymphadenopathy. No hilar lymphadenopathy. The esophagus has normal imaging features. There is no axillary lymphadenopathy. Lungs/Pleura: 4 mm right upper lobe nodule on 26/7 is stable since the 2017 exam 7 mm perifissural nodule posterior right middle lobe is also unchanged since that prior study. Given interval stability of these nodules, no specific imaging follow-up is warranted. No focal airspace consolidation. There is no evidence of pleural effusion. Musculoskeletal: No worrisome lytic or sclerotic osseous abnormality. Review of the MIP images confirms the above findings. CTA ABDOMEN AND PELVIS FINDINGS VASCULAR Aorta: Normal caliber aorta without aneurysm, dissection, vasculitis or significant stenosis. Moderate atherosclerotic calcification evident. Celiac: Patent without evidence of aneurysm, dissection, vasculitis or significant stenosis. Variant hepatic artery anatomy with left hepatic artery only arising from the celiac axis and another left hepatic artery arising from the SMA. SMA: Patent without evidence of aneurysm, dissection, vasculitis or significant stenosis. Variant hepatic artery anatomy with replaced right hepatic artery arising from the SMA. As above, 1 of 2 left hepatic arteries arises from the SMA. Renals: Both renal arteries are patent without evidence of aneurysm, dissection, vasculitis, fibromuscular dysplasia or significant stenosis. IMA: Patent without evidence of aneurysm, dissection, vasculitis or significant stenosis. Inflow: Patent without  evidence of aneurysm, dissection, vasculitis or significant stenosis. Veins: No obvious venous abnormality within the limitations of this arterial phase study. Review of the MIP images confirms the above findings. NON-VASCULAR Hepatobiliary: 9 mm hypoattenuating lesion in the dome of the lateral segment left liver is stable since the 2017 exam consistent with benign etiology. No followup recommended. There is no evidence for gallstones, gallbladder wall thickening, or pericholecystic fluid. No intrahepatic or extrahepatic biliary dilation. Pancreas: No focal mass lesion. No dilatation of the main duct. No intraparenchymal cyst. No peripancreatic edema. Spleen: No splenomegaly. No focal mass lesion. Adrenals/Urinary Tract: No adrenal nodule or mass. Cortical scarring noted both kidneys without hydronephrosis. 1.8 cm lesion upper pole right kidney has attenuation too high to be a simple cyst but cannot be definitively characterize (see coronal image 90/series 8). Similar 17 mm lesion identified interpolar left kidney on coronal 93/8. Tiny hypoattenuating lesions are noted in both kidneys, too small to characterize but likely benign. No evidence for hydroureter. The urinary bladder appears normal for the degree of distention. Stomach/Bowel: Stomach is unremarkable. No gastric wall thickening. No evidence of outlet obstruction. Duodenum is normally positioned as is the ligament of Treitz. No small bowel wall thickening. No small bowel dilatation. The terminal ileum is normal. The appendix is normal. No gross colonic mass. No colonic wall thickening. Diverticuli are seen scattered along the entire length of the colon without CT findings of diverticulitis. Lymphatic: Lymphadenopathy is identified in the gastrohepatic and hepatoduodenal ligaments. Index hepatoduodenal ligament lymph node measures 2 cm short axis. This lymph node was 1.5 cm short axis on the 10/14/2015 exam. Associated mesenteric and retroperitoneal  lymphadenopathy evident. 2 cm short axis left para-aortic node visible on 122/5, this area was not included on previous chest CT. Pelvic sidewall lymphadenopathy noted bilaterally with left pelvic sidewall lymph node measuring 1.3 cm short axis on 158/5. Reproductive: 5.5 x 4.4 cm soft tissue mass in the left pelvis. Left gonadal veins appear to arise directly from this lesion suggesting ovarian etiology. No right-sided adnexal mass. Uterus surgically absent. Other: No intraperitoneal free fluid. Musculoskeletal: Sclerotic lesion in the left iliac bone adjacent to  the SI joint is likely a bone island. No worrisome lytic or sclerotic osseous abnormality. Review of the MIP images confirms the above findings. IMPRESSION: 1. No evidence for thoracic or abdominal aortic aneurysm or dissection. 2. Lymphadenopathy is identified in the chest, abdomen, and pelvis in this patient with reported history of CLL. Mediastinal lymphadenopathy is similar to prior chest CT of 2017. A hepatoduodenal ligament lymph node included on that prior chest CT has increased in size in the interval. Additional abdominopelvic lymphadenopathy was not included on available prior imaging. Close follow-up recommended and comparison to old studies suggested to assess for progression. 3. Left pelvic soft tissue mass measures 5.5 x 4.4 cm. Left gonadal veins appear to arise directly from this lesion suggesting ovarian etiology. Pelvic ultrasound may prove helpful to further evaluate. 4. Bilateral renal lesions with attenuation too high to be simple cysts. These may be cysts complicated by proteinaceous debris or hemorrhage, but MRI abdomen with and without contrast recommended to confirm. 5. Diffuse colonic diverticulosis without diverticulitis. 6. Stable appearance of small right-sided pulmonary nodules, consistent with benign etiology. 7. Aortic Atherosclerosis (ICD10-I70.0). Electronically Signed: By: Misty Stanley M.D. On: 03/09/2022 05:33   DG  Chest 2 View  Result Date: 03/08/2022 CLINICAL DATA:  Epigastric pain. EXAM: CHEST - 2 VIEW COMPARISON:  Chest x-ray 08/07/2020.  CT of the chest 10/14/2015. FINDINGS: The heart size and mediastinal contours are within normal limits. Both lungs are clear. The visualized skeletal structures are unremarkable. IMPRESSION: No active cardiopulmonary disease. Electronically Signed   By: Ronney Asters M.D.   On: 03/08/2022 22:18     PROCEDURES:  Critical Care performed: Yes, see critical care procedure note(s)   CRITICAL CARE Performed by: Cyril Mourning Cordia Miklos   Total critical care time: 45 minutes  Critical care time was exclusive of separately billable procedures and treating other patients.  Critical care was necessary to treat or prevent imminent or life-threatening deterioration.  Critical care was time spent personally by me on the following activities: development of treatment plan with patient and/or surrogate as well as nursing, discussions with consultants, evaluation of patient's response to treatment, examination of patient, obtaining history from patient or surrogate, ordering and performing treatments and interventions, ordering and review of laboratory studies, ordering and review of radiographic studies, pulse oximetry and re-evaluation of patient's condition.   Marland Kitchen1-3 Lead EKG Interpretation  Performed by: Claudius Mich, Delice Bison, DO Authorized by: Kynzleigh Bandel, Delice Bison, DO     Interpretation: abnormal     ECG rate:  46   ECG rate assessment: bradycardic     Rhythm: sinus bradycardia     Ectopy: none     Conduction: normal       IMPRESSION / MDM / ASSESSMENT AND PLAN / ED COURSE  I reviewed the triage vital signs and the nursing notes.    Patient here with complaints of pain with swallowing that goes into the chest.  The patient is on the cardiac monitor to evaluate for evidence of arrhythmia and/or significant heart rate changes.   DIFFERENTIAL DIAGNOSIS (includes but not limited  to):   Pharyngitis, thrush, ACS, dissection, esophageal spasm, esophagitis, GERD   Patient's presentation is most consistent with acute presentation with potential threat to life or bodily function.   PLAN: We will obtain CBC, CMP, lipase, troponin x2, CT dissection study.  We will give Mylanta, viscous lidocaine, Protonix.  No sign of thrush, deep space neck infection, PTA, tonsillitis on exam.  Normal phonation and she is handling her  secretions without difficulty.  Doubt esophageal obstruction.  She is also very hypertensive here reports she has not had any of her medications in the past day.  We have given her lisinopril and amlodipine but will hold her metoprolol given heart rate in the 40s.  No arrhythmia, interval abnormality, AV blocks.  She has had heart rates in the 40s documented previously.  Likely due to her metoprolol.   MEDICATIONS GIVEN IN ED: Medications  nicardipine (CARDENE) 20mg  in 0.86% saline 27ml IV infusion (0.1 mg/ml) (5 mg/hr Intravenous New Bag/Given 03/09/22 0717)  sodium chloride 0.9 % bolus 1,000 mL (0 mLs Intravenous Stopped 03/09/22 0553)  alum & mag hydroxide-simeth (MAALOX/MYLANTA) 200-200-20 MG/5ML suspension 30 mL (30 mLs Oral Given 03/09/22 0439)  lidocaine (XYLOCAINE) 2 % viscous mouth solution 15 mL (15 mLs Mouth/Throat Given 03/09/22 0439)  pantoprazole (PROTONIX) EC tablet 40 mg (40 mg Oral Given 03/09/22 0439)  amLODipine (NORVASC) tablet 5 mg (5 mg Oral Given 03/09/22 0439)  lisinopril (ZESTRIL) tablet 40 mg (40 mg Oral Given 03/09/22 0439)  iohexol (OMNIPAQUE) 350 MG/ML injection 100 mL (100 mLs Intravenous Contrast Given 03/09/22 0500)  hydrALAZINE (APRESOLINE) injection 10 mg (10 mg Intravenous Given 03/09/22 0544)     ED COURSE: Patient has a chronic leukocytosis from her CLL.  Hemoglobin stable.  Normal electrolytes, renal function, LFTs, lipase.  Troponin x2 negative.  Chest x-ray reviewed and interpreted by myself radiologist and shows no acute  abnormality.  CT pending.    CTA reviewed and interpreted by myself and the radiologist.  Patient has no aortic aneurysm or dissection.  She has diffuse lymphadenopathy consistent with CLL.  She also has a left pelvic mass likely coming from the ovary.  Will obtain ultrasound for further evaluation.  She denies any lower abdominal pain and states that she knows she has a history of cervical cancer but is not aware of any history of ovarian masses.  Patient also has bilateral renal lesions that may be cysts complicated by proteinaceous debris or hemorrhage.  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  I have given her home medications and also IV hydralazine and her blood pressures continue to be in the 301S - 010X systolic.  Given she is still having chest pain, will start a Cardene infusion for hypertensive urgency and discussed with the hospitalist for admission.   Blood pressure continues to be 205/87 and she continues to have chest pain.  We will start Cardene infusion and discussed with medicine for admission.  Pelvic ultrasound shows what appears to be a left ovarian mass on my review but no torsion.  Awaiting official radiology read.  CONSULTS:  Consulted and discussed patient's case with hospitalist, Dr. Francine Graven.  I have recommended admission and consulting physician agrees and will place admission orders.  Patient (and family if present) agree with this plan.   I reviewed all nursing notes, vitals, pertinent previous records.  All labs, EKGs, imaging ordered have been independently reviewed and interpreted by myself.    OUTSIDE RECORDS REVIEWED: Reviewed patient's previous gastroenterology note with Dr. Alice Reichert on 09/22/2021.       FINAL CLINICAL IMPRESSION(S) / ED DIAGNOSES   Final diagnoses:  Odynophagia  Hypertensive urgency  Chest  pain, unspecified type     Rx / DC Orders   ED Discharge Orders     None  Note:  This document was prepared using Dragon voice recognition software and may include unintentional dictation errors.   Gavan Nordby, Delice Bison, DO 03/09/22 [phone]

## 2022-03-09 NOTE — H&P (Addendum)
History and Physical    Patient: Karen [name] HYQ:[id number] DOB: Sep 16, 1945 DOA: 03/09/2022 DOS: the patient was seen and examined on 03/09/2022 PCP: Perrin Maltese, MD  Patient coming from: Home  Chief Complaint:  Chief Complaint  Patient presents with   trouble swallowing   HPI: Karen [name] is a 76 y.o. female with medical history significant A-fib on chronic anticoagulation, history of CLL on oral chemotherapy, depression, coronary artery disease, history of CVA, hypothyroidism, history of sleep apnea who presents to the ER for evaluation of a 2-day history of pain and difficulty with swallowing.  Symptoms occur both with liquids and solids.  She also complains of midsternal chest pain.  She denies having any drooling, no fever, no chills, no cough, no shortness of breath, no changes in her bowel habits, no vomiting or diarrhea, no leg swelling, no headache, no palpitations, no dizziness, no lightheadedness, no blurred vision or focal deficit. Patient states that she was on a different oral chemotherapy which she was unable to tolerate due to difficulty swallowing and chart review shows that she was initially placed on acalabrutinib which was discontinued after patient complained of feeling like " her throat was closing up" Her blood pressure was significantly elevated in the ER with systolic blood pressure greater than 200 and she received a dose of hydralazine 10 mg IV, amlodipine 5 mg as well as lisinopril 40 mg without any significant improvement in her blood pressure.  She was then started on a Cardene drip. She will be admitted to the hospital for further evaluation.    Review of Systems: As mentioned in the history of present illness. All other systems reviewed and are negative. Past Medical History:  Diagnosis Date   Anxiety    Artery occlusion    Asthma    Back pain    Bronchitis    Cancer (HCC)    Skin Cancer   CLL (chronic lymphocytic leukemia) (HCC)    Coronary artery  disease    Depression    Dyspnea    GERD (gastroesophageal reflux disease)    History of kidney stones    Hypercholesteremia    Hyperlipemia    Hypertension    Hypothyroidism    Myocardial infarction ([place]) 1999   Neuropathy    Restless leg syndrome    Sleep apnea    use C-PAP   Wears dentures    full upper, partial lower.  doesn't currently wear   Past Surgical History:  Procedure Laterality Date   COLONOSCOPY WITH PROPOFOL N/A 11/20/2016   Procedure: COLONOSCOPY WITH PROPOFOL;  Surgeon: Lucilla Lame, MD;  Location: [facility];  Service: Endoscopy;  Laterality: N/A;  sleep apnea   CORONARY ANGIOPLASTY WITH STENT PLACEMENT  2008   DILATION AND CURETTAGE OF UTERUS     EYE SURGERY Bilateral    Cataract Extraction with IOL   HAND SURGERY Left 12/2017   upper hand fracture repair   LEFT HEART CATH AND CORONARY ANGIOGRAPHY N/A 06/29/2017   Procedure: LEFT HEART CATH AND CORONARY ANGIOGRAPHY;  Surgeon: Dionisio David, MD;  Location: [facility];  Service: Cardiovascular;  Laterality: N/A;   LEFT HEART CATH AND CORONARY ANGIOGRAPHY Left 12/02/2018   Procedure: LEFT HEART CATH AND CORONARY ANGIOGRAPHY;  Surgeon: Dionisio David, MD;  Location: [facility];  Service: Cardiovascular;  Laterality: Left;   OPEN REDUCTION INTERNAL FIXATION (ORIF) DISTAL RADIAL FRACTURE Right 08/12/2018   Procedure: OPEN REDUCTION INTERNAL FIXATION (ORIF) DISTAL RADIAL FRACTURE;  Surgeon:  [name], MD;  Location: [facility];  Service: Orthopedics;  Laterality: Right;   POLYPECTOMY  11/20/2016   Procedure: POLYPECTOMY;  Surgeon: Lucilla Lame, MD;  Location: [facility];  Service: Endoscopy;;   TUBAL LIGATION     VAGINAL HYSTERECTOMY     VULVECTOMY Right 01/31/2016   Procedure: WIDE EXCISION VULVECTOMY-RIGHT LABIA;  Surgeon: Brayton Mars, MD;  Location: [facility];  Service: Gynecology;  Laterality: Right;   Social History:  reports that she has been smoking cigarettes.  She has a 35.00 pack-year smoking history. Her smokeless tobacco use includes snuff. She reports that she does not drink alcohol and does not use drugs.  Allergies  Allergen Reactions   Requip [Ropinirole Hcl] Anaphylaxis    Throat closing   Celebrex [Celecoxib] Itching   Meloxicam Palpitations   Nitrofurantoin Palpitations    Family History  Problem Relation Age of Onset   Heart disease Father    Stroke Mother    Hypertension Mother    Lung cancer Brother    Heart attack Brother    Congestive Heart Failure Sister    Cervical cancer Sister    Cancer Neg Hx    Diabetes Neg Hx    Breast cancer Neg Hx     Prior to Admission medications   Medication Sig Start Date End Date Taking? Authorizing Provider  acetaminophen (TYLENOL) 500 MG tablet Take 500-1,000 mg by mouth every 6 (six) hours as needed for moderate pain or fever.    [provider]  acetaminophen-codeine (TYLENOL #3) 300-30 MG tablet Take 1 tablet by mouth every 12 (twelve) hours as needed for moderate pain or severe pain. 01/26/22   Earlie Server, MD  albuterol (VENTOLIN HFA) 108 (90 Base) MCG/ACT inhaler Inhale 1-2 puffs into the lungs every 6 (six) hours as needed for wheezing or shortness of breath.    [provider]  amLODipine (NORVASC) 5 MG tablet Take 5 mg by mouth daily. 1 Tablet(s) By Mouth Daily 08/02/21   [provider]  aspirin EC 81 MG tablet Take 1 tablet (81 mg total) by mouth daily. 08/12/20   Enzo Bi, MD  atorvastatin (LIPITOR) 40 MG tablet Take 1 tablet (40 mg total) by mouth daily. 08/13/20 02/16/22  Enzo Bi, MD  cilostazol (PLETAL) 100 MG tablet Take 100 mg by mouth 2 (two) times daily. Patient not taking: Reported on 01/03/2022 12/02/21   [provider]  clopidogrel (PLAVIX) 75 MG tablet Take 75 mg by mouth daily.     [provider]  dexlansoprazole (DEXILANT) 60 MG capsule Take 60 mg by mouth daily.     [provider]  furosemide (LASIX) 20 MG tablet  Take 20 mg by mouth daily. 03/02/22   [provider]  gabapentin (NEURONTIN) 400 MG capsule Take 400 mg by mouth 3 (three) times daily. Patient not taking: Reported on 02/16/2022    [provider]  isosorbide mononitrate (IMDUR) 120 MG 24 hr tablet Take 120 mg by mouth daily. 02/24/22   [provider]  isosorbide mononitrate (IMDUR) 60 MG 24 hr tablet Take 60 mg by mouth daily.    [provider]  lisinopril (ZESTRIL) 40 MG tablet Hold until followup with outpatient provider due to acute kidney injury. 08/12/20   Enzo Bi, MD  metoprolol succinate (TOPROL-XL) 100 MG 24 hr tablet Take 100 mg by mouth daily. 03/02/22   [provider]  metoprolol succinate (TOPROL-XL) 25 MG 24 hr tablet Take 25 mg by mouth daily.  [provider]  ondansetron (ZOFRAN) 4 MG tablet Take 1 tablet (4 mg total) by mouth every 8 (eight) hours as needed for nausea or vomiting. 01/26/22   Earlie Server, MD  PREMARIN vaginal cream USE 1 GRAM INTRAVAGINALLY TWICE PER WEEK 06/27/21   Rubie Maid, MD  traZODone (DESYREL) 50 MG tablet Take 50 mg by mouth at bedtime. 02/27/22   [provider]  XARELTO 2.5 MG TABS tablet Take 2.5 mg by mouth daily. 12/02/21   [provider]  zanubrutinib (BRUKINSA) 80 MG capsule Take 2 capsules (160 mg total) by mouth 2 (two) times daily. 02/20/22   Earlie Server, MD    Physical Exam: Vitals:   03/09/22 0600 03/09/22 0715 03/09/22 0750 03/09/22 0800  BP: (!) 201/75 (!) 197/78 (!) 163/64 (!) 158/66  Pulse: 60 68 69 66  Resp:  12 17 18   Temp:      TempSrc:      SpO2: 98% 97% 94% 95%  Weight:       Physical Exam Vitals and nursing note reviewed.  Constitutional:      Comments: Chronically ill-appearing  HENT:     Head: Normocephalic and atraumatic.     Nose: Nose normal.     Mouth/Throat:     Mouth: Mucous membranes are dry.  Eyes:     Comments: Pale conjunctiva  Cardiovascular:     Rate and Rhythm: Normal rate and regular  rhythm.  Pulmonary:     Effort: Pulmonary effort is normal.     Breath sounds: Normal breath sounds.  Abdominal:     General: Abdomen is flat. Bowel sounds are normal.     Palpations: Abdomen is soft.  Musculoskeletal:        General: Normal range of motion.     Cervical back: Normal range of motion and neck supple.  Skin:    General: Skin is warm and dry.  Neurological:     General: No focal deficit present.     Mental Status: She is alert and oriented to person, place, and time.  Psychiatric:        Mood and Affect: Mood normal.        Behavior: Behavior normal.     Data Reviewed: Relevant notes from primary care and specialist visits, past discharge summaries as available in EHR, including Care Everywhere. Prior diagnostic testing as pertinent to current admission diagnoses Updated medications and problem lists for reconciliation ED course, including vitals, labs, imaging, treatment and response to treatment Triage notes, nursing and pharmacy notes and ED provider's notes Notable results as noted in HPI Labs reviewed.  Troponin 7 >> 8, total protein 6.9, albumin 4.1, AST 15, ALT 10, alkaline phosphatase 70, total bilirubin 0.4, lipase 48, white count 52.4, hemoglobin 11.9, hematocrit 38, platelet count 172, sodium 137, potassium 3.9, chloride 103, bicarb 27, glucose 97, BUN 12, creatinine 0.97, calcium 9.9 Chest x-ray reviewed by me shows no active cardiopulmonary disease. CT angiography of chest/abdomen and pelvis shows no evidence for thoracic or abdominal aortic aneurysm or dissection.Lymphadenopathy is identified in the chest, abdomen, and pelvis in this patient with reported history of CLL. Mediastinal lymphadenopathy is similar to prior chest CT of 2017. A hepatoduodenal ligament lymph node included on that prior chest CT has increased in size in the interval. Additional abdominopelvic lymphadenopathy was not included on available prior imaging. Close follow-up recommended  and comparison to old studies suggested to assess for progression. Left pelvic soft tissue mass measures 5.5 x 4.4 cm.  Left gonadal veins appear to arise directly from this lesion suggesting ovarian etiology. Pelvic ultrasound may prove helpful to further evaluate. Bilateral renal lesions with attenuation too high to be simple cysts. These may be cysts complicated by proteinaceous debris or hemorrhage, but MRI abdomen with and without contrast recommended to confirm. Diffuse colonic diverticulosis without diverticulitis. Stable appearance of small right-sided pulmonary nodules, consistent with benign etiology. Aortic Atherosclerosis . Pelvic ultrasound showed 5.1 cm solid soft tissue mass in the left adnexa likely representing left ovarian origin. Given that this patient has a reported history of CLL, there is likely prior imaging available outside are system. Consider comparison to previous outside studies to assess chronicity of this finding. No evidence for ovarian torsion. Status post hysterectomy. Twelve-lead EKG reviewed by me shows sinus bradycardia with nonspecific ST changes in the inferior leads. There are no new results to review at this time.  Assessment and Plan: * Hypertensive urgency Patient with a history of hypertension who presents to the ER for evaluation of pain and difficulty with swallowing and noted to have significantly elevated blood pressure. Patient was started on a Cardene drip in the ER Continue metoprolol, lisinopril, amlodipine and nitrates and wean off Cardene drip as tolerated  Odynophagia Patient presents to the ER for evaluation of a 2-day history of painful and difficulty swallowing both solids and liquids. She has a history of GERD and also complains of midsternal chest pain. We will place patient on IV Protonix Keep patient n.p.o. for now Consult GI for further evaluation to rule out reflux esophagitis as the cause of this patient's  odynophagia  Paroxysmal atrial fibrillation (HCC) Continue metoprolol for rate control Hold Xarelto until evaluated by gastroenterology   History of CVA (cerebrovascular accident) Patient has a prior history of a CVA Hold aspirin and Plavix until patient is seen by GI Continue statin  Depression Continue trazodone  Coronary artery disease Stable Continue nitrates, statins and beta-blockers Hold aspirin and Plavix for now until patient is evaluated by GI   Tobacco abuse Patient is an everyday smoker but declines a nicotine transdermal patch at this time. Smoking cessation counseling has been discussed with her in detail  CLL (chronic lymphocytic leukemia) ([place]) Stable Continue Zanubrutinib Follow-up with oncology as an outpatient      Advance Care Planning:   Code Status: Full Code   Consults: Gastroenterology  Family Communication: Greater than 50% of time was spent discussing patient's condition and plan of care with her at the bedside.  All questions and concerns have been addressed.  She verbalizes understanding and agrees with the plan.  Severity of Illness: The appropriate patient status for this patient is INPATIENT. Inpatient status is judged to be reasonable and necessary in order to provide the required intensity of service to ensure the patient's safety. The patient's presenting symptoms, physical exam findings, and initial radiographic and laboratory data in the context of their chronic comorbidities is felt to place them at high risk for further clinical deterioration. Furthermore, it is not anticipated that the patient will be medically stable for discharge from the hospital within 2 midnights of admission.   * I certify that at the point of admission it is my clinical judgment that the patient will require inpatient hospital care spanning beyond 2 midnights from the point of admission due to high intensity of service, high risk for further deterioration and high  frequency of surveillance required.*  Author: Collier Bullock, MD 03/09/2022 9:52 AM  For on call review [email or web address].

## 2022-03-09 NOTE — Assessment & Plan Note (Addendum)
Stable Continue nitrates, statins and beta-blockers Hold aspirin and Plavix for now until patient is evaluated by GI

## 2022-03-09 NOTE — Consult Note (Signed)
Lucilla Lame, MD [facility]  [address] [place], [place] [address] Phone: [phone] Fax : [phone]  Consultation  Referring Provider:     Dr. Francine Graven Primary Care Physician:  Perrin Maltese, MD Primary Gastroenterologist:  Dr. Alice Reichert         Reason for Consultation:     Dysphagia  Date of Admission:  03/09/2022 Date of Consultation:  03/09/2022         HPI:   Karen [name] is a 76 y.o. female who is seen me in the past for colonoscopy but was recently supposed to have a colonoscopy by Dr. Alice Reichert as a follow-up.  The patient has a history of CLL on oral chemotherapy and is on Plavix for A-fib.  The patient also has a history of depression coronary artery disease hypothyroidism and sleep apnea.  The patient states that she has been having trouble swallowing for the last 2 days.  She reports that her throat feels like it is closing up.  There is no report of any hematemesis black stools or bloody stools.  In addition to trouble swallowing she also reports that when she does swallow it is very painful.  Past Medical History:  Diagnosis Date   Anxiety    Artery occlusion    Asthma    Back pain    Bronchitis    Cancer (HCC)    Skin Cancer   CLL (chronic lymphocytic leukemia) (HCC)    Coronary artery disease    Depression    Dyspnea    GERD (gastroesophageal reflux disease)    History of kidney stones    Hypercholesteremia    Hyperlipemia    Hypertension    Hypothyroidism    Myocardial infarction ([place]) 1999   Neuropathy    Restless leg syndrome    Sleep apnea    use C-PAP   Wears dentures    full upper, partial lower.  doesn't currently wear    Past Surgical History:  Procedure Laterality Date   COLONOSCOPY WITH PROPOFOL N/A 11/20/2016   Procedure: COLONOSCOPY WITH PROPOFOL;  Surgeon: Lucilla Lame, MD;  Location: [facility];  Service: Endoscopy;  Laterality: N/A;  sleep apnea   CORONARY ANGIOPLASTY WITH STENT PLACEMENT  2008   DILATION AND  CURETTAGE OF UTERUS     EYE SURGERY Bilateral    Cataract Extraction with IOL   HAND SURGERY Left 12/2017   upper hand fracture repair   LEFT HEART CATH AND CORONARY ANGIOGRAPHY N/A 06/29/2017   Procedure: LEFT HEART CATH AND CORONARY ANGIOGRAPHY;  Surgeon: Dionisio David, MD;  Location: [facility];  Service: Cardiovascular;  Laterality: N/A;   LEFT HEART CATH AND CORONARY ANGIOGRAPHY Left 12/02/2018   Procedure: LEFT HEART CATH AND CORONARY ANGIOGRAPHY;  Surgeon: Dionisio David, MD;  Location: [facility];  Service: Cardiovascular;  Laterality: Left;   OPEN REDUCTION INTERNAL FIXATION (ORIF) DISTAL RADIAL FRACTURE Right 08/12/2018   Procedure: OPEN REDUCTION INTERNAL FIXATION (ORIF) DISTAL RADIAL FRACTURE;  Surgeon: [name], MD;  Location: [facility];  Service: Orthopedics;  Laterality: Right;   POLYPECTOMY  11/20/2016   Procedure: POLYPECTOMY;  Surgeon: Lucilla Lame, MD;  Location: [facility];  Service: Endoscopy;;   TUBAL LIGATION     VAGINAL HYSTERECTOMY     VULVECTOMY Right 01/31/2016   Procedure: WIDE EXCISION VULVECTOMY-RIGHT LABIA;  Surgeon: Brayton Mars, MD;  Location: [facility];  Service: Gynecology;  Laterality: Right;    Prior  to Admission medications   Medication Sig Start Date End Date Taking? Authorizing Provider  acetaminophen (TYLENOL) 500 MG tablet Take 500-1,000 mg by mouth every 6 (six) hours as needed for moderate pain or fever.   Yes [provider]  acetaminophen-codeine (TYLENOL #3) 300-30 MG tablet Take 1 tablet by mouth every 12 (twelve) hours as needed for moderate pain or severe pain. 01/26/22  Yes Earlie Server, MD  albuterol (VENTOLIN HFA) 108 (90 Base) MCG/ACT inhaler Inhale 1-2 puffs into the lungs every 6 (six) hours as needed for wheezing or shortness of breath.   Yes [provider]  amLODipine (NORVASC) 5 MG tablet Take 5 mg by mouth daily. 1 Tablet(s) By Mouth Daily 08/02/21  Yes [provider]   aspirin EC 81 MG tablet Take 1 tablet (81 mg total) by mouth daily. 08/12/20  Yes Enzo Bi, MD  atorvastatin (LIPITOR) 40 MG tablet Take 1 tablet (40 mg total) by mouth daily. 08/13/20 03/09/22 Yes Enzo Bi, MD  clopidogrel (PLAVIX) 75 MG tablet Take 75 mg by mouth daily.    Yes [provider]  dexlansoprazole (DEXILANT) 60 MG capsule Take 60 mg by mouth daily.    Yes [provider]  furosemide (LASIX) 20 MG tablet Take 20 mg by mouth daily. 03/02/22  Yes [provider]  isosorbide mononitrate (IMDUR) 120 MG 24 hr tablet Take 120 mg by mouth daily. 02/24/22  Yes [provider]  isosorbide mononitrate (IMDUR) 60 MG 24 hr tablet Take 60 mg by mouth daily.   Yes [provider]  lisinopril (ZESTRIL) 40 MG tablet Hold until followup with outpatient provider due to acute kidney injury. 08/12/20  Yes Enzo Bi, MD  metoprolol succinate (TOPROL-XL) 100 MG 24 hr tablet Take 100 mg by mouth daily. 03/02/22  Yes [provider]  ondansetron (ZOFRAN) 4 MG tablet Take 1 tablet (4 mg total) by mouth every 8 (eight) hours as needed for nausea or vomiting. 01/26/22  Yes Earlie Server, MD  PREMARIN vaginal cream USE 1 GRAM INTRAVAGINALLY TWICE PER WEEK 06/27/21  Yes Rubie Maid, MD  traZODone (DESYREL) 50 MG tablet Take 50 mg by mouth at bedtime. 02/27/22  Yes [provider]  XARELTO 2.5 MG TABS tablet Take 2.5 mg by mouth daily. 12/02/21  Yes [provider]  zanubrutinib (BRUKINSA) 80 MG capsule Take 2 capsules (160 mg total) by mouth 2 (two) times daily. 02/20/22  Yes Earlie Server, MD  cilostazol (PLETAL) 100 MG tablet Take 100 mg by mouth 2 (two) times daily. Patient not taking: Reported on 01/03/2022 12/02/21   [provider]  gabapentin (NEURONTIN) 400 MG capsule Take 400 mg by mouth 3 (three) times daily. Patient not taking: Reported on 02/16/2022    [provider]  metoprolol succinate (TOPROL-XL) 25 MG 24 hr tablet Take 25 mg by  mouth daily.  Patient not taking: Reported on 03/09/2022    [provider]    Family History  Problem Relation Age of Onset   Heart disease Father    Stroke Mother    Hypertension Mother    Lung cancer Brother    Heart attack Brother    Congestive Heart Failure Sister    Cervical cancer Sister    Cancer Neg Hx    Diabetes Neg Hx    Breast cancer Neg Hx      Social History   Tobacco Use   Smoking status: Every Day    Packs/day: 1.00    Years:  35.00    Total pack years: 35.00    Types: Cigarettes   Smokeless tobacco: Current    Types: Snuff  Vaping Use   Vaping Use: Never used  Substance Use Topics   Alcohol use: No    Alcohol/week: 0.0 standard drinks of alcohol   Drug use: No    Allergies as of 03/08/2022 - Review Complete 03/08/2022  Allergen Reaction Noted   Requip [ropinirole hcl] Anaphylaxis 12/01/2014   Celebrex [celecoxib] Itching 12/01/2014   Meloxicam Palpitations    Nitrofurantoin Palpitations 11/10/2015    Review of Systems:    All systems reviewed and negative except where noted in HPI.   Physical Exam:  Vital signs in last 24 hours: Temp:  [97.6 F (36.4 C)-97.9 F (36.6 C)] 97.9 F (36.6 C) (08/17 1140) Pulse Rate:  [45-81] 75 (08/17 1140) Resp:  [12-18] 18 (08/17 1140) BP: (142-214)/(64-100) 176/76 (08/17 1140) SpO2:  [91 %-100 %] 99 % (08/17 1140) Weight:  [67.4 kg] 67.4 kg (08/17 1140)   General:   Pleasant, cooperative in NAD Head:  Normocephalic and atraumatic. Eyes:   No icterus.   Conjunctiva pink. PERRLA. Ears:  Normal auditory acuity. Neck:  Supple; no masses or thyroidomegaly Lungs: Respirations even and unlabored. Lungs clear to auscultation bilaterally.   No wheezes, crackles, or rhonchi.  Heart:  Regular rate and rhythm;  Without murmur, clicks, rubs or gallops Abdomen:  Soft, nondistended, nontender. Normal bowel sounds. No appreciable masses or hepatomegaly.  No rebound or guarding.  Rectal:  Not performed. Msk:   Symmetrical without gross deformities.    Extremities:  Without edema, cyanosis or clubbing. Neurologic:  Alert and oriented x3;  grossly normal neurologically. Skin:  Intact without significant lesions or rashes. Cervical Nodes:  No significant cervical adenopathy. Psych:  Alert and cooperative. Normal affect.  LAB RESULTS: Recent Labs    03/08/22 2154  WBC 52.4*  HGB 11.9*  HCT 38.0  PLT 172   BMET Recent Labs    03/08/22 2154  NA 137  K 3.9  CL 103  CO2 27  GLUCOSE 97  BUN 12  CREATININE 0.97  CALCIUM 9.9   LFT Recent Labs    03/08/22 2154  PROT 6.9  ALBUMIN 4.1  AST 15  ALT 10  ALKPHOS 70  BILITOT 0.4  BILIDIR <0.1  IBILI NOT CALCULATED   PT/INR No results for input(s): "LABPROT", "INR" in the last 72 hours.  STUDIES: US PELVIC COMPLETE W TRANSVAGINAL AND TORSION R/O  Result Date: 03/09/2022 CLINICAL DATA:  Pelvic mass seen on CT. EXAM: TRANSABDOMINAL AND TRANSVAGINAL ULTRASOUND OF PELVIS DOPPLER ULTRASOUND OF OVARIES TECHNIQUE: Both transabdominal and transvaginal ultrasound examinations of the pelvis were performed. Transabdominal technique was performed for global imaging of the pelvis including uterus, ovaries, adnexal regions, and pelvic cul-de-sac. It was necessary to proceed with endovaginal exam following the transabdominal exam to visualize the left ovary. Color and duplex Doppler ultrasound was utilized to evaluate blood flow to the ovaries. COMPARISON:  CT scan earlier same day FINDINGS: Uterus Measurements: Surgically absent. Endometrium Thickness: N/A. Right ovary Measurements: 2.6 x 1.8 x 1.7 cm = volume: 4.3 mL. Normal appearance/no adnexal mass. Left ovary Measurements: 5.1 x 3.4 x 4.3 cm = volume: 36 mL. Enlarged left ovary with homogeneous solid soft tissue echogenicity. Pulsed Doppler evaluation of both ovaries demonstrates normal low-resistance arterial and venous waveforms. Other findings No abnormal free fluid. IMPRESSION: 1. 5.1 cm solid soft  tissue mass in the left adnexa likely representing left  ovarian origin. Given that this patient has a reported history of CLL, there is likely prior imaging available outside are system. Consider comparison to previous outside studies to assess chronicity of this finding. 2. No evidence for ovarian torsion. 3. Status post hysterectomy. Electronically Signed   By: Misty Stanley M.D.   On: 03/09/2022 07:52   CT Angio Chest/Abd/Pel for Dissection W and/or Wo Contrast  Addendum Date: 03/09/2022   ADDENDUM REPORT: 03/09/2022 05:48 ADDENDUM: Not readily apparent on axial images, coronal images demonstrate a mild beaded appearance of the right renal artery suggesting FMD. This does not appear to result in substantial stenosis at this time. Electronically Signed   By: Misty Stanley M.D.   On: 03/09/2022 05:48   Result Date: 03/09/2022 CLINICAL DATA:  Trouble swallowing and painful swallowing. Pain radiates to epigastric area. Personal history of CLL. * Tracking Code: BO * EXAM: CT ANGIOGRAPHY CHEST, ABDOMEN AND PELVIS TECHNIQUE: Non-contrast CT of the chest was initially obtained. Multidetector CT imaging through the chest, abdomen and pelvis was performed using the standard protocol during bolus administration of intravenous contrast. Multiplanar reconstructed images and MIPs were obtained and reviewed to evaluate the vascular anatomy. RADIATION DOSE REDUCTION: This exam was performed according to the departmental dose-optimization program which includes automated exposure control, adjustment of the mA and/or kV according to patient size and/or use of iterative reconstruction technique. CONTRAST:  135mL OMNIPAQUE IOHEXOL 350 MG/ML SOLN COMPARISON:  Chest CT 10/14/2015 FINDINGS: CTA CHEST FINDINGS Cardiovascular: Pre contrast imaging shows no hyperdense crescent in the wall of the thoracic aorta to suggest the presence of an acute intramural hematoma. No thoracic aortic aneurysm. No dissection of the thoracic aorta.  Pulmonary arteries are well opacified without evidence for pulmonary embolus Mediastinum/Nodes: A 12 mm short axis precarinal lymph node is stable since 10/14/2015 when remeasured in a similar fashion on that prior study. No other mediastinal lymphadenopathy. No hilar lymphadenopathy. The esophagus has normal imaging features. There is no axillary lymphadenopathy. Lungs/Pleura: 4 mm right upper lobe nodule on 26/7 is stable since the 2017 exam 7 mm perifissural nodule posterior right middle lobe is also unchanged since that prior study. Given interval stability of these nodules, no specific imaging follow-up is warranted. No focal airspace consolidation. There is no evidence of pleural effusion. Musculoskeletal: No worrisome lytic or sclerotic osseous abnormality. Review of the MIP images confirms the above findings. CTA ABDOMEN AND PELVIS FINDINGS VASCULAR Aorta: Normal caliber aorta without aneurysm, dissection, vasculitis or significant stenosis. Moderate atherosclerotic calcification evident. Celiac: Patent without evidence of aneurysm, dissection, vasculitis or significant stenosis. Variant hepatic artery anatomy with left hepatic artery only arising from the celiac axis and another left hepatic artery arising from the SMA. SMA: Patent without evidence of aneurysm, dissection, vasculitis or significant stenosis. Variant hepatic artery anatomy with replaced right hepatic artery arising from the SMA. As above, 1 of 2 left hepatic arteries arises from the SMA. Renals: Both renal arteries are patent without evidence of aneurysm, dissection, vasculitis, fibromuscular dysplasia or significant stenosis. IMA: Patent without evidence of aneurysm, dissection, vasculitis or significant stenosis. Inflow: Patent without evidence of aneurysm, dissection, vasculitis or significant stenosis. Veins: No obvious venous abnormality within the limitations of this arterial phase study. Review of the MIP images confirms the above  findings. NON-VASCULAR Hepatobiliary: 9 mm hypoattenuating lesion in the dome of the lateral segment left liver is stable since the 2017 exam consistent with benign etiology. No followup recommended. There is no evidence for gallstones, gallbladder wall thickening,  or pericholecystic fluid. No intrahepatic or extrahepatic biliary dilation. Pancreas: No focal mass lesion. No dilatation of the main duct. No intraparenchymal cyst. No peripancreatic edema. Spleen: No splenomegaly. No focal mass lesion. Adrenals/Urinary Tract: No adrenal nodule or mass. Cortical scarring noted both kidneys without hydronephrosis. 1.8 cm lesion upper pole right kidney has attenuation too high to be a simple cyst but cannot be definitively characterize (see coronal image 90/series 8). Similar 17 mm lesion identified interpolar left kidney on coronal 93/8. Tiny hypoattenuating lesions are noted in both kidneys, too small to characterize but likely benign. No evidence for hydroureter. The urinary bladder appears normal for the degree of distention. Stomach/Bowel: Stomach is unremarkable. No gastric wall thickening. No evidence of outlet obstruction. Duodenum is normally positioned as is the ligament of Treitz. No small bowel wall thickening. No small bowel dilatation. The terminal ileum is normal. The appendix is normal. No gross colonic mass. No colonic wall thickening. Diverticuli are seen scattered along the entire length of the colon without CT findings of diverticulitis. Lymphatic: Lymphadenopathy is identified in the gastrohepatic and hepatoduodenal ligaments. Index hepatoduodenal ligament lymph node measures 2 cm short axis. This lymph node was 1.5 cm short axis on the 10/14/2015 exam. Associated mesenteric and retroperitoneal lymphadenopathy evident. 2 cm short axis left para-aortic node visible on 122/5, this area was not included on previous chest CT. Pelvic sidewall lymphadenopathy noted bilaterally with left pelvic sidewall lymph  node measuring 1.3 cm short axis on 158/5. Reproductive: 5.5 x 4.4 cm soft tissue mass in the left pelvis. Left gonadal veins appear to arise directly from this lesion suggesting ovarian etiology. No right-sided adnexal mass. Uterus surgically absent. Other: No intraperitoneal free fluid. Musculoskeletal: Sclerotic lesion in the left iliac bone adjacent to the SI joint is likely a bone island. No worrisome lytic or sclerotic osseous abnormality. Review of the MIP images confirms the above findings. IMPRESSION: 1. No evidence for thoracic or abdominal aortic aneurysm or dissection. 2. Lymphadenopathy is identified in the chest, abdomen, and pelvis in this patient with reported history of CLL. Mediastinal lymphadenopathy is similar to prior chest CT of 2017. A hepatoduodenal ligament lymph node included on that prior chest CT has increased in size in the interval. Additional abdominopelvic lymphadenopathy was not included on available prior imaging. Close follow-up recommended and comparison to old studies suggested to assess for progression. 3. Left pelvic soft tissue mass measures 5.5 x 4.4 cm. Left gonadal veins appear to arise directly from this lesion suggesting ovarian etiology. Pelvic ultrasound may prove helpful to further evaluate. 4. Bilateral renal lesions with attenuation too high to be simple cysts. These may be cysts complicated by proteinaceous debris or hemorrhage, but MRI abdomen with and without contrast recommended to confirm. 5. Diffuse colonic diverticulosis without diverticulitis. 6. Stable appearance of small right-sided pulmonary nodules, consistent with benign etiology. 7. Aortic Atherosclerosis (ICD10-I70.0). Electronically Signed: By: Misty Stanley M.D. On: 03/09/2022 05:33   DG Chest 2 View  Result Date: 03/08/2022 CLINICAL DATA:  Epigastric pain. EXAM: CHEST - 2 VIEW COMPARISON:  Chest x-ray 08/07/2020.  CT of the chest 10/14/2015. FINDINGS: The heart size and mediastinal contours are  within normal limits. Both lungs are clear. The visualized skeletal structures are unremarkable. IMPRESSION: No active cardiopulmonary disease. Electronically Signed   By: Ronney Asters M.D.   On: 03/08/2022 22:18      Impression / Plan:   Assessment: Principal Problem:   Hypertensive urgency Active Problems:   CLL (chronic lymphocytic leukemia) ([place])  Tobacco abuse   Odynophagia   Coronary artery disease   Depression   History of CVA (cerebrovascular accident)   Paroxysmal atrial fibrillation (HCC)   Karen [name] is a 76 y.o. y/o female with dysphagia and odynophagia with and admission for increased systolic blood pressure in the 200s.  The patient's blood pressure is now under control.  I am now being asked to see the patient for her swallowing issues.  Plan:  The patient has been having odynophagia and dysphagia while on oral chemotherapy for CLL.  The patient may be suffering from candidal esophagitis which could explain both of these symptoms.  The patient will be set up for an EGD for today.  The patient has been explained the risks and benefits of the procedure.  The patient has also been told that we cannot take biopsies or dilated anything since she is presently on anticoagulation.  The patient has been explained the plan agrees with it.  Thank you for involving me in the care of this patient.      LOS: 0 days   Lucilla Lame, MD, [facility] 03/09/2022, 11:57 AM,  Pager [phone] 7am-5pm  Check AMION for 5pm -7am coverage and on weekends   Note: This dictation was prepared with Dragon dictation along with smaller phrase technology. Any transcriptional errors that result from this process are unintentional.

## 2022-03-09 NOTE — Assessment & Plan Note (Signed)
Stable Continue Zanubrutinib Follow-up with oncology as an outpatient

## 2022-03-09 NOTE — Anesthesia Preprocedure Evaluation (Addendum)
Anesthesia Evaluation  Patient identified by MRN, date of birth, ID band Patient awake    Reviewed: Allergy & Precautions, NPO status , Patient's Chart, lab work & pertinent test results  History of Anesthesia Complications Negative for: history of anesthetic complications  Airway Mallampati: IV  TM Distance: >3 FB Neck ROM: full    Dental  (+) Poor Dentition   Pulmonary sleep apnea , Current Smoker and Patient abstained from smoking.,    Pulmonary exam normal        Cardiovascular hypertension, + angina + CAD, + Past MI and +CHF  (-) Cardiac Stents Normal cardiovascular exam     Neuro/Psych PSYCHIATRIC DISORDERS Anxiety Depression CVA    GI/Hepatic Neg liver ROS, GERD  ,  Endo/Other  Hypothyroidism   Renal/GU Renal disease  negative genitourinary   Musculoskeletal   Abdominal   Peds  Hematology negative hematology ROS (+)   Anesthesia Other Findings Past Medical History: No date: Anxiety No date: Artery occlusion No date: Asthma No date: Back pain No date: Bronchitis No date: Cancer ([place])     Comment:  Skin Cancer No date: CLL (chronic lymphocytic leukemia) (HCC) No date: Coronary artery disease No date: Depression No date: Dyspnea No date: GERD (gastroesophageal reflux disease) No date: History of kidney stones No date: Hypercholesteremia No date: Hyperlipemia No date: Hypertension No date: Hypothyroidism 1999: Myocardial infarction ([place]) No date: Neuropathy No date: Restless leg syndrome No date: Sleep apnea     Comment:  use C-PAP No date: Wears dentures     Comment:  full upper, partial lower.  doesn't currently wear  Past Surgical History: 11/20/2016: COLONOSCOPY WITH PROPOFOL; N/A     Comment:  Procedure: COLONOSCOPY WITH PROPOFOL;  Surgeon: Lucilla Lame, MD;  Location: [facility];  Service:               Endoscopy;  Laterality: N/A;  sleep apnea 2008: CORONARY  ANGIOPLASTY WITH STENT PLACEMENT No date: DILATION AND CURETTAGE OF UTERUS No date: EYE SURGERY; Bilateral     Comment:  Cataract Extraction with IOL 12/2017: HAND SURGERY; Left     Comment:  upper hand fracture repair 06/29/2017: LEFT HEART CATH AND CORONARY ANGIOGRAPHY; N/A     Comment:  Procedure: LEFT HEART CATH AND CORONARY ANGIOGRAPHY;                Surgeon: Dionisio David, MD;  Location: [facility];  Service: Cardiovascular;  Laterality: N/A; 12/02/2018: LEFT HEART CATH AND CORONARY ANGIOGRAPHY; Left     Comment:  Procedure: LEFT HEART CATH AND CORONARY ANGIOGRAPHY;                Surgeon: Dionisio David, MD;  Location: [facility];  Service: Cardiovascular;  Laterality: Left; 08/12/2018: OPEN REDUCTION INTERNAL FIXATION (ORIF) DISTAL RADIAL  FRACTURE; Right     Comment:  Procedure: OPEN REDUCTION INTERNAL FIXATION (ORIF)               DISTAL RADIAL FRACTURE;  Surgeon: [name], MD;                Location: [facility];  Service: Orthopedics;  Laterality:               Right; 11/20/2016:  POLYPECTOMY     Comment:  Procedure: POLYPECTOMY;  Surgeon: Lucilla Lame, MD;                Location: [facility];  Service: Endoscopy;; No date: TUBAL LIGATION No date: VAGINAL HYSTERECTOMY 01/31/2016: VULVECTOMY; Right     Comment:  Procedure: WIDE EXCISION VULVECTOMY-RIGHT LABIA;                Surgeon: Brayton Mars, MD;  Location: [facility];                Service: Gynecology;  Laterality: Right;  BMI    Body Mass Index: 27.18 kg/m      Reproductive/Obstetrics negative OB ROS                            Anesthesia Physical Anesthesia Plan  ASA: 3  Anesthesia Plan: General   Post-op Pain Management: Minimal or no pain anticipated   Induction: Intravenous  PONV Risk Score and Plan: 3 and Propofol infusion, TIVA and Ondansetron  Airway Management Planned: Nasal Cannula  Additional Equipment:  None  Intra-op Plan:   Post-operative Plan:   Informed Consent: I have reviewed the patients History and Physical, chart, labs and discussed the procedure including the risks, benefits and alternatives for the proposed anesthesia with the patient or authorized representative who has indicated his/her understanding and acceptance.     Dental advisory given  Plan Discussed with: CRNA and Surgeon  Anesthesia Plan Comments: (Discussed risks of anesthesia with patient, including possibility of difficulty with spontaneous ventilation under anesthesia necessitating airway intervention, PONV, and rare risks such as cardiac or respiratory or neurological events, and allergic reactions. Discussed the role of CRNA in patient's perioperative care. Patient understands.)        Anesthesia Quick Evaluation

## 2022-03-09 NOTE — Transfer of Care (Signed)
Immediate Anesthesia Transfer of Care Note  Patient: Karen [name]  Procedure(s) Performed: ESOPHAGOGASTRODUODENOSCOPY (EGD) WITH PROPOFOL  Patient Location: PACU and Endoscopy Unit  Anesthesia Type:General  Level of Consciousness: drowsy  Airway & Oxygen Therapy: Patient Spontanous Breathing  Post-op Assessment: Report given to RN  Post vital signs: stable  Last Vitals:  Vitals Value Taken Time  BP    Temp    Pulse    Resp    SpO2      Last Pain:  Vitals:   03/09/22 1140  TempSrc: Temporal  PainSc: 4          Complications: No notable events documented.

## 2022-03-09 NOTE — Op Note (Signed)
[facility] Gastroenterology Patient Name: Karen [name] Procedure Date: 03/09/2022 12:01 PM MRN: [id number] Account #: [id number] Date of Birth: [birth year] Admit Type: Inpatient Age: 76 Room: [facility] ENDO ROOM 3 Gender: Female Note Status: Finalized Instrument Name: Upper Endoscope [id number] Procedure:             Upper GI endoscopy Indications:           Dysphagia, Odynophagia Providers:             Lucilla Lame MD, MD Referring MD:          Perrin Maltese, MD (Referring MD) Medicines:             Propofol per Anesthesia Complications:         No immediate complications. Procedure:             Pre-Anesthesia Assessment:                        - Prior to the procedure, a History and Physical was                         performed, and patient medications and allergies were                         reviewed. The patient's tolerance of previous                         anesthesia was also reviewed. The risks and benefits                         of the procedure and the sedation options and risks                         were discussed with the patient. All questions were                         answered, and informed consent was obtained. Prior                         Anticoagulants: The patient has taken no previous                         anticoagulant or antiplatelet agents. ASA Grade                         Assessment: II - A patient with mild systemic disease.                         After reviewing the risks and benefits, the patient                         was deemed in satisfactory condition to undergo the                         procedure.                        After obtaining informed consent, the endoscope was  passed under direct vision. Throughout the procedure,                         the patient's blood pressure, pulse, and oxygen                         saturations were monitored continuously. The Endoscope                         was  introduced through the mouth, and advanced to the                         second part of duodenum. The upper GI endoscopy was                         accomplished without difficulty. The patient tolerated                         the procedure well. Findings:      Non-severe esophagitis with no bleeding was found in the entire       esophagus.      The stomach was normal.      The examined duodenum was normal. Impression:            - Non-severe non-erosive esophagitis with no bleeding.                        - Normal stomach.                        - Normal examined duodenum.                        - No specimens collected due to the patient being on                         anticoagualtions.                        - No strictures or narrowing. Recommendation:        - Return patient to hospital ward for ongoing care.                        - Soft diet. Procedure Code(s):     --- Professional ---                        [phone], Esophagogastroduodenoscopy, flexible,                         transoral; diagnostic, including collection of                         specimen(s) by brushing or washing, when performed                         (separate procedure) Diagnosis Code(s):     --- Professional ---                        R13.10, Dysphagia, unspecified  K20.80, Other esophagitis without bleeding CPT copyright 2019 American Medical Association. All rights reserved. The codes documented in this report are preliminary and upon coder review may  be revised to meet current compliance requirements. Lucilla Lame MD, MD 03/09/2022 12:09:53 PM This report has been signed electronically. Number of Addenda: 0 Note Initiated On: 03/09/2022 12:01 PM Estimated Blood Loss:  Estimated blood loss: none.      [facility]

## 2022-03-09 NOTE — Assessment & Plan Note (Signed)
Continue metoprolol for rate control Hold Xarelto until evaluated by gastroenterology

## 2022-03-09 NOTE — Progress Notes (Signed)
EGD done.  Mild erythema though out the esophagus. No narrowing. No yeast seen. Soft diet. Repeat EGD when off anticoagulation with primary GI, Dr. Alice Reichert as out patient. Double dose PPI fpr now.  Dr. Allen Norris

## 2022-03-09 NOTE — Assessment & Plan Note (Addendum)
Patient presents to the ER for evaluation of a 2-day history of painful and difficulty swallowing both solids and liquids. She has a history of GERD and also complains of midsternal chest pain. We will place patient on IV Protonix Keep patient n.p.o. for now Consult GI for further evaluation to rule out reflux esophagitis as the cause of this patient's odynophagia

## 2022-03-09 NOTE — ED Notes (Signed)
RN introduced self to pt and IV medication started. Pt still c/o epigastric pain. Pt reports recently started on chemo pill for lymphoma cancers that caused a mass in her uterus.

## 2022-03-09 NOTE — Assessment & Plan Note (Signed)
Patient has a prior history of a CVA Hold aspirin and Plavix until patient is seen by GI Continue statin

## 2022-03-09 NOTE — ED Notes (Signed)
Patient in US at this time

## 2022-03-09 NOTE — Assessment & Plan Note (Signed)
Patient is an everyday smoker but declines a nicotine transdermal patch at this time. Smoking cessation counseling has been discussed with her in detail

## 2022-03-09 NOTE — Assessment & Plan Note (Signed)
Continue trazodone 

## 2022-03-10 ENCOUNTER — Encounter: Payer: Self-pay | Admitting: Gastroenterology

## 2022-03-10 DIAGNOSIS — F320 Major depressive disorder, single episode, mild: Secondary | ICD-10-CM | POA: Diagnosis not present

## 2022-03-10 DIAGNOSIS — I480 Paroxysmal atrial fibrillation: Secondary | ICD-10-CM

## 2022-03-10 DIAGNOSIS — Z720 Tobacco use: Secondary | ICD-10-CM

## 2022-03-10 DIAGNOSIS — R079 Chest pain, unspecified: Secondary | ICD-10-CM

## 2022-03-10 DIAGNOSIS — Z8673 Personal history of transient ischemic attack (TIA), and cerebral infarction without residual deficits: Secondary | ICD-10-CM

## 2022-03-10 DIAGNOSIS — R1310 Dysphagia, unspecified: Secondary | ICD-10-CM

## 2022-03-10 DIAGNOSIS — I2510 Atherosclerotic heart disease of native coronary artery without angina pectoris: Secondary | ICD-10-CM | POA: Diagnosis not present

## 2022-03-10 DIAGNOSIS — I160 Hypertensive urgency: Secondary | ICD-10-CM | POA: Diagnosis not present

## 2022-03-10 DIAGNOSIS — R1319 Other dysphagia: Secondary | ICD-10-CM

## 2022-03-10 LAB — BASIC METABOLIC PANEL
Anion gap: 6 (ref 5–15)
BUN: 18 mg/dL (ref 8–23)
CO2: 24 mmol/L (ref 22–32)
Calcium: 9.9 mg/dL (ref 8.9–10.3)
Chloride: 107 mmol/L (ref 98–111)
Creatinine, Ser: 1.48 mg/dL — ABNORMAL HIGH (ref 0.44–1.00)
GFR, Estimated: 36 mL/min — ABNORMAL LOW (ref >60)
Glucose, Bld: 113 mg/dL — ABNORMAL HIGH (ref 70–99)
Potassium: 4 mmol/L (ref 3.5–5.1)
Sodium: 137 mmol/L (ref 135–145)

## 2022-03-10 LAB — CBC WITH DIFFERENTIAL/PLATELET
Abs Immature Granulocytes: 0.1 10*3/uL — ABNORMAL HIGH (ref 0.00–0.07)
Basophils Absolute: 0.2 10*3/uL — ABNORMAL HIGH (ref 0.0–0.1)
Basophils Relative: 0 %
Eosinophils Absolute: 0.2 10*3/uL (ref 0.0–0.5)
Eosinophils Relative: 0 %
HCT: 37.5 % (ref 36.0–46.0)
Hemoglobin: 11.8 g/dL — ABNORMAL LOW (ref 12.0–15.0)
Immature Granulocytes: 0 %
Lymphocytes Relative: 91 %
Lymphs Abs: 51.5 10*3/uL — ABNORMAL HIGH (ref 0.7–4.0)
MCH: 29.2 pg (ref 26.0–34.0)
MCHC: 31.5 g/dL (ref 30.0–36.0)
MCV: 92.8 fL (ref 80.0–100.0)
Monocytes Absolute: 1 10*3/uL (ref 0.1–1.0)
Monocytes Relative: 2 %
Neutro Abs: 4.2 10*3/uL (ref 1.7–7.7)
Neutrophils Relative %: 7 %
Platelets: 183 10*3/uL (ref 150–400)
RBC: 4.04 MIL/uL (ref 3.87–5.11)
RDW: 14.2 % (ref 11.5–15.5)
Smear Review: ADEQUATE
WBC: 57.2 10*3/uL (ref 4.0–10.5)
nRBC: 0 % (ref 0.0–0.2)

## 2022-03-10 LAB — MAGNESIUM: Magnesium: 2.4 mg/dL (ref 1.7–2.4)

## 2022-03-10 LAB — CBC
HCT: 36.8 % (ref 36.0–46.0)
Hemoglobin: 11.6 g/dL — ABNORMAL LOW (ref 12.0–15.0)
MCH: 28.9 pg (ref 26.0–34.0)
MCHC: 31.5 g/dL (ref 30.0–36.0)
MCV: 91.5 fL (ref 80.0–100.0)
Platelets: 179 10*3/uL (ref 150–400)
RBC: 4.02 MIL/uL (ref 3.87–5.11)
RDW: 14.2 % (ref 11.5–15.5)
WBC: 55.3 10*3/uL (ref 4.0–10.5)
nRBC: 0 % (ref 0.0–0.2)

## 2022-03-10 LAB — PHOSPHORUS: Phosphorus: 4.3 mg/dL (ref 2.5–4.6)

## 2022-03-10 LAB — PATHOLOGIST SMEAR REVIEW

## 2022-03-10 MED ORDER — RIVAROXABAN 2.5 MG PO TABS
2.5000 mg | ORAL_TABLET | Freq: Two times a day (BID) | ORAL | Status: DC
Start: 2022-03-10 — End: 2022-03-10

## 2022-03-10 MED ORDER — ASPIRIN 81 MG PO TBEC
81.0000 mg | DELAYED_RELEASE_TABLET | Freq: Every day | ORAL | Status: DC
  Administered 2022-03-10: 81 mg via ORAL
  Filled 2022-03-10: qty 1

## 2022-03-10 MED ORDER — RIVAROXABAN 2.5 MG PO TABS: 2.5000 mg | ORAL_TABLET | Freq: Two times a day (BID) | ORAL | 0 refills | Status: DC

## 2022-03-10 MED ORDER — PANTOPRAZOLE SODIUM 40 MG PO TBEC: 40.0000 mg | DELAYED_RELEASE_TABLET | Freq: Two times a day (BID) | ORAL | 0 refills | Status: DC

## 2022-03-10 NOTE — Discharge Summary (Signed)
Physician Discharge Summary  Karen [name] YSA:[id number] DOB: May 05, 1946 DOA: 03/09/2022  PCP: Perrin Maltese, MD  Admit date: 03/09/2022 Discharge date: 03/12/2022  Time spent: 35 minutes  Recommendations for Outpatient Follow-up:   Hypertensive urgency -Patient was started on a Cardene drip in the ER -Continue metoprolol, lisinopril, amlodipine and nitrates and wean off Cardene drip as tolerated -Resolved   Odynophagia -Patient presents to the ER for evaluation of a 2-day history of painful and difficulty swallowing both solids and liquids. -Per GIMild erythema though out the esophagus. No narrowing. No yeast seen. Soft diet. Repeat EGD when off anticoagulation with primary GI, Dr. Alice Reichert as out patient. Double dose PPI fpr now.   Paroxysmal atrial fibrillation (HCC) -Continue metoprolol for rate control - Xarelto     History of CVA (cerebrovascular accident) -aspirin and Plavix  -Continue statin   Depression -Continue trazodone   Coronary artery disease -Stable -Continue nitrates, statins and beta-blockers   Tobacco abuse -Patient is an everyday smoker but declines a nicotine transdermal patch at this time. -Smoking cessation counseling has been discussed with her in detail   CLL (chronic lymphocytic leukemia) ([place]) Stable -Continue Zanubrutinib Follow-up with oncology as an outpatient   Latest Reference Range & Units 01/26/22 10:08 02/16/22 10:52 03/08/22 21:54 03/10/22 05:36 03/10/22 08:27  WBC 4.0 - 10.5 K/uL 58.5 (HH) 43.0 (H) 52.4 (HH) 55.3 (HH) 57.2 (HH)  (HH): Data is critically high (H): Data is abnormally high      Discharge Diagnoses:  Principal Problem:   Hypertensive urgency Active Problems:   Odynophagia   CLL (chronic lymphocytic leukemia) (HCC)   Tobacco abuse   Coronary artery disease   Depression   History of CVA (cerebrovascular accident)   Paroxysmal atrial fibrillation ([place])   Esophageal dysphagia   Discharge Condition:  Stable  Diet recommendation: Heart healthy  Filed Weights   03/08/22 2145 03/09/22 1140 03/09/22 1423  Weight: 67.4 kg 67.4 kg 68 kg    History of present illness:  [name] PMHx A-fib on chronic anticoagulation, history of CLL on oral chemotherapy, depression, coronary artery disease, history of CVA, hypothyroidism, OSA   Presents to the ER for evaluation of a 2-day history of pain and difficulty with swallowing.  Symptoms occur both with liquids and solids.  She also complains of midsternal chest pain.  She denies having any drooling, no fever, no chills, no cough, no shortness of breath, no changes in her bowel habits, no vomiting or diarrhea, no leg swelling, no headache, no palpitations, no dizziness, no lightheadedness, no blurred vision or focal deficit. Patient states that she was on a different oral chemotherapy which she was unable to tolerate due to difficulty swallowing and chart review shows that she was initially placed on acalabrutinib which was discontinued after patient complained of feeling like " her throat was closing up" Her blood pressure was significantly elevated in the ER with systolic blood pressure greater than 200 and she received a dose of hydralazine 10 mg IV, amlodipine 5 mg as well as lisinopril 40 mg without any significant improvement in her blood pressure.  She was then started on a Cardene drip. She will be admitted to the hospital for further evaluation.  Hospital Course:  See above  Procedures: 8/17 EGD  - Non-severe non-erosive esophagitis with no bleeding.                        - Normal stomach.                        -  Normal examined duodenum.                        - No specimens collected due to the patient being on                         anticoagualtions.                        - No strictures or narrowing.  Consultations: GI    Discharge Exam: Vitals:   03/09/22 2345 03/10/22 0416 03/10/22 0826 03/10/22 1149  BP: (!) 107/58 119/63  (!) 120/54 (!) 120/56  Pulse: (!) 52 (!) 49 (!) 50 (!) 51  Resp: 18 18 16 16   Temp: 98.5 F (36.9 C) 98 F (36.7 C) 98.8 F (37.1 C) 98.8 F (37.1 C)  TempSrc:   Oral Oral  SpO2: 95% 95% 95% 97%  Weight:      Height:        General: No acute respiratory distress Eyes: negative scleral hemorrhage, negative anisocoria, negative icterus ENT: Negative Runny nose, negative gingival bleeding, Neck:  Negative scars, masses, torticollis, lymphadenopathy, JVD Lungs: Clear to auscultation bilaterally without wheezes or crackles Cardiovascular: Regular rate and rhythm without murmur gallop or rub normal S1 and S2  Discharge Instructions   Allergies as of 03/10/2022       Reactions   Requip [ropinirole Hcl] Anaphylaxis   Throat closing   Celebrex [celecoxib] Itching   Meloxicam Palpitations   Nitrofurantoin Palpitations        Medication List     STOP taking these medications    cilostazol 100 MG tablet Commonly known as: PLETAL   clopidogrel 75 MG tablet Commonly known as: PLAVIX   dexlansoprazole 60 MG capsule Commonly known as: DEXILANT   gabapentin 400 MG capsule Commonly known as: NEURONTIN       TAKE these medications    acetaminophen 500 MG tablet Commonly known as: TYLENOL Take 500-1,000 mg by mouth every 6 (six) hours as needed for moderate pain or fever.   acetaminophen-codeine 300-30 MG tablet Commonly known as: TYLENOL #3 Take 1 tablet by mouth every 12 (twelve) hours as needed for moderate pain or severe pain.   albuterol 108 (90 Base) MCG/ACT inhaler Commonly known as: VENTOLIN HFA Inhale 1-2 puffs into the lungs every 6 (six) hours as needed for wheezing or shortness of breath.   amLODipine 5 MG tablet Commonly known as: NORVASC Take 5 mg by mouth daily. 1 Tablet(s) By Mouth Daily   aspirin EC 81 MG tablet Take 1 tablet (81 mg total) by mouth daily.   atorvastatin 40 MG tablet Commonly known as: LIPITOR Take 1 tablet (40 mg total) by  mouth daily.   Brukinsa 80 MG capsule Generic drug: zanubrutinib Take 2 capsules (160 mg total) by mouth 2 (two) times daily.   furosemide 20 MG tablet Commonly known as: LASIX Take 20 mg by mouth daily.   isosorbide mononitrate 120 MG 24 hr tablet Commonly known as: IMDUR Take 120 mg by mouth daily. What changed: Another medication with the same name was removed. Continue taking this medication, and follow the directions you see here.   lisinopril 40 MG tablet Commonly known as: ZESTRIL Hold until followup with outpatient provider due to acute kidney injury.   metoprolol succinate 100 MG 24 hr tablet Commonly known as: TOPROL-XL Take 100 mg by mouth daily. What changed: Another medication  with the same name was removed. Continue taking this medication, and follow the directions you see here.   ondansetron 4 MG tablet Commonly known as: Zofran Take 1 tablet (4 mg total) by mouth every 8 (eight) hours as needed for nausea or vomiting.   pantoprazole 40 MG tablet Commonly known as: Protonix Take 1 tablet (40 mg total) by mouth 2 (two) times daily.   Premarin vaginal cream Generic drug: conjugated estrogens USE 1 GRAM INTRAVAGINALLY TWICE PER WEEK   rivaroxaban 2.5 MG Tabs tablet Commonly known as: XARELTO Take 1 tablet (2.5 mg total) by mouth 2 (two) times daily. What changed: when to take this   traZODone 50 MG tablet Commonly known as: DESYREL Take 50 mg by mouth at bedtime.       Allergies  Allergen Reactions   Requip [Ropinirole Hcl] Anaphylaxis    Throat closing   Celebrex [Celecoxib] Itching   Meloxicam Palpitations   Nitrofurantoin Palpitations    Follow-up Information     Schedule an appointment as soon as possible for a visit  with Efrain Sella, MD.   Specialty: Gastroenterology Contact information: [place] [address] [phone]                  The results of significant diagnostics from this  hospitalization (including imaging, microbiology, ancillary and laboratory) are listed below for reference.    Significant Diagnostic Studies: US PELVIC COMPLETE W TRANSVAGINAL AND TORSION R/O  Result Date: 03/09/2022 CLINICAL DATA:  Pelvic mass seen on CT. EXAM: TRANSABDOMINAL AND TRANSVAGINAL ULTRASOUND OF PELVIS DOPPLER ULTRASOUND OF OVARIES TECHNIQUE: Both transabdominal and transvaginal ultrasound examinations of the pelvis were performed. Transabdominal technique was performed for global imaging of the pelvis including uterus, ovaries, adnexal regions, and pelvic cul-de-sac. It was necessary to proceed with endovaginal exam following the transabdominal exam to visualize the left ovary. Color and duplex Doppler ultrasound was utilized to evaluate blood flow to the ovaries. COMPARISON:  CT scan earlier same day FINDINGS: Uterus Measurements: Surgically absent. Endometrium Thickness: N/A. Right ovary Measurements: 2.6 x 1.8 x 1.7 cm = volume: 4.3 mL. Normal appearance/no adnexal mass. Left ovary Measurements: 5.1 x 3.4 x 4.3 cm = volume: 36 mL. Enlarged left ovary with homogeneous solid soft tissue echogenicity. Pulsed Doppler evaluation of both ovaries demonstrates normal low-resistance arterial and venous waveforms. Other findings No abnormal free fluid. IMPRESSION: 1. 5.1 cm solid soft tissue mass in the left adnexa likely representing left ovarian origin. Given that this patient has a reported history of CLL, there is likely prior imaging available outside are system. Consider comparison to previous outside studies to assess chronicity of this finding. 2. No evidence for ovarian torsion. 3. Status post hysterectomy. Electronically Signed   By: Misty Stanley M.D.   On: 03/09/2022 07:52   CT Angio Chest/Abd/Pel for Dissection W and/or Wo Contrast  Addendum Date: 03/09/2022   ADDENDUM REPORT: 03/09/2022 05:48 ADDENDUM: Not readily apparent on axial images, coronal images demonstrate a mild beaded  appearance of the right renal artery suggesting FMD. This does not appear to result in substantial stenosis at this time. Electronically Signed   By: Misty Stanley M.D.   On: 03/09/2022 05:48   Result Date: 03/09/2022 CLINICAL DATA:  Trouble swallowing and painful swallowing. Pain radiates to epigastric area. Personal history of CLL. * Tracking Code: BO * EXAM: CT ANGIOGRAPHY CHEST, ABDOMEN AND PELVIS TECHNIQUE: Non-contrast CT of the chest was initially obtained. Multidetector CT imaging through the chest,  abdomen and pelvis was performed using the standard protocol during bolus administration of intravenous contrast. Multiplanar reconstructed images and MIPs were obtained and reviewed to evaluate the vascular anatomy. RADIATION DOSE REDUCTION: This exam was performed according to the departmental dose-optimization program which includes automated exposure control, adjustment of the mA and/or kV according to patient size and/or use of iterative reconstruction technique. CONTRAST:  162mL OMNIPAQUE IOHEXOL 350 MG/ML SOLN COMPARISON:  Chest CT 10/14/2015 FINDINGS: CTA CHEST FINDINGS Cardiovascular: Pre contrast imaging shows no hyperdense crescent in the wall of the thoracic aorta to suggest the presence of an acute intramural hematoma. No thoracic aortic aneurysm. No dissection of the thoracic aorta. Pulmonary arteries are well opacified without evidence for pulmonary embolus Mediastinum/Nodes: A 12 mm short axis precarinal lymph node is stable since 10/14/2015 when remeasured in a similar fashion on that prior study. No other mediastinal lymphadenopathy. No hilar lymphadenopathy. The esophagus has normal imaging features. There is no axillary lymphadenopathy. Lungs/Pleura: 4 mm right upper lobe nodule on 26/7 is stable since the 2017 exam 7 mm perifissural nodule posterior right middle lobe is also unchanged since that prior study. Given interval stability of these nodules, no specific imaging follow-up is  warranted. No focal airspace consolidation. There is no evidence of pleural effusion. Musculoskeletal: No worrisome lytic or sclerotic osseous abnormality. Review of the MIP images confirms the above findings. CTA ABDOMEN AND PELVIS FINDINGS VASCULAR Aorta: Normal caliber aorta without aneurysm, dissection, vasculitis or significant stenosis. Moderate atherosclerotic calcification evident. Celiac: Patent without evidence of aneurysm, dissection, vasculitis or significant stenosis. Variant hepatic artery anatomy with left hepatic artery only arising from the celiac axis and another left hepatic artery arising from the SMA. SMA: Patent without evidence of aneurysm, dissection, vasculitis or significant stenosis. Variant hepatic artery anatomy with replaced right hepatic artery arising from the SMA. As above, 1 of 2 left hepatic arteries arises from the SMA. Renals: Both renal arteries are patent without evidence of aneurysm, dissection, vasculitis, fibromuscular dysplasia or significant stenosis. IMA: Patent without evidence of aneurysm, dissection, vasculitis or significant stenosis. Inflow: Patent without evidence of aneurysm, dissection, vasculitis or significant stenosis. Veins: No obvious venous abnormality within the limitations of this arterial phase study. Review of the MIP images confirms the above findings. NON-VASCULAR Hepatobiliary: 9 mm hypoattenuating lesion in the dome of the lateral segment left liver is stable since the 2017 exam consistent with benign etiology. No followup recommended. There is no evidence for gallstones, gallbladder wall thickening, or pericholecystic fluid. No intrahepatic or extrahepatic biliary dilation. Pancreas: No focal mass lesion. No dilatation of the main duct. No intraparenchymal cyst. No peripancreatic edema. Spleen: No splenomegaly. No focal mass lesion. Adrenals/Urinary Tract: No adrenal nodule or mass. Cortical scarring noted both kidneys without hydronephrosis. 1.8 cm  lesion upper pole right kidney has attenuation too high to be a simple cyst but cannot be definitively characterize (see coronal image 90/series 8). Similar 17 mm lesion identified interpolar left kidney on coronal 93/8. Tiny hypoattenuating lesions are noted in both kidneys, too small to characterize but likely benign. No evidence for hydroureter. The urinary bladder appears normal for the degree of distention. Stomach/Bowel: Stomach is unremarkable. No gastric wall thickening. No evidence of outlet obstruction. Duodenum is normally positioned as is the ligament of Treitz. No small bowel wall thickening. No small bowel dilatation. The terminal ileum is normal. The appendix is normal. No gross colonic mass. No colonic wall thickening. Diverticuli are seen scattered along the entire length of the colon without  CT findings of diverticulitis. Lymphatic: Lymphadenopathy is identified in the gastrohepatic and hepatoduodenal ligaments. Index hepatoduodenal ligament lymph node measures 2 cm short axis. This lymph node was 1.5 cm short axis on the 10/14/2015 exam. Associated mesenteric and retroperitoneal lymphadenopathy evident. 2 cm short axis left para-aortic node visible on 122/5, this area was not included on previous chest CT. Pelvic sidewall lymphadenopathy noted bilaterally with left pelvic sidewall lymph node measuring 1.3 cm short axis on 158/5. Reproductive: 5.5 x 4.4 cm soft tissue mass in the left pelvis. Left gonadal veins appear to arise directly from this lesion suggesting ovarian etiology. No right-sided adnexal mass. Uterus surgically absent. Other: No intraperitoneal free fluid. Musculoskeletal: Sclerotic lesion in the left iliac bone adjacent to the SI joint is likely a bone island. No worrisome lytic or sclerotic osseous abnormality. Review of the MIP images confirms the above findings. IMPRESSION: 1. No evidence for thoracic or abdominal aortic aneurysm or dissection. 2. Lymphadenopathy is identified  in the chest, abdomen, and pelvis in this patient with reported history of CLL. Mediastinal lymphadenopathy is similar to prior chest CT of 2017. A hepatoduodenal ligament lymph node included on that prior chest CT has increased in size in the interval. Additional abdominopelvic lymphadenopathy was not included on available prior imaging. Close follow-up recommended and comparison to old studies suggested to assess for progression. 3. Left pelvic soft tissue mass measures 5.5 x 4.4 cm. Left gonadal veins appear to arise directly from this lesion suggesting ovarian etiology. Pelvic ultrasound may prove helpful to further evaluate. 4. Bilateral renal lesions with attenuation too high to be simple cysts. These may be cysts complicated by proteinaceous debris or hemorrhage, but MRI abdomen with and without contrast recommended to confirm. 5. Diffuse colonic diverticulosis without diverticulitis. 6. Stable appearance of small right-sided pulmonary nodules, consistent with benign etiology. 7. Aortic Atherosclerosis (ICD10-I70.0). Electronically Signed: By: Misty Stanley M.D. On: 03/09/2022 05:33   DG Chest 2 View  Result Date: 03/08/2022 CLINICAL DATA:  Epigastric pain. EXAM: CHEST - 2 VIEW COMPARISON:  Chest x-ray 08/07/2020.  CT of the chest 10/14/2015. FINDINGS: The heart size and mediastinal contours are within normal limits. Both lungs are clear. The visualized skeletal structures are unremarkable. IMPRESSION: No active cardiopulmonary disease. Electronically Signed   By: Ronney Asters M.D.   On: 03/08/2022 22:18    Microbiology: No results found for this or any previous visit (from the past 240 hour(s)).   Labs: Basic Metabolic Panel: Recent Labs  Lab 03/08/22 2154 03/10/22 0536 03/10/22 0827  NA 137 137  --   K 3.9 4.0  --   CL 103 107  --   CO2 27 24  --   GLUCOSE 97 113*  --   BUN 12 18  --   CREATININE 0.97 1.48*  --   CALCIUM 9.9 9.9  --   MG  --   --  2.4  PHOS  --   --  4.3   Liver  Function Tests: Recent Labs  Lab 03/08/22 2154  AST 15  ALT 10  ALKPHOS 70  BILITOT 0.4  PROT 6.9  ALBUMIN 4.1   Recent Labs  Lab 03/08/22 2154  LIPASE 48   No results for input(s): "AMMONIA" in the last 168 hours. CBC: Recent Labs  Lab 03/08/22 2154 03/10/22 0536 03/10/22 0827  WBC 52.4* 55.3* 57.2*  NEUTROABS  --   --  4.2  HGB 11.9* 11.6* 11.8*  HCT 38.0 36.8 37.5  MCV 93.1 91.5 92.8  PLT 172 179 183   Cardiac Enzymes: No results for input(s): "CKTOTAL", "CKMB", "CKMBINDEX", "TROPONINI" in the last 168 hours. BNP: BNP (last 3 results) No results for input(s): "BNP" in the last 8760 hours.  ProBNP (last 3 results) No results for input(s): "PROBNP" in the last 8760 hours.  CBG: No results for input(s): "GLUCAP" in the last 168 hours.     Signed:  Dia Crawford, MD [facility]

## 2022-03-10 NOTE — Progress Notes (Signed)
   03/10/22 1000  Clinical Encounter Type  Visited With Patient  Visit Type Initial  Referral From Nurse  Consult/Referral To Chaplain   Chaplain visited with patient. She spoke about her health challenges and her three daughter and grandchildren. Patient appreciated [place] visit.

## 2022-03-10 NOTE — TOC CM/SW Note (Signed)
Patient has orders to discharge home today. Chart reviewed. PCP is Lamonte Sakai, MD. On room air. No wounds. No TOC needs identified. CSW signing off.  Dayton Scrape, [place]

## 2022-03-10 NOTE — Anesthesia Postprocedure Evaluation (Signed)
Anesthesia Post Note  Patient: Karen [name]  Procedure(s) Performed: ESOPHAGOGASTRODUODENOSCOPY (EGD) WITH PROPOFOL  Patient location during evaluation: Endoscopy Anesthesia Type: General Level of consciousness: awake and alert Pain management: pain level controlled Vital Signs Assessment: post-procedure vital signs reviewed and stable Respiratory status: spontaneous breathing, nonlabored ventilation, respiratory function stable and patient connected to nasal cannula oxygen Cardiovascular status: blood pressure returned to baseline and stable Postop Assessment: no apparent nausea or vomiting Anesthetic complications: no   No notable events documented.   Last Vitals:  Vitals:   03/10/22 0826 03/10/22 1149  BP: (!) 120/54 (!) 120/56  Pulse: (!) 50 (!) 51  Resp: 16 16  Temp: 37.1 C 37.1 C  SpO2: 95% 97%    Last Pain:  Vitals:   03/10/22 1149  TempSrc: Oral  PainSc:                  Dimas Millin

## 2022-03-10 NOTE — Progress Notes (Signed)
Mobility Specialist - Progress Note     03/10/22 1448  Mobility  Activity Ambulated independently in hallway  Level of Assistance Modified independent, requires aide device or extra time  Assistive Device None  Distance Ambulated (ft) 360 ft  Activity Response Tolerated well  $Mobility charge 1 Mobility   Pt supine upon entry, utilizing RA. Pt transferred to EOB and stood independently. Pt ambulated two laps around NS without AD. Pt left in chair with alarm set and needs in reach. No complaints.   [place]ie [place] Mobility Specialist 03/10/22 2:53 PM

## 2022-03-10 NOTE — Plan of Care (Signed)

## 2022-03-16 ENCOUNTER — Inpatient Hospital Stay (HOSPITAL_BASED_OUTPATIENT_CLINIC_OR_DEPARTMENT_OTHER): Payer: Medicare Other | Admitting: Oncology

## 2022-03-16 ENCOUNTER — Inpatient Hospital Stay: Payer: Medicare Other | Admitting: Pharmacist

## 2022-03-16 ENCOUNTER — Inpatient Hospital Stay: Payer: Medicare Other | Attending: Oncology

## 2022-03-16 ENCOUNTER — Other Ambulatory Visit: Payer: Self-pay

## 2022-03-16 ENCOUNTER — Encounter: Payer: Self-pay | Admitting: Oncology

## 2022-03-16 ENCOUNTER — Other Ambulatory Visit: Payer: Self-pay | Admitting: Internal Medicine

## 2022-03-16 VITALS — BP 156/78 | HR 43 | Temp 96.4°F | Resp 18 | Wt 145.7 lb

## 2022-03-16 DIAGNOSIS — R1900 Intra-abdominal and pelvic swelling, mass and lump, unspecified site: Secondary | ICD-10-CM | POA: Diagnosis not present

## 2022-03-16 DIAGNOSIS — C9110 Chronic lymphocytic leukemia of B-cell type not having achieved remission: Secondary | ICD-10-CM | POA: Diagnosis not present

## 2022-03-16 DIAGNOSIS — K209 Esophagitis, unspecified without bleeding: Secondary | ICD-10-CM | POA: Diagnosis not present

## 2022-03-16 DIAGNOSIS — Z1231 Encounter for screening mammogram for malignant neoplasm of breast: Secondary | ICD-10-CM

## 2022-03-16 LAB — COMPREHENSIVE METABOLIC PANEL
ALT: 9 U/L (ref 0–44)
AST: 12 U/L — ABNORMAL LOW (ref 15–41)
Albumin: 3.9 g/dL (ref 3.5–5.0)
Alkaline Phosphatase: 64 U/L (ref 38–126)
Anion gap: 5 (ref 5–15)
BUN: 10 mg/dL (ref 8–23)
CO2: 26 mmol/L (ref 22–32)
Calcium: 9.1 mg/dL (ref 8.9–10.3)
Chloride: 105 mmol/L (ref 98–111)
Creatinine, Ser: 1.16 mg/dL — ABNORMAL HIGH (ref 0.44–1.00)
GFR, Estimated: 49 mL/min — ABNORMAL LOW (ref >60)
Glucose, Bld: 117 mg/dL — ABNORMAL HIGH (ref 70–99)
Potassium: 3.8 mmol/L (ref 3.5–5.1)
Sodium: 136 mmol/L (ref 135–145)
Total Bilirubin: 0.2 mg/dL — ABNORMAL LOW (ref 0.3–1.2)
Total Protein: 6.6 g/dL (ref 6.5–8.1)

## 2022-03-16 LAB — CBC WITH DIFFERENTIAL/PLATELET
Abs Immature Granulocytes: 0.04 10*3/uL (ref 0.00–0.07)
Basophils Absolute: 0.1 10*3/uL (ref 0.0–0.1)
Basophils Relative: 0 %
Eosinophils Absolute: 0.2 10*3/uL (ref 0.0–0.5)
Eosinophils Relative: 1 %
HCT: 36.7 % (ref 36.0–46.0)
Hemoglobin: 11.6 g/dL — ABNORMAL LOW (ref 12.0–15.0)
Immature Granulocytes: 0 %
Lymphocytes Relative: 92 %
Lymphs Abs: 37 10*3/uL — ABNORMAL HIGH (ref 0.7–4.0)
MCH: 29.7 pg (ref 26.0–34.0)
MCHC: 31.6 g/dL (ref 30.0–36.0)
MCV: 93.9 fL (ref 80.0–100.0)
Monocytes Absolute: 0.9 10*3/uL (ref 0.1–1.0)
Monocytes Relative: 2 %
Neutro Abs: 1.9 10*3/uL (ref 1.7–7.7)
Neutrophils Relative %: 5 %
Platelets: 187 10*3/uL (ref 150–400)
RBC: 3.91 MIL/uL (ref 3.87–5.11)
RDW: 14 % (ref 11.5–15.5)
Smear Review: NORMAL
WBC: 40.2 10*3/uL — ABNORMAL HIGH (ref 4.0–10.5)
nRBC: 0 % (ref 0.0–0.2)

## 2022-03-16 NOTE — Progress Notes (Signed)
Pt reports that after taking Brukinsa, she had trouble swallowing and breathing, she had to go to ER. She also reports that BP was elevated while on medication.

## 2022-03-16 NOTE — Progress Notes (Signed)
Patient has not been taking her zanubrutinib, therefore not seen today.

## 2022-03-17 DIAGNOSIS — K209 Esophagitis, unspecified without bleeding: Secondary | ICD-10-CM | POA: Insufficient documentation

## 2022-03-17 LAB — MISC LABCORP TEST (SEND OUT): Labcorp test code: 81610

## 2022-03-17 NOTE — Progress Notes (Addendum)
Hematology/Oncology Progress note [phone] [phone]         Patient Care Team: Perrin Maltese, MD as PCP - General (Internal Medicine) Earlie Server, MD as Consulting Physician (Hematology and Oncology)  ASSESSMENT & PLAN:   CLL (chronic lymphocytic leukemia) [facility]) Did not tolerate acalabrutinib due to possible allergic reaction " throat closing up". Retrospectively maybe due to esophagitis symptoms.' Not able to swallow Zanubrutinib, currently off. She is not interested in other intravenous treatment options.  Plan to restart after her esophageal symptoms improves.   Pelvis mass, . Recent images indicate ovarian origin. Check CA 125 and HE4. Refer to [facility]  Esophagitis I discussed with [facility] team. Patient will see GI for further evaluation and management of her dysphagia due to esophagitis.  Plan repeat EGD while holding her anticoagulation. Continue PPI  Orders Placed This Encounter  Procedures   CA 125    Standing Status:   Future    Number of Occurrences:   1    Standing Expiration Date:   03/16/2023   Ovarian Cancer Monitor    Standing Status:   Future    Standing Expiration Date:   03/17/2023   Miscellaneous [facility] test (send-out)    Standing Status:   Future    Number of Occurrences:   1    Standing Expiration Date:   03/17/2023    Order Specific Question:   Test name / description:    Answer:   [id number] cancer monitor    Follow-up  To be determined.   . All questions were answered. The patient knows to call the clinic with any problems, questions or concerns.  Earlie Server, MD, PhD [facility] Hematology Oncology 03/16/2022     REASON FOR VISIT Follow up for treatment of CLL  HISTORY OF PRESENTING ILLNESS:  Karen [name] is a  76 y.o.  female with PMH listed below who presents for follow-up of CLL treatments. Oncology history summary listed as below. Oncology History  CLL (chronic lymphocytic leukemia) ([place])  09/24/2015  Imaging   10/14/2015 chest without contrast Images were independently reviewed by me Small pulmonary nodules are stable over the past year.  Considered benign.  Several lymph nodes are borderline enlarged, including the lower right paratracheal lymph node and a portacaval lymph node. 08/27/2017 US abdomen complete, no hepato-splenomegaly.   12/18/2017 Initial Diagnosis   CLL (chronic lymphocytic leukemia)   07/30/2017, peripheral blood flow cytometry showed CD5+, CD23+, CD38 negative monoclonal B-cell population consistent with a diagnosis of CLL   06/27/2019 Cancer Staging   Staging form: Chronic Lymphocytic Leukemia / Small Lymphocytic Lymphoma, AJCC 8th Edition - Clinical: Modified Rai Stage I (Modified Rai risk: Intermediate, Lymphocytosis: Present, Adenopathy: Present, Organomegaly: Absent, Anemia: Absent, Thrombocytopenia: Absent) - Signed by Earlie Server, MD on 12/06/2021   11/27/2021 Imaging   Outside facility CT Aorta and run off w wo  showed moderate atherosclerotic aortic disease.  Diffuse lightest of moderate disease of the bilateral [place] vasculature.Diffuse mild stenotic disease of the bilateral SFAs. In addition, patient has soft tissue mass/lymph node along the left pelvic sidewall measures 4.9 x 4.7 cm, numerous peritoneum subcu tissue nodule, a deep right pelvic nodule and numerous enlarged mesenteric and retroperitoneal lymph nodes are identified.  For example a right [place] mesenteric lymph node measures 2.4 x 2.2 cm.  The left periaortic lymph node measures 4.4 x 2.0 cm.  A pericardial lymph node measures 2.6 x 1.3 cm.   01/03/2022 Pathology Results   Right abdomen lymph  node biopsy showed chronic lymphocytic leukemia/small lymphocytic lymphoma    01/09/2022 - 02/06/2022 Chemotherapy   Acalabrutinib 100 mg twice daily  Stopped due to sensation of throat closing.    Patient was taking Acalabrutinib 100 mg twice daily she developed sensation of throat closing and was advised to  stop acalabrutinib. No other side effects.   Review of Systems  Constitutional:  Positive for fatigue. Negative for appetite change, chills, fever and unexpected weight change.  HENT:   Negative for hearing loss and voice change.   Eyes:  Negative for eye problems.  Respiratory:  Negative for chest tightness and cough.   Cardiovascular:  Negative for chest pain.  Gastrointestinal:  Negative for abdominal distention, abdominal pain, blood in stool and nausea.  Endocrine: Negative for hot flashes.  Genitourinary:  Negative for difficulty urinating and frequency.   Musculoskeletal:  Positive for back pain. Negative for arthralgias.  Skin:  Negative for itching and rash.  Neurological:  Negative for extremity weakness and headaches.  Hematological:  Negative for adenopathy.  Psychiatric/Behavioral:  Negative for confusion.      MEDICAL HISTORY:  Past Medical History:  Diagnosis Date   Anxiety    Artery occlusion    Asthma    Back pain    Bronchitis    Cancer (HCC)    Skin Cancer   CLL (chronic lymphocytic leukemia) (HCC)    Coronary artery disease    Depression    Dyspnea    GERD (gastroesophageal reflux disease)    History of kidney stones    Hypercholesteremia    Hyperlipemia    Hypertension    Hypothyroidism    Myocardial infarction ([name]) 1999   Neuropathy    Restless leg syndrome    Sleep apnea    use C-PAP   Wears dentures    full upper, partial lower.  doesn't currently wear    SURGICAL HISTORY: Past Surgical History:  Procedure Laterality Date   COLONOSCOPY WITH PROPOFOL N/A 11/20/2016   Procedure: COLONOSCOPY WITH PROPOFOL;  Surgeon: Lucilla Lame, MD;  Location: [place];  Service: Endoscopy;  Laterality: N/A;  sleep apnea   CORONARY ANGIOPLASTY WITH STENT PLACEMENT  2008   DILATION AND CURETTAGE OF UTERUS     ESOPHAGOGASTRODUODENOSCOPY (EGD) WITH PROPOFOL N/A 03/09/2022   Procedure: ESOPHAGOGASTRODUODENOSCOPY (EGD) WITH PROPOFOL;  Surgeon: Lucilla Lame, MD;  Location: [facility];  Service: Endoscopy;  Laterality: N/A;   EYE SURGERY Bilateral    Cataract Extraction with IOL   HAND SURGERY Left 12/2017   upper hand fracture repair   LEFT HEART CATH AND CORONARY ANGIOGRAPHY N/A 06/29/2017   Procedure: LEFT HEART CATH AND CORONARY ANGIOGRAPHY;  Surgeon: Dionisio David, MD;  Location: [place] [facility];  Service: Cardiovascular;  Laterality: N/A;   LEFT HEART CATH AND CORONARY ANGIOGRAPHY Left 12/02/2018   Procedure: LEFT HEART CATH AND CORONARY ANGIOGRAPHY;  Surgeon: Dionisio David, MD;  Location: [place] [facility];  Service: Cardiovascular;  Laterality: Left;   OPEN REDUCTION INTERNAL FIXATION (ORIF) DISTAL RADIAL FRACTURE Right 08/12/2018   Procedure: OPEN REDUCTION INTERNAL FIXATION (ORIF) DISTAL RADIAL FRACTURE;  Surgeon: [name], MD;  Location: [facility];  Service: Orthopedics;  Laterality: Right;   POLYPECTOMY  11/20/2016   Procedure: POLYPECTOMY;  Surgeon: Lucilla Lame, MD;  Location: [place];  Service: Endoscopy;;   TUBAL LIGATION     VAGINAL HYSTERECTOMY     VULVECTOMY Right 01/31/2016   Procedure: WIDE EXCISION VULVECTOMY-RIGHT LABIA;  Surgeon: Alanda Slim  Defrancesco, MD;  Location: [facility];  Service: Gynecology;  Laterality: Right;    SOCIAL HISTORY: Social History   Socioeconomic History   Marital status: Single    Spouse name: Not on file   Number of children: 5   Years of education: Not on file   Highest education level: Not on file  Occupational History   Occupation: part time job    Comment: [employer]  Tobacco Use   Smoking status: Every Day    Packs/day: 1.00    Years: 35.00    Total pack years: 35.00    Types: Cigarettes   Smokeless tobacco: Current    Types: Snuff  Vaping Use   Vaping Use: Never used  Substance and Sexual Activity   Alcohol use: No    Alcohol/week: 0.0 standard drinks of alcohol   Drug use: No   Sexual activity: Not Currently    Partners: Male    Birth  control/protection: Surgical  Other Topics Concern   Not on file  Social History Narrative   Not on file   Social Determinants of Health   Financial Resource Strain: Low Risk  (12/02/2018)   Overall Financial Resource Strain (CARDIA)    Difficulty of Paying Living Expenses: Not very hard  Food Insecurity: No Food Insecurity (12/02/2018)   Hunger Vital Sign    Worried About Running Out of Food in the Last Year: Never true    Ran Out of Food in the Last Year: Never true  Transportation Needs: No Transportation Needs (12/02/2018)   PRAPARE - Hydrologist (Medical): No    Lack of Transportation (Non-Medical): No  Physical Activity: Inactive (12/18/2017)   Exercise Vital Sign    Days of Exercise per Week: 0 days    Minutes of Exercise per Session: 0 min  Stress: No Stress Concern Present (12/02/2018)   [place]    Feeling of Stress : Only a little  Social Connections: Unknown (12/02/2018)   Social Connection and Isolation Panel [NHANES]    Frequency of Communication with Friends and Family: More than three times a week    Frequency of Social Gatherings with Friends and Family: Not on file    Attends Religious Services: Not on file    Active Member of Clubs or Organizations: Not on file    Attends Archivist Meetings: Not on file    Marital Status: Not on file  Intimate Partner Violence: Not At Risk (12/02/2018)   Humiliation, Afraid, Rape, and Kick questionnaire    Fear of Current or Ex-Partner: No    Emotionally Abused: No    Physically Abused: No    Sexually Abused: No    FAMILY HISTORY: Family History  Problem Relation Age of Onset   Heart disease Father    Stroke Mother    Hypertension Mother    Lung cancer Brother    Heart attack Brother    Congestive Heart Failure Sister    Cervical cancer Sister    Cancer Neg Hx    Diabetes Neg Hx    Breast cancer Neg Hx      ALLERGIES:  is allergic to requip [ropinirole hcl], celebrex [celecoxib], meloxicam, and nitrofurantoin.  MEDICATIONS:  Current Outpatient Medications  Medication Sig Dispense Refill   acetaminophen (TYLENOL) 500 MG tablet Take 500-1,000 mg by mouth every 6 (six) hours as needed for moderate pain or fever.     albuterol (VENTOLIN HFA) 108 (  90 Base) MCG/ACT inhaler Inhale 1-2 puffs into the lungs every 6 (six) hours as needed for wheezing or shortness of breath.     amLODipine (NORVASC) 5 MG tablet Take 5 mg by mouth daily. 1 Tablet(s) By Mouth Daily     aspirin EC 81 MG tablet Take 1 tablet (81 mg total) by mouth daily. 30 tablet    atorvastatin (LIPITOR) 40 MG tablet Take 1 tablet (40 mg total) by mouth daily. 30 tablet 2   isosorbide mononitrate (IMDUR) 120 MG 24 hr tablet Take 120 mg by mouth daily.     lisinopril (ZESTRIL) 40 MG tablet Hold until followup with outpatient provider due to acute kidney injury.     metoprolol succinate (TOPROL-XL) 100 MG 24 hr tablet Take 100 mg by mouth daily.     pantoprazole (PROTONIX) 40 MG tablet Take 1 tablet (40 mg total) by mouth 2 (two) times daily. 60 tablet 0   PREMARIN vaginal cream USE 1 GRAM INTRAVAGINALLY TWICE PER WEEK 30 g 3   rivaroxaban (XARELTO) 2.5 MG TABS tablet Take 1 tablet (2.5 mg total) by mouth 2 (two) times daily. 60 tablet 0   traZODone (DESYREL) 50 MG tablet Take 50 mg by mouth at bedtime.     acetaminophen-codeine (TYLENOL #3) 300-30 MG tablet Take 1 tablet by mouth every 12 (twelve) hours as needed for moderate pain or severe pain. (Patient not taking: Reported on 03/16/2022) 30 tablet 0   furosemide (LASIX) 20 MG tablet Take 20 mg by mouth daily. (Patient not taking: Reported on 03/16/2022)     ondansetron (ZOFRAN) 4 MG tablet Take 1 tablet (4 mg total) by mouth every 8 (eight) hours as needed for nausea or vomiting. (Patient not taking: Reported on 03/16/2022) 60 tablet 1   zanubrutinib (BRUKINSA) 80 MG capsule Take 2 capsules  (160 mg total) by mouth 2 (two) times daily. (Patient not taking: Reported on 03/16/2022) 120 capsule 1   No current facility-administered medications for this visit.     PHYSICAL EXAMINATION: ECOG PERFORMANCE STATUS: 1 - Symptomatic but completely ambulatory Vitals:   03/16/22 1116  BP: (!) 156/78  Pulse: (!) 43  Resp: 18  Temp: (!) 96.4 F (35.8 C)   Filed Weights   03/16/22 1116  Weight: 145 lb 11.2 oz (66.1 kg)    Physical Exam Constitutional:      General: She is not in acute distress.    Appearance: She is not diaphoretic.  HENT:     Head: Normocephalic and atraumatic.     Nose: Nose normal.     Mouth/Throat:     Pharynx: No oropharyngeal exudate.  Eyes:     General: No scleral icterus.       Left eye: No discharge.     Conjunctiva/sclera: Conjunctivae normal.     Pupils: Pupils are equal, round, and reactive to light.  Neck:     Vascular: No JVD.  Cardiovascular:     Rate and Rhythm: Normal rate and regular rhythm.     Heart sounds: No murmur heard. Pulmonary:     Effort: Pulmonary effort is normal. No respiratory distress.     Breath sounds: No rales.  Chest:     Chest wall: No tenderness.  Abdominal:     General: Bowel sounds are normal. There is no distension.     Palpations: Abdomen is soft.     Tenderness: There is no abdominal tenderness.  Musculoskeletal:        General: Normal range of  motion.     Cervical back: Normal range of motion and neck supple.  Lymphadenopathy:     Cervical: No cervical adenopathy.  Skin:    General: Skin is warm and dry.     Findings: No erythema.  Neurological:     Mental Status: She is alert and oriented to person, place, and time.     Cranial Nerves: No cranial nerve deficit.     Motor: No abnormal muscle tone.     Coordination: Coordination normal.  Psychiatric:        Mood and Affect: Affect normal.        Judgment: Judgment normal.      LABORATORY DATA:  I have reviewed the data as listed Lab Results   Component Value Date   WBC 40.2 (H) 03/16/2022   HGB 11.6 (L) 03/16/2022   HCT 36.7 03/16/2022   MCV 93.9 03/16/2022   PLT 187 03/16/2022

## 2022-03-17 NOTE — Assessment & Plan Note (Addendum)
I discussed with [facility] team. Patient will see GI for further evaluation and management of her dysphagia due to esophagitis.  Plan repeat EGD while holding her anticoagulation. Continue PPI

## 2022-03-17 NOTE — Assessment & Plan Note (Addendum)
Did not tolerate acalabrutinib due to possible allergic reaction " throat closing up". Retrospectively maybe due to esophagitis symptoms.' Not able to swallow Zanubrutinib, currently off. She is not interested in other intravenous treatment options.  Plan to restart after her esophageal symptoms improves.   Pelvis mass, . Recent images indicate ovarian origin. Check CA 125 and HE4. Refer to [facility]

## 2022-03-18 LAB — CA 125: Cancer Antigen (CA) 125: 14.9 U/mL (ref 0.0–38.1)

## 2022-03-21 ENCOUNTER — Other Ambulatory Visit (HOSPITAL_COMMUNITY): Payer: Self-pay

## 2022-03-21 DIAGNOSIS — K219 Gastro-esophageal reflux disease without esophagitis: Secondary | ICD-10-CM | POA: Diagnosis not present

## 2022-03-21 DIAGNOSIS — M8589 Other specified disorders of bone density and structure, multiple sites: Secondary | ICD-10-CM | POA: Diagnosis not present

## 2022-03-21 DIAGNOSIS — Z1389 Encounter for screening for other disorder: Secondary | ICD-10-CM | POA: Diagnosis not present

## 2022-03-21 DIAGNOSIS — I2510 Atherosclerotic heart disease of native coronary artery without angina pectoris: Secondary | ICD-10-CM | POA: Diagnosis not present

## 2022-03-21 DIAGNOSIS — G4733 Obstructive sleep apnea (adult) (pediatric): Secondary | ICD-10-CM | POA: Diagnosis not present

## 2022-03-21 DIAGNOSIS — I509 Heart failure, unspecified: Secondary | ICD-10-CM | POA: Diagnosis not present

## 2022-03-21 DIAGNOSIS — Z0001 Encounter for general adult medical examination with abnormal findings: Secondary | ICD-10-CM | POA: Diagnosis not present

## 2022-03-21 DIAGNOSIS — C9111 Chronic lymphocytic leukemia of B-cell type in remission: Secondary | ICD-10-CM | POA: Diagnosis not present

## 2022-03-21 DIAGNOSIS — E782 Mixed hyperlipidemia: Secondary | ICD-10-CM | POA: Diagnosis not present

## 2022-03-21 DIAGNOSIS — I10 Essential (primary) hypertension: Secondary | ICD-10-CM | POA: Diagnosis not present

## 2022-03-22 DIAGNOSIS — T364X5A Adverse effect of tetracyclines, initial encounter: Secondary | ICD-10-CM | POA: Diagnosis not present

## 2022-03-22 DIAGNOSIS — K208 Other esophagitis without bleeding: Secondary | ICD-10-CM | POA: Diagnosis not present

## 2022-03-22 DIAGNOSIS — Z7901 Long term (current) use of anticoagulants: Secondary | ICD-10-CM | POA: Diagnosis not present

## 2022-03-22 DIAGNOSIS — R1319 Other dysphagia: Secondary | ICD-10-CM | POA: Diagnosis not present

## 2022-03-22 DIAGNOSIS — K219 Gastro-esophageal reflux disease without esophagitis: Secondary | ICD-10-CM | POA: Diagnosis not present

## 2022-03-22 DIAGNOSIS — Z7902 Long term (current) use of antithrombotics/antiplatelets: Secondary | ICD-10-CM | POA: Diagnosis not present

## 2022-03-28 ENCOUNTER — Telehealth: Payer: Self-pay

## 2022-03-28 DIAGNOSIS — R1900 Intra-abdominal and pelvic swelling, mass and lump, unspecified site: Secondary | ICD-10-CM

## 2022-03-28 NOTE — Telephone Encounter (Signed)
-----   Message from Earlie Server, MD sent at 03/26/2022  8:31 PM EDT ----- She is going to have EGD  Pelvic mass appears to originate from ovary. Please refer her to gynonc  Follow up with me in 1 month, MD only thanks.   zy   ----- Message ----- From: Geanie Kenning, PA-C Sent: 03/22/2022   1:39 PM EDT To: Earlie Server, MD  Saw patient today and we arranging repeat EGD at [facility] so we can take biopsies. Sounds like she developed esophagitis after Brukinsa. We are getting clearance to hold her Plavix + Xarelto combination. Just keeping you in the loop. Thanks!  [employer]

## 2022-03-28 NOTE — Telephone Encounter (Signed)
Referral for Gyn onc has been placed in Epic.   Please schedule pt to see Gyn onc (next avai)  MD follow in 1 month.   Please inform pt of appts.

## 2022-03-30 ENCOUNTER — Other Ambulatory Visit (HOSPITAL_COMMUNITY): Payer: Self-pay

## 2022-04-05 ENCOUNTER — Ambulatory Visit: Payer: Medicare Other

## 2022-04-05 ENCOUNTER — Inpatient Hospital Stay: Payer: Medicare Other

## 2022-04-12 ENCOUNTER — Other Ambulatory Visit (HOSPITAL_COMMUNITY): Payer: Self-pay

## 2022-04-14 ENCOUNTER — Encounter: Payer: Self-pay | Admitting: Gastroenterology

## 2022-04-14 ENCOUNTER — Ambulatory Visit
Admission: RE | Admit: 2022-04-14 | Discharge: 2022-04-14 | Disposition: A | Discharge status: Home / Self Care | Payer: Medicare Other | Source: Ambulatory Visit | Attending: Internal Medicine | Admitting: Internal Medicine

## 2022-04-14 DIAGNOSIS — Z1231 Encounter for screening mammogram for malignant neoplasm of breast: Secondary | ICD-10-CM | POA: Diagnosis not present

## 2022-04-17 ENCOUNTER — Encounter: Admission: RE | Payer: Self-pay | Source: Ambulatory Visit

## 2022-04-17 ENCOUNTER — Ambulatory Visit: Admission: RE | Admit: 2022-04-17 | Payer: Medicare Other | Source: Ambulatory Visit | Admitting: Gastroenterology

## 2022-04-17 SURGERY — ESOPHAGOGASTRODUODENOSCOPY (EGD) WITH PROPOFOL
Anesthesia: General

## 2022-04-18 ENCOUNTER — Other Ambulatory Visit (HOSPITAL_COMMUNITY): Payer: Self-pay

## 2022-04-19 ENCOUNTER — Inpatient Hospital Stay: Payer: Medicare Other

## 2022-04-19 ENCOUNTER — Other Ambulatory Visit (HOSPITAL_COMMUNITY): Payer: Self-pay

## 2022-04-25 ENCOUNTER — Other Ambulatory Visit (HOSPITAL_COMMUNITY): Payer: Self-pay

## 2022-04-27 ENCOUNTER — Other Ambulatory Visit (HOSPITAL_COMMUNITY): Payer: Self-pay

## 2022-04-28 ENCOUNTER — Ambulatory Visit: Payer: Medicare Other

## 2022-04-28 ENCOUNTER — Inpatient Hospital Stay: Payer: Medicare Other | Attending: Oncology | Admitting: Oncology

## 2022-04-28 ENCOUNTER — Encounter: Payer: Self-pay | Admitting: Oncology

## 2022-04-28 VITALS — BP 149/72 | HR 44 | Temp 96.0°F | Resp 16 | Wt 145.2 lb

## 2022-04-28 DIAGNOSIS — Z8673 Personal history of transient ischemic attack (TIA), and cerebral infarction without residual deficits: Secondary | ICD-10-CM | POA: Insufficient documentation

## 2022-04-28 DIAGNOSIS — I2510 Atherosclerotic heart disease of native coronary artery without angina pectoris: Secondary | ICD-10-CM | POA: Diagnosis not present

## 2022-04-28 DIAGNOSIS — K209 Esophagitis, unspecified without bleeding: Secondary | ICD-10-CM

## 2022-04-28 DIAGNOSIS — R1900 Intra-abdominal and pelvic swelling, mass and lump, unspecified site: Secondary | ICD-10-CM | POA: Diagnosis not present

## 2022-04-28 DIAGNOSIS — C9110 Chronic lymphocytic leukemia of B-cell type not having achieved remission: Secondary | ICD-10-CM | POA: Diagnosis not present

## 2022-04-28 DIAGNOSIS — Z79899 Other long term (current) drug therapy: Secondary | ICD-10-CM | POA: Diagnosis not present

## 2022-04-28 DIAGNOSIS — I10 Essential (primary) hypertension: Secondary | ICD-10-CM | POA: Insufficient documentation

## 2022-04-28 DIAGNOSIS — Z7982 Long term (current) use of aspirin: Secondary | ICD-10-CM | POA: Insufficient documentation

## 2022-04-28 DIAGNOSIS — F17210 Nicotine dependence, cigarettes, uncomplicated: Secondary | ICD-10-CM | POA: Diagnosis not present

## 2022-04-28 DIAGNOSIS — K2100 Gastro-esophageal reflux disease with esophagitis, without bleeding: Secondary | ICD-10-CM | POA: Diagnosis not present

## 2022-04-28 DIAGNOSIS — F32A Depression, unspecified: Secondary | ICD-10-CM | POA: Insufficient documentation

## 2022-04-28 DIAGNOSIS — E7800 Pure hypercholesterolemia, unspecified: Secondary | ICD-10-CM | POA: Insufficient documentation

## 2022-04-28 DIAGNOSIS — Z90710 Acquired absence of both cervix and uterus: Secondary | ICD-10-CM | POA: Insufficient documentation

## 2022-04-28 DIAGNOSIS — E039 Hypothyroidism, unspecified: Secondary | ICD-10-CM | POA: Diagnosis not present

## 2022-04-28 DIAGNOSIS — I480 Paroxysmal atrial fibrillation: Secondary | ICD-10-CM | POA: Diagnosis not present

## 2022-04-28 DIAGNOSIS — Z7901 Long term (current) use of anticoagulants: Secondary | ICD-10-CM | POA: Insufficient documentation

## 2022-04-28 DIAGNOSIS — Z9079 Acquired absence of other genital organ(s): Secondary | ICD-10-CM | POA: Insufficient documentation

## 2022-04-28 DIAGNOSIS — I252 Old myocardial infarction: Secondary | ICD-10-CM | POA: Diagnosis not present

## 2022-04-28 NOTE — Progress Notes (Signed)
Patient here for oncology follow-up appointment, concerns of nausea

## 2022-04-28 NOTE — Progress Notes (Signed)
Hematology/Oncology Progress note [phone] [phone]         Patient Care Team: Perrin Maltese, MD as PCP - General (Internal Medicine) Earlie Server, MD as Consulting Physician (Hematology and Oncology) Clent Jacks, RN as Oncology Nurse Navigator  ASSESSMENT & PLAN:   CLL (chronic lymphocytic leukemia) [facility]) Did not tolerate acalabrutinib due to possible allergic reaction " throat closing up". Retrospectively maybe due to esophagitis symptoms.' Previously not able to swallow Zanubrutinib due to esophagitis.  Watchful waiting.    Esophagitis Continue PPI   Pelvic mass Recent images indicate ovarian origin. Normal CA 125 and elevated HE4. Refer to [place] This Encounter  Procedures   CBC with Differential/Platelet    Standing Status:   Future    Standing Expiration Date:   04/29/2023   Comprehensive metabolic panel    Standing Status:   Future    Standing Expiration Date:   04/28/2023   Lactate dehydrogenase    Standing Status:   Future    Standing Expiration Date:   04/29/2023    Follow-up  2 months. . All questions were answered. The patient knows to call the clinic with any problems, questions or concerns.  Earlie Server, MD, PhD [facility] Hematology Oncology 04/28/2022     REASON FOR VISIT Follow up for treatment of CLL  HISTORY OF PRESENTING ILLNESS:  Karen [name] is a  76 y.o.  female with PMH listed below who presents for follow-up of CLL treatments. Oncology history summary listed as below. Oncology History  CLL (chronic lymphocytic leukemia) ([place])  09/24/2015 Imaging   10/14/2015 chest without contrast Images were independently reviewed by me Small pulmonary nodules are stable over the past year.  Considered benign.  Several lymph nodes are borderline enlarged, including the lower right paratracheal lymph node and a portacaval lymph node. 08/27/2017 US abdomen complete, no hepato-splenomegaly.   12/18/2017 Initial  Diagnosis   CLL (chronic lymphocytic leukemia)   07/30/2017, peripheral blood flow cytometry showed CD5+, CD23+, CD38 negative monoclonal B-cell population consistent with a diagnosis of CLL   06/27/2019 Cancer Staging   Staging form: Chronic Lymphocytic Leukemia / Small Lymphocytic Lymphoma, AJCC 8th Edition - Clinical: Modified Rai Stage I (Modified Rai risk: Intermediate, Lymphocytosis: Present, Adenopathy: Present, Organomegaly: Absent, Anemia: Absent, Thrombocytopenia: Absent) - Signed by Earlie Server, MD on 12/06/2021   11/27/2021 Imaging   Outside facility CT Aorta and run off w wo  showed moderate atherosclerotic aortic disease.  Diffuse lightest of moderate disease of the bilateral [place] vasculature.Diffuse mild stenotic disease of the bilateral SFAs. In addition, patient has soft tissue mass/lymph node along the left pelvic sidewall measures 4.9 x 4.7 cm, numerous peritoneum subcu tissue nodule, a deep right pelvic nodule and numerous enlarged mesenteric and retroperitoneal lymph nodes are identified.  For example a right [place] mesenteric lymph node measures 2.4 x 2.2 cm.  The left periaortic lymph node measures 4.4 x 2.0 cm.  A pericardial lymph node measures 2.6 x 1.3 cm.   01/03/2022 Pathology Results   Right abdomen lymph node biopsy showed chronic lymphocytic leukemia/small lymphocytic lymphoma    01/09/2022 - 02/06/2022 Chemotherapy   Acalabrutinib 100 mg twice daily  Stopped due to sensation of throat closing.   03/09/2022 - 03/12/2022 Hospital Admission   Patient reports that she took 1 day of zanubrutinib around 03/06/2022.  She has had difficulty swallowing the pills. She went to emergency room on 03/09/2022 for evaluation of dysphagia symptoms.  Symptoms occurs with both  liquids and solids.  Also midsternal chest pain.  She was found to have hypertension emergency with systolic blood pressure greater than 200.  Patient states that she is being compliant with her blood pressure  pills. Patient was treated with hydralazine, Cardizem drip.  Blood pressure improves. During the admission, she has had a EGD done by Dr. Allen Norris which showed esophagitis.  No biopsy was taken due to patient being on anticoagulation (aspirin, Plavix, low-dose Xarelto 2.5 mg twice daily] plan is for patient to follow-up with Dr. Alice Reichert outpatient for repeat EGD while off anticoagulation.  Her PPI dose was increased. She was discharged on metoprolol, lisinopril, amlodipine and nitrates     INTERVAL HISTORY Karen [name] is a 76 y.o. female who has above history reviewed by me today presents for follow up visit for CLL 03/09/22 EGD showed Non-severe non-erosive esophagitis with no bleeding She take PPI. She feels well today. Swallowing is better.    Review of Systems  Constitutional:  Positive for fatigue. Negative for appetite change, chills, fever and unexpected weight change.  HENT:   Negative for hearing loss and voice change.   Eyes:  Negative for eye problems.  Respiratory:  Negative for chest tightness and cough.   Cardiovascular:  Negative for chest pain.  Gastrointestinal:  Negative for abdominal distention, abdominal pain, blood in stool and nausea.  Endocrine: Negative for hot flashes.  Genitourinary:  Negative for difficulty urinating and frequency.   Musculoskeletal:  Positive for back pain. Negative for arthralgias.  Skin:  Negative for itching and rash.  Neurological:  Negative for extremity weakness and headaches.  Hematological:  Negative for adenopathy.  Psychiatric/Behavioral:  Negative for confusion.      MEDICAL HISTORY:  Past Medical History:  Diagnosis Date   Anxiety    Artery occlusion    Asthma    Back pain    Bronchitis    Cancer (HCC)    Skin Cancer   CLL (chronic lymphocytic leukemia) (HCC)    Coronary artery disease    Depression    Dyspnea    GERD (gastroesophageal reflux disease)    History of kidney stones    Hypercholesteremia    Hyperlipemia     Hypertension    Hypothyroidism    Myocardial infarction ([place]) 1999   Neuropathy    Restless leg syndrome    Sleep apnea    use C-PAP   Wears dentures    full upper, partial lower.  doesn't currently wear    SURGICAL HISTORY: Past Surgical History:  Procedure Laterality Date   COLONOSCOPY WITH PROPOFOL N/A 11/20/2016   Procedure: COLONOSCOPY WITH PROPOFOL;  Surgeon: Lucilla Lame, MD;  Location: [place];  Service: Endoscopy;  Laterality: N/A;  sleep apnea   CORONARY ANGIOPLASTY WITH STENT PLACEMENT  2008   DILATION AND CURETTAGE OF UTERUS     ESOPHAGOGASTRODUODENOSCOPY (EGD) WITH PROPOFOL N/A 03/09/2022   Procedure: ESOPHAGOGASTRODUODENOSCOPY (EGD) WITH PROPOFOL;  Surgeon: Lucilla Lame, MD;  Location: [facility];  Service: Endoscopy;  Laterality: N/A;   EYE SURGERY Bilateral    Cataract Extraction with IOL   FRACTURE SURGERY     HAND SURGERY Left 12/2017   upper hand fracture repair   LEFT HEART CATH AND CORONARY ANGIOGRAPHY N/A 06/29/2017   Procedure: LEFT HEART CATH AND CORONARY ANGIOGRAPHY;  Surgeon: Dionisio David, MD;  Location: [place] [facility];  Service: Cardiovascular;  Laterality: N/A;   LEFT HEART CATH AND CORONARY ANGIOGRAPHY Left 12/02/2018   Procedure: LEFT HEART CATH  AND CORONARY ANGIOGRAPHY;  Surgeon: Dionisio David, MD;  Location: [place] [facility];  Service: Cardiovascular;  Laterality: Left;   OPEN REDUCTION INTERNAL FIXATION (ORIF) DISTAL RADIAL FRACTURE Right 08/12/2018   Procedure: OPEN REDUCTION INTERNAL FIXATION (ORIF) DISTAL RADIAL FRACTURE;  Surgeon: [name], MD;  Location: [facility];  Service: Orthopedics;  Laterality: Right;   POLYPECTOMY  11/20/2016   Procedure: POLYPECTOMY;  Surgeon: Lucilla Lame, MD;  Location: [place];  Service: Endoscopy;;   TUBAL LIGATION     VAGINAL HYSTERECTOMY     VULVECTOMY Right 01/31/2016   Procedure: WIDE EXCISION VULVECTOMY-RIGHT LABIA;  Surgeon: Brayton Mars, MD;  Location:  [facility];  Service: Gynecology;  Laterality: Right;    SOCIAL HISTORY: Social History   Socioeconomic History   Marital status: Single    Spouse name: Not on file   Number of children: 5   Years of education: Not on file   Highest education level: Not on file  Occupational History   Occupation: part time job    Comment: [employer]  Tobacco Use   Smoking status: Every Day    Packs/day: 1.00    Years: 35.00    Total pack years: 35.00    Types: Cigarettes   Smokeless tobacco: Current    Types: Snuff  Vaping Use   Vaping Use: Never used  Substance and Sexual Activity   Alcohol use: No    Alcohol/week: 0.0 standard drinks of alcohol   Drug use: No   Sexual activity: Not Currently    Partners: Male    Birth control/protection: Surgical  Other Topics Concern   Not on file  Social History Narrative   Not on file   Social Determinants of Health   Financial Resource Strain: Low Risk  (12/02/2018)   Overall Financial Resource Strain (CARDIA)    Difficulty of Paying Living Expenses: Not very hard  Food Insecurity: No Food Insecurity (12/02/2018)   Hunger Vital Sign    Worried About Running Out of Food in the Last Year: Never true    Ran Out of Food in the Last Year: Never true  Transportation Needs: No Transportation Needs (12/02/2018)   PRAPARE - Hydrologist (Medical): No    Lack of Transportation (Non-Medical): No  Physical Activity: Inactive (12/18/2017)   Exercise Vital Sign    Days of Exercise per Week: 0 days    Minutes of Exercise per Session: 0 min  Stress: No Stress Concern Present (12/02/2018)   [place]    Feeling of Stress : Only a little  Social Connections: Unknown (12/02/2018)   Social Connection and Isolation Panel [NHANES]    Frequency of Communication with Friends and Family: More than three times a week    Frequency of Social Gatherings with Friends and  Family: Not on file    Attends Religious Services: Not on file    Active Member of Clubs or Organizations: Not on file    Attends Archivist Meetings: Not on file    Marital Status: Not on file  Intimate Partner Violence: Not At Risk (12/02/2018)   Humiliation, Afraid, Rape, and Kick questionnaire    Fear of Current or Ex-Partner: No    Emotionally Abused: No    Physically Abused: No    Sexually Abused: No    FAMILY HISTORY: Family History  Problem Relation Age of Onset   Heart disease Father    Stroke  Mother    Hypertension Mother    Lung cancer Brother    Heart attack Brother    Congestive Heart Failure Sister    Cervical cancer Sister    Cancer Neg Hx    Diabetes Neg Hx    Breast cancer Neg Hx     ALLERGIES:  is allergic to requip [ropinirole hcl], celebrex [celecoxib], meloxicam, and nitrofurantoin.  MEDICATIONS:  Current Outpatient Medications  Medication Sig Dispense Refill   acetaminophen (TYLENOL) 500 MG tablet Take 500-1,000 mg by mouth every 6 (six) hours as needed for moderate pain or fever.     albuterol (VENTOLIN HFA) 108 (90 Base) MCG/ACT inhaler Inhale 1-2 puffs into the lungs every 6 (six) hours as needed for wheezing or shortness of breath.     amLODipine (NORVASC) 5 MG tablet Take 5 mg by mouth daily. 1 Tablet(s) By Mouth Daily     aspirin EC 81 MG tablet Take 1 tablet (81 mg total) by mouth daily. 30 tablet    atorvastatin (LIPITOR) 40 MG tablet Take 1 tablet (40 mg total) by mouth daily. 30 tablet 2   buPROPion (WELLBUTRIN XL) 300 MG 24 hr tablet Take 300 mg by mouth daily.     isosorbide mononitrate (IMDUR) 120 MG 24 hr tablet Take 120 mg by mouth daily.     lisinopril (ZESTRIL) 20 MG tablet Take 20 mg by mouth daily.     lisinopril (ZESTRIL) 40 MG tablet Hold until followup with outpatient provider due to acute kidney injury.     metoprolol succinate (TOPROL-XL) 100 MG 24 hr tablet Take 100 mg by mouth daily.     nitroGLYCERIN (NITROSTAT) 0.4  MG SL tablet Place under the tongue.     ondansetron (ZOFRAN) 4 MG tablet Take 1 tablet (4 mg total) by mouth every 8 (eight) hours as needed for nausea or vomiting. 60 tablet 1   pantoprazole (PROTONIX) 40 MG tablet Take 1 tablet (40 mg total) by mouth 2 (two) times daily. 60 tablet 0   PREMARIN vaginal cream USE 1 GRAM INTRAVAGINALLY TWICE PER WEEK 30 g 3   rivaroxaban (XARELTO) 2.5 MG TABS tablet Take 1 tablet (2.5 mg total) by mouth 2 (two) times daily. 60 tablet 0   traZODone (DESYREL) 50 MG tablet Take 50 mg by mouth at bedtime.     zanubrutinib (BRUKINSA) 80 MG capsule Take 2 capsules (160 mg total) by mouth 2 (two) times daily. 120 capsule 1   acetaminophen-codeine (TYLENOL #3) 300-30 MG tablet Take 1 tablet by mouth every 12 (twelve) hours as needed for moderate pain or severe pain. (Patient not taking: Reported on 03/16/2022) 30 tablet 0   furosemide (LASIX) 20 MG tablet Take 20 mg by mouth daily. (Patient not taking: Reported on 03/16/2022)     No current facility-administered medications for this visit.     PHYSICAL EXAMINATION: ECOG PERFORMANCE STATUS: 1 - Symptomatic but completely ambulatory Vitals:   04/28/22 1213  BP: (!) 149/72  Pulse: (!) 44  Resp: 16  Temp: (!) 96 F (35.6 C)  SpO2: 99%   Filed Weights   04/28/22 1213  Weight: 145 lb 3.2 oz (65.9 kg)    Physical Exam Constitutional:      General: She is not in acute distress.    Appearance: She is not diaphoretic.  HENT:     Head: Normocephalic and atraumatic.     Nose: Nose normal.     Mouth/Throat:     Pharynx: No oropharyngeal exudate.  Eyes:  General: No scleral icterus.       Left eye: No discharge.     Conjunctiva/sclera: Conjunctivae normal.     Pupils: Pupils are equal, round, and reactive to light.  Neck:     Vascular: No JVD.  Cardiovascular:     Rate and Rhythm: Normal rate and regular rhythm.     Heart sounds: No murmur heard. Pulmonary:     Effort: Pulmonary effort is normal. No  respiratory distress.     Breath sounds: No rales.  Chest:     Chest wall: No tenderness.  Abdominal:     General: Bowel sounds are normal. There is no distension.     Palpations: Abdomen is soft.     Tenderness: There is no abdominal tenderness.  Musculoskeletal:        General: Normal range of motion.     Cervical back: Normal range of motion and neck supple.  Lymphadenopathy:     Cervical: No cervical adenopathy.  Skin:    General: Skin is warm and dry.     Findings: No erythema.  Neurological:     Mental Status: She is alert and oriented to person, place, and time.     Cranial Nerves: No cranial nerve deficit.     Motor: No abnormal muscle tone.     Coordination: Coordination normal.  Psychiatric:        Mood and Affect: Affect normal.        Judgment: Judgment normal.      LABORATORY DATA:  I have reviewed the data as listed Lab Results  Component Value Date   WBC 40.2 (H) 03/16/2022   HGB 11.6 (L) 03/16/2022   HCT 36.7 03/16/2022   MCV 93.9 03/16/2022   PLT 187 03/16/2022

## 2022-04-29 NOTE — Assessment & Plan Note (Signed)
Did not tolerate acalabrutinib due to possible allergic reaction " throat closing up". Retrospectively maybe due to esophagitis symptoms.' Previously not able to swallow Zanubrutinib due to esophagitis.  Watchful waiting.

## 2022-04-29 NOTE — Assessment & Plan Note (Signed)
Continue PPI ?

## 2022-04-29 NOTE — Assessment & Plan Note (Signed)
Recent images indicate ovarian origin. Normal CA 125 and elevated HE4. Refer to [facility]

## 2022-05-03 ENCOUNTER — Telehealth: Payer: Self-pay

## 2022-05-03 ENCOUNTER — Inpatient Hospital Stay: Payer: Medicare Other

## 2022-05-03 NOTE — Progress Notes (Deleted)
Gynecologic Oncology Consult Visit  Referring Provider: Dr. Earlie Server  Chief Concern: Pelvic mass  Subjective:  Karen [name] is a 76 y.o. female s/p Vaginal Hysterectomy (ovaries ***)  CLL (chronic lymphocytic leukemia) who is seen in consultation from Dr. Tasia Catchings for solid left ovarian mass.  Her history is as follows:   07/30/2017, peripheral blood flow cytometry showed CD5+, CD23+, CD38 negative monoclonal B-cell population consistent with a diagnosis of CLL  11/27/2021 CT A/P: soft tissue mass/lymph node along the left pelvic sidewall measures 4.9 x 4.7 cm, numerous peritoneum subcu tissue nodule, a deep right pelvic nodule and numerous enlarged mesenteric and retroperitoneal lymph nodes are identified.  For example a right [place] mesenteric lymph node measures 2.4 x 2.2 cm.  The left periaortic lymph node measures 4.4 x 2.0 cm.  A pericardial lymph node measures 2.6 x 1.3 cm.  01/03/2022 Pathology Results    Right abdomen lymph node biopsy showed chronic lymphocytic leukemia/small lymphocytic lymphoma     Treated with acalabrutinib 100 mg twice daily  03/09/2022: CT C/A/P IMPRESSION: 1. No evidence for thoracic or abdominal aortic aneurysm or dissection. 2. Lymphadenopathy is identified in the chest, abdomen, and pelvis in this patient with reported history of CLL. Mediastinal lymphadenopathy is similar to prior chest CT of 2017. A hepatoduodenal ligament lymph node included on that prior chest CT has increased in size in the interval. Additional abdominopelvic lymphadenopathy was not included on available prior imaging. Close follow-up recommended and comparison to old studies suggested to assess for progression. 3. Left pelvic soft tissue mass measures 5.5 x 4.4 cm. Left gonadal veins appear to arise directly from this lesion suggesting ovarian etiology. Pelvic ultrasound may prove helpful to further evaluate. 4. Bilateral renal lesions with attenuation too high to be  simple cysts. These may be cysts complicated by proteinaceous debris or hemorrhage, but MRI abdomen with and without contrast recommended to confirm. 5. Diffuse colonic diverticulosis without diverticulitis. 6. Stable appearance of small right-sided pulmonary nodules, consistent with benign etiology. 7. Aortic Atherosclerosis (ICD10-I70.0).  03/09/2022 Pelvic Ultrasound FINDINGS: Uterus: Surgically absent. Right ovary:  2.6 x 1.8 x 1.7 cm = volume: 4.3 mL. Normal appearance/no adnexal mass.   Left ovary: 5.1 x 3.4 x 4.3 cm = volume: 36 mL. Enlarged left ovary with homogeneous solid soft tissue echogenicity.    IMPRESSION: 1. 5.1 cm solid soft tissue mass in the left adnexa likely representing left ovarian origin. Given that this patient has a reported history of CLL, there is likely prior imaging available outside are system. Consider comparison to previous outside studies to assess chronicity of this finding. 2. No evidence for ovarian torsion. 3. Status post hysterectomy.  CA125 14.9  HE4 125 (elevated)  Problem List: Patient Active Problem List   Diagnosis Date Noted   Esophagitis 03/17/2022   Hypertensive urgency 03/09/2022   Odynophagia 03/09/2022   Coronary artery disease    Depression    History of CVA (cerebrovascular accident)    Paroxysmal atrial fibrillation ([place])    Esophageal dysphagia    Encounter for antineoplastic chemotherapy 01/26/2022   Chemotherapy-induced nausea 01/26/2022   Headache 01/26/2022   Goals of care, counseling/discussion 12/06/2021   Tobacco abuse 09/14/2020   Weakness    AKI (acute kidney injury) ([place])    Hyponatremia 08/07/2020   Dysgeusia 08/07/2020   CLL (chronic lymphocytic leukemia) ([place]) 12/18/2017   Unstable angina ([place]) 06/22/2017   Inclusion cyst of vulva 12/14/2016   Lumbar spondylosis 08/14/2016   Trochanteric bursitis of right  hip 08/14/2016   Herpes simplex virus (HSV) infection 12/09/2015   Pelvic mass 12/09/2015    History of total vaginal hysterectomy (TVH) 11/25/2015   Vaginal odor 11/25/2015   History of UTI 11/25/2015   Vaginal atrophy 11/25/2015   Dyspareunia in female 11/25/2015   Tendinitis of wrist 05/26/2015    Past Medical History: Past Medical History:  Diagnosis Date   Anxiety    Artery occlusion    Asthma    Back pain    Bronchitis    Cancer (HCC)    Skin Cancer   CLL (chronic lymphocytic leukemia) (HCC)    Coronary artery disease    Depression    Dyspnea    GERD (gastroesophageal reflux disease)    History of kidney stones    Hypercholesteremia    Hyperlipemia    Hypertension    Hypothyroidism    Myocardial infarction ([place]) 1999   Neuropathy    Restless leg syndrome    Sleep apnea    use C-PAP   Wears dentures    full upper, partial lower.  doesn't currently wear    Past Surgical History: Past Surgical History:  Procedure Laterality Date   COLONOSCOPY WITH PROPOFOL N/A 11/20/2016   Procedure: COLONOSCOPY WITH PROPOFOL;  Surgeon: Lucilla Lame, MD;  Location: [facility];  Service: Endoscopy;  Laterality: N/A;  sleep apnea   CORONARY ANGIOPLASTY WITH STENT PLACEMENT  2008   DILATION AND CURETTAGE OF UTERUS     ESOPHAGOGASTRODUODENOSCOPY (EGD) WITH PROPOFOL N/A 03/09/2022   Procedure: ESOPHAGOGASTRODUODENOSCOPY (EGD) WITH PROPOFOL;  Surgeon: Lucilla Lame, MD;  Location: [facility];  Service: Endoscopy;  Laterality: N/A;   EYE SURGERY Bilateral    Cataract Extraction with IOL   FRACTURE SURGERY     HAND SURGERY Left 12/2017   upper hand fracture repair   LEFT HEART CATH AND CORONARY ANGIOGRAPHY N/A 06/29/2017   Procedure: LEFT HEART CATH AND CORONARY ANGIOGRAPHY;  Surgeon: Dionisio David, MD;  Location: [facility];  Service: Cardiovascular;  Laterality: N/A;   LEFT HEART CATH AND CORONARY ANGIOGRAPHY Left 12/02/2018   Procedure: LEFT HEART CATH AND CORONARY ANGIOGRAPHY;  Surgeon: Dionisio David, MD;  Location: [facility];  Service:  Cardiovascular;  Laterality: Left;   OPEN REDUCTION INTERNAL FIXATION (ORIF) DISTAL RADIAL FRACTURE Right 08/12/2018   Procedure: OPEN REDUCTION INTERNAL FIXATION (ORIF) DISTAL RADIAL FRACTURE;  Surgeon: [name], MD;  Location: [facility];  Service: Orthopedics;  Laterality: Right;   POLYPECTOMY  11/20/2016   Procedure: POLYPECTOMY;  Surgeon: Lucilla Lame, MD;  Location: [facility];  Service: Endoscopy;;   TUBAL LIGATION     VAGINAL HYSTERECTOMY     VULVECTOMY Right 01/31/2016   Procedure: WIDE EXCISION VULVECTOMY-RIGHT LABIA;  Surgeon: Brayton Mars, MD;  Location: [facility];  Service: Gynecology;  Laterality: Right;    Past Gynecologic History:  As per HPI  OB History:  OB History  Gravida Para Term Preterm AB Living  8 5 5   3 5   SAB IAB Ectopic Multiple Live Births  3       5    # Outcome Date GA Lbr Len/2nd Weight Sex Delivery Anes PTL Lv  8 Term 41    M Vag-Spont   LIV  7 Term 15    F Vag-Spont   LIV  6 Term 16    F Vag-Spont   LIV  [facility]  4 Term [phone]  M Vag-Spont   LIV  3 SAB           2 SAB           1 SAB             Family History: Family History  Problem Relation Age of Onset   Heart disease Father    Stroke Mother    Hypertension Mother    Lung cancer Brother    Heart attack Brother    Congestive Heart Failure Sister    Cervical cancer Sister    Cancer Neg Hx    Diabetes Neg Hx    Breast cancer Neg Hx     Social History: Social History   Socioeconomic History   Marital status: Single    Spouse name: Not on file   Number of children: 5   Years of education: Not on file   Highest education level: Not on file  Occupational History   Occupation: part time job    Comment: Engineer, building services  Tobacco Use   Smoking status: Every Day    Packs/day: 1.00    Years: 35.00    Total pack years: 35.00    Types: Cigarettes   Smokeless tobacco: Current    Types: Snuff  Vaping Use   Vaping Use: Never used   Substance and Sexual Activity   Alcohol use: No    Alcohol/week: 0.0 standard drinks of alcohol   Drug use: No   Sexual activity: Not Currently    Partners: Male    Birth control/protection: Surgical  Other Topics Concern   Not on file  Social History Narrative   Not on file   Social Determinants of Health   Financial Resource Strain: Low Risk  (12/02/2018)   Overall Financial Resource Strain (CARDIA)    Difficulty of Paying Living Expenses: Not very hard  Food Insecurity: No Food Insecurity (12/02/2018)   Hunger Vital Sign    Worried About Running Out of Food in the Last Year: Never true    Ran Out of Food in the Last Year: Never true  Transportation Needs: No Transportation Needs (12/02/2018)   PRAPARE - Hydrologist (Medical): No    Lack of Transportation (Non-Medical): No  Physical Activity: Inactive (12/18/2017)   Exercise Vital Sign    Days of Exercise per Week: 0 days    Minutes of Exercise per Session: 0 min  Stress: No Stress Concern Present (12/02/2018)   [place]    Feeling of Stress : Only a little  Social Connections: Unknown (12/02/2018)   Social Connection and Isolation Panel [NHANES]    Frequency of Communication with Friends and Family: More than three times a week    Frequency of Social Gatherings with Friends and Family: Not on file    Attends Religious Services: Not on file    Active Member of Clubs or Organizations: Not on file    Attends Archivist Meetings: Not on file    Marital Status: Not on file  Intimate Partner Violence: Not At Risk (12/02/2018)   Humiliation, Afraid, Rape, and Kick questionnaire    Fear of Current or Ex-Partner: No    Emotionally Abused: No    Physically Abused: No    Sexually Abused: No    Allergies: Allergies  Allergen Reactions   Requip [Ropinirole Hcl] Anaphylaxis    Throat closing   Celebrex [Celecoxib] Itching    Meloxicam Palpitations  Nitrofurantoin Palpitations    Current Medications: Current Outpatient Medications  Medication Sig Dispense Refill   acetaminophen (TYLENOL) 500 MG tablet Take 500-1,000 mg by mouth every 6 (six) hours as needed for moderate pain or fever.     acetaminophen-codeine (TYLENOL #3) 300-30 MG tablet Take 1 tablet by mouth every 12 (twelve) hours as needed for moderate pain or severe pain. (Patient not taking: Reported on 03/16/2022) 30 tablet 0   albuterol (VENTOLIN HFA) 108 (90 Base) MCG/ACT inhaler Inhale 1-2 puffs into the lungs every 6 (six) hours as needed for wheezing or shortness of breath.     amLODipine (NORVASC) 5 MG tablet Take 5 mg by mouth daily. 1 Tablet(s) By Mouth Daily     aspirin EC 81 MG tablet Take 1 tablet (81 mg total) by mouth daily. 30 tablet    atorvastatin (LIPITOR) 40 MG tablet Take 1 tablet (40 mg total) by mouth daily. 30 tablet 2   buPROPion (WELLBUTRIN XL) 300 MG 24 hr tablet Take 300 mg by mouth daily.     furosemide (LASIX) 20 MG tablet Take 20 mg by mouth daily. (Patient not taking: Reported on 03/16/2022)     isosorbide mononitrate (IMDUR) 120 MG 24 hr tablet Take 120 mg by mouth daily.     lisinopril (ZESTRIL) 20 MG tablet Take 20 mg by mouth daily.     lisinopril (ZESTRIL) 40 MG tablet Hold until followup with outpatient provider due to acute kidney injury.     metoprolol succinate (TOPROL-XL) 100 MG 24 hr tablet Take 100 mg by mouth daily.     nitroGLYCERIN (NITROSTAT) 0.4 MG SL tablet Place under the tongue.     ondansetron (ZOFRAN) 4 MG tablet Take 1 tablet (4 mg total) by mouth every 8 (eight) hours as needed for nausea or vomiting. 60 tablet 1   pantoprazole (PROTONIX) 40 MG tablet Take 1 tablet (40 mg total) by mouth 2 (two) times daily. 60 tablet 0   PREMARIN vaginal cream USE 1 GRAM INTRAVAGINALLY TWICE PER WEEK 30 g 3   rivaroxaban (XARELTO) 2.5 MG TABS tablet Take 1 tablet (2.5 mg total) by mouth 2 (two) times daily. 60 tablet 0    traZODone (DESYREL) 50 MG tablet Take 50 mg by mouth at bedtime.     zanubrutinib (BRUKINSA) 80 MG capsule Take 2 capsules (160 mg total) by mouth 2 (two) times daily. 120 capsule 1   No current facility-administered medications for this visit.    Review of Systems General: negative for fevers, changes in weight or night sweats Skin: negative for changes in moles or sores or rash Eyes: negative for changes in vision HEENT: negative for change in hearing, tinnitus, voice changes Pulmonary: negative for dyspnea, orthopnea, productive cough, wheezing Cardiac: negative for palpitations, pain Gastrointestinal: negative for nausea, vomiting, constipation, diarrhea, hematemesis, hematochezia Genitourinary/Sexual: negative for dysuria, retention, hematuria, incontinence Ob/Gyn:  negative for abnormal bleeding, or pain Musculoskeletal: negative for pain, joint pain, back pain Hematology: negative for easy bruising, abnormal bleeding Neurologic/Psych: negative for headaches, seizures, paralysis, weakness, numbness   Objective:  Physical Examination:  There were no vitals taken for this visit.   ECOG Performance Status: {DESC; ECOG PERFORMANCE STATUS (NQF 385):19948:p}  General appearance: {Exam; general:16600::"alert","cooperative","appears stated age"} HEENT:{exam; JIRCV:89381} Lymph node survey: {pe lymph nodes:310023::"axillary","inguinal","supraclavicular","non-palpable"} Cardiovascular: {HEART EXAM HEM/ONC:21750} Respiratory: {Auscultation Lung:20254} Breast exam: {pe breast exam:315056::"breasts appear normal, no suspicious masses, no skin or nipple changes or axillary nodes"}. Abdomen: {Exam; abdomen:14935::"no hernias","well healed incision"} Back: {back exam:311380::"inspection of back  is normal"} Extremities: {Exam; extremity:5109} Skin exam - {skin exam:315960::"normal coloration and turgor, no rashes, no suspicious skin lesions noted"}. Neurological exam reveals  {WLNLG:921194::"RDEYC, oriented, normal speech, no focal findings or movement disorder noted"}.  Pelvic: {Pelvic exam:5055::"exam chaperoned by nurse"};  Vulva: {external genitalia female:315901:p:"normal appearing vulva with no masses, tenderness or lesions"}; Vagina: {exam; vagina:12200:p}; Adnexa: {XKGYJE:563149:F:"WYOVZC adnexa in size, nontender and no masses"}; Uterus: {uterus:315905:p:"uterus is normal size, shape, consistency and nontender"}; Cervix: {exam; cervix:14595}; Rectal: {rectal female:311646::"not indicated"}    Lab Review Labs on site today: ***  Radiologic Imaging: ***    Assessment:  Karen [name] is a 76 y.o. female s/p TH (ovaries ***)  CLL (chronic lymphocytic leukemia) who is seen in consultation from Dr. Tasia Catchings for solid left ovarian mass.    Medical co-morbidities complicating care: HTN,Coronary artery disease with h/o unstable angina,  History of CVA, Paroxysmal atrial fibrillation   Plan:   Problem List Items Addressed This Visit   None   We discussed options for management including ***. Based on *** , we recommend {Blank single:19197::"definitive surgical evaluation","neoadjuvant chemotherapy","radiation","concurrent chemotherapy and radiation","further testing","***"}.  {Insert risk statement for surgery if preop, .gynoncsurrisk} Suggested return to clinic in  {1-10:18281} {units:10146}.    The patient's diagnosis, an outline of the further diagnostic and laboratory studies which will be required, the recommendation, and alternatives were discussed.  All questions were answered to the patient's satisfaction.  A total of *** minutes were spent with the patient/family today; ***% was spent in education, counseling and coordination of care for {Blank single:19197::"ovarian cancer","cervical cancer","endometrial cancer","pelvic mass","dysplasia","vulvar cancer","***"}.    Sohail Capraro Gaetana Michaelis, MD    CC:  Perrin Maltese, [address] [place],  [place] [address] [phone]

## 2022-05-03 NOTE — Telephone Encounter (Signed)
Patient did not show for her new gyn oncology appointment. Message sent to scheduling to attempt reschedule.

## 2022-05-04 ENCOUNTER — Other Ambulatory Visit (HOSPITAL_COMMUNITY): Payer: Self-pay

## 2022-05-08 ENCOUNTER — Ambulatory Visit: Payer: Self-pay | Admitting: Dermatology

## 2022-05-17 ENCOUNTER — Encounter: Payer: Self-pay | Admitting: Obstetrics and Gynecology

## 2022-05-17 ENCOUNTER — Inpatient Hospital Stay (HOSPITAL_BASED_OUTPATIENT_CLINIC_OR_DEPARTMENT_OTHER): Payer: Medicare Other | Admitting: Obstetrics and Gynecology

## 2022-05-17 VITALS — BP 177/78 | HR 50 | Temp 96.7°F | Resp 18

## 2022-05-17 DIAGNOSIS — I2510 Atherosclerotic heart disease of native coronary artery without angina pectoris: Secondary | ICD-10-CM | POA: Diagnosis not present

## 2022-05-17 DIAGNOSIS — Z79899 Other long term (current) drug therapy: Secondary | ICD-10-CM | POA: Diagnosis not present

## 2022-05-17 DIAGNOSIS — I480 Paroxysmal atrial fibrillation: Secondary | ICD-10-CM | POA: Diagnosis not present

## 2022-05-17 DIAGNOSIS — C9110 Chronic lymphocytic leukemia of B-cell type not having achieved remission: Secondary | ICD-10-CM | POA: Diagnosis not present

## 2022-05-17 DIAGNOSIS — Z8673 Personal history of transient ischemic attack (TIA), and cerebral infarction without residual deficits: Secondary | ICD-10-CM | POA: Diagnosis not present

## 2022-05-17 DIAGNOSIS — F17210 Nicotine dependence, cigarettes, uncomplicated: Secondary | ICD-10-CM | POA: Diagnosis not present

## 2022-05-17 DIAGNOSIS — K2100 Gastro-esophageal reflux disease with esophagitis, without bleeding: Secondary | ICD-10-CM | POA: Diagnosis not present

## 2022-05-17 DIAGNOSIS — E039 Hypothyroidism, unspecified: Secondary | ICD-10-CM | POA: Diagnosis not present

## 2022-05-17 DIAGNOSIS — I252 Old myocardial infarction: Secondary | ICD-10-CM | POA: Diagnosis not present

## 2022-05-17 DIAGNOSIS — R1900 Intra-abdominal and pelvic swelling, mass and lump, unspecified site: Secondary | ICD-10-CM

## 2022-05-17 DIAGNOSIS — F32A Depression, unspecified: Secondary | ICD-10-CM | POA: Diagnosis not present

## 2022-05-17 DIAGNOSIS — Z7982 Long term (current) use of aspirin: Secondary | ICD-10-CM | POA: Diagnosis not present

## 2022-05-17 DIAGNOSIS — E7800 Pure hypercholesterolemia, unspecified: Secondary | ICD-10-CM | POA: Diagnosis not present

## 2022-05-17 DIAGNOSIS — I10 Essential (primary) hypertension: Secondary | ICD-10-CM | POA: Diagnosis not present

## 2022-05-17 DIAGNOSIS — Z7901 Long term (current) use of anticoagulants: Secondary | ICD-10-CM | POA: Diagnosis not present

## 2022-05-17 NOTE — Progress Notes (Signed)
Gynecologic Oncology Consult Visit   Referring Provider: Dr Tasia Catchings, Dr Chancy Milroy  Chief Concern: Ovarian mass   Subjective:  Karen [name] is a 76 y.o. female who is seen in consultation for left ovarian mass.  She has been noted on imaging studies to have left ovarian mass since at least 2017. It was 4 cm then and had been followed before then.  See imaging reports below.  Most recently in 8/23 US showed 5.1 cm solid soft tissue mass in the left adnexa likely representing left ovarian origin.  No pelvic pain or pressure.    03/16/22  CA125 = 15 HE4 = 125  04/28/22 Saw Dr Tasia Catchings for follow up of CLL  Did not tolerate acalabrutinib due to possible allergic reaction " throat closing up". Retrospectively maybe due to esophagitis symptoms.' Previously not able to swallow Zanubrutinib due to esophagitis. Watchful waiting.     Problem List: Patient Active Problem List   Diagnosis Date Noted   Esophagitis 03/17/2022   Hypertensive urgency 03/09/2022   Odynophagia 03/09/2022   Coronary artery disease    Depression    History of CVA (cerebrovascular accident)    Paroxysmal atrial fibrillation ([place])    Esophageal dysphagia    Encounter for antineoplastic chemotherapy 01/26/2022   Chemotherapy-induced nausea 01/26/2022   Headache 01/26/2022   Goals of care, counseling/discussion 12/06/2021   Tobacco abuse 09/14/2020   Weakness    AKI (acute kidney injury) ([place])    Hyponatremia 08/07/2020   Dysgeusia 08/07/2020   CLL (chronic lymphocytic leukemia) ([place]) 12/18/2017   Unstable angina ([place]) 06/22/2017   Inclusion cyst of vulva 12/14/2016   Lumbar spondylosis 08/14/2016   Trochanteric bursitis of right hip 08/14/2016   Herpes simplex virus (HSV) infection 12/09/2015   Pelvic mass 12/09/2015   History of total vaginal hysterectomy (TVH) 11/25/2015   Vaginal odor 11/25/2015   History of UTI 11/25/2015   Vaginal atrophy 11/25/2015   Dyspareunia in female 11/25/2015   Tendinitis of wrist  05/26/2015    Past Medical History: Past Medical History:  Diagnosis Date   Anxiety    Artery occlusion    Asthma    Back pain    Bronchitis    Cancer ([place])    Skin Cancer   CLL (chronic lymphocytic leukemia) ([facility])    Coronary artery disease    Depression    Dyspnea    GERD (gastroesophageal reflux disease)    History of kidney stones    Hypercholesteremia    Hyperlipemia    Hypertension    Hypothyroidism    Myocardial infarction ([place]) 1999   Neuropathy    Restless leg syndrome    Sleep apnea    use C-PAP   Wears dentures    full upper, partial lower.  doesn't currently wear    Past Surgical History: Past Surgical History:  Procedure Laterality Date   COLONOSCOPY WITH PROPOFOL N/A 11/20/2016   Procedure: COLONOSCOPY WITH PROPOFOL;  Surgeon: Lucilla Lame, MD;  Location: [facility];  Service: Endoscopy;  Laterality: N/A;  sleep apnea   CORONARY ANGIOPLASTY WITH STENT PLACEMENT  2008   DILATION AND CURETTAGE OF UTERUS     ESOPHAGOGASTRODUODENOSCOPY (EGD) WITH PROPOFOL N/A 03/09/2022   Procedure: ESOPHAGOGASTRODUODENOSCOPY (EGD) WITH PROPOFOL;  Surgeon: Lucilla Lame, MD;  Location: [facility];  Service: Endoscopy;  Laterality: N/A;   EYE SURGERY Bilateral    Cataract Extraction with IOL   FRACTURE SURGERY     HAND SURGERY Left 12/2017   upper hand fracture repair  LEFT HEART CATH AND CORONARY ANGIOGRAPHY N/A 06/29/2017   Procedure: LEFT HEART CATH AND CORONARY ANGIOGRAPHY;  Surgeon: Dionisio David, MD;  Location: [facility];  Service: Cardiovascular;  Laterality: N/A;   LEFT HEART CATH AND CORONARY ANGIOGRAPHY Left 12/02/2018   Procedure: LEFT HEART CATH AND CORONARY ANGIOGRAPHY;  Surgeon: Dionisio David, MD;  Location: [facility];  Service: Cardiovascular;  Laterality: Left;   OPEN REDUCTION INTERNAL FIXATION (ORIF) DISTAL RADIAL FRACTURE Right 08/12/2018   Procedure: OPEN REDUCTION INTERNAL FIXATION (ORIF) DISTAL RADIAL FRACTURE;   Surgeon: [name], MD;  Location: [facility];  Service: Orthopedics;  Laterality: Right;   POLYPECTOMY  11/20/2016   Procedure: POLYPECTOMY;  Surgeon: Lucilla Lame, MD;  Location: [facility];  Service: Endoscopy;;   TUBAL LIGATION     VAGINAL HYSTERECTOMY     VULVECTOMY Right 01/31/2016   Procedure: WIDE EXCISION VULVECTOMY-RIGHT LABIA;  Surgeon: Brayton Mars, MD;  Location: [facility];  Service: Gynecology;  Laterality: Right;    OB History:  OB History  Gravida Para Term Preterm AB Living  8 5 5   3 5   SAB IAB Ectopic Multiple Live Births  3       5    # Outcome Date GA Lbr Len/2nd Weight Sex Delivery Anes PTL Lv  8 Term 53    M Vag-Spont   LIV  7 Term 46    F Vag-Spont   LIV  6 Term 1969    F Vag-Spont   LIV  5 Term 62    F Vag-Spont   LIV  4 Term 61    M Vag-Spont   LIV  3 SAB           2 SAB           1 SAB             Family History: Family History  Problem Relation Age of Onset   Heart disease Father    Stroke Mother    Hypertension Mother    Lung cancer Brother    Heart attack Brother    Congestive Heart Failure Sister    Cervical cancer Sister    Cancer Neg Hx    Diabetes Neg Hx    Breast cancer Neg Hx     Social History: Social History   Socioeconomic History   Marital status: Single    Spouse name: Not on file   Number of children: 5   Years of education: Not on file   Highest education level: Not on file  Occupational History   Occupation: part time job    Comment: Engineer, building services  Tobacco Use   Smoking status: Every Day    Packs/day: 1.00    Years: 35.00    Total pack years: 35.00    Types: Cigarettes   Smokeless tobacco: Current    Types: Snuff  Vaping Use   Vaping Use: Never used  Substance and Sexual Activity   Alcohol use: No    Alcohol/week: 0.0 standard drinks of alcohol   Drug use: No   Sexual activity: Not Currently    Partners: Male    Birth control/protection: Surgical  Other Topics Concern   Not on  file  Social History Narrative   Not on file   Social Determinants of Health   Financial Resource Strain: Low Risk  (12/02/2018)   Overall Financial Resource Strain (CARDIA)    Difficulty of Paying Living Expenses: Not very hard  Food Insecurity: No Food Insecurity (12/02/2018)   Hunger Vital Sign    Worried About Running Out of Food in the Last Year: Never true    Ran Out of Food in the Last Year: Never true  Transportation Needs: No Transportation Needs (12/02/2018)   PRAPARE - Hydrologist (Medical): No    Lack of Transportation (Non-Medical): No  Physical Activity: Inactive (12/18/2017)   Exercise Vital Sign    Days of Exercise per Week: 0 days    Minutes of Exercise per Session: 0 min  Stress: No Stress Concern Present (12/02/2018)   [place]    Feeling of Stress : Only a little  Social Connections: Unknown (12/02/2018)   Social Connection and Isolation Panel [NHANES]    Frequency of Communication with Friends and Family: More than three times a week    Frequency of Social Gatherings with Friends and Family: Not on file    Attends Religious Services: Not on file    Active Member of Clubs or Organizations: Not on file    Attends Archivist Meetings: Not on file    Marital Status: Not on file  Intimate Partner Violence: Not At Risk (12/02/2018)   Humiliation, Afraid, Rape, and Kick questionnaire    Fear of Current or Ex-Partner: No    Emotionally Abused: No    Physically Abused: No    Sexually Abused: No    Allergies: Allergies  Allergen Reactions   Requip [Ropinirole Hcl] Anaphylaxis    Throat closing   Celebrex [Celecoxib] Itching   Meloxicam Palpitations   Nitrofurantoin Palpitations    Current Medications: Current Outpatient Medications  Medication Sig Dispense Refill   acetaminophen (TYLENOL) 500 MG tablet Take 500-1,000 mg by mouth every 6 (six) hours as  needed for moderate pain or fever.     acetaminophen-codeine (TYLENOL #3) 300-30 MG tablet Take 1 tablet by mouth every 12 (twelve) hours as needed for moderate pain or severe pain. (Patient not taking: Reported on 03/16/2022) 30 tablet 0   albuterol (VENTOLIN HFA) 108 (90 Base) MCG/ACT inhaler Inhale 1-2 puffs into the lungs every 6 (six) hours as needed for wheezing or shortness of breath.     amLODipine (NORVASC) 5 MG tablet Take 5 mg by mouth daily. 1 Tablet(s) By Mouth Daily     aspirin EC 81 MG tablet Take 1 tablet (81 mg total) by mouth daily. 30 tablet    atorvastatin (LIPITOR) 40 MG tablet Take 1 tablet (40 mg total) by mouth daily. 30 tablet 2   buPROPion (WELLBUTRIN XL) 300 MG 24 hr tablet Take 300 mg by mouth daily.     furosemide (LASIX) 20 MG tablet Take 20 mg by mouth daily. (Patient not taking: Reported on 03/16/2022)     isosorbide mononitrate (IMDUR) 120 MG 24 hr tablet Take 120 mg by mouth daily.     lisinopril (ZESTRIL) 20 MG tablet Take 20 mg by mouth daily.     lisinopril (ZESTRIL) 40 MG tablet Hold until followup with outpatient provider due to acute kidney injury.     metoprolol succinate (TOPROL-XL) 100 MG 24 hr tablet Take 100 mg by mouth daily.     nitroGLYCERIN (NITROSTAT) 0.4 MG SL tablet Place under the tongue.     ondansetron (ZOFRAN) 4 MG tablet Take 1 tablet (4 mg total) by mouth every 8 (eight) hours as needed for nausea or vomiting. 60 tablet 1   pantoprazole (PROTONIX)  40 MG tablet Take 1 tablet (40 mg total) by mouth 2 (two) times daily. 60 tablet 0   PREMARIN vaginal cream USE 1 GRAM INTRAVAGINALLY TWICE PER WEEK 30 g 3   rivaroxaban (XARELTO) 2.5 MG TABS tablet Take 1 tablet (2.5 mg total) by mouth 2 (two) times daily. 60 tablet 0   traZODone (DESYREL) 50 MG tablet Take 50 mg by mouth at bedtime.     zanubrutinib (BRUKINSA) 80 MG capsule Take 2 capsules (160 mg total) by mouth 2 (two) times daily. 120 capsule 1   No current facility-administered medications  for this visit.   Review of Systems General: negative for, fevers, chills, fatigue, changes in sleep Skin: negative for changes in color, texture, moles or lesions Eyes: negative for, changes in vision, pain, diplopia HEENT: negative for, change in hearing, pain, discharge, tinnitus, vertigo, voice changes, sore throat, neck masses Breasts: negative for breast lumps Pulmonary: negative for, dyspnea, orthopnea, productive cough Cardiac: negative for, syncope, pain, discomfort, pressure Gastrointestinal: negative for, dysphagia, nausea, vomiting, jaundice, pain, constipation, diarrhea, hematemesis, hematochezia Musculoskeletal: negative for, pain, stiffness, swelling, range of motion limitation Hematology: negative for, bleeding Neurologic/Psych: negative for, headaches, seizures, paralysis, weakness, tremor, change in gait, change in sensation, mood swings, depression, anxiety, change in memory  Objective:  Physical Examination:  BP (!) 177/78 (Patient Position: Sitting)   Pulse (!) 50   Temp (!) 96.7 F (35.9 C)   Resp 18   SpO2 100%    ECOG Performance Status: 2 - Symptomatic, <50% confined to bed  General appearance: alert, cooperative, and appears stated age HEENT:extra ocular movement intact and thyroid without masses Lymph node survey: non-palpable, axillary, inguinal, supraclavicular Cardiovascular: regular rate and rhythm Respiratory: normal air entry, lungs clear to auscultation Abdomen: soft and no hernias Back: inspection of back is normal Extremities: extremities normal, atraumatic, no cyanosis or edema Skin exam - normal coloration and turgor, no rashes, no suspicious skin lesions noted. Neurological exam reveals alert, oriented, normal speech, no focal findings or movement disorder noted.  Pelvic: exam chaperoned by nurse;  Vulva: normal appearing vulva with no masses, tenderness or lesions; Vagina: normal vagina; Adnexa: small smooth mass noted in left ovary.  Rectal: confirms  Lab Review Labs on site today: Lab Results  Component Value Date   WBC 40.2 (H) 03/16/2022   HGB 11.6 (L) 03/16/2022   HCT 36.7 03/16/2022   MCV 93.9 03/16/2022   PLT 187 03/16/2022     Chemistry      Component Value Date/Time   NA 136 03/16/2022 1058   K 3.8 03/16/2022 1058   CL 105 03/16/2022 1058   CO2 26 03/16/2022 1058   BUN 10 03/16/2022 1058   CREATININE 1.16 (H) 03/16/2022 1058      Component Value Date/Time   CALCIUM 9.1 03/16/2022 1058   ALKPHOS 64 03/16/2022 1058   AST 12 (L) 03/16/2022 1058   ALT 9 03/16/2022 1058   BILITOT 0.2 (L) 03/16/2022 1058        Radiologic Imaging:  02/16/16 ULTRASOUND REPORT Location: [facility] Date of Service: 02/16/16 Indications: Follow up of left adnexal mass and recurrent UTI'S Findings:  The uterus is surgically absent s/p hysterectomy.   Vaginal cuff appears WNL.   Right Ovary is not visualized. Right adnexa is surveyed and appears WNL. Again seen is a left adnexal mass which is most likely the left ovary. Measurements today are 4.0 x 2.6 x 3.2  Cm. No change in size since scan of  12/01/15. Blood flow with accompanying arterial and venous doppler waveforms were demonstrated.   There is no free fluid in the cul de sac.   Impression: 1. Left adnexal mass is unchanged in size from prior scan. This has the appearance of a normal left ovary. Patient cannot remember if her ovaries were removed during her hysterectomy approximately 40 years ago.   Recommendations: 1.Clinical correlation with the patient's History and Physical Exam.    12/07/21  IMPRESSION: 1. No evidence for thoracic or abdominal aortic aneurysm or dissection. 2. Lymphadenopathy is identified in the chest, abdomen, and pelvis in this patient with reported history of CLL. Mediastinal lymphadenopathy is similar to prior chest CT of 2017. A hepatoduodenal ligament lymph node included on that prior chest CT has increased in  size in the interval. Additional abdominopelvic lymphadenopathy was not included on available prior imaging. Close follow-up recommended and comparison to old studies suggested to assess for progression. 3. Left pelvic soft tissue mass measures 5.5 x 4.4 cm. Left gonadal veins appear to arise directly from this lesion suggesting ovarian etiology. Pelvic ultrasound may prove helpful to further evaluate. 4. Bilateral renal lesions with attenuation too high to be simple cysts. These may be cysts complicated by proteinaceous debris or hemorrhage, but MRI abdomen with and without contrast recommended to confirm. 5. Diffuse colonic diverticulosis without diverticulitis. 6. Stable appearance of small right-sided pulmonary nodules, consistent with benign etiology. 7. Aortic Atherosclerosis (ICD10-I70.0).     03/09/22 CT scan FINDINGS: CTA CHEST FINDINGS   Cardiovascular: Pre contrast imaging shows no hyperdense crescent in the wall of the thoracic aorta to suggest the presence of an acute intramural hematoma. No thoracic aortic aneurysm. No dissection of the thoracic aorta. Pulmonary arteries are well opacified without evidence for pulmonary embolus   Mediastinum/Nodes: A 12 mm short axis precarinal lymph node is stable since 10/14/2015 when remeasured in a similar fashion on that prior study. No other mediastinal lymphadenopathy. No hilar lymphadenopathy. The esophagus has normal imaging features. There is no axillary lymphadenopathy.   Lungs/Pleura: 4 mm right upper lobe nodule on 26/7 is stable since the 2017 exam 7 mm perifissural nodule posterior right middle lobe is also unchanged since that prior study. Given interval stability of these nodules, no specific imaging follow-up is warranted. No focal airspace consolidation. There is no evidence of pleural effusion.   Musculoskeletal: No worrisome lytic or sclerotic osseous abnormality.   Review of the MIP images confirms the  above findings.   CTA ABDOMEN AND PELVIS FINDINGS   VASCULAR   Aorta: Normal caliber aorta without aneurysm, dissection, vasculitis or significant stenosis. Moderate atherosclerotic calcification evident.   Celiac: Patent without evidence of aneurysm, dissection, vasculitis or significant stenosis. Variant hepatic artery anatomy with left hepatic artery only arising from the celiac axis and another left hepatic artery arising from the SMA.   SMA: Patent without evidence of aneurysm, dissection, vasculitis or significant stenosis. Variant hepatic artery anatomy with replaced right hepatic artery arising from the SMA. As above, 1 of 2 left hepatic arteries arises from the SMA.   Renals: Both renal arteries are patent without evidence of aneurysm, dissection, vasculitis, fibromuscular dysplasia or significant stenosis.   IMA: Patent without evidence of aneurysm, dissection, vasculitis or significant stenosis.   Inflow: Patent without evidence of aneurysm, dissection, vasculitis or significant stenosis.   Veins: No obvious venous abnormality within the limitations of this arterial phase study.   Review of the MIP images confirms the above findings.   NON-VASCULAR  Hepatobiliary: 9 mm hypoattenuating lesion in the dome of the lateral segment left liver is stable since the 2017 exam consistent with benign etiology. No followup recommended. There is no evidence for gallstones, gallbladder wall thickening, or pericholecystic fluid. No intrahepatic or extrahepatic biliary dilation.   Pancreas: No focal mass lesion. No dilatation of the main duct. No intraparenchymal cyst. No peripancreatic edema.   Spleen: No splenomegaly. No focal mass lesion.   Adrenals/Urinary Tract: No adrenal nodule or mass. Cortical scarring noted both kidneys without hydronephrosis. 1.8 cm lesion upper pole right kidney has attenuation too high to be a simple cyst but cannot be definitively  characterize (see coronal image 90/series 8). Similar 17 mm lesion identified interpolar left kidney on coronal 93/8. Tiny hypoattenuating lesions are noted in both kidneys, too small to characterize but likely benign. No evidence for hydroureter. The urinary bladder appears normal for the degree of distention.   Stomach/Bowel: Stomach is unremarkable. No gastric wall thickening. No evidence of outlet obstruction. Duodenum is normally positioned as is the ligament of Treitz. No small bowel wall thickening. No small bowel dilatation. The terminal ileum is normal. The appendix is normal. No gross colonic mass. No colonic wall thickening. Diverticuli are seen scattered along the entire length of the colon without CT findings of diverticulitis.   Lymphatic: Lymphadenopathy is identified in the gastrohepatic and hepatoduodenal ligaments. Index hepatoduodenal ligament lymph node measures 2 cm short axis. This lymph node was 1.5 cm short axis on the 10/14/2015 exam. Associated mesenteric and retroperitoneal lymphadenopathy evident. 2 cm short axis left para-aortic node visible on 122/5, this area was not included on previous chest CT. Pelvic sidewall lymphadenopathy noted bilaterally with left pelvic sidewall lymph node measuring 1.3 cm short axis on 158/5.   Reproductive: 5.5 x 4.4 cm soft tissue mass in the left pelvis. Left gonadal veins appear to arise directly from this lesion suggesting ovarian etiology. No right-sided adnexal mass. Uterus surgically absent.   Other: No intraperitoneal free fluid.   Musculoskeletal: Sclerotic lesion in the left iliac bone adjacent to the SI joint is likely a bone island. No worrisome lytic or sclerotic osseous abnormality.   Review of the MIP images confirms the above findings.   IMPRESSION: 1. No evidence for thoracic or abdominal aortic aneurysm or dissection. 2. Lymphadenopathy is identified in the chest, abdomen, and pelvis in this patient  with reported history of CLL. Mediastinal lymphadenopathy is similar to prior chest CT of 2017. A hepatoduodenal ligament lymph node included on that prior chest CT has increased in size in the interval. Additional abdominopelvic lymphadenopathy was not included on available prior imaging. Close follow-up recommended and comparison to old studies suggested to assess for progression. 3. Left pelvic soft tissue mass measures 5.5 x 4.4 cm. Left gonadal veins appear to arise directly from this lesion suggesting ovarian etiology. Pelvic ultrasound may prove helpful to further evaluate. 4. Bilateral renal lesions with attenuation too high to be simple cysts. These may be cysts complicated by proteinaceous debris or hemorrhage, but MRI abdomen with and without contrast recommended to confirm. 5. Diffuse colonic diverticulosis without diverticulitis. 6. Stable appearance of small right-sided pulmonary nodules, consistent with benign etiology. 7. Aortic Atherosclerosis (ICD10-I70.0).     03/09/22 Pelvic US FINDINGS: Uterus   Measurements: Surgically absent.   Endometrium   Thickness: N/A.   Right ovary   Measurements: 2.6 x 1.8 x 1.7 cm = volume: 4.3 mL. Normal appearance/no adnexal mass.   Left ovary   Measurements: 5.1 x  3.4 x 4.3 cm = volume: 36 mL. Enlarged left ovary with homogeneous solid soft tissue echogenicity.   Pulsed Doppler evaluation of both ovaries demonstrates normal low-resistance arterial and venous waveforms.   Other findings   No abnormal free fluid.   IMPRESSION: 1. 5.1 cm solid soft tissue mass in the left adnexa likely representing left ovarian origin. Given that this patient has a reported history of CLL, there is likely prior imaging available outside are system. Consider comparison to previous outside studies to assess chronicity of this finding. 2. No evidence for ovarian torsion. 3. Status post hysterectomy.    Assessment:  Karen [name] is  a 76 y.o. female diagnosed with 5 cm solid left ovarian mass.  Minimal growth on imaging since 2017.  Asymptomatic.  Normal CA125 and HE4 slightly elevated at 125, but creatinine elevated.   Prior vaginal hysterectomy for benign disease.   Vulvectomy 2017 for benign disease. Hyperplastic colon polyp removed 2018  Medical co-morbidities complicating care: CLL on watchful waiting.  Plan:   Problem List Items Addressed This Visit       Other   Pelvic mass - Primary (Chronic)   We discussed options for management including continued expectant management.  Risk of malignancy very low in view of mass being present for > 6 years and stable.  Mass can be followed on imaging with [place] or CT in one year with Dr Tasia Catchings.  Suggested return to clinic if mass enlarges significantly or she develops LLQ pain.   The patient's diagnosis, an outline of the further diagnostic and laboratory studies which will be required, the recommendation, and alternatives were discussed.  All questions were answered to the patient's satisfaction.  Mellody Drown, MD    CC:  Perrin Maltese, [address] [place],  [place] [address] [phone]

## 2022-06-20 DIAGNOSIS — G4733 Obstructive sleep apnea (adult) (pediatric): Secondary | ICD-10-CM | POA: Diagnosis not present

## 2022-06-20 DIAGNOSIS — K219 Gastro-esophageal reflux disease without esophagitis: Secondary | ICD-10-CM | POA: Diagnosis not present

## 2022-06-20 DIAGNOSIS — I2510 Atherosclerotic heart disease of native coronary artery without angina pectoris: Secondary | ICD-10-CM | POA: Diagnosis not present

## 2022-06-20 DIAGNOSIS — R0602 Shortness of breath: Secondary | ICD-10-CM | POA: Diagnosis not present

## 2022-06-20 DIAGNOSIS — I340 Nonrheumatic mitral (valve) insufficiency: Secondary | ICD-10-CM | POA: Diagnosis not present

## 2022-06-20 DIAGNOSIS — I351 Nonrheumatic aortic (valve) insufficiency: Secondary | ICD-10-CM | POA: Diagnosis not present

## 2022-06-20 DIAGNOSIS — I739 Peripheral vascular disease, unspecified: Secondary | ICD-10-CM | POA: Diagnosis not present

## 2022-06-20 DIAGNOSIS — I10 Essential (primary) hypertension: Secondary | ICD-10-CM | POA: Diagnosis not present

## 2022-06-20 DIAGNOSIS — E782 Mixed hyperlipidemia: Secondary | ICD-10-CM | POA: Diagnosis not present

## 2022-06-28 ENCOUNTER — Encounter: Payer: Self-pay | Admitting: Oncology

## 2022-06-28 ENCOUNTER — Inpatient Hospital Stay (HOSPITAL_BASED_OUTPATIENT_CLINIC_OR_DEPARTMENT_OTHER): Payer: Medicare Other | Admitting: Oncology

## 2022-06-28 ENCOUNTER — Other Ambulatory Visit (HOSPITAL_COMMUNITY): Payer: Self-pay

## 2022-06-28 ENCOUNTER — Inpatient Hospital Stay: Payer: Medicare Other | Attending: Oncology

## 2022-06-28 ENCOUNTER — Telehealth: Payer: Self-pay

## 2022-06-28 VITALS — BP 176/85 | HR 45 | Temp 96.0°F | Wt 149.7 lb

## 2022-06-28 DIAGNOSIS — K209 Esophagitis, unspecified without bleeding: Secondary | ICD-10-CM | POA: Diagnosis not present

## 2022-06-28 DIAGNOSIS — R1900 Intra-abdominal and pelvic swelling, mass and lump, unspecified site: Secondary | ICD-10-CM | POA: Insufficient documentation

## 2022-06-28 DIAGNOSIS — Z5111 Encounter for antineoplastic chemotherapy: Secondary | ICD-10-CM | POA: Diagnosis not present

## 2022-06-28 DIAGNOSIS — C9110 Chronic lymphocytic leukemia of B-cell type not having achieved remission: Secondary | ICD-10-CM

## 2022-06-28 DIAGNOSIS — N1831 Chronic kidney disease, stage 3a: Secondary | ICD-10-CM | POA: Diagnosis not present

## 2022-06-28 DIAGNOSIS — N183 Chronic kidney disease, stage 3 unspecified: Secondary | ICD-10-CM | POA: Insufficient documentation

## 2022-06-28 DIAGNOSIS — F17210 Nicotine dependence, cigarettes, uncomplicated: Secondary | ICD-10-CM | POA: Insufficient documentation

## 2022-06-28 LAB — COMPREHENSIVE METABOLIC PANEL
ALT: 15 U/L (ref 0–44)
AST: 14 U/L — ABNORMAL LOW (ref 15–41)
Albumin: 4.4 g/dL (ref 3.5–5.0)
Alkaline Phosphatase: 78 U/L (ref 38–126)
Anion gap: 8 (ref 5–15)
BUN: 17 mg/dL (ref 8–23)
CO2: 28 mmol/L (ref 22–32)
Calcium: 9.8 mg/dL (ref 8.9–10.3)
Chloride: 93 mmol/L — ABNORMAL LOW (ref 98–111)
Creatinine, Ser: 1.22 mg/dL — ABNORMAL HIGH (ref 0.44–1.00)
GFR, Estimated: 46 mL/min — ABNORMAL LOW (ref >60)
Glucose, Bld: 96 mg/dL (ref 70–99)
Potassium: 3.9 mmol/L (ref 3.5–5.1)
Sodium: 129 mmol/L — ABNORMAL LOW (ref 135–145)
Total Bilirubin: 0.8 mg/dL (ref 0.3–1.2)
Total Protein: 7.2 g/dL (ref 6.5–8.1)

## 2022-06-28 LAB — CBC WITH DIFFERENTIAL/PLATELET
Abs Immature Granulocytes: 0.06 10*3/uL (ref 0.00–0.07)
Basophils Absolute: 0.1 10*3/uL (ref 0.0–0.1)
Basophils Relative: 0 %
Eosinophils Absolute: 0.1 10*3/uL (ref 0.0–0.5)
Eosinophils Relative: 0 %
HCT: 40.1 % (ref 36.0–46.0)
Hemoglobin: 13.1 g/dL (ref 12.0–15.0)
Immature Granulocytes: 0 %
Lymphocytes Relative: 84 %
Lymphs Abs: 34.7 10*3/uL — ABNORMAL HIGH (ref 0.7–4.0)
MCH: 30.2 pg (ref 26.0–34.0)
MCHC: 32.7 g/dL (ref 30.0–36.0)
MCV: 92.4 fL (ref 80.0–100.0)
Monocytes Absolute: 2.8 10*3/uL — ABNORMAL HIGH (ref 0.1–1.0)
Monocytes Relative: 7 %
Neutro Abs: 3.5 10*3/uL (ref 1.7–7.7)
Neutrophils Relative %: 9 %
Platelets: 215 10*3/uL (ref 150–400)
RBC: 4.34 MIL/uL (ref 3.87–5.11)
RDW: 13.7 % (ref 11.5–15.5)
Smear Review: NORMAL
WBC: 41.3 10*3/uL — ABNORMAL HIGH (ref 4.0–10.5)
nRBC: 0 % (ref 0.0–0.2)

## 2022-06-28 LAB — LACTATE DEHYDROGENASE: LDH: 124 U/L (ref 98–192)

## 2022-06-28 MED ORDER — ZANUBRUTINIB 80 MG PO CAPS
160.0000 mg | ORAL_CAPSULE | Freq: Two times a day (BID) | ORAL | 1 refills | Status: DC
  Filled 2022-06-28 (×2): qty 120, 30d supply, fill #0

## 2022-06-28 NOTE — Assessment & Plan Note (Addendum)
Progressive intra-abdominal lymphadenopathy, Did not tolerate acalabrutinib due to possible allergic reaction " throat closing up". Retrospectively maybe due to esophagitis symptoms.  Interval CT showed decrease of lymphadenopathy. Previously not able to swallow Zanubrutinib due to esophagitis.  Discussed with patient about option of continue watchful waiting versus try zanubrutinib again.  She would like to try zanubrutinib again.  Prescription was resent.

## 2022-06-28 NOTE — Telephone Encounter (Signed)
Oral Oncology Patient Advocate Encounter  After completing a benefits investigation, prior authorization for Brukinsa is not required at this time through [place].  Patient's copay is $0.     Berdine Addison, [facility] Patient [place]  [phone] (phone) [phone] (fax) 06/28/2022 10:57 AM

## 2022-06-28 NOTE — Assessment & Plan Note (Signed)
Recent images indicate ovarian origin. Normal CA 125 and elevated HE4.  Patient was seen by gynecology oncology Dr. Fransisca Connors.  Given that the mass has been stable for more than 6 years, Dr. Fransisca Connors recommends observation.

## 2022-06-28 NOTE — Progress Notes (Signed)
Hematology/Oncology Progress note [phone] [phone]         Patient Care Team: Perrin Maltese, MD as PCP - General (Internal Medicine) Earlie Server, MD as Consulting Physician (Hematology and Oncology) Clent Jacks, RN as Oncology Nurse Navigator  ASSESSMENT & PLAN:   CLL (chronic lymphocytic leukemia) [facility]) Progressive intra-abdominal lymphadenopathy, Did not tolerate acalabrutinib due to possible allergic reaction " throat closing up". Retrospectively maybe due to esophagitis symptoms.  Interval CT showed decrease of lymphadenopathy. Previously not able to swallow Zanubrutinib due to esophagitis.  Discussed with patient about option of continue watchful waiting versus try zanubrutinib again.  She would like to try zanubrutinib again.  Prescription was resent.   Pelvic mass Recent images indicate ovarian origin. Normal CA 125 and elevated HE4.  Patient was seen by gynecology oncology Dr. Fransisca Connors.  Given that the mass has been stable for more than 6 years, Dr. Fransisca Connors recommends observation.  Encounter for antineoplastic chemotherapy Chemotherapy plan as discussed above.  Esophagitis Continue PPI   CKD (chronic kidney disease) stage 3, GFR 30-59 ml/min (HCC) Encourage oral hydration.  Avoid nephrotoxins.  Orders Placed This Encounter  Procedures   CBC with Differential/Platelet    Standing Status:   Future    Standing Expiration Date:   06/29/2023   Comprehensive metabolic panel    Standing Status:   Future    Standing Expiration Date:   06/28/2023    Follow-up  3 weeks to evaluate tolerance.     Earlie Server, MD, PhD [facility] Hematology Oncology 06/28/2022     REASON FOR VISIT Follow up for treatment of CLL  HISTORY OF PRESENTING ILLNESS:  Karen [name] is a  76 y.o.  female with PMH listed below who presents for follow-up of CLL treatments. Oncology history summary listed as below. Oncology History  CLL (chronic lymphocytic  leukemia) ([place])  09/24/2015 Imaging   10/14/2015 chest without contrast Images were independently reviewed by me Small pulmonary nodules are stable over the past year.  Considered benign.  Several lymph nodes are borderline enlarged, including the lower right paratracheal lymph node and a portacaval lymph node. 08/27/2017 US abdomen complete, no hepato-splenomegaly.   12/18/2017 Initial Diagnosis   CLL (chronic lymphocytic leukemia)   07/30/2017, peripheral blood flow cytometry showed CD5+, CD23+, CD38 negative monoclonal B-cell population consistent with a diagnosis of CLL   06/27/2019 Cancer Staging   Staging form: Chronic Lymphocytic Leukemia / Small Lymphocytic Lymphoma, AJCC 8th Edition - Clinical: Modified Rai Stage I (Modified Rai risk: Intermediate, Lymphocytosis: Present, Adenopathy: Present, Organomegaly: Absent, Anemia: Absent, Thrombocytopenia: Absent) - Signed by Earlie Server, MD on 12/06/2021   11/27/2021 Imaging   Outside facility CT Aorta and run off w wo  showed moderate atherosclerotic aortic disease.  Diffuse lightest of moderate disease of the bilateral [place] vasculature.Diffuse mild stenotic disease of the bilateral SFAs. In addition, patient has soft tissue mass/lymph node along the left pelvic sidewall measures 4.9 x 4.7 cm, numerous peritoneum subcu tissue nodule, a deep right pelvic nodule and numerous enlarged mesenteric and retroperitoneal lymph nodes are identified.  For example a right mesenteric lymph node measures 2.4 x 2.2 cm.  The left periaortic lymph node measures 4.4 x 2.0 cm.  A pericardial lymph node measures 2.6 x 1.3 cm.   01/03/2022 Pathology Results   Right abdomen lymph node biopsy showed chronic lymphocytic leukemia/small lymphocytic lymphoma    01/09/2022 - 02/06/2022 Chemotherapy   Acalabrutinib 100 mg twice daily  Stopped due to sensation  of throat closing.   03/06/2022 -  Chemotherapy   Zanubrutinib, not able to swallow due to esophagitis   03/09/2022 -  03/12/2022 Hospital Admission   Patient reports that she took 1 day of zanubrutinib around 03/06/2022.  She has had difficulty swallowing the pills. She went to emergency room on 03/09/2022 for evaluation of dysphagia symptoms.  Symptoms occurs with both liquids and solids.  Also midsternal chest pain.  She was found to have hypertension emergency with systolic blood pressure greater than 200.  Patient states that she is being compliant with her blood pressure pills. Patient was treated with hydralazine, Cardizem drip.  Blood pressure improves. During the admission, she has had a EGD done by Dr. Allen Norris which showed esophagitis.  No biopsy was taken due to patient being on anticoagulation (aspirin, Plavix, low-dose Xarelto 2.5 mg twice daily] plan is for patient to follow-up with Dr. Alice Reichert outpatient for repeat EGD while off anticoagulation.  Her PPI dose was increased. She was discharged on metoprolol, lisinopril, amlodipine and nitrates   03/09/2022 Imaging   CT angio chest/abdomen/pelvis for dissection 1. No evidence for thoracic or abdominal aortic aneurysm or dissection. 2. Lymphadenopathy is identified in the chest, abdomen, and pelvis in this patient with reported history of CLL. Mediastinal lymphadenopathy is similar to prior chest CT of 2017. A hepatoduodenal ligament lymph node included on that prior chest CT has increased in size in the interval. Additional abdominopelvic lymphadenopathy was not included on available prior imaging. Close follow-up recommended and comparison to old studies suggested to assess for progression. 3. Left pelvic soft tissue mass measures 5.5 x 4.4 cm. Left gonadal veins appear to arise directly from this lesion suggesting ovarian etiology. Pelvic ultrasound may prove helpful to further evaluate. 4. Bilateral renal lesions with attenuation too high to be simple cysts. These may be cysts complicated by proteinaceous debris or hemorrhage, but MRI abdomen with and without  contrast recommended to confirm. 5. Diffuse colonic diverticulosis without diverticulitis.6. Stable appearance of small right-sided pulmonary nodules,consistent with benign etiology.7. Aortic Atherosclerosis (ICD10-I70.0).   03/09/2022 Imaging   Pelvic ultrasound showed 1. 5.1 cm solid soft tissue mass in the left adnexa likely representing left ovarian origin. Given that this patient has a reported history of CLL, there is likely prior imaging available outside are system. Consider comparison to previous outside studies to assess chronicity of this finding.2. No evidence for ovarian torsion.3. Status post hysterectomy.     INTERVAL HISTORY Karen [name] is a 76 y.o. female who has above history reviewed by me today presents for follow up visit for CLL 03/09/22 EGD showed Non-severe non-erosive esophagitis with no bleeding She take PPI. She feels well today. Swallowing is better.  Denies any new concerns.   Review of Systems  Constitutional:  Positive for fatigue. Negative for appetite change, chills, fever and unexpected weight change.  HENT:   Negative for hearing loss and voice change.   Eyes:  Negative for eye problems.  Respiratory:  Negative for chest tightness and cough.   Cardiovascular:  Negative for chest pain.  Gastrointestinal:  Negative for abdominal distention, abdominal pain, blood in stool and nausea.  Endocrine: Negative for hot flashes.  Genitourinary:  Negative for difficulty urinating and frequency.   Musculoskeletal:  Positive for back pain. Negative for arthralgias.  Skin:  Negative for itching and rash.  Neurological:  Negative for extremity weakness and headaches.  Hematological:  Negative for adenopathy.  Psychiatric/Behavioral:  Negative for confusion.      MEDICAL  HISTORY:  Past Medical History:  Diagnosis Date   Anxiety    Artery occlusion    Asthma    Back pain    Bronchitis    Cancer (HCC)    Skin Cancer   CLL (chronic lymphocytic leukemia)  (HCC)    Coronary artery disease    Depression    Dyspnea    GERD (gastroesophageal reflux disease)    Herpes    History of kidney stones    Hypercholesteremia    Hyperlipemia    Hypertension    Hypothyroidism    Myocardial infarction ([place]) 1999   Neuropathy    Restless leg syndrome    Sleep apnea    use C-PAP   Wears dentures    full upper, partial lower.  doesn't currently wear    SURGICAL HISTORY: Past Surgical History:  Procedure Laterality Date   COLONOSCOPY WITH PROPOFOL N/A 11/20/2016   Procedure: COLONOSCOPY WITH PROPOFOL;  Surgeon: Lucilla Lame, MD;  Location: [facility];  Service: Endoscopy;  Laterality: N/A;  sleep apnea   CORONARY ANGIOPLASTY WITH STENT PLACEMENT  2008   DILATION AND CURETTAGE OF UTERUS     ESOPHAGOGASTRODUODENOSCOPY (EGD) WITH PROPOFOL N/A 03/09/2022   Procedure: ESOPHAGOGASTRODUODENOSCOPY (EGD) WITH PROPOFOL;  Surgeon: Lucilla Lame, MD;  Location: [facility];  Service: Endoscopy;  Laterality: N/A;   EYE SURGERY Bilateral    Cataract Extraction with IOL   FRACTURE SURGERY     HAND SURGERY Left 12/2017   upper hand fracture repair   LEFT HEART CATH AND CORONARY ANGIOGRAPHY N/A 06/29/2017   Procedure: LEFT HEART CATH AND CORONARY ANGIOGRAPHY;  Surgeon: Dionisio David, MD;  Location: [facility];  Service: Cardiovascular;  Laterality: N/A;   LEFT HEART CATH AND CORONARY ANGIOGRAPHY Left 12/02/2018   Procedure: LEFT HEART CATH AND CORONARY ANGIOGRAPHY;  Surgeon: Dionisio David, MD;  Location: [facility];  Service: Cardiovascular;  Laterality: Left;   OPEN REDUCTION INTERNAL FIXATION (ORIF) DISTAL RADIAL FRACTURE Right 08/12/2018   Procedure: OPEN REDUCTION INTERNAL FIXATION (ORIF) DISTAL RADIAL FRACTURE;  Surgeon: [name], MD;  Location: [facility];  Service: Orthopedics;  Laterality: Right;   POLYPECTOMY  11/20/2016   Procedure: POLYPECTOMY;  Surgeon: Lucilla Lame, MD;  Location: [facility];  Service:  Endoscopy;;   TUBAL LIGATION     VAGINAL HYSTERECTOMY     VULVECTOMY Right 01/31/2016   Procedure: WIDE EXCISION VULVECTOMY-RIGHT LABIA;  Surgeon: Brayton Mars, MD;  Location: [facility];  Service: Gynecology;  Laterality: Right;    SOCIAL HISTORY: Social History   Socioeconomic History   Marital status: Single    Spouse name: Not on file   Number of children: 5   Years of education: Not on file   Highest education level: Not on file  Occupational History   Occupation: part time job    Comment: Engineer, building services  Tobacco Use   Smoking status: Every Day    Packs/day: 1.00    Years: 35.00    Total pack years: 35.00    Types: Cigarettes   Smokeless tobacco: Current    Types: Snuff  Vaping Use   Vaping Use: Never used  Substance and Sexual Activity   Alcohol use: No    Alcohol/week: 0.0 standard drinks of alcohol   Drug use: No   Sexual activity: Not Currently    Partners: Male    Birth control/protection: Surgical  Other Topics Concern   Not on file  Social History Narrative  Not on file   Social Determinants of Health   Financial Resource Strain: Low Risk  (12/02/2018)   Overall Financial Resource Strain (CARDIA)    Difficulty of Paying Living Expenses: Not very hard  Food Insecurity: No Food Insecurity (12/02/2018)   Hunger Vital Sign    Worried About Running Out of Food in the Last Year: Never true    Ran Out of Food in the Last Year: Never true  Transportation Needs: No Transportation Needs (12/02/2018)   PRAPARE - Hydrologist (Medical): No    Lack of Transportation (Non-Medical): No  Physical Activity: Inactive (12/18/2017)   Exercise Vital Sign    Days of Exercise per Week: 0 days    Minutes of Exercise per Session: 0 min  Stress: No Stress Concern Present (12/02/2018)   [place]    Feeling of Stress : Only a little  Social Connections: Unknown (12/02/2018)    Social Connection and Isolation Panel [NHANES]    Frequency of Communication with Friends and Family: More than three times a week    Frequency of Social Gatherings with Friends and Family: Not on file    Attends Religious Services: Not on file    Active Member of Clubs or Organizations: Not on file    Attends Archivist Meetings: Not on file    Marital Status: Not on file  Intimate Partner Violence: Not At Risk (12/02/2018)   Humiliation, Afraid, Rape, and Kick questionnaire    Fear of Current or Ex-Partner: No    Emotionally Abused: No    Physically Abused: No    Sexually Abused: No    FAMILY HISTORY: Family History  Problem Relation Age of Onset   Heart disease Father    Stroke Mother    Hypertension Mother    Lung cancer Brother    Heart attack Brother    Congestive Heart Failure Sister    Cervical cancer Sister    Cancer Neg Hx    Diabetes Neg Hx    Breast cancer Neg Hx     ALLERGIES:  is allergic to requip [ropinirole hcl], celebrex [celecoxib], meloxicam, and nitrofurantoin.  MEDICATIONS:  Current Outpatient Medications  Medication Sig Dispense Refill   acetaminophen (TYLENOL) 500 MG tablet Take 500-1,000 mg by mouth every 6 (six) hours as needed for moderate pain or fever.     albuterol (VENTOLIN HFA) 108 (90 Base) MCG/ACT inhaler Inhale 1-2 puffs into the lungs every 6 (six) hours as needed for wheezing or shortness of breath.     amLODipine (NORVASC) 5 MG tablet Take 5 mg by mouth daily. 1 Tablet(s) By Mouth Daily     aspirin EC 81 MG tablet Take 1 tablet (81 mg total) by mouth daily. 30 tablet    atorvastatin (LIPITOR) 40 MG tablet Take 1 tablet (40 mg total) by mouth daily. 30 tablet 2   buPROPion (WELLBUTRIN XL) 300 MG 24 hr tablet Take 300 mg by mouth daily.     isosorbide mononitrate (IMDUR) 120 MG 24 hr tablet Take 120 mg by mouth daily.     lisinopril-hydrochlorothiazide (ZESTORETIC) 20-25 MG tablet Take 1 tablet by mouth daily.     metoprolol  succinate (TOPROL-XL) 100 MG 24 hr tablet Take 100 mg by mouth daily.     nitroGLYCERIN (NITROSTAT) 0.4 MG SL tablet Place under the tongue.     ondansetron (ZOFRAN) 4 MG tablet Take 1 tablet (4 mg total) by  mouth every 8 (eight) hours as needed for nausea or vomiting. 60 tablet 1   pantoprazole (PROTONIX) 40 MG tablet Take 1 tablet (40 mg total) by mouth 2 (two) times daily. 60 tablet 0   PREMARIN vaginal cream USE 1 GRAM INTRAVAGINALLY TWICE PER WEEK 30 g 3   rivaroxaban (XARELTO) 2.5 MG TABS tablet Take 1 tablet (2.5 mg total) by mouth 2 (two) times daily. 60 tablet 0   zanubrutinib (BRUKINSA) 80 MG capsule Take 2 capsules (160 mg total) by mouth 2 (two) times daily. 120 capsule 1   acetaminophen-codeine (TYLENOL #3) 300-30 MG tablet Take 1 tablet by mouth every 12 (twelve) hours as needed for moderate pain or severe pain. (Patient not taking: Reported on 03/16/2022) 30 tablet 0   furosemide (LASIX) 20 MG tablet Take 20 mg by mouth daily. (Patient not taking: Reported on 03/16/2022)     lisinopril (ZESTRIL) 20 MG tablet Take 20 mg by mouth daily. (Patient not taking: Reported on 06/28/2022)     lisinopril (ZESTRIL) 40 MG tablet Hold until followup with outpatient provider due to acute kidney injury. (Patient not taking: Reported on 06/28/2022)     traZODone (DESYREL) 50 MG tablet Take 50 mg by mouth at bedtime. (Patient not taking: Reported on 06/28/2022)     No current facility-administered medications for this visit.     PHYSICAL EXAMINATION: ECOG PERFORMANCE STATUS: 1 - Symptomatic but completely ambulatory Vitals:   06/28/22 1010  BP: (!) 176/85  Pulse: (!) 45  Temp: (!) 96 F (35.6 C)  SpO2: 99%   Filed Weights   06/28/22 1010  Weight: 149 lb 11.2 oz (67.9 kg)    Physical Exam Constitutional:      General: She is not in acute distress.    Appearance: She is not diaphoretic.  HENT:     Head: Normocephalic and atraumatic.     Nose: Nose normal.     Mouth/Throat:     Pharynx:  No oropharyngeal exudate.  Eyes:     General: No scleral icterus.       Left eye: No discharge.     Conjunctiva/sclera: Conjunctivae normal.     Pupils: Pupils are equal, round, and reactive to light.  Neck:     Vascular: No JVD.  Cardiovascular:     Rate and Rhythm: Normal rate and regular rhythm.     Heart sounds: No murmur heard. Pulmonary:     Effort: Pulmonary effort is normal. No respiratory distress.     Breath sounds: No rales.  Chest:     Chest wall: No tenderness.  Abdominal:     General: Bowel sounds are normal. There is no distension.     Palpations: Abdomen is soft.     Tenderness: There is no abdominal tenderness.  Musculoskeletal:        General: Normal range of motion.     Cervical back: Normal range of motion and neck supple.  Lymphadenopathy:     Cervical: No cervical adenopathy.  Skin:    General: Skin is warm and dry.     Findings: No erythema.  Neurological:     Mental Status: She is alert and oriented to person, place, and time.     Cranial Nerves: No cranial nerve deficit.     Motor: No abnormal muscle tone.     Coordination: Coordination normal.  Psychiatric:        Mood and Affect: Affect normal.        Judgment: Judgment normal.  LABORATORY DATA:  I have reviewed the data as listed Lab Results  Component Value Date   WBC 41.3 (H) 06/28/2022   HGB 13.1 06/28/2022   HCT 40.1 06/28/2022   MCV 92.4 06/28/2022   PLT 215 06/28/2022

## 2022-06-28 NOTE — Assessment & Plan Note (Signed)
Continue PPI ?

## 2022-06-28 NOTE — Assessment & Plan Note (Signed)
Chemotherapy plan as discussed above.

## 2022-06-28 NOTE — Assessment & Plan Note (Signed)
Encourage oral hydration.  Avoid nephrotoxins.

## 2022-06-30 DIAGNOSIS — R0602 Shortness of breath: Secondary | ICD-10-CM | POA: Diagnosis not present

## 2022-07-06 DIAGNOSIS — E782 Mixed hyperlipidemia: Secondary | ICD-10-CM | POA: Diagnosis not present

## 2022-07-06 DIAGNOSIS — I10 Essential (primary) hypertension: Secondary | ICD-10-CM | POA: Diagnosis not present

## 2022-07-06 DIAGNOSIS — C9111 Chronic lymphocytic leukemia of B-cell type in remission: Secondary | ICD-10-CM | POA: Diagnosis not present

## 2022-07-06 DIAGNOSIS — I2510 Atherosclerotic heart disease of native coronary artery without angina pectoris: Secondary | ICD-10-CM | POA: Diagnosis not present

## 2022-07-06 DIAGNOSIS — I498 Other specified cardiac arrhythmias: Secondary | ICD-10-CM | POA: Diagnosis not present

## 2022-07-06 DIAGNOSIS — G4733 Obstructive sleep apnea (adult) (pediatric): Secondary | ICD-10-CM | POA: Diagnosis not present

## 2022-07-06 DIAGNOSIS — R5383 Other fatigue: Secondary | ICD-10-CM | POA: Diagnosis not present

## 2022-07-07 DIAGNOSIS — F17200 Nicotine dependence, unspecified, uncomplicated: Secondary | ICD-10-CM | POA: Diagnosis not present

## 2022-07-07 DIAGNOSIS — I2510 Atherosclerotic heart disease of native coronary artery without angina pectoris: Secondary | ICD-10-CM | POA: Diagnosis not present

## 2022-07-07 DIAGNOSIS — E782 Mixed hyperlipidemia: Secondary | ICD-10-CM | POA: Diagnosis not present

## 2022-07-07 DIAGNOSIS — G4733 Obstructive sleep apnea (adult) (pediatric): Secondary | ICD-10-CM | POA: Diagnosis not present

## 2022-07-07 DIAGNOSIS — I351 Nonrheumatic aortic (valve) insufficiency: Secondary | ICD-10-CM | POA: Diagnosis not present

## 2022-07-07 DIAGNOSIS — I10 Essential (primary) hypertension: Secondary | ICD-10-CM | POA: Diagnosis not present

## 2022-07-07 DIAGNOSIS — K219 Gastro-esophageal reflux disease without esophagitis: Secondary | ICD-10-CM | POA: Diagnosis not present

## 2022-07-07 DIAGNOSIS — I739 Peripheral vascular disease, unspecified: Secondary | ICD-10-CM | POA: Diagnosis not present

## 2022-07-07 DIAGNOSIS — R55 Syncope and collapse: Secondary | ICD-10-CM | POA: Diagnosis not present

## 2022-07-07 DIAGNOSIS — I340 Nonrheumatic mitral (valve) insufficiency: Secondary | ICD-10-CM | POA: Diagnosis not present

## 2022-07-18 ENCOUNTER — Other Ambulatory Visit (HOSPITAL_COMMUNITY): Payer: Self-pay

## 2022-07-19 ENCOUNTER — Inpatient Hospital Stay: Payer: Medicare Other

## 2022-07-19 ENCOUNTER — Inpatient Hospital Stay: Payer: Medicare Other | Admitting: Oncology

## 2022-07-19 NOTE — Assessment & Plan Note (Deleted)
Progressive intra-abdominal lymphadenopathy, Did not tolerate acalabrutinib due to possible allergic reaction " throat closing up". Retrospectively maybe due to esophagitis symptoms.  Interval CT showed decrease of lymphadenopathy. Previously not able to swallow Zanubrutinib due to esophagitis.  Discussed with patient about option of continue watchful waiting versus try zanubrutinib again.  She would like to try zanubrutinib again.  Prescription was resent.

## 2022-07-21 DIAGNOSIS — I351 Nonrheumatic aortic (valve) insufficiency: Secondary | ICD-10-CM | POA: Diagnosis not present

## 2022-07-21 DIAGNOSIS — K219 Gastro-esophageal reflux disease without esophagitis: Secondary | ICD-10-CM | POA: Diagnosis not present

## 2022-07-21 DIAGNOSIS — R55 Syncope and collapse: Secondary | ICD-10-CM | POA: Diagnosis not present

## 2022-07-21 DIAGNOSIS — I10 Essential (primary) hypertension: Secondary | ICD-10-CM | POA: Diagnosis not present

## 2022-07-21 DIAGNOSIS — F17200 Nicotine dependence, unspecified, uncomplicated: Secondary | ICD-10-CM | POA: Diagnosis not present

## 2022-07-21 DIAGNOSIS — I2510 Atherosclerotic heart disease of native coronary artery without angina pectoris: Secondary | ICD-10-CM | POA: Diagnosis not present

## 2022-07-21 DIAGNOSIS — I739 Peripheral vascular disease, unspecified: Secondary | ICD-10-CM | POA: Diagnosis not present

## 2022-07-21 DIAGNOSIS — G4733 Obstructive sleep apnea (adult) (pediatric): Secondary | ICD-10-CM | POA: Diagnosis not present

## 2022-07-21 DIAGNOSIS — I340 Nonrheumatic mitral (valve) insufficiency: Secondary | ICD-10-CM | POA: Diagnosis not present

## 2022-08-02 ENCOUNTER — Inpatient Hospital Stay (HOSPITAL_BASED_OUTPATIENT_CLINIC_OR_DEPARTMENT_OTHER): Payer: 59 | Admitting: Oncology

## 2022-08-02 ENCOUNTER — Inpatient Hospital Stay: Payer: 59 | Attending: Oncology

## 2022-08-02 ENCOUNTER — Encounter: Payer: Self-pay | Admitting: Oncology

## 2022-08-02 VITALS — BP 205/87 | HR 79 | Temp 96.2°F | Wt 146.9 lb

## 2022-08-02 DIAGNOSIS — R1900 Intra-abdominal and pelvic swelling, mass and lump, unspecified site: Secondary | ICD-10-CM | POA: Insufficient documentation

## 2022-08-02 DIAGNOSIS — I10 Essential (primary) hypertension: Secondary | ICD-10-CM

## 2022-08-02 DIAGNOSIS — K209 Esophagitis, unspecified without bleeding: Secondary | ICD-10-CM | POA: Diagnosis not present

## 2022-08-02 DIAGNOSIS — R590 Localized enlarged lymph nodes: Secondary | ICD-10-CM | POA: Diagnosis not present

## 2022-08-02 DIAGNOSIS — N1831 Chronic kidney disease, stage 3a: Secondary | ICD-10-CM | POA: Diagnosis not present

## 2022-08-02 DIAGNOSIS — C9110 Chronic lymphocytic leukemia of B-cell type not having achieved remission: Secondary | ICD-10-CM | POA: Insufficient documentation

## 2022-08-02 DIAGNOSIS — F17210 Nicotine dependence, cigarettes, uncomplicated: Secondary | ICD-10-CM | POA: Diagnosis not present

## 2022-08-02 DIAGNOSIS — I129 Hypertensive chronic kidney disease with stage 1 through stage 4 chronic kidney disease, or unspecified chronic kidney disease: Secondary | ICD-10-CM | POA: Insufficient documentation

## 2022-08-02 DIAGNOSIS — N183 Chronic kidney disease, stage 3 unspecified: Secondary | ICD-10-CM | POA: Diagnosis not present

## 2022-08-02 LAB — COMPREHENSIVE METABOLIC PANEL
ALT: 11 U/L (ref 0–44)
AST: 14 U/L — ABNORMAL LOW (ref 15–41)
Albumin: 4.2 g/dL (ref 3.5–5.0)
Alkaline Phosphatase: 60 U/L (ref 38–126)
Anion gap: 8 (ref 5–15)
BUN: 12 mg/dL (ref 8–23)
CO2: 27 mmol/L (ref 22–32)
Calcium: 9.7 mg/dL (ref 8.9–10.3)
Chloride: 103 mmol/L (ref 98–111)
Creatinine, Ser: 1.11 mg/dL — ABNORMAL HIGH (ref 0.44–1.00)
GFR, Estimated: 51 mL/min — ABNORMAL LOW (ref >60)
Glucose, Bld: 92 mg/dL (ref 70–99)
Potassium: 3.9 mmol/L (ref 3.5–5.1)
Sodium: 138 mmol/L (ref 135–145)
Total Bilirubin: 0.2 mg/dL — ABNORMAL LOW (ref 0.3–1.2)
Total Protein: 6.9 g/dL (ref 6.5–8.1)

## 2022-08-02 LAB — CBC WITH DIFFERENTIAL/PLATELET
Abs Immature Granulocytes: 0.06 10*3/uL (ref 0.00–0.07)
Basophils Absolute: 0.1 10*3/uL (ref 0.0–0.1)
Basophils Relative: 0 %
Eosinophils Absolute: 0.1 10*3/uL (ref 0.0–0.5)
Eosinophils Relative: 0 %
HCT: 36.9 % (ref 36.0–46.0)
Hemoglobin: 12 g/dL (ref 12.0–15.0)
Immature Granulocytes: 0 %
Lymphocytes Relative: 87 %
Lymphs Abs: 31.3 10*3/uL — ABNORMAL HIGH (ref 0.7–4.0)
MCH: 30.4 pg (ref 26.0–34.0)
MCHC: 32.5 g/dL (ref 30.0–36.0)
MCV: 93.4 fL (ref 80.0–100.0)
Monocytes Absolute: 1.7 10*3/uL — ABNORMAL HIGH (ref 0.1–1.0)
Monocytes Relative: 5 %
Neutro Abs: 2.9 10*3/uL (ref 1.7–7.7)
Neutrophils Relative %: 8 %
Platelets: 243 10*3/uL (ref 150–400)
RBC: 3.95 MIL/uL (ref 3.87–5.11)
RDW: 13.5 % (ref 11.5–15.5)
Smear Review: NORMAL
WBC: 36.2 10*3/uL — ABNORMAL HIGH (ref 4.0–10.5)
nRBC: 0 % (ref 0.0–0.2)

## 2022-08-02 NOTE — Assessment & Plan Note (Signed)
Progressive intra-abdominal lymphadenopathy, She is not complaint with her medication.  I recommend her to repeat PET scan for further evaluation.  Discussed about home care for her medication management and she declined.

## 2022-08-02 NOTE — Assessment & Plan Note (Signed)
Continue PPI ?

## 2022-08-02 NOTE — Assessment & Plan Note (Signed)
Recent images indicate ovarian origin. Normal CA 125 and elevated HE4.  Patient was seen by gynecology oncology Dr. Fransisca Connors.  Given that the mass has been stable for more than 6 years, Dr. Fransisca Connors recommends observation.

## 2022-08-02 NOTE — Assessment & Plan Note (Signed)
Encourage oral hydration.  Avoid nephrotoxins.

## 2022-08-02 NOTE — Progress Notes (Signed)
Hematology/Oncology Progress note [phone] [phone]         Patient Care Team: Perrin Maltese, MD as PCP - General (Internal Medicine) Earlie Server, MD as Consulting Physician (Hematology and Oncology) Clent Jacks, RN as Oncology Nurse Navigator  ASSESSMENT & PLAN:   CLL (chronic lymphocytic leukemia) Karen [name]) Progressive intra-abdominal lymphadenopathy, She is not complaint with her medication.  I recommend her to repeat PET scan for further evaluation.  Discussed about home care for her medication management and she declined.   Pelvic mass Recent images indicate ovarian origin. Normal CA 125 and elevated HE4.  Patient was seen by gynecology oncology Dr. Fransisca Connors.  Given that the mass has been stable for more than 6 years, Dr. Fransisca Connors recommends observation.  Esophagitis Continue PPI   CKD (chronic kidney disease) stage 3, GFR 30-59 ml/min (HCC) Encourage oral hydration.  Avoid nephrotoxins.  Uncontrolled hypertension Due to medication non compliance She declines ER evaluation. She plans to take BP med at home.   Orders Placed This Encounter  Procedures   NM PET Image Initial (PI) Skull Base To Thigh    Standing Status:   Future    Standing Expiration Date:   08/02/2023    Order Specific Question:   If indicated for the ordered procedure, I authorize the administration of a radiopharmaceutical per Radiology protocol    Answer:   Yes    Order Specific Question:   Preferred imaging location?    Answer:   [name]   Ambulatory Referral to Karen [name] Nutrition    Referral Priority:   Routine    Referral Type:   Consultation    Referral Reason:   Specialty Services Required    Number of Visits Requested:   1    Follow-up  After PET   Earlie Server, MD, PhD Karen [name] Hematology Oncology 08/02/2022     REASON FOR VISIT Follow up for treatment of CLL  HISTORY OF PRESENTING ILLNESS:  Karen [name] is a  77 y.o.  female with PMH  listed below who presents for follow-up of CLL treatments. Oncology history summary listed as below. Oncology History  CLL (chronic lymphocytic leukemia) (Karen [name])  09/24/2015 Imaging   10/14/2015 chest without contrast Images were independently reviewed by me Small pulmonary nodules are stable over the past year.  Considered benign.  Several lymph nodes are borderline enlarged, including the lower right paratracheal lymph node and a portacaval lymph node. 08/27/2017 US abdomen complete, no hepato-splenomegaly.   12/18/2017 Initial Diagnosis   CLL (chronic lymphocytic leukemia)   07/30/2017, peripheral blood flow cytometry showed CD5+, CD23+, CD38 negative monoclonal B-cell population consistent with a diagnosis of CLL   06/27/2019 Cancer Staging   Staging form: Chronic Lymphocytic Leukemia / Small Lymphocytic Lymphoma, AJCC 8th Edition - Clinical: Modified Rai Stage I (Modified Rai risk: Intermediate, Lymphocytosis: Present, Adenopathy: Present, Organomegaly: Absent, Anemia: Absent, Thrombocytopenia: Absent) - Signed by Earlie Server, MD on 12/06/2021   11/27/2021 Imaging   Outside facility CT Aorta and run off w wo  showed moderate atherosclerotic aortic disease.  Diffuse lightest of moderate disease of the bilateral [place] vasculature.Diffuse mild stenotic disease of the bilateral SFAs. In addition, patient has soft tissue mass/lymph node along the left pelvic sidewall measures 4.9 x 4.7 cm, numerous peritoneum subcu tissue nodule, a deep right pelvic nodule and numerous enlarged mesenteric and retroperitoneal lymph nodes are identified.  For example a right mesenteric lymph node measures 2.4 x 2.2 cm.  The left periaortic lymph node  measures 4.4 x 2.0 cm.  A pericardial lymph node measures 2.6 x 1.3 cm.   01/03/2022 Pathology Results   Right abdomen lymph node biopsy showed chronic lymphocytic leukemia/small lymphocytic lymphoma    01/09/2022 - 02/06/2022 Chemotherapy   Acalabrutinib 100 mg twice daily   Stopped due to sensation of throat closing.   03/06/2022 -  Chemotherapy   Zanubrutinib, not able to swallow due to esophagitis   03/09/2022 - 03/12/2022 Hospital Admission   Patient reports that she took 1 day of zanubrutinib around 03/06/2022.  She has had difficulty swallowing the pills. She went to emergency room on 03/09/2022 for evaluation of dysphagia symptoms.  Symptoms occurs with both liquids and solids.  Also midsternal chest pain.  She was found to have hypertension emergency with systolic blood pressure greater than 200.  Patient states that she is being compliant with her blood pressure pills. Patient was treated with hydralazine, Cardizem drip.  Blood pressure improves. During the admission, she has had a EGD done by Dr. Allen Norris which showed esophagitis.  No biopsy was taken due to patient being on anticoagulation (aspirin, Plavix, low-dose Xarelto 2.5 mg twice daily] plan is for patient to follow-up with Dr. Alice Reichert outpatient for repeat EGD while off anticoagulation.  Her PPI dose was increased. She was discharged on metoprolol, lisinopril, amlodipine and nitrates   03/09/2022 Imaging   CT angio chest/abdomen/pelvis for dissection 1. No evidence for thoracic or abdominal aortic aneurysm or dissection. 2. Lymphadenopathy is identified in the chest, abdomen, and pelvis in this patient with reported history of CLL. Mediastinal lymphadenopathy is similar to prior chest CT of 2017. A hepatoduodenal ligament lymph node included on that prior chest CT has increased in size in the interval. Additional abdominopelvic lymphadenopathy was not included on available prior imaging. Close follow-up recommended and comparison to old studies suggested to assess for progression. 3. Left pelvic soft tissue mass measures 5.5 x 4.4 cm. Left gonadal veins appear to arise directly from this lesion suggesting ovarian etiology. Pelvic ultrasound may prove helpful to further evaluate. 4. Bilateral renal lesions  with attenuation too high to be simple cysts. These may be cysts complicated by proteinaceous debris or hemorrhage, but MRI abdomen with and without contrast recommended to confirm. 5. Diffuse colonic diverticulosis without diverticulitis.6. Stable appearance of small right-sided pulmonary nodules,consistent with benign etiology.7. Aortic Atherosclerosis (ICD10-I70.0).   03/09/2022 Imaging   Pelvic ultrasound showed 1. 5.1 cm solid soft tissue mass in the left adnexa likely representing left ovarian origin. Given that this patient has a reported history of CLL, there is likely prior imaging available outside are system. Consider comparison to previous outside studies to assess chronicity of this finding.2. No evidence for ovarian torsion.3. Status post hysterectomy.     INTERVAL HISTORY Karen [name] is a 77 y.o. female who has above history reviewed by me today presents for follow up visit for CLL 03/09/22 EGD showed Non-severe non-erosive esophagitis with no bleeding She take PPI. She feels well today. + headache.  She does not take her BP medication routinely.  Also not taking Zanubrutinib.  Denies any new concerns.   Review of Systems  Constitutional:  Positive for fatigue. Negative for appetite change, chills, fever and unexpected weight change.  HENT:   Negative for hearing loss and voice change.   Eyes:  Negative for eye problems.  Respiratory:  Negative for chest tightness and cough.   Cardiovascular:  Negative for chest pain.  Gastrointestinal:  Negative for abdominal distention, abdominal pain, blood  in stool and nausea.  Endocrine: Negative for hot flashes.  Genitourinary:  Negative for difficulty urinating and frequency.   Musculoskeletal:  Positive for back pain. Negative for arthralgias.  Skin:  Negative for itching and rash.  Neurological:  Negative for extremity weakness and headaches.  Hematological:  Negative for adenopathy.  Psychiatric/Behavioral:  Negative for  confusion.      MEDICAL HISTORY:  Past Medical History:  Diagnosis Date   Anxiety    Artery occlusion    Asthma    Back pain    Bronchitis    Cancer (HCC)    Skin Cancer   CLL (chronic lymphocytic leukemia) (HCC)    Coronary artery disease    Depression    Dyspnea    GERD (gastroesophageal reflux disease)    Herpes    History of kidney stones    Hypercholesteremia    Hyperlipemia    Hypertension    Hypothyroidism    Myocardial infarction ([place]) 1999   Neuropathy    Restless leg syndrome    Sleep apnea    use C-PAP   Wears dentures    full upper, partial lower.  doesn't currently wear    SURGICAL HISTORY: Past Surgical History:  Procedure Laterality Date   COLONOSCOPY WITH PROPOFOL N/A 11/20/2016   Procedure: COLONOSCOPY WITH PROPOFOL;  Surgeon: Lucilla Lame, MD;  Location: [facility];  Service: Endoscopy;  Laterality: N/A;  sleep apnea   CORONARY ANGIOPLASTY WITH STENT PLACEMENT  2008   DILATION AND CURETTAGE OF UTERUS     ESOPHAGOGASTRODUODENOSCOPY (EGD) WITH PROPOFOL N/A 03/09/2022   Procedure: ESOPHAGOGASTRODUODENOSCOPY (EGD) WITH PROPOFOL;  Surgeon: Lucilla Lame, MD;  Location: [facility];  Service: Endoscopy;  Laterality: N/A;   EYE SURGERY Bilateral    Cataract Extraction with IOL   FRACTURE SURGERY     HAND SURGERY Left 12/2017   upper hand fracture repair   LEFT HEART CATH AND CORONARY ANGIOGRAPHY N/A 06/29/2017   Procedure: LEFT HEART CATH AND CORONARY ANGIOGRAPHY;  Surgeon: Dionisio David, MD;  Location: [facility];  Service: Cardiovascular;  Laterality: N/A;   LEFT HEART CATH AND CORONARY ANGIOGRAPHY Left 12/02/2018   Procedure: LEFT HEART CATH AND CORONARY ANGIOGRAPHY;  Surgeon: Dionisio David, MD;  Location: [facility];  Service: Cardiovascular;  Laterality: Left;   OPEN REDUCTION INTERNAL FIXATION (ORIF) DISTAL RADIAL FRACTURE Right 08/12/2018   Procedure: OPEN REDUCTION INTERNAL FIXATION (ORIF) DISTAL RADIAL  FRACTURE;  Surgeon: [name], MD;  Location: [facility];  Service: Orthopedics;  Laterality: Right;   POLYPECTOMY  11/20/2016   Procedure: POLYPECTOMY;  Surgeon: Lucilla Lame, MD;  Location: [facility];  Service: Endoscopy;;   TUBAL LIGATION     VAGINAL HYSTERECTOMY     VULVECTOMY Right 01/31/2016   Procedure: WIDE EXCISION VULVECTOMY-RIGHT LABIA;  Surgeon: Brayton Mars, MD;  Location: [facility];  Service: Gynecology;  Laterality: Right;    SOCIAL HISTORY: Social History   Socioeconomic History   Marital status: Single    Spouse name: Not on file   Number of children: 5   Years of education: Not on file   Highest education level: Not on file  Occupational History   Occupation: part time job    Comment: Engineer, building services  Tobacco Use   Smoking status: Every Day    Packs/day: 1.00    Years: 35.00    Total pack years: 35.00    Types: Cigarettes   Smokeless tobacco: Current    Types: Snuff  Vaping Use   Vaping Use: Never used  Substance and Sexual Activity   Alcohol use: No    Alcohol/week: 0.0 standard drinks of alcohol   Drug use: No   Sexual activity: Not Currently    Partners: Male    Birth control/protection: Surgical  Other Topics Concern   Not on file  Social History Narrative   Not on file   Social Determinants of [name]   Financial Resource Strain: Low Risk  (12/02/2018)   Overall Financial Resource Strain (CARDIA)    Difficulty of Paying Living Expenses: Not very hard  Food Insecurity: No Food Insecurity (12/02/2018)   Hunger Vital Sign    Worried About Running Out of Food in the Last Year: Never true    Ran Out of Food in the Last Year: Never true  Transportation Needs: No Transportation Needs (12/02/2018)   PRAPARE - Hydrologist (Medical): No    Lack of Transportation (Non-Medical): No  Physical Activity: Inactive (12/18/2017)   Exercise Vital Sign    Days of Exercise per Week: 0 days    Minutes of Exercise per  Session: 0 min  Stress: No Stress Concern Present (12/02/2018)   [place]    Feeling of Stress : Only a little  Social Connections: Unknown (12/02/2018)   Social Connection and Isolation Panel [NHANES]    Frequency of Communication with Friends and Family: More than three times a week    Frequency of Social Gatherings with Friends and Family: Not on file    Attends Religious Services: Not on file    Active Member of Clubs or Organizations: Not on file    Attends Archivist Meetings: Not on file    Marital Status: Not on file  Intimate Partner Violence: Not At Risk (12/02/2018)   Humiliation, Afraid, Rape, and Kick questionnaire    Fear of Current or Ex-Partner: No    Emotionally Abused: No    Physically Abused: No    Sexually Abused: No    FAMILY HISTORY: Family History  Problem Relation Age of Onset   Heart disease Father    Stroke Mother    Hypertension Mother    Lung cancer Brother    Heart attack Brother    Congestive Heart Failure Sister    Cervical cancer Sister    Cancer Neg Hx    Diabetes Neg Hx    Breast cancer Neg Hx     ALLERGIES:  is allergic to requip [ropinirole hcl], celebrex [celecoxib], meloxicam, and nitrofurantoin.  MEDICATIONS:  Current Outpatient Medications  Medication Sig Dispense Refill   acetaminophen (TYLENOL) 500 MG tablet Take 500-1,000 mg by mouth every 6 (six) hours as needed for moderate pain or fever.     albuterol (VENTOLIN HFA) 108 (90 Base) MCG/ACT inhaler Inhale 1-2 puffs into the lungs every 6 (six) hours as needed for wheezing or shortness of breath.     amLODipine (NORVASC) 5 MG tablet Take 5 mg by mouth daily. 1 Tablet(s) By Mouth Daily     aspirin EC 81 MG tablet Take 1 tablet (81 mg total) by mouth daily. 30 tablet    atorvastatin (LIPITOR) 40 MG tablet Take 1 tablet (40 mg total) by mouth daily. 30 tablet 2   buPROPion (WELLBUTRIN XL) 300 MG 24 hr tablet  Take 300 mg by mouth daily.     isosorbide mononitrate (IMDUR) 120 MG 24 hr tablet Take 120 mg by mouth  daily.     lisinopril-hydrochlorothiazide (ZESTORETIC) 20-25 MG tablet Take 1 tablet by mouth daily.     metoprolol succinate (TOPROL-XL) 100 MG 24 hr tablet Take 100 mg by mouth daily.     nitroGLYCERIN (NITROSTAT) 0.4 MG SL tablet Place under the tongue.     ondansetron (ZOFRAN) 4 MG tablet Take 1 tablet (4 mg total) by mouth every 8 (eight) hours as needed for nausea or vomiting. 60 tablet 1   pantoprazole (PROTONIX) 40 MG tablet Take 1 tablet (40 mg total) by mouth 2 (two) times daily. 60 tablet 0   PREMARIN vaginal cream USE 1 GRAM INTRAVAGINALLY TWICE PER WEEK 30 g 3   zanubrutinib (BRUKINSA) 80 MG capsule Take 2 capsules (160 mg total) by mouth 2 (two) times daily. 120 capsule 1   acetaminophen-codeine (TYLENOL #3) 300-30 MG tablet Take 1 tablet by mouth every 12 (twelve) hours as needed for moderate pain or severe pain. (Patient not taking: Reported on 03/16/2022) 30 tablet 0   furosemide (LASIX) 20 MG tablet Take 20 mg by mouth daily. (Patient not taking: Reported on 03/16/2022)     lisinopril (ZESTRIL) 20 MG tablet Take 20 mg by mouth daily. (Patient not taking: Reported on 06/28/2022)     lisinopril (ZESTRIL) 40 MG tablet Hold until followup with outpatient provider due to acute kidney injury. (Patient not taking: Reported on 06/28/2022)     rivaroxaban (XARELTO) 2.5 MG TABS tablet Take 1 tablet (2.5 mg total) by mouth 2 (two) times daily. (Patient not taking: Reported on 08/02/2022) 60 tablet 0   traZODone (DESYREL) 50 MG tablet Take 50 mg by mouth at bedtime. (Patient not taking: Reported on 06/28/2022)     No current facility-administered medications for this visit.     PHYSICAL EXAMINATION: ECOG PERFORMANCE STATUS: 1 - Symptomatic but completely ambulatory Vitals:   08/02/22 1523  BP: (!) 205/87  Pulse: 79  Temp: (!) 96.2 F (35.7 C)  SpO2: 98%   Filed Weights   08/02/22  1523  Weight: 146 lb 14.4 oz (66.6 kg)    Physical Exam Constitutional:      General: She is not in acute distress.    Appearance: She is not diaphoretic.  HENT:     Head: Normocephalic and atraumatic.     Nose: Nose normal.     Mouth/Throat:     Pharynx: No oropharyngeal exudate.  Eyes:     General: No scleral icterus.       Left eye: No discharge.     Conjunctiva/sclera: Conjunctivae normal.     Pupils: Pupils are equal, round, and reactive to light.  Neck:     Vascular: No JVD.  Cardiovascular:     Rate and Rhythm: Normal rate and regular rhythm.     Heart sounds: No murmur heard. Pulmonary:     Effort: Pulmonary effort is normal. No respiratory distress.     Breath sounds: No rales.  Chest:     Chest wall: No tenderness.  Abdominal:     General: Bowel sounds are normal. There is no distension.     Palpations: Abdomen is soft.     Tenderness: There is no abdominal tenderness.  Musculoskeletal:        General: Normal range of motion.     Cervical back: Normal range of motion and neck supple.  Lymphadenopathy:     Cervical: No cervical adenopathy.  Skin:    General: Skin is warm and dry.     Findings: No erythema.  Neurological:     General: No focal deficit present.     Mental Status: She is alert and oriented to person, place, and time. Mental status is at baseline.     Cranial Nerves: No cranial nerve deficit.     Motor: No abnormal muscle tone.     Coordination: Coordination normal.  Psychiatric:        Mood and Affect: Affect normal.        Judgment: Judgment normal.      LABORATORY DATA:  I have reviewed the data as listed    Latest Ref Rng & Units 08/02/2022    3:00 PM 06/28/2022   10:01 AM 03/16/2022   10:58 AM  CBC  WBC 4.0 - 10.5 K/uL 36.2  41.3  40.2   Hemoglobin 12.0 - 15.0 g/dL 12.0  13.1  11.6   Hematocrit 36.0 - 46.0 % 36.9  40.1  36.7   Platelets 150 - 400 K/uL 243  215  187       Latest Ref Rng & Units 08/02/2022    3:00 PM 06/28/2022    10:01 AM 03/16/2022   10:58 AM  CMP  Glucose 70 - 99 mg/dL 92  96  117   BUN 8 - 23 mg/dL 12  17  10    Creatinine 0.44 - 1.00 mg/dL 1.11  1.22  1.16   Sodium 135 - 145 mmol/L 138  129  136   Potassium 3.5 - 5.1 mmol/L 3.9  3.9  3.8   Chloride 98 - 111 mmol/L 103  93  105   CO2 22 - 32 mmol/L 27  28  26    Calcium 8.9 - 10.3 mg/dL 9.7  9.8  9.1   Total Protein 6.5 - 8.1 g/dL 6.9  7.2  6.6   Total Bilirubin 0.3 - 1.2 mg/dL 0.2  0.8  0.2   Alkaline Phos 38 - 126 U/L 60  78  64   AST 15 - 41 U/L 14  14  12    ALT 0 - 44 U/L 11  15  9

## 2022-08-02 NOTE — Assessment & Plan Note (Signed)
Due to medication non compliance She declines ER evaluation. She plans to take BP med at home.

## 2022-08-03 ENCOUNTER — Other Ambulatory Visit (HOSPITAL_COMMUNITY): Payer: Self-pay

## 2022-08-07 ENCOUNTER — Other Ambulatory Visit (HOSPITAL_COMMUNITY): Payer: Self-pay

## 2022-08-09 ENCOUNTER — Other Ambulatory Visit (HOSPITAL_COMMUNITY): Payer: Self-pay

## 2022-08-21 ENCOUNTER — Telehealth: Payer: Self-pay | Admitting: Oncology

## 2022-08-21 NOTE — Telephone Encounter (Signed)
Notified pt of cancellation of PET Scan .Marland Kitchen Due to Auth not being recieved .Marland KitchenKJ

## 2022-08-22 ENCOUNTER — Other Ambulatory Visit (HOSPITAL_COMMUNITY): Payer: Self-pay

## 2022-08-22 ENCOUNTER — Other Ambulatory Visit: Payer: Medicare Other

## 2022-08-23 ENCOUNTER — Ambulatory Visit: Payer: 59

## 2022-08-23 ENCOUNTER — Inpatient Hospital Stay: Payer: 59

## 2022-08-23 NOTE — Progress Notes (Signed)
Nutrition  Patient did not show up for scheduled nutrition visit today.  Message sent to scheduling to offer another appointment and provider notified.    Drianna Chandran B. Zenia Resides, [place], [place] Registered Dietitian [phone]

## 2022-08-29 ENCOUNTER — Other Ambulatory Visit: Payer: Self-pay

## 2022-08-29 ENCOUNTER — Telehealth: Payer: Self-pay

## 2022-08-29 NOTE — Telephone Encounter (Signed)
Please schedule MD only - a few days after PET (at least 2-3 days) and inform pt of appt. Pt also has appt with Joli, can you r/s that to the day that she comes see Dr. Tasia Catchings please.

## 2022-08-30 ENCOUNTER — Other Ambulatory Visit: Payer: Self-pay | Admitting: Internal Medicine

## 2022-08-30 DIAGNOSIS — M8589 Other specified disorders of bone density and structure, multiple sites: Secondary | ICD-10-CM

## 2022-08-31 ENCOUNTER — Ambulatory Visit: Admission: RE | Admit: 2022-08-31 | Payer: 59 | Source: Ambulatory Visit

## 2022-08-31 ENCOUNTER — Telehealth: Payer: Self-pay | Admitting: Oncology

## 2022-08-31 NOTE — Telephone Encounter (Signed)
Per [place] req to r/s appts. Mail remiinder sent

## 2022-09-01 ENCOUNTER — Inpatient Hospital Stay: Payer: 59

## 2022-09-06 ENCOUNTER — Ambulatory Visit: Payer: 59 | Admitting: Oncology

## 2022-09-11 ENCOUNTER — Ambulatory Visit
Admission: RE | Admit: 2022-09-11 | Discharge: 2022-09-11 | Disposition: A | Discharge status: Home / Self Care | Payer: 59 | Source: Ambulatory Visit | Attending: Oncology | Admitting: Oncology

## 2022-09-11 DIAGNOSIS — I700 Atherosclerosis of aorta: Secondary | ICD-10-CM | POA: Insufficient documentation

## 2022-09-11 DIAGNOSIS — I2510 Atherosclerotic heart disease of native coronary artery without angina pectoris: Secondary | ICD-10-CM | POA: Insufficient documentation

## 2022-09-11 DIAGNOSIS — C9110 Chronic lymphocytic leukemia of B-cell type not having achieved remission: Secondary | ICD-10-CM | POA: Insufficient documentation

## 2022-09-11 DIAGNOSIS — I6521 Occlusion and stenosis of right carotid artery: Secondary | ICD-10-CM | POA: Diagnosis not present

## 2022-09-11 DIAGNOSIS — J984 Other disorders of lung: Secondary | ICD-10-CM | POA: Diagnosis not present

## 2022-09-11 LAB — GLUCOSE, CAPILLARY: Glucose-Capillary: 90 mg/dL (ref 70–99)

## 2022-09-11 MED ORDER — FLUDEOXYGLUCOSE F - 18 (FDG) INJECTION
7.6000 | Freq: Once | INTRAVENOUS | Status: AC
  Administered 2022-09-11: 8.13 via INTRAVENOUS

## 2022-09-12 ENCOUNTER — Other Ambulatory Visit (HOSPITAL_COMMUNITY): Payer: Self-pay

## 2022-09-13 ENCOUNTER — Other Ambulatory Visit (HOSPITAL_COMMUNITY): Payer: Self-pay

## 2022-09-19 ENCOUNTER — Other Ambulatory Visit (HOSPITAL_COMMUNITY): Payer: Self-pay

## 2022-09-19 ENCOUNTER — Encounter: Payer: Self-pay | Admitting: Oncology

## 2022-09-19 ENCOUNTER — Inpatient Hospital Stay: Payer: 59 | Attending: Oncology

## 2022-09-19 ENCOUNTER — Inpatient Hospital Stay (HOSPITAL_BASED_OUTPATIENT_CLINIC_OR_DEPARTMENT_OTHER): Payer: 59 | Admitting: Oncology

## 2022-09-19 VITALS — BP 173/75 | HR 57 | Temp 96.1°F | Resp 16 | Wt 153.4 lb

## 2022-09-19 DIAGNOSIS — I129 Hypertensive chronic kidney disease with stage 1 through stage 4 chronic kidney disease, or unspecified chronic kidney disease: Secondary | ICD-10-CM | POA: Diagnosis not present

## 2022-09-19 DIAGNOSIS — R1900 Intra-abdominal and pelvic swelling, mass and lump, unspecified site: Secondary | ICD-10-CM | POA: Diagnosis not present

## 2022-09-19 DIAGNOSIS — N183 Chronic kidney disease, stage 3 unspecified: Secondary | ICD-10-CM | POA: Diagnosis not present

## 2022-09-19 DIAGNOSIS — C9110 Chronic lymphocytic leukemia of B-cell type not having achieved remission: Secondary | ICD-10-CM

## 2022-09-19 DIAGNOSIS — F17210 Nicotine dependence, cigarettes, uncomplicated: Secondary | ICD-10-CM | POA: Insufficient documentation

## 2022-09-19 DIAGNOSIS — K209 Esophagitis, unspecified without bleeding: Secondary | ICD-10-CM

## 2022-09-19 DIAGNOSIS — N1831 Chronic kidney disease, stage 3a: Secondary | ICD-10-CM | POA: Diagnosis not present

## 2022-09-19 NOTE — Assessment & Plan Note (Signed)
Encourage oral hydration.  Avoid nephrotoxins.

## 2022-09-19 NOTE — Progress Notes (Signed)
Hematology/Oncology Progress note [phone] [phone]        REASON FOR VISIT Follow up for treatment of CLL  ASSESSMENT & PLAN:   Cancer Staging  CLL (chronic lymphocytic leukemia) ([place]) Staging form: Chronic Lymphocytic Leukemia / Small Lymphocytic Lymphoma, AJCC 8th Edition - Clinical: Modified Rai Stage I (Modified Rai risk: Intermediate, Lymphocytosis: Present, Adenopathy: Present, Organomegaly: Absent, Anemia: Absent, Thrombocytopenia: Absent) - Signed by Earlie Server, MD on 12/06/2021   CLL (chronic lymphocytic leukemia) ([place]) CLL  IgVH Somatic Hypermutation was not detected, 13Q del,  Progressive intra-abdominal lymphadenopathy, She is not complaint with her medication.  Repeat PET scan findings were reviewed with patient.  Slightly increase of abdominal lymphadenopathy.  Previously I have advised patient to resume Zanubrutinib  and she was not compliant. Given that she is fairly asymptomatic at this point, recommend close surveillance with repeat CT scan in 4 months.  She agrees with the plan.    CKD (chronic kidney disease) stage 3, GFR 30-59 ml/min (HCC) Encourage oral hydration.  Avoid nephrotoxins.  Esophagitis Continue PPI She reports improvement of symptoms.  Pelvic mass Recent images indicate ovarian origin. Normal CA 125 and elevated HE4.  Patient was seen by gynecology oncology Dr. Fransisca Connors.  Given that the mass has been stable for more than 6 years, Dr. Fransisca Connors recommends observation. Mass is smaller on recent PET scan.  Orders Placed This Encounter  Procedures   CT Abdomen Pelvis Wo Contrast    To be scheduled in approx 4 months - a few days prior to seeing Dr. Tasia Catchings    Standing Status:   Future    Standing Expiration Date:   09/19/2023    Order Specific Question:   Preferred imaging location?    Answer:   [facility]    Order Specific Question:   Is Oral Contrast requested for this exam?    Answer:   Yes, Per Radiology  protocol    Order Specific Question:   Does the patient have a contrast media/X-ray dye allergy?    Answer:   No   CBC with Differential (Cancer Center Only)    Standing Status:   Future    Standing Expiration Date:   09/20/2023   CMP ([place] only)    Standing Status:   Future    Standing Expiration Date:   09/20/2023   Lactate dehydrogenase    Standing Status:   Future    Standing Expiration Date:   09/20/2023    Follow up in 3 months.   Earlie Server, MD, PhD [facility] Hematology Oncology 09/19/2022       HISTORY OF PRESENTING ILLNESS:  Karen [name] is a  77 y.o.  female with PMH listed below who presents for follow-up of CLL treatments. Oncology history summary listed as below. Oncology History  CLL (chronic lymphocytic leukemia) ([place])  09/24/2015 Imaging   10/14/2015 chest without contrast Images were independently reviewed by me Small pulmonary nodules are stable over the past year.  Considered benign.  Several lymph nodes are borderline enlarged, including the lower right paratracheal lymph node and a portacaval lymph node. 08/27/2017 US abdomen complete, no hepato-splenomegaly.   12/18/2017 Initial Diagnosis   CLL (chronic lymphocytic leukemia)   07/30/2017, peripheral blood flow cytometry showed CD5+, CD23+, CD38 negative monoclonal B-cell population consistent with a diagnosis of CLL   06/27/2019 Cancer Staging   Staging form: Chronic Lymphocytic Leukemia / Small Lymphocytic Lymphoma, AJCC 8th Edition - Clinical: Modified Rai Stage I (Modified Rai risk: Intermediate,  Lymphocytosis: Present, Adenopathy: Present, Organomegaly: Absent, Anemia: Absent, Thrombocytopenia: Absent) - Signed by Earlie Server, MD on 12/06/2021   11/27/2021 Imaging   Outside facility CT Aorta and run off w wo  showed moderate atherosclerotic aortic disease.  Diffuse lightest of moderate disease of the bilateral [place] vasculature.Diffuse mild stenotic disease of the bilateral SFAs. In addition, patient  has soft tissue mass/lymph node along the left pelvic sidewall measures 4.9 x 4.7 cm, numerous peritoneum subcu tissue nodule, a deep right pelvic nodule and numerous enlarged mesenteric and retroperitoneal lymph nodes are identified.  For example a right mesenteric lymph node measures 2.4 x 2.2 cm.  The left periaortic lymph node measures 4.4 x 2.0 cm.  A pericardial lymph node measures 2.6 x 1.3 cm.   01/03/2022 Pathology Results   Right abdomen lymph node biopsy showed chronic lymphocytic leukemia/small lymphocytic lymphoma    01/09/2022 - 02/06/2022 Chemotherapy   Acalabrutinib 100 mg twice daily  Stopped due to sensation of throat closing.   03/06/2022 -  Chemotherapy   Zanubrutinib, not able to swallow due to esophagitis   03/09/2022 - 03/12/2022 Hospital Admission   Patient reports that she took 1 day of zanubrutinib around 03/06/2022.  She has had difficulty swallowing the pills. She went to emergency room on 03/09/2022 for evaluation of dysphagia symptoms.  Symptoms occurs with both liquids and solids.  Also midsternal chest pain.  She was found to have hypertension emergency with systolic blood pressure greater than 200.  Patient states that she is being compliant with her blood pressure pills. Patient was treated with hydralazine, Cardizem drip.  Blood pressure improves. During the admission, she has had a EGD done by Dr. Allen Norris which showed esophagitis.  No biopsy was taken due to patient being on anticoagulation (aspirin, Plavix, low-dose Xarelto 2.5 mg twice daily] plan is for patient to follow-up with Dr. Alice Reichert outpatient for repeat EGD while off anticoagulation.  Her PPI dose was increased. She was discharged on metoprolol, lisinopril, amlodipine and nitrates   03/09/2022 Imaging   CT angio chest/abdomen/pelvis for dissection 1. No evidence for thoracic or abdominal aortic aneurysm or dissection. 2. Lymphadenopathy is identified in the chest, abdomen, and pelvis in this patient with  reported history of CLL. Mediastinal lymphadenopathy is similar to prior chest CT of 2017. A hepatoduodenal ligament lymph node included on that prior chest CT has increased in size in the interval. Additional abdominopelvic lymphadenopathy was not included on available prior imaging. Close follow-up recommended and comparison to old studies suggested to assess for progression. 3. Left pelvic soft tissue mass measures 5.5 x 4.4 cm. Left gonadal veins appear to arise directly from this lesion suggesting ovarian etiology. Pelvic ultrasound may prove helpful to further evaluate. 4. Bilateral renal lesions with attenuation too high to be simple cysts. These may be cysts complicated by proteinaceous debris or hemorrhage, but MRI abdomen with and without contrast recommended to confirm. 5. Diffuse colonic diverticulosis without diverticulitis.6. Stable appearance of small right-sided pulmonary nodules,consistent with benign etiology.7. Aortic Atherosclerosis (ICD10-I70.0).   03/09/2022 Imaging   Pelvic ultrasound showed 1. 5.1 cm solid soft tissue mass in the left adnexa likely representing left ovarian origin. Given that this patient has a reported history of CLL, there is likely prior imaging available outside are system. Consider comparison to previous outside studies to assess chronicity of this finding.2. No evidence for ovarian torsion.3. Status post hysterectomy.     INTERVAL HISTORY Karen [name] is a 77 y.o. female who has above history reviewed  by me today presents for follow up visit for CLL 03/09/22 EGD showed Non-severe non-erosive esophagitis with no bleeding She take PPI. She does not take her BP medication routinely.  Currently off Zanubrutinib.  Denies any new concerns.   Review of Systems  Constitutional:  Positive for fatigue. Negative for appetite change, chills, fever and unexpected weight change.  HENT:   Negative for hearing loss and voice change.   Eyes:  Negative for eye  problems.  Respiratory:  Negative for chest tightness and cough.   Cardiovascular:  Negative for chest pain.  Gastrointestinal:  Negative for abdominal distention, abdominal pain, blood in stool and nausea.  Endocrine: Negative for hot flashes.  Genitourinary:  Negative for difficulty urinating and frequency.   Musculoskeletal:  Positive for back pain. Negative for arthralgias.  Skin:  Negative for itching and rash.  Neurological:  Negative for extremity weakness and headaches.  Hematological:  Negative for adenopathy.  Psychiatric/Behavioral:  Negative for confusion.      MEDICAL HISTORY:  Past Medical History:  Diagnosis Date   Anxiety    Artery occlusion    Asthma    Back pain    Bronchitis    Cancer (HCC)    Skin Cancer   CLL (chronic lymphocytic leukemia) (HCC)    Coronary artery disease    Depression    Dyspnea    GERD (gastroesophageal reflux disease)    Herpes    History of kidney stones    Hypercholesteremia    Hyperlipemia    Hypertension    Hypothyroidism    Myocardial infarction ([place]) 1999   Neuropathy    Restless leg syndrome    Sleep apnea    use C-PAP   Wears dentures    full upper, partial lower.  doesn't currently wear    SURGICAL HISTORY: Past Surgical History:  Procedure Laterality Date   COLONOSCOPY WITH PROPOFOL N/A 11/20/2016   Procedure: COLONOSCOPY WITH PROPOFOL;  Surgeon: Lucilla Lame, MD;  Location: [facility];  Service: Endoscopy;  Laterality: N/A;  sleep apnea   CORONARY ANGIOPLASTY WITH STENT PLACEMENT  2008   DILATION AND CURETTAGE OF UTERUS     ESOPHAGOGASTRODUODENOSCOPY (EGD) WITH PROPOFOL N/A 03/09/2022   Procedure: ESOPHAGOGASTRODUODENOSCOPY (EGD) WITH PROPOFOL;  Surgeon: Lucilla Lame, MD;  Location: [facility];  Service: Endoscopy;  Laterality: N/A;   EYE SURGERY Bilateral    Cataract Extraction with IOL   FRACTURE SURGERY     HAND SURGERY Left 12/2017   upper hand fracture repair   LEFT HEART CATH AND CORONARY  ANGIOGRAPHY N/A 06/29/2017   Procedure: LEFT HEART CATH AND CORONARY ANGIOGRAPHY;  Surgeon: Dionisio David, MD;  Location: [facility];  Service: Cardiovascular;  Laterality: N/A;   LEFT HEART CATH AND CORONARY ANGIOGRAPHY Left 12/02/2018   Procedure: LEFT HEART CATH AND CORONARY ANGIOGRAPHY;  Surgeon: Dionisio David, MD;  Location: [place] [facility];  Service: Cardiovascular;  Laterality: Left;   OPEN REDUCTION INTERNAL FIXATION (ORIF) DISTAL RADIAL FRACTURE Right 08/12/2018   Procedure: OPEN REDUCTION INTERNAL FIXATION (ORIF) DISTAL RADIAL FRACTURE;  Surgeon: [name], MD;  Location: [facility];  Service: Orthopedics;  Laterality: Right;   POLYPECTOMY  11/20/2016   Procedure: POLYPECTOMY;  Surgeon: Lucilla Lame, MD;  Location: [facility];  Service: Endoscopy;;   TUBAL LIGATION     VAGINAL HYSTERECTOMY     VULVECTOMY Right 01/31/2016   Procedure: WIDE EXCISION VULVECTOMY-RIGHT LABIA;  Surgeon: Brayton Mars, MD;  Location: [facility];  Service:  Gynecology;  Laterality: Right;    SOCIAL HISTORY: Social History   Socioeconomic History   Marital status: Single    Spouse name: Not on file   Number of children: 5   Years of education: Not on file   Highest education level: Not on file  Occupational History   Occupation: part time job    Comment: [employer]  Tobacco Use   Smoking status: Every Day    Packs/day: 1.00    Years: 35.00    Total pack years: 35.00    Types: Cigarettes   Smokeless tobacco: Current    Types: Snuff  Vaping Use   Vaping Use: Never used  Substance and Sexual Activity   Alcohol use: No    Alcohol/week: 0.0 standard drinks of alcohol   Drug use: No   Sexual activity: Not Currently    Partners: Male    Birth control/protection: Surgical  Other Topics Concern   Not on file  Social History Narrative   Not on file   Social Determinants of Health   Financial Resource Strain: Low Risk  (12/02/2018)   Overall Financial  Resource Strain (CARDIA)    Difficulty of Paying Living Expenses: Not very hard  Food Insecurity: No Food Insecurity (12/02/2018)   Hunger Vital Sign    Worried About Running Out of Food in the Last Year: Never true    Ran Out of Food in the Last Year: Never true  Transportation Needs: No Transportation Needs (12/02/2018)   PRAPARE - Hydrologist (Medical): No    Lack of Transportation (Non-Medical): No  Physical Activity: Inactive (12/18/2017)   Exercise Vital Sign    Days of Exercise per Week: 0 days    Minutes of Exercise per Session: 0 min  Stress: No Stress Concern Present (12/02/2018)   [place]    Feeling of Stress : Only a little  Social Connections: Unknown (12/02/2018)   Social Connection and Isolation Panel [NHANES]    Frequency of Communication with Friends and Family: More than three times a week    Frequency of Social Gatherings with Friends and Family: Not on file    Attends Religious Services: Not on file    Active Member of Clubs or Organizations: Not on file    Attends Archivist Meetings: Not on file    Marital Status: Not on file  Intimate Partner Violence: Not At Risk (12/02/2018)   Humiliation, Afraid, Rape, and Kick questionnaire    Fear of Current or Ex-Partner: No    Emotionally Abused: No    Physically Abused: No    Sexually Abused: No    FAMILY HISTORY: Family History  Problem Relation Age of Onset   Heart disease Father    Stroke Mother    Hypertension Mother    Lung cancer Brother    Heart attack Brother    Congestive Heart Failure Sister    Cervical cancer Sister    Cancer Neg Hx    Diabetes Neg Hx    Breast cancer Neg Hx     ALLERGIES:  is allergic to requip [ropinirole hcl], celebrex [celecoxib], meloxicam, and nitrofurantoin.  MEDICATIONS:  Current Outpatient Medications  Medication Sig Dispense Refill   acetaminophen (TYLENOL) 500  MG tablet Take 500-1,000 mg by mouth every 6 (six) hours as needed for moderate pain or fever.     albuterol (VENTOLIN HFA) 108 (90 Base) MCG/ACT inhaler Inhale 1-2 puffs into  the lungs every 6 (six) hours as needed for wheezing or shortness of breath.     amLODipine (NORVASC) 5 MG tablet Take 5 mg by mouth daily. 1 Tablet(s) By Mouth Daily     aspirin EC 81 MG tablet Take 1 tablet (81 mg total) by mouth daily. 30 tablet    atorvastatin (LIPITOR) 40 MG tablet Take 1 tablet (40 mg total) by mouth daily. 30 tablet 2   buPROPion (WELLBUTRIN XL) 300 MG 24 hr tablet Take 300 mg by mouth daily.     isosorbide mononitrate (IMDUR) 120 MG 24 hr tablet Take 120 mg by mouth daily.     lisinopril-hydrochlorothiazide (ZESTORETIC) 20-25 MG tablet Take 1 tablet by mouth daily.     metoprolol succinate (TOPROL-XL) 100 MG 24 hr tablet Take 100 mg by mouth daily.     nitroGLYCERIN (NITROSTAT) 0.4 MG SL tablet Place under the tongue.     ondansetron (ZOFRAN) 4 MG tablet Take 1 tablet (4 mg total) by mouth every 8 (eight) hours as needed for nausea or vomiting. 60 tablet 1   pantoprazole (PROTONIX) 40 MG tablet Take 1 tablet (40 mg total) by mouth 2 (two) times daily. 60 tablet 0   traZODone (DESYREL) 50 MG tablet Take 50 mg by mouth at bedtime.     acetaminophen-codeine (TYLENOL #3) 300-30 MG tablet Take 1 tablet by mouth every 12 (twelve) hours as needed for moderate pain or severe pain. (Patient not taking: Reported on 03/16/2022) 30 tablet 0   furosemide (LASIX) 20 MG tablet Take 20 mg by mouth daily. (Patient not taking: Reported on 03/16/2022)     lisinopril (ZESTRIL) 20 MG tablet Take 20 mg by mouth daily. (Patient not taking: Reported on 06/28/2022)     lisinopril (ZESTRIL) 40 MG tablet Hold until followup with outpatient provider due to acute kidney injury. (Patient not taking: Reported on 06/28/2022)     PREMARIN vaginal cream USE 1 GRAM INTRAVAGINALLY TWICE PER WEEK (Patient not taking: Reported on 09/19/2022) 30  g 3   rivaroxaban (XARELTO) 2.5 MG TABS tablet Take 1 tablet (2.5 mg total) by mouth 2 (two) times daily. (Patient not taking: Reported on 08/02/2022) 60 tablet 0   zanubrutinib (BRUKINSA) 80 MG capsule Take 2 capsules (160 mg total) by mouth 2 (two) times daily. (Patient not taking: Reported on 09/19/2022) 120 capsule 1   No current facility-administered medications for this visit.     PHYSICAL EXAMINATION: ECOG PERFORMANCE STATUS: 1 - Symptomatic but completely ambulatory Vitals:   09/19/22 1356  BP: (!) 173/75  Pulse: (!) 57  Resp: 16  Temp: (!) 96.1 F (35.6 C)  SpO2: 97%   Filed Weights   09/19/22 1356  Weight: 153 lb 6.4 oz (69.6 kg)    Physical Exam Constitutional:      General: She is not in acute distress.    Appearance: She is not diaphoretic.  HENT:     Head: Normocephalic and atraumatic.     Nose: Nose normal.     Mouth/Throat:     Pharynx: No oropharyngeal exudate.  Eyes:     General: No scleral icterus.       Left eye: No discharge.     Conjunctiva/sclera: Conjunctivae normal.     Pupils: Pupils are equal, round, and reactive to light.  Neck:     Vascular: No JVD.  Cardiovascular:     Rate and Rhythm: Normal rate and regular rhythm.     Heart sounds: No murmur heard. Pulmonary:  Effort: Pulmonary effort is normal. No respiratory distress.     Breath sounds: No rales.  Chest:     Chest wall: No tenderness.  Abdominal:     General: Bowel sounds are normal. There is no distension.     Palpations: Abdomen is soft.     Tenderness: There is no abdominal tenderness.  Musculoskeletal:        General: Normal range of motion.     Cervical back: Normal range of motion and neck supple.  Lymphadenopathy:     Cervical: No cervical adenopathy.  Skin:    General: Skin is warm and dry.     Findings: No erythema.  Neurological:     General: No focal deficit present.     Mental Status: She is alert and oriented to person, place, and time. Mental status is at  baseline.     Cranial Nerves: No cranial nerve deficit.     Motor: No abnormal muscle tone.     Coordination: Coordination normal.  Psychiatric:        Mood and Affect: Affect normal.        Judgment: Judgment normal.      LABORATORY DATA:  I have reviewed the data as listed    Latest Ref Rng & Units 08/02/2022    3:00 PM 06/28/2022   10:01 AM 03/16/2022   10:58 AM  CBC  WBC 4.0 - 10.5 K/uL 36.2  41.3  40.2   Hemoglobin 12.0 - 15.0 g/dL 12.0  13.1  11.6   Hematocrit 36.0 - 46.0 % 36.9  40.1  36.7   Platelets 150 - 400 K/uL 243  215  187       Latest Ref Rng & Units 08/02/2022    3:00 PM 06/28/2022   10:01 AM 03/16/2022   10:58 AM  CMP  Glucose 70 - 99 mg/dL 92  96  117   BUN 8 - 23 mg/dL '12  17  10   '$ Creatinine 0.44 - 1.00 mg/dL 1.11  1.22  1.16   Sodium 135 - 145 mmol/L 138  129  136   Potassium 3.5 - 5.1 mmol/L 3.9  3.9  3.8   Chloride 98 - 111 mmol/L 103  93  105   CO2 22 - 32 mmol/L '27  28  26   '$ Calcium 8.9 - 10.3 mg/dL 9.7  9.8  9.1   Total Protein 6.5 - 8.1 g/dL 6.9  7.2  6.6   Total Bilirubin 0.3 - 1.2 mg/dL 0.2  0.8  0.2   Alkaline Phos 38 - 126 U/L 60  78  64   AST 15 - 41 U/L '14  14  12   '$ ALT 0 - 44 U/L 11  15  9

## 2022-09-19 NOTE — Assessment & Plan Note (Addendum)
CLL  IgVH Somatic Hypermutation was not detected, 13Q del,  Progressive intra-abdominal lymphadenopathy, She is not complaint with her medication.  Repeat PET scan findings were reviewed with patient.  Slightly increase of abdominal lymphadenopathy.  Previously I have advised patient to resume Zanubrutinib  and she was not compliant. Given that she is fairly asymptomatic at this point, recommend close surveillance with repeat CT scan in 4 months.  She agrees with the plan.

## 2022-09-19 NOTE — Progress Notes (Signed)
Nutrition Assessment   Reason for Assessment:  Decreased appetite  ASSESSMENT:  77 year old female with CLL followed by Dr Tasia Catchings.  Past medical history of CKD stage 3, HTN, MI, CAD, GERD.  Patient not taking zanubrutinib.    Met with patient after MD visit.  Patient reports that her appetite is good/normal.  Back in January reports that appetite was decreased and she was having issues with nausea but this has resolved.  States that she typically has eggs, grits, sometimes toast and coffee for breakfast.  Lunch and dinner is usually a sandwich. Drinks soda and water.  Denies trouble chewing and swallowing foods.  Reports constipation.     Medications: zofran, protonix   Labs: reviewed   Anthropometrics:   Height: 62 inches Weight: 153 lb 6.4 oz today 146 lb 14.4 oz on 1/10 149 lb 11.2 oz on 12/6 145 lb 3.2 oz on 10/6  BMI: 26  Weight increased today   NUTRITION DIAGNOSIS: none at this time   INTERVENTION:  Discussed strategies to help with constipation Encouraged well balanced diet including lean protein.   Contact information provided   MONITORING, EVALUATION, GOAL: weight trends, intake   Next Visit: phone call Tuesday, March 26   Nimra Puccinelli B. Zenia Resides, [place], [place] Registered Dietitian [phone]

## 2022-09-19 NOTE — Assessment & Plan Note (Signed)
Continue PPI She reports improvement of symptoms.

## 2022-09-19 NOTE — Assessment & Plan Note (Addendum)
Recent images indicate ovarian origin. Normal CA 125 and elevated HE4.  Patient was seen by gynecology oncology Dr. Fransisca Connors.  Given that the mass has been stable for more than 6 years, Dr. Fransisca Connors recommends observation. Mass is smaller on recent PET scan.

## 2022-09-25 ENCOUNTER — Ambulatory Visit (INDEPENDENT_AMBULATORY_CARE_PROVIDER_SITE_OTHER): Payer: 59 | Admitting: Internal Medicine

## 2022-09-25 ENCOUNTER — Encounter: Payer: Self-pay | Admitting: Internal Medicine

## 2022-09-25 VITALS — BP 120/68 | HR 56 | Ht 63.0 in | Wt 151.4 lb

## 2022-09-25 DIAGNOSIS — N3946 Mixed incontinence: Secondary | ICD-10-CM

## 2022-09-25 DIAGNOSIS — Z87440 Personal history of urinary (tract) infections: Secondary | ICD-10-CM | POA: Diagnosis not present

## 2022-09-25 DIAGNOSIS — I10 Essential (primary) hypertension: Secondary | ICD-10-CM | POA: Diagnosis not present

## 2022-09-25 DIAGNOSIS — I480 Paroxysmal atrial fibrillation: Secondary | ICD-10-CM

## 2022-09-25 DIAGNOSIS — E782 Mixed hyperlipidemia: Secondary | ICD-10-CM

## 2022-09-25 DIAGNOSIS — I25118 Atherosclerotic heart disease of native coronary artery with other forms of angina pectoris: Secondary | ICD-10-CM | POA: Diagnosis not present

## 2022-09-25 DIAGNOSIS — C9110 Chronic lymphocytic leukemia of B-cell type not having achieved remission: Secondary | ICD-10-CM | POA: Diagnosis not present

## 2022-09-25 DIAGNOSIS — F17210 Nicotine dependence, cigarettes, uncomplicated: Secondary | ICD-10-CM

## 2022-09-25 DIAGNOSIS — G4733 Obstructive sleep apnea (adult) (pediatric): Secondary | ICD-10-CM

## 2022-09-25 LAB — POCT URINALYSIS DIPSTICK
Bilirubin, UA: NEGATIVE
Blood, UA: NEGATIVE
Glucose, UA: NEGATIVE
Ketones, UA: NEGATIVE
Nitrite, UA: POSITIVE
Protein, UA: NEGATIVE
Spec Grav, UA: 1.02 (ref 1.010–1.025)
Urobilinogen, UA: 0.2 E.U./dL
pH, UA: 5.5 (ref 5.0–8.0)

## 2022-09-25 MED ORDER — OXYBUTYNIN CHLORIDE ER 10 MG PO TB24: 10.0000 mg | ORAL_TABLET | Freq: Every day | ORAL | 2 refills | Status: DC

## 2022-09-25 NOTE — Progress Notes (Signed)
Established Patient Office Visit  Subjective:  Patient ID: Karen [name], female    DOB: 09/11/1945  Age: 77 y.o. MRN: [id number]  Chief Complaint  Patient presents with   Follow-up    Follow up    Patient comes in for her follow-up today.  She feels generally stable but complains of some fatigue.  She reports of multiple episodes of urinary incontinence especially at night.  She denies any dysuria or burning micturition.  Her urine dipstick done today in office is also negative. She is under care of hematology  for her CLL.     Past Medical History:  Diagnosis Date   Anxiety    Artery occlusion    Asthma    Back pain    Bronchitis    Cancer (HCC)    Skin Cancer   CLL (chronic lymphocytic leukemia) (HCC)    Coronary artery disease    Depression    Dyspnea    GERD (gastroesophageal reflux disease)    Herpes    History of kidney stones    Hypercholesteremia    Hyperlipemia    Hypertension    Hypothyroidism    Myocardial infarction ([place]) 1999   Neuropathy    Restless leg syndrome    Sleep apnea    use C-PAP   Wears dentures    full upper, partial lower.  doesn't currently wear    Social History   Socioeconomic History   Marital status: Single    Spouse name: Not on file   Number of children: 5   Years of education: Not on file   Highest education level: Not on file  Occupational History   Occupation: part time job    Comment: [employer]  Tobacco Use   Smoking status: Every Day    Packs/day: 1.00    Years: 35.00    Total pack years: 35.00    Types: Cigarettes   Smokeless tobacco: Current    Types: Snuff  Vaping Use   Vaping Use: Never used  Substance and Sexual Activity   Alcohol use: No    Alcohol/week: 0.0 standard drinks of alcohol   Drug use: No   Sexual activity: Not Currently    Partners: Male    Birth control/protection: Surgical  Other Topics Concern   Not on file  Social History Narrative   Not on file   Social Determinants of  Health   Financial Resource Strain: Low Risk  (12/02/2018)   Overall Financial Resource Strain (CARDIA)    Difficulty of Paying Living Expenses: Not very hard  Food Insecurity: No Food Insecurity (12/02/2018)   Hunger Vital Sign    Worried About Running Out of Food in the Last Year: Never true    Ran Out of Food in the Last Year: Never true  Transportation Needs: No Transportation Needs (12/02/2018)   PRAPARE - Hydrologist (Medical): No    Lack of Transportation (Non-Medical): No  Physical Activity: Inactive (12/18/2017)   Exercise Vital Sign    Days of Exercise per Week: 0 days    Minutes of Exercise per Session: 0 min  Stress: No Stress Concern Present (12/02/2018)   [place]    Feeling of Stress : Only a little  Social Connections: Unknown (12/02/2018)   Social Connection and Isolation Panel [NHANES]    Frequency of Communication with Friends and Family: More than three times a week    Frequency of Social  Gatherings with Friends and Family: Not on file    Attends Religious Services: Not on file    Active Member of Clubs or Organizations: Not on file    Attends Club or Organization Meetings: Not on file    Marital Status: Not on file  Intimate Partner Violence: Not At Risk (12/02/2018)   Humiliation, Afraid, Rape, and Kick questionnaire    Fear of Current or Ex-Partner: No    Emotionally Abused: No    Physically Abused: No    Sexually Abused: No   Past Surgical History:  Procedure Laterality Date   COLONOSCOPY WITH PROPOFOL N/A 11/20/2016   Procedure: COLONOSCOPY WITH PROPOFOL;  Surgeon: Lucilla Lame, MD;  Location: [place];  Service: Endoscopy;  Laterality: N/A;  sleep apnea   CORONARY ANGIOPLASTY WITH STENT PLACEMENT  2008   DILATION AND CURETTAGE OF UTERUS     ESOPHAGOGASTRODUODENOSCOPY (EGD) WITH PROPOFOL N/A 03/09/2022   Procedure: ESOPHAGOGASTRODUODENOSCOPY (EGD) WITH  PROPOFOL;  Surgeon: Lucilla Lame, MD;  Location: [facility];  Service: Endoscopy;  Laterality: N/A;   EYE SURGERY Bilateral    Cataract Extraction with IOL   FRACTURE SURGERY     HAND SURGERY Left 12/2017   upper hand fracture repair   LEFT HEART CATH AND CORONARY ANGIOGRAPHY N/A 06/29/2017   Procedure: LEFT HEART CATH AND CORONARY ANGIOGRAPHY;  Surgeon: Dionisio David, MD;  Location: [place] [facility];  Service: Cardiovascular;  Laterality: N/A;   LEFT HEART CATH AND CORONARY ANGIOGRAPHY Left 12/02/2018   Procedure: LEFT HEART CATH AND CORONARY ANGIOGRAPHY;  Surgeon: Dionisio David, MD;  Location: [place] [facility];  Service: Cardiovascular;  Laterality: Left;   OPEN REDUCTION INTERNAL FIXATION (ORIF) DISTAL RADIAL FRACTURE Right 08/12/2018   Procedure: OPEN REDUCTION INTERNAL FIXATION (ORIF) DISTAL RADIAL FRACTURE;  Surgeon: [name], MD;  Location: [facility];  Service: Orthopedics;  Laterality: Right;   POLYPECTOMY  11/20/2016   Procedure: POLYPECTOMY;  Surgeon: Lucilla Lame, MD;  Location: [place];  Service: Endoscopy;;   TUBAL LIGATION     VAGINAL HYSTERECTOMY     VULVECTOMY Right 01/31/2016   Procedure: WIDE EXCISION VULVECTOMY-RIGHT LABIA;  Surgeon: Brayton Mars, MD;  Location: [facility];  Service: Gynecology;  Laterality: Right;     Family History  Problem Relation Age of Onset   Heart disease Father    Stroke Mother    Hypertension Mother    Lung cancer Brother    Heart attack Brother    Congestive Heart Failure Sister    Cervical cancer Sister    Cancer Neg Hx    Diabetes Neg Hx    Breast cancer Neg Hx     Allergies  Allergen Reactions   Requip [Ropinirole Hcl] Anaphylaxis    Throat closing   Celebrex [Celecoxib] Itching   Meloxicam Palpitations   Nitrofurantoin Palpitations    Review of Systems  Constitutional:  Positive for malaise/fatigue. Negative for chills, diaphoresis, fever and weight loss.  HENT:  Positive for hearing  loss. Negative for congestion, ear discharge, ear pain, sinus pain and tinnitus.   Eyes: Negative.   Respiratory: Negative.    Cardiovascular: Negative.   Gastrointestinal: Negative.   Genitourinary:  Positive for frequency and urgency. Negative for dysuria, flank pain and hematuria.  Musculoskeletal: Negative.   Neurological: Negative.   Psychiatric/Behavioral: Negative.         Objective:   BP 120/68   Pulse (!) 56   Ht '5\' 3"'$  (1.6 m)   Wt 151  lb 6.4 oz (68.7 kg)   SpO2 97%   BMI 26.82 kg/m   Vitals:   09/25/22 1055  BP: 120/68  Pulse: (!) 56  Height: '5\' 3"'$  (1.6 m)  Weight: 151 lb 6.4 oz (68.7 kg)  SpO2: 97%  BMI (Calculated): 26.83    Physical Exam Vitals and nursing note reviewed.  Constitutional:      Appearance: Normal appearance.  HENT:     Head: Normocephalic.  Cardiovascular:     Rate and Rhythm: Normal rate.     Pulses: Normal pulses.     Heart sounds: Normal heart sounds.  Pulmonary:     Effort: Pulmonary effort is normal.     Breath sounds: Normal breath sounds.  Abdominal:     General: Abdomen is flat. Bowel sounds are normal.     Palpations: Abdomen is soft.  Musculoskeletal:        General: Normal range of motion.     Cervical back: Normal range of motion and neck supple.  Skin:    General: Skin is warm and dry.  Neurological:     General: No focal deficit present.     Mental Status: She is alert and oriented to person, place, and time.      Results for orders placed or performed in visit on 09/25/22  POCT Urinalysis Dipstick (81002)  Result Value Ref Range   Color, UA     Clarity, UA     Glucose, UA Negative Negative   Bilirubin, UA Negative    Ketones, UA Negative    Spec Grav, UA 1.020 1.010 - 1.025   Blood, UA Negative    pH, UA 5.5 5.0 - 8.0   Protein, UA Negative Negative   Urobilinogen, UA 0.2 0.2 or 1.0 E.U./dL   Nitrite, UA Positive    Leukocytes, UA Trace (A) Negative   Appearance     Odor      Recent Results  (from the past 2160 hour(s))  Lactate dehydrogenase     Status: None   Collection Time: 06/28/22 10:01 AM  Result Value Ref Range   LDH 124 98 - 192 U/L    Comment: Performed at [facility], [address] [place], [place] [address]  Comprehensive metabolic panel     Status: Abnormal   Collection Time: 06/28/22 10:01 AM  Result Value Ref Range   Sodium 129 (L) 135 - 145 mmol/L   Potassium 3.9 3.5 - 5.1 mmol/L   [place] 93 (L) 98 - 111 mmol/L   CO2 28 22 - 32 mmol/L   Glucose, Bld 96 70 - 99 mg/dL    Comment: Glucose reference range applies only to samples taken after fasting for at least 8 hours.   BUN 17 8 - 23 mg/dL   Creatinine, Ser 1.22 (H) 0.44 - 1.00 mg/dL   Calcium 9.8 8.9 - 10.3 mg/dL   Total Protein 7.2 6.5 - 8.1 g/dL   Albumin 4.4 3.5 - 5.0 g/dL   AST 14 (L) 15 - 41 U/L   ALT 15 0 - 44 U/L   Alkaline Phosphatase 78 38 - 126 U/L   Total Bilirubin 0.8 0.3 - 1.2 mg/dL   GFR, Estimated 46 (L) >60 mL/min    Comment: (NOTE) Calculated using the CKD-EPI Creatinine Equation (2021)    Anion gap 8 5 - 15    Comment: Performed at [facility], [address] [place], [place] [address]  CBC with Differential/Platelet     Status: Abnormal  Collection Time: 06/28/22 10:01 AM  Result Value Ref Range   WBC 41.3 (H) 4.0 - 10.5 K/uL   RBC 4.34 3.87 - 5.11 MIL/uL   Hemoglobin 13.1 12.0 - 15.0 g/dL   HCT 40.1 36.0 - 46.0 %   MCV 92.4 80.0 - 100.0 fL   MCH 30.2 26.0 - 34.0 pg   MCHC 32.7 30.0 - 36.0 g/dL   RDW 13.7 11.5 - 15.5 %   Platelets 215 150 - 400 K/uL   nRBC 0.0 0.0 - 0.2 %   Neutrophils Relative % 9 %   Neutro Abs 3.5 1.7 - 7.7 K/uL   Lymphocytes Relative 84 %   Lymphs Abs 34.7 (H) 0.7 - 4.0 K/uL   Monocytes Relative 7 %   Monocytes Absolute 2.8 (H) 0.1 - 1.0 K/uL   Eosinophils Relative 0 %   Eosinophils Absolute 0.1 0.0 - 0.5 K/uL   Basophils Relative 0 %   Basophils Absolute 0.1 0.0 - 0.1 K/uL   WBC Morphology ABSOLUTE LYMPHOCYTOSIS      Comment: SMUDGE CELLS DIFF. CONFIRMED BY SMEAR    RBC Morphology MORPHOLOGY UNREMARKABLE    Smear Review Normal platelet morphology     Comment: PLATELETS APPEAR ADEQUATE   Immature Granulocytes 0 %   Abs Immature Granulocytes 0.06 0.00 - 0.07 K/uL    Comment: Performed at [name], [address] [place], [place] [address]  Comprehensive metabolic panel     Status: Abnormal   Collection Time: 08/02/22  3:00 PM  Result Value Ref Range   Sodium 138 135 - 145 mmol/L   Potassium 3.9 3.5 - 5.1 mmol/L   [place] 103 98 - 111 mmol/L   CO2 27 22 - 32 mmol/L   Glucose, Bld 92 70 - 99 mg/dL    Comment: Glucose reference range applies only to samples taken after fasting for at least 8 hours.   BUN 12 8 - 23 mg/dL   Creatinine, Ser 1.11 (H) 0.44 - 1.00 mg/dL   Calcium 9.7 8.9 - 10.3 mg/dL   Total Protein 6.9 6.5 - 8.1 g/dL   Albumin 4.2 3.5 - 5.0 g/dL   AST 14 (L) 15 - 41 U/L   ALT 11 0 - 44 U/L   Alkaline Phosphatase 60 38 - 126 U/L   Total Bilirubin 0.2 (L) 0.3 - 1.2 mg/dL   GFR, Estimated 51 (L) >60 mL/min    Comment: (NOTE) Calculated using the CKD-EPI Creatinine Equation (2021)    Anion gap 8 5 - 15    Comment: Performed at [facility], [name] [place], [place] [address]  CBC with Differential/Platelet     Status: Abnormal   Collection Time: 08/02/22  3:00 PM  Result Value Ref Range   WBC 36.2 (H) 4.0 - 10.5 K/uL   RBC 3.95 3.87 - 5.11 MIL/uL   Hemoglobin 12.0 12.0 - 15.0 g/dL   HCT 36.9 36.0 - 46.0 %   MCV 93.4 80.0 - 100.0 fL   MCH 30.4 26.0 - 34.0 pg   MCHC 32.5 30.0 - 36.0 g/dL   RDW 13.5 11.5 - 15.5 %   Platelets 243 150 - 400 K/uL   nRBC 0.0 0.0 - 0.2 %   Neutrophils Relative % 8 %   Neutro Abs 2.9 1.7 - 7.7 K/uL   Lymphocytes Relative 87 %   Lymphs Abs 31.3 (H) 0.7 - 4.0 K/uL   Monocytes Relative 5 %   Monocytes Absolute 1.7 (H) 0.1 - 1.0  K/uL   Eosinophils Relative 0 %   Eosinophils Absolute 0.1 0.0 - 0.5 K/uL   Basophils Relative 0  %   Basophils Absolute 0.1 0.0 - 0.1 K/uL   WBC Morphology SMUDGE CELLS     Comment: DIFF CONFIRMED BY MANUAL.  CONISISTANT WITH KNOWN CLL.   RBC Morphology UNREMARKABLE    Smear Review Normal platelet morphology     Comment: PLATELETS APPEAR ADEQUATE   Immature Granulocytes 0 %   Abs Immature Granulocytes 0.06 0.00 - 0.07 K/uL    Comment: Performed at [facility], [address] [place], [place] [address]  Glucose, capillary     Status: None   Collection Time: 09/11/22 10:14 AM  Result Value Ref Range   Glucose-Capillary 90 70 - 99 mg/dL    Comment: Glucose reference range applies only to samples taken after fasting for at least 8 hours.  POCT Urinalysis Dipstick [id number])     Status: Abnormal   Collection Time: 09/25/22 11:33 AM  Result Value Ref Range   Color, UA     Clarity, UA     Glucose, UA Negative Negative   Bilirubin, UA Negative    Ketones, UA Negative    Spec Grav, UA 1.020 1.010 - 1.025   Blood, UA Negative    pH, UA 5.5 5.0 - 8.0   Protein, UA Negative Negative   Urobilinogen, UA 0.2 0.2 or 1.0 E.U./dL   Nitrite, UA Positive    Leukocytes, UA Trace (A) Negative   Appearance     Odor        Assessment & Plan:  To continue all medications as such.  Ditropan XL has been added to help with bladder control issues.  Will check her blood work at next visit. Problem List Items Addressed This Visit     History of UTI - Primary   Relevant Orders   POCT Urinalysis Dipstick (81002) (Completed)   CLL (chronic lymphocytic leukemia) (HCC) (Chronic)   Coronary artery disease   Paroxysmal atrial fibrillation (HCC)   Essential hypertension, benign   Mixed hyperlipidemia   Mixed stress and urge urinary incontinence   Relevant Medications   oxybutynin (DITROPAN XL) 10 MG 24 hr tablet   OSA on CPAP   Cigarette nicotine dependence without complication    Return in about 4 weeks (around 10/23/2022).   Total time spent: 30 minutes  Perrin Maltese,  MD  09/25/2022

## 2022-10-02 ENCOUNTER — Ambulatory Visit: Payer: 59 | Admitting: Cardiovascular Disease

## 2022-10-03 ENCOUNTER — Other Ambulatory Visit: Payer: 59

## 2022-10-03 ENCOUNTER — Other Ambulatory Visit (HOSPITAL_COMMUNITY): Payer: Self-pay

## 2022-10-16 ENCOUNTER — Ambulatory Visit: Payer: 59 | Admitting: Cardiovascular Disease

## 2022-10-17 ENCOUNTER — Inpatient Hospital Stay: Payer: 59 | Attending: Oncology

## 2022-10-17 NOTE — Progress Notes (Signed)
Nutrition:  Called patient for scheduled nutrition telephone visit.  No answer.  Left message with call back number.   Maisee Vollman B. Zenia Resides, [place], [place] Registered Dietitian [phone]

## 2022-10-19 ENCOUNTER — Ambulatory Visit (INDEPENDENT_AMBULATORY_CARE_PROVIDER_SITE_OTHER): Payer: 59 | Admitting: Cardiovascular Disease

## 2022-10-19 ENCOUNTER — Encounter: Payer: Self-pay | Admitting: Cardiovascular Disease

## 2022-10-19 VITALS — BP 130/90 | HR 54 | Ht 62.5 in | Wt 154.4 lb

## 2022-10-19 DIAGNOSIS — I480 Paroxysmal atrial fibrillation: Secondary | ICD-10-CM

## 2022-10-19 DIAGNOSIS — I10 Essential (primary) hypertension: Secondary | ICD-10-CM

## 2022-10-19 DIAGNOSIS — I2510 Atherosclerotic heart disease of native coronary artery without angina pectoris: Secondary | ICD-10-CM | POA: Diagnosis not present

## 2022-10-19 DIAGNOSIS — R0789 Other chest pain: Secondary | ICD-10-CM

## 2022-10-19 MED ORDER — NITROGLYCERIN 0.4 MG SL SUBL: 0.4000 mg | SUBLINGUAL_TABLET | SUBLINGUAL | 1 refills | Status: DC | PRN

## 2022-10-19 NOTE — Progress Notes (Signed)
Cardiology Office Note   Date:  10/19/2022   ID:  Karen [name], Karen [name] 1945-10-22, MRN [id number]  PCP:  Perrin Maltese, MD  Cardiologist:  Neoma Laming, MD      History of Present Illness: Karen [name] is a 77 y.o. female who presents for  Chief Complaint  Patient presents with   Follow-up    4 month follow up    Chest Pain  This is a new problem. The current episode started in the past 7 days. The problem occurs every several days. The problem has been resolved. The pain is moderate. The quality of the pain is described as tightness.      Past Medical History:  Diagnosis Date   Anxiety    Artery occlusion    Asthma    Back pain    Bronchitis    Cancer (HCC)    Skin Cancer   CLL (chronic lymphocytic leukemia) (HCC)    Coronary artery disease    Depression    Dyspnea    GERD (gastroesophageal reflux disease)    Herpes    History of kidney stones    Hypercholesteremia    Hyperlipemia    Hypertension    Hypothyroidism    Myocardial infarction ([place]) 1999   Neuropathy    Restless leg syndrome    Sleep apnea    use C-PAP   Wears dentures    full upper, partial lower.  doesn't currently wear     Past Surgical History:  Procedure Laterality Date   COLONOSCOPY WITH PROPOFOL N/A 11/20/2016   Procedure: COLONOSCOPY WITH PROPOFOL;  Surgeon: Lucilla Lame, MD;  Location: [place];  Service: Endoscopy;  Laterality: N/A;  sleep apnea   CORONARY ANGIOPLASTY WITH STENT PLACEMENT  2008   DILATION AND CURETTAGE OF UTERUS     ESOPHAGOGASTRODUODENOSCOPY (EGD) WITH PROPOFOL N/A 03/09/2022   Procedure: ESOPHAGOGASTRODUODENOSCOPY (EGD) WITH PROPOFOL;  Surgeon: Lucilla Lame, MD;  Location: [facility];  Service: Endoscopy;  Laterality: N/A;   EYE SURGERY Bilateral    Cataract Extraction with IOL   FRACTURE SURGERY     HAND SURGERY Left 12/2017   upper hand fracture repair   LEFT HEART CATH AND CORONARY ANGIOGRAPHY N/A 06/29/2017   Procedure: LEFT HEART CATH  AND CORONARY ANGIOGRAPHY;  Surgeon: Dionisio David, MD;  Location: [place] [facility];  Service: Cardiovascular;  Laterality: N/A;   LEFT HEART CATH AND CORONARY ANGIOGRAPHY Left 12/02/2018   Procedure: LEFT HEART CATH AND CORONARY ANGIOGRAPHY;  Surgeon: Dionisio David, MD;  Location: [place] [facility];  Service: Cardiovascular;  Laterality: Left;   OPEN REDUCTION INTERNAL FIXATION (ORIF) DISTAL RADIAL FRACTURE Right 08/12/2018   Procedure: OPEN REDUCTION INTERNAL FIXATION (ORIF) DISTAL RADIAL FRACTURE;  Surgeon: [name], MD;  Location: [facility];  Service: Orthopedics;  Laterality: Right;   POLYPECTOMY  11/20/2016   Procedure: POLYPECTOMY;  Surgeon: Lucilla Lame, MD;  Location: [place];  Service: Endoscopy;;   TUBAL LIGATION     VAGINAL HYSTERECTOMY     VULVECTOMY Right 01/31/2016   Procedure: WIDE EXCISION VULVECTOMY-RIGHT LABIA;  Surgeon: Brayton Mars, MD;  Location: [facility];  Service: Gynecology;  Laterality: Right;     Current Outpatient Medications  Medication Sig Dispense Refill   acetaminophen (TYLENOL) 500 MG tablet Take 500-1,000 mg by mouth every 6 (six) hours as needed for moderate pain or fever.     albuterol (VENTOLIN HFA) 108 (90 Base) MCG/ACT inhaler Inhale 1-2 puffs into the lungs  every 6 (six) hours as needed for wheezing or shortness of breath.     amLODipine (NORVASC) 5 MG tablet Take 5 mg by mouth daily. 1 Tablet(s) By Mouth Daily     aspirin EC 81 MG tablet Take 1 tablet (81 mg total) by mouth daily. 30 tablet    atorvastatin (LIPITOR) 40 MG tablet Take 1 tablet (40 mg total) by mouth daily. 30 tablet 2   buPROPion (WELLBUTRIN XL) 300 MG 24 hr tablet Take 300 mg by mouth daily.     isosorbide mononitrate (IMDUR) 120 MG 24 hr tablet Take 120 mg by mouth daily.     lisinopril (ZESTRIL) 20 MG tablet Take 20 mg by mouth daily.     lisinopril-hydrochlorothiazide (ZESTORETIC) 20-25 MG tablet Take 1 tablet by mouth daily.     metoprolol  succinate (TOPROL-XL) 50 MG 24 hr tablet Take 100 mg by mouth daily.     ondansetron (ZOFRAN) 4 MG tablet Take 1 tablet (4 mg total) by mouth every 8 (eight) hours as needed for nausea or vomiting. 60 tablet 1   oxybutynin (DITROPAN XL) 10 MG 24 hr tablet Take 1 tablet (10 mg total) by mouth daily. 30 tablet 2   pantoprazole (PROTONIX) 40 MG tablet Take 1 tablet (40 mg total) by mouth 2 (two) times daily. 60 tablet 0   traZODone (DESYREL) 50 MG tablet Take 50 mg by mouth at bedtime.     furosemide (LASIX) 20 MG tablet Take 20 mg by mouth daily. (Patient not taking: Reported on 03/16/2022)     lisinopril (ZESTRIL) 40 MG tablet Hold until followup with outpatient provider due to acute kidney injury. (Patient not taking: Reported on 06/28/2022)     nitroGLYCERIN (NITROSTAT) 0.4 MG SL tablet Place 1 tablet (0.4 mg total) under the tongue every 5 (five) minutes as needed for chest pain. 30 tablet 1   PREMARIN vaginal cream USE 1 GRAM INTRAVAGINALLY TWICE PER WEEK (Patient not taking: Reported on 09/19/2022) 30 g 3   rivaroxaban (XARELTO) 2.5 MG TABS tablet Take 1 tablet (2.5 mg total) by mouth 2 (two) times daily. (Patient not taking: Reported on 10/19/2022) 60 tablet 0   zanubrutinib (BRUKINSA) 80 MG capsule Take 2 capsules (160 mg total) by mouth 2 (two) times daily. (Patient not taking: Reported on 09/19/2022) 120 capsule 1   No current facility-administered medications for this visit.    Allergies:   Requip [ropinirole hcl], Celebrex [celecoxib], Meloxicam, and Nitrofurantoin    Social History:   reports that she has been smoking cigarettes. She has a 35.00 pack-year smoking history. Her smokeless tobacco use includes snuff. She reports that she does not drink alcohol and does not use drugs.   Family History:  family history includes Cervical cancer in her sister; Congestive Heart Failure in her sister; Heart attack in her brother; Heart disease in her father; Hypertension in her mother; Lung cancer in  her brother; Stroke in her mother.    ROS:     Review of Systems  Constitutional: Negative.   HENT: Negative.    Eyes: Negative.   Respiratory: Negative.    Cardiovascular:  Positive for chest pain.  Gastrointestinal: Negative.   Genitourinary: Negative.   Musculoskeletal: Negative.   Skin: Negative.   Neurological: Negative.   Endo/Heme/Allergies: Negative.   Psychiatric/Behavioral: Negative.    All other systems reviewed and are negative.     All other systems are reviewed and negative.    PHYSICAL EXAM: VS:  BP (!) 130/90  Pulse (!) 54   Ht 5' 2.5" (1.588 m)   Wt 154 lb 6.4 oz (70 kg)   SpO2 97%   BMI 27.79 kg/m  , BMI Body mass index is 27.79 kg/m. Last weight:  Wt Readings from Last 3 Encounters:  10/19/22 154 lb 6.4 oz (70 kg)  09/25/22 151 lb 6.4 oz (68.7 kg)  09/19/22 153 lb 6.4 oz (69.6 kg)     Physical Exam Constitutional:      Appearance: Normal appearance.  Cardiovascular:     Rate and Rhythm: Normal rate and regular rhythm.     Heart sounds: Normal heart sounds.  Pulmonary:     Effort: Pulmonary effort is normal.     Breath sounds: Normal breath sounds.  Musculoskeletal:     Right lower leg: No edema.     Left lower leg: No edema.  Neurological:     Mental Status: She is alert.       EKG:   Recent Labs: 03/10/2022: Magnesium 2.4 08/02/2022: ALT 11; BUN 12; Creatinine, Ser 1.11; Hemoglobin 12.0; Platelets 243; Potassium 3.9; Sodium 138    Lipid Panel    Component Value Date/Time   CHOL 176 08/10/2020 0523   TRIG 87 08/10/2020 0523   HDL 21 (L) 08/10/2020 0523   CHOLHDL 8.4 08/10/2020 0523   VLDL 17 08/10/2020 0523   LDLCALC 138 (H) 08/10/2020 0523      Other studies Reviewed: Additional studies/ records that were reviewed today include:  Review of the above records demonstrates:       No data to display            ASSESSMENT AND PLAN:    ICD-10-CM   1. Other chest pain  R07.89 MYOCARDIAL PERFUSION IMAGING    PCV  ECHOCARDIOGRAM COMPLETE    nitroGLYCERIN (NITROSTAT) 0.4 MG SL tablet   Has stent in LAD, thus will do echo and stress test    2. Coronary artery disease involving native coronary artery of native heart without angina pectoris  I25.10 MYOCARDIAL PERFUSION IMAGING    PCV ECHOCARDIOGRAM COMPLETE    nitroGLYCERIN (NITROSTAT) 0.4 MG SL tablet    3. Essential hypertension, benign  I10 MYOCARDIAL PERFUSION IMAGING    PCV ECHOCARDIOGRAM COMPLETE    nitroGLYCERIN (NITROSTAT) 0.4 MG SL tablet    4. Paroxysmal atrial fibrillation (HCC)  I48.0 MYOCARDIAL PERFUSION IMAGING    PCV ECHOCARDIOGRAM COMPLETE    nitroGLYCERIN (NITROSTAT) 0.4 MG SL tablet       Problem List Items Addressed This Visit       Cardiovascular and Mediastinum   Coronary artery disease   Relevant Medications   nitroGLYCERIN (NITROSTAT) 0.4 MG SL tablet   Other Relevant Orders   MYOCARDIAL PERFUSION IMAGING   PCV ECHOCARDIOGRAM COMPLETE   Paroxysmal atrial fibrillation (HCC)   Relevant Medications   nitroGLYCERIN (NITROSTAT) 0.4 MG SL tablet   Other Relevant Orders   MYOCARDIAL PERFUSION IMAGING   PCV ECHOCARDIOGRAM COMPLETE   Essential hypertension, benign   Relevant Medications   nitroGLYCERIN (NITROSTAT) 0.4 MG SL tablet   Other Relevant Orders   MYOCARDIAL PERFUSION IMAGING   PCV ECHOCARDIOGRAM COMPLETE   Other Visit Diagnoses     Other chest pain    -  Primary   Has stent in LAD, thus will do echo and stress test   Relevant Medications   nitroGLYCERIN (NITROSTAT) 0.4 MG SL tablet   Other Relevant Orders   MYOCARDIAL PERFUSION IMAGING   PCV ECHOCARDIOGRAM COMPLETE  Disposition:   Return in about 4 weeks (around 11/16/2022) for after echo and stress test.    Total time spent: 30 minutes  Signed,  Neoma Laming, MD  10/19/2022 11:56 AM    [place]

## 2022-10-23 ENCOUNTER — Other Ambulatory Visit: Payer: Self-pay | Admitting: Cardiovascular Disease

## 2022-10-23 DIAGNOSIS — I2510 Atherosclerotic heart disease of native coronary artery without angina pectoris: Secondary | ICD-10-CM

## 2022-10-23 DIAGNOSIS — I10 Essential (primary) hypertension: Secondary | ICD-10-CM

## 2022-10-23 DIAGNOSIS — I480 Paroxysmal atrial fibrillation: Secondary | ICD-10-CM

## 2022-10-23 DIAGNOSIS — R0789 Other chest pain: Secondary | ICD-10-CM

## 2022-11-07 ENCOUNTER — Ambulatory Visit (INDEPENDENT_AMBULATORY_CARE_PROVIDER_SITE_OTHER): Payer: 59 | Admitting: Internal Medicine

## 2022-11-07 ENCOUNTER — Encounter: Payer: Self-pay | Admitting: Internal Medicine

## 2022-11-07 VITALS — BP 128/62 | HR 65 | Ht 63.0 in | Wt 152.2 lb

## 2022-11-07 DIAGNOSIS — I10 Essential (primary) hypertension: Secondary | ICD-10-CM

## 2022-11-07 DIAGNOSIS — I2510 Atherosclerotic heart disease of native coronary artery without angina pectoris: Secondary | ICD-10-CM

## 2022-11-07 DIAGNOSIS — N3281 Overactive bladder: Secondary | ICD-10-CM

## 2022-11-07 DIAGNOSIS — C9110 Chronic lymphocytic leukemia of B-cell type not having achieved remission: Secondary | ICD-10-CM | POA: Diagnosis not present

## 2022-11-07 DIAGNOSIS — F411 Generalized anxiety disorder: Secondary | ICD-10-CM

## 2022-11-07 DIAGNOSIS — I480 Paroxysmal atrial fibrillation: Secondary | ICD-10-CM | POA: Diagnosis not present

## 2022-11-07 MED ORDER — TRAZODONE HCL 50 MG PO TABS: 100.0000 mg | ORAL_TABLET | Freq: Every day | ORAL | 2 refills | Status: DC

## 2022-11-07 MED ORDER — NITROGLYCERIN 0.4 MG SL SUBL: 0.4000 mg | SUBLINGUAL_TABLET | SUBLINGUAL | 3 refills | Status: AC | PRN

## 2022-11-07 NOTE — Progress Notes (Signed)
Established Patient Office Visit  Subjective:  Patient ID: Karen [name], female    DOB: 14-Sep-1945  Age: 77 y.o. MRN: [id number]  Chief Complaint  Patient presents with   Follow-up    4 week follow up    Patient comes in for her follow-up.  She says she is feeling quite well, but she has still not started her oxybutynin for bladder control.  She agrees to start taking it from today.  She also mentions that she feels like her Wellbutrin is not helping control her anxiety and she has trouble sleeping at night.  Currently she is on 300 mg of Wellbutrin XL , as well as Trazodone 50 mg at bedtime.  Will increase the dose of Trazodone 100 mg and see if it helps her sleep better.    No other concerns at this time.   Past Medical History:  Diagnosis Date   Anxiety    Artery occlusion    Asthma    Back pain    Bronchitis    Cancer    Skin Cancer   CLL (chronic lymphocytic leukemia)    Coronary artery disease    Depression    Dyspnea    GERD (gastroesophageal reflux disease)    Herpes    History of kidney stones    Hypercholesteremia    Hyperlipemia    Hypertension    Hypothyroidism    Myocardial infarction 1999   Neuropathy    Restless leg syndrome    Sleep apnea    use C-PAP   Wears dentures    full upper, partial lower.  doesn't currently wear    Past Surgical History:  Procedure Laterality Date   COLONOSCOPY WITH PROPOFOL N/A 11/20/2016   Procedure: COLONOSCOPY WITH PROPOFOL;  Surgeon: Midge Minium, MD;  Location: [facility];  Service: Endoscopy;  Laterality: N/A;  sleep apnea   CORONARY ANGIOPLASTY WITH STENT PLACEMENT  2008   DILATION AND CURETTAGE OF UTERUS     ESOPHAGOGASTRODUODENOSCOPY (EGD) WITH PROPOFOL N/A 03/09/2022   Procedure: ESOPHAGOGASTRODUODENOSCOPY (EGD) WITH PROPOFOL;  Surgeon: Midge Minium, MD;  Location: [facility];  Service: Endoscopy;  Laterality: N/A;   EYE SURGERY Bilateral    Cataract Extraction with IOL   FRACTURE SURGERY      HAND SURGERY Left 12/2017   upper hand fracture repair   LEFT HEART CATH AND CORONARY ANGIOGRAPHY N/A 06/29/2017   Procedure: LEFT HEART CATH AND CORONARY ANGIOGRAPHY;  Surgeon: Laurier Nancy, MD;  Location: [facility];  Service: Cardiovascular;  Laterality: N/A;   LEFT HEART CATH AND CORONARY ANGIOGRAPHY Left 12/02/2018   Procedure: LEFT HEART CATH AND CORONARY ANGIOGRAPHY;  Surgeon: Laurier Nancy, MD;  Location: [facility];  Service: Cardiovascular;  Laterality: Left;   OPEN REDUCTION INTERNAL FIXATION (ORIF) DISTAL RADIAL FRACTURE Right 08/12/2018   Procedure: OPEN REDUCTION INTERNAL FIXATION (ORIF) DISTAL RADIAL FRACTURE;  Surgeon: Deeann Saint, MD;  Location: [facility];  Service: Orthopedics;  Laterality: Right;   POLYPECTOMY  11/20/2016   Procedure: POLYPECTOMY;  Surgeon: Midge Minium, MD;  Location: [facility];  Service: Endoscopy;;   TUBAL LIGATION     VAGINAL HYSTERECTOMY     VULVECTOMY Right 01/31/2016   Procedure: WIDE EXCISION VULVECTOMY-RIGHT LABIA;  Surgeon: Herold Harms, MD;  Location: [facility];  Service: Gynecology;  Laterality: Right;    Social History   Socioeconomic History   Marital status: Single    Spouse name: Not on file   Number of children:  5   Years of education: Not on file   Highest education level: Not on file  Occupational History   Occupation: part time job    Comment: [employer]  Tobacco Use   Smoking status: Every Day    Packs/day: 1.00    Years: 35.00    Additional pack years: 0.00    Total pack years: 35.00    Types: Cigarettes   Smokeless tobacco: Current    Types: Snuff  Vaping Use   Vaping Use: Never used  Substance and Sexual Activity   Alcohol use: No    Alcohol/week: 0.0 standard drinks of alcohol   Drug use: No   Sexual activity: Not Currently    Partners: Male    Birth control/protection: Surgical  Other Topics Concern   Not on file  Social History Narrative   Not on file   Social  Determinants of Health   Financial Resource Strain: Low Risk  (12/02/2018)   Overall Financial Resource Strain (CARDIA)    Difficulty of Paying Living Expenses: Not very hard  Food Insecurity: No Food Insecurity (12/02/2018)   Hunger Vital Sign    Worried About Running Out of Food in the Last Year: Never true    Ran Out of Food in the Last Year: Never true  Transportation Needs: No Transportation Needs (12/02/2018)   PRAPARE - Administrator, Civil Service (Medical): No    Lack of Transportation (Non-Medical): No  Physical Activity: Inactive (12/18/2017)   Exercise Vital Sign    Days of Exercise per Week: 0 days    Minutes of Exercise per Session: 0 min  Stress: No Stress Concern Present (12/02/2018)   [employer] of Occupational Health - Occupational Stress Questionnaire    Feeling of Stress : Only a little  Social Connections: Unknown (12/02/2018)   Social Connection and Isolation Panel [NHANES]    Frequency of Communication with Friends and Family: More than three times a week    Frequency of Social Gatherings with Friends and Family: Not on file    Attends Religious Services: Not on file    Active Member of Clubs or Organizations: Not on file    Attends Banker Meetings: Not on file    Marital Status: Not on file  Intimate Partner Violence: Not At Risk (12/02/2018)   Humiliation, Afraid, Rape, and Kick questionnaire    Fear of Current or Ex-Partner: No    Emotionally Abused: No    Physically Abused: No    Sexually Abused: No    Family History  Problem Relation Age of Onset   Heart disease Father    Stroke Mother    Hypertension Mother    Lung cancer Brother    Heart attack Brother    Congestive Heart Failure Sister    Cervical cancer Sister    Cancer Neg Hx    Diabetes Neg Hx    Breast cancer Neg Hx     Allergies  Allergen Reactions   Requip [Ropinirole Hcl] Anaphylaxis    Throat closing   Celebrex [Celecoxib] Itching   Meloxicam  Palpitations   Nitrofurantoin Palpitations    Review of Systems  Constitutional:  Positive for malaise/fatigue. Negative for chills, diaphoresis and fever.  HENT:  Positive for hearing loss. Negative for congestion, ear discharge, nosebleeds and tinnitus.   Eyes: Negative.   Respiratory: Negative.    Cardiovascular: Negative.   Gastrointestinal: Negative.   Genitourinary:  Positive for urgency. Negative for dysuria and frequency.  Musculoskeletal: Negative.   Neurological:  Positive for dizziness and sensory change. Negative for speech change, seizures and headaches.  Psychiatric/Behavioral:  Positive for depression. Negative for hallucinations, memory loss, substance abuse and suicidal ideas. The patient is nervous/anxious and has insomnia.        Objective:   BP 128/62   Pulse 65   Ht  (1.6 m)   Wt 152 lb 3.2 oz (69 kg)   SpO2 95%   BMI 26.96 kg/m   Vitals:   11/07/22 1347  BP: 128/62  Pulse: 65  Height:  (1.6 m)  Weight: 152 lb 3.2 oz (69 kg)  SpO2: 95%  BMI (Calculated): 26.97    Physical Exam Vitals and nursing note reviewed.  Constitutional:      Appearance: Normal appearance.  HENT:     Head: Normocephalic.  Cardiovascular:     Rate and Rhythm: Normal rate. Rhythm irregular.     Pulses: Normal pulses.     Heart sounds: Normal heart sounds.  Pulmonary:     Effort: Pulmonary effort is normal.     Breath sounds: Normal breath sounds.  Abdominal:     Palpations: Abdomen is soft. There is no mass.     Tenderness: There is no abdominal tenderness.  Musculoskeletal:        General: No swelling or tenderness. Normal range of motion.     Cervical back: Normal range of motion and neck supple.  Neurological:     General: No focal deficit present.     Mental Status: She is alert.  Psychiatric:        Mood and Affect: Mood normal.        Behavior: Behavior normal.        Thought Content: Thought content normal.      No results found for any  visits on 11/07/22.      Assessment & Plan:  Patient advised to continue taking all medications as such including oxybutynin.  Also will increase the dose of trazodone and see if it helps her. Will check labs at next follow-up. Problem List Items Addressed This Visit     CLL (chronic lymphocytic leukemia) - Primary (Chronic)   Coronary artery disease   Relevant Medications   nitroGLYCERIN (NITROSTAT) 0.4 MG SL tablet   Paroxysmal atrial fibrillation   Relevant Medications   nitroGLYCERIN (NITROSTAT) 0.4 MG SL tablet   Essential hypertension, benign   Relevant Medications   nitroGLYCERIN (NITROSTAT) 0.4 MG SL tablet   Overactive bladder   GAD (generalized anxiety disorder)   Relevant Medications   traZODone (DESYREL) 50 MG tablet   Follow up in 2 months.  Total time spent: 20 5 minutes  Margaretann Loveless, MD  11/07/2022

## 2022-11-13 ENCOUNTER — Other Ambulatory Visit: Payer: Self-pay | Admitting: Cardiovascular Disease

## 2022-11-13 ENCOUNTER — Other Ambulatory Visit: Payer: Self-pay | Admitting: Internal Medicine

## 2022-11-13 DIAGNOSIS — I480 Paroxysmal atrial fibrillation: Secondary | ICD-10-CM

## 2022-11-16 ENCOUNTER — Other Ambulatory Visit: Payer: 59

## 2022-11-20 ENCOUNTER — Ambulatory Visit: Payer: 59 | Admitting: Cardiovascular Disease

## 2022-11-30 ENCOUNTER — Other Ambulatory Visit: Payer: Self-pay | Admitting: Internal Medicine

## 2022-12-06 ENCOUNTER — Other Ambulatory Visit: Payer: Self-pay

## 2022-12-08 ENCOUNTER — Ambulatory Visit: Payer: 59 | Admitting: Cardiovascular Disease

## 2022-12-08 ENCOUNTER — Other Ambulatory Visit: Payer: Self-pay

## 2022-12-08 ENCOUNTER — Other Ambulatory Visit: Payer: Self-pay | Admitting: Internal Medicine

## 2022-12-08 DIAGNOSIS — R079 Chest pain, unspecified: Secondary | ICD-10-CM

## 2022-12-11 ENCOUNTER — Encounter: Payer: Self-pay | Admitting: Internal Medicine

## 2022-12-11 ENCOUNTER — Ambulatory Visit (INDEPENDENT_AMBULATORY_CARE_PROVIDER_SITE_OTHER): Payer: 59 | Admitting: Internal Medicine

## 2022-12-11 VITALS — BP 120/74 | HR 52 | Ht 62.5 in | Wt 146.4 lb

## 2022-12-11 DIAGNOSIS — Z87440 Personal history of urinary (tract) infections: Secondary | ICD-10-CM | POA: Diagnosis not present

## 2022-12-11 DIAGNOSIS — H8110 Benign paroxysmal vertigo, unspecified ear: Secondary | ICD-10-CM | POA: Diagnosis not present

## 2022-12-11 DIAGNOSIS — E782 Mixed hyperlipidemia: Secondary | ICD-10-CM | POA: Diagnosis not present

## 2022-12-11 DIAGNOSIS — C9110 Chronic lymphocytic leukemia of B-cell type not having achieved remission: Secondary | ICD-10-CM | POA: Diagnosis not present

## 2022-12-11 DIAGNOSIS — N3090 Cystitis, unspecified without hematuria: Secondary | ICD-10-CM | POA: Diagnosis not present

## 2022-12-11 DIAGNOSIS — I10 Essential (primary) hypertension: Secondary | ICD-10-CM

## 2022-12-11 LAB — POCT URINALYSIS DIPSTICK
Blood, UA: NEGATIVE
Glucose, UA: NEGATIVE
Ketones, UA: NEGATIVE
Nitrite, UA: NEGATIVE
Protein, UA: POSITIVE — AB
Spec Grav, UA: 1.025 (ref 1.010–1.025)
Urobilinogen, UA: 1 E.U./dL
pH, UA: 6 (ref 5.0–8.0)

## 2022-12-11 MED ORDER — MECLIZINE HCL 25 MG PO TABS
25.0000 mg | ORAL_TABLET | Freq: Three times a day (TID) | ORAL | 2 refills | Status: DC | PRN
Start: 2022-12-11 — End: 2023-08-24

## 2022-12-11 MED ORDER — CIPROFLOXACIN HCL 250 MG PO TABS
250.00 mg | ORAL_TABLET | Freq: Two times a day (BID) | ORAL | 0 refills | Status: DC
Start: 2022-12-11 — End: 2023-01-29

## 2022-12-11 NOTE — Progress Notes (Signed)
Established Patient Office Visit  Subjective:  Patient ID: Karen [name], female    DOB: 1945-12-09  Age: 77 y.o. MRN: [id number]  Chief Complaint  Patient presents with   Follow-up    Dizzy spells & nausea    Patient comes in with 2 days history of dizziness in certain positions of her head.  Patient reports that when she turns her head a certain way or when she gets up from bed she feels extremely lightheaded and unsteady and feels like the room is spinning around her.  Patient does have a history of vertigo in the past and had used meclizine which had helped her. Also complains of bladder area discomfort, dysuria and burning micturition. She does not have any fevers or chills, felt some nausea with dizziness but no vomiting.  No body aches.  No sore throat and no cough or chest congestion.    No other concerns at this time.   Past Medical History:  Diagnosis Date   Anxiety    Artery occlusion    Asthma    Back pain    Bronchitis    Cancer (HCC)    Skin Cancer   CLL (chronic lymphocytic leukemia) (HCC)    Coronary artery disease    Depression    Dyspnea    GERD (gastroesophageal reflux disease)    Herpes    History of kidney stones    Hypercholesteremia    Hyperlipemia    Hypertension    Hypothyroidism    Myocardial infarction (HCC) 1999   Neuropathy    Restless leg syndrome    Sleep apnea    use C-PAP   Wears dentures    full upper, partial lower.  doesn't currently wear    Past Surgical History:  Procedure Laterality Date   COLONOSCOPY WITH PROPOFOL N/A 11/20/2016   Procedure: COLONOSCOPY WITH PROPOFOL;  Surgeon: Midge Minium, MD;  Location: [facility];  Service: Endoscopy;  Laterality: N/A;  sleep apnea   CORONARY ANGIOPLASTY WITH STENT PLACEMENT  2008   DILATION AND CURETTAGE OF UTERUS     ESOPHAGOGASTRODUODENOSCOPY (EGD) WITH PROPOFOL N/A 03/09/2022   Procedure: ESOPHAGOGASTRODUODENOSCOPY (EGD) WITH PROPOFOL;  Surgeon: Midge Minium, MD;   Location: [facility];  Service: Endoscopy;  Laterality: N/A;   EYE SURGERY Bilateral    Cataract Extraction with IOL   FRACTURE SURGERY     HAND SURGERY Left 12/2017   upper hand fracture repair   LEFT HEART CATH AND CORONARY ANGIOGRAPHY N/A 06/29/2017   Procedure: LEFT HEART CATH AND CORONARY ANGIOGRAPHY;  Surgeon: Laurier Nancy, MD;  Location: [facility];  Service: Cardiovascular;  Laterality: N/A;   LEFT HEART CATH AND CORONARY ANGIOGRAPHY Left 12/02/2018   Procedure: LEFT HEART CATH AND CORONARY ANGIOGRAPHY;  Surgeon: Laurier Nancy, MD;  Location: [facility];  Service: Cardiovascular;  Laterality: Left;   OPEN REDUCTION INTERNAL FIXATION (ORIF) DISTAL RADIAL FRACTURE Right 08/12/2018   Procedure: OPEN REDUCTION INTERNAL FIXATION (ORIF) DISTAL RADIAL FRACTURE;  Surgeon: Deeann Saint, MD;  Location: [facility];  Service: Orthopedics;  Laterality: Right;   POLYPECTOMY  11/20/2016   Procedure: POLYPECTOMY;  Surgeon: Midge Minium, MD;  Location: [facility];  Service: Endoscopy;;   TUBAL LIGATION     VAGINAL HYSTERECTOMY     VULVECTOMY Right 01/31/2016   Procedure: WIDE EXCISION VULVECTOMY-RIGHT LABIA;  Surgeon: Herold Harms, MD;  Location: [facility];  Service: Gynecology;  Laterality: Right;    Social History   Socioeconomic History  Marital status: Single    Spouse name: Not on file   Number of children: 5   Years of education: Not on file   Highest education level: Not on file  Occupational History   Occupation: part time job    Comment: [employer]  Tobacco Use   Smoking status: Every Day    Packs/day: 1.00    Years: 35.00    Additional pack years: 0.00    Total pack years: 35.00    Types: Cigarettes   Smokeless tobacco: Current    Types: Snuff  Vaping Use   Vaping Use: Never used  Substance and Sexual Activity   Alcohol use: No    Alcohol/week: 0.0 standard drinks of alcohol   Drug use: No   Sexual activity: Not Currently     Partners: Male    Birth control/protection: Surgical  Other Topics Concern   Not on file  Social History Narrative   Not on file   Social Determinants of Health   Financial Resource Strain: Low Risk  (12/02/2018)   Overall Financial Resource Strain (CARDIA)    Difficulty of Paying Living Expenses: Not very hard  Food Insecurity: No Food Insecurity (12/02/2018)   Hunger Vital Sign    Worried About Running Out of Food in the Last Year: Never true    Ran Out of Food in the Last Year: Never true  Transportation Needs: No Transportation Needs (12/02/2018)   PRAPARE - Administrator, Civil Service (Medical): No    Lack of Transportation (Non-Medical): No  Physical Activity: Inactive (12/18/2017)   Exercise Vital Sign    Days of Exercise per Week: 0 days    Minutes of Exercise per Session: 0 min  Stress: No Stress Concern Present (12/02/2018)   [employer] of Occupational Health - Occupational Stress Questionnaire    Feeling of Stress : Only a little  Social Connections: Unknown (12/02/2018)   Social Connection and Isolation Panel [NHANES]    Frequency of Communication with Friends and Family: More than three times a week    Frequency of Social Gatherings with Friends and Family: Not on file    Attends Religious Services: Not on file    Active Member of Clubs or Organizations: Not on file    Attends Banker Meetings: Not on file    Marital Status: Not on file  Intimate Partner Violence: Not At Risk (12/02/2018)   Humiliation, Afraid, Rape, and Kick questionnaire    Fear of Current or Ex-Partner: No    Emotionally Abused: No    Physically Abused: No    Sexually Abused: No    Family History  Problem Relation Age of Onset   Heart disease Father    Stroke Mother    Hypertension Mother    Lung cancer Brother    Heart attack Brother    Congestive Heart Failure Sister    Cervical cancer Sister    Cancer Neg Hx    Diabetes Neg Hx    Breast cancer Neg Hx      Allergies  Allergen Reactions   Requip [Ropinirole Hcl] Anaphylaxis    Throat closing   Celebrex [Celecoxib] Itching   Meloxicam Palpitations   Nitrofurantoin Palpitations    Review of Systems  Constitutional:  Positive for malaise/fatigue. Negative for chills, diaphoresis, fever and weight loss.  HENT:  Negative for congestion, ear discharge, hearing loss, nosebleeds, sinus pain, sore throat and tinnitus.   Eyes: Negative.   Respiratory:  Negative for  cough, shortness of breath and wheezing.   Cardiovascular:  Negative for chest pain, leg swelling and PND.  Gastrointestinal:  Positive for nausea. Negative for abdominal pain, blood in stool, diarrhea, heartburn, melena and vomiting.  Genitourinary:  Positive for dysuria and frequency. Negative for flank pain, hematuria and urgency.  Musculoskeletal:  Negative for back pain, falls, joint pain, myalgias and neck pain.  Neurological:  Positive for dizziness. Negative for tingling, tremors, seizures, loss of consciousness, weakness and headaches.  Psychiatric/Behavioral: Negative.         Objective:   BP 120/74   Pulse (!) 52   Ht 5' 2.5" (1.588 m)   Wt 146 lb 6.4 oz (66.4 kg)   SpO2 96%   BMI 26.35 kg/m   Vitals:   12/11/22 1148  BP: 120/74  Pulse: (!) 52  Height: 5' 2.5" (1.588 m)  Weight: 146 lb 6.4 oz (66.4 kg)  SpO2: 96%  BMI (Calculated): 26.33    Physical Exam Vitals and nursing note reviewed.  Constitutional:      Appearance: Normal appearance.  Cardiovascular:     Rate and Rhythm: Normal rate and regular rhythm.     Pulses: Normal pulses.  Pulmonary:     Effort: Pulmonary effort is normal.     Breath sounds: Normal breath sounds. No wheezing, rhonchi or rales.  Abdominal:     General: Bowel sounds are normal.     Palpations: Abdomen is soft. There is no mass.     Tenderness: There is no abdominal tenderness. There is no right CVA tenderness or left CVA tenderness.  Musculoskeletal:     Cervical  back: Normal range of motion and neck supple.     Right lower leg: No edema.     Left lower leg: No edema.  Skin:    General: Skin is warm.  Neurological:     General: No focal deficit present.     Mental Status: She is alert and oriented to person, place, and time.     Motor: No weakness.  Psychiatric:        Mood and Affect: Mood normal.        Behavior: Behavior normal.      Results for orders placed or performed in visit on 12/11/22  POCT Urinalysis Dipstick (81002)  Result Value Ref Range   Color, UA     Clarity, UA     Glucose, UA Negative Negative   Bilirubin, UA Small    Ketones, UA Negative    Spec Grav, UA 1.025 1.010 - 1.025   Blood, UA Negative    pH, UA 6.0 5.0 - 8.0   Protein, UA Positive (A) Negative   Urobilinogen, UA 1.0 0.2 or 1.0 E.U./dL   Nitrite, UA Negative    Leukocytes, UA Small (1+) (A) Negative   Appearance     Odor      Recent Results (from the past 2160 hour(s))  POCT Urinalysis Dipstick (16109)     Status: Abnormal   Collection Time: 09/25/22 11:33 AM  Result Value Ref Range   Color, UA     Clarity, UA     Glucose, UA Negative Negative   Bilirubin, UA Negative    Ketones, UA Negative    Spec Grav, UA 1.020 1.010 - 1.025   Blood, UA Negative    pH, UA 5.5 5.0 - 8.0   Protein, UA Negative Negative   Urobilinogen, UA 0.2 0.2 or 1.0 E.U./dL   Nitrite, UA Positive  Leukocytes, UA Trace (A) Negative   Appearance     Odor    POCT Urinalysis Dipstick (40981)     Status: Abnormal   Collection Time: 12/11/22 12:09 PM  Result Value Ref Range   Color, UA     Clarity, UA     Glucose, UA Negative Negative   Bilirubin, UA Small    Ketones, UA Negative    Spec Grav, UA 1.025 1.010 - 1.025   Blood, UA Negative    pH, UA 6.0 5.0 - 8.0   Protein, UA Positive (A) Negative    Comment: Trace   Urobilinogen, UA 1.0 0.2 or 1.0 E.U./dL   Nitrite, UA Negative    Leukocytes, UA Small (1+) (A) Negative   Appearance     Odor         Assessment & Plan:  Her urine dipstick shows some pus cells in the office.  Will send it for culture and empirically start p.o. Cipro.  Prescription also sent for meclizine to be taken as needed.  Patient needs some blood work today as well. Problem List Items Addressed This Visit     History of UTI   Relevant Orders   POCT Urinalysis Dipstick (81002) (Completed)   CLL (chronic lymphocytic leukemia) (HCC) (Chronic)   Relevant Medications   meclizine (ANTIVERT) 25 MG tablet   ciprofloxacin (CIPRO) 250 MG tablet   Other Relevant Orders   CBC With Differential   Essential hypertension, benign   Relevant Orders   CMP14+EGFR   Mixed hyperlipidemia   Relevant Orders   Lipid Profile   Benign paroxysmal positional vertigo - Primary   Relevant Medications   meclizine (ANTIVERT) 25 MG tablet   Other Relevant Orders   POCT Urinalysis Dipstick (19147) (Completed)   Cystitis   Relevant Medications   ciprofloxacin (CIPRO) 250 MG tablet   Other Relevant Orders   Urine Culture    Return in about 4 days (around 12/15/2022).   Total time spent: 30 minutes  Margaretann Loveless, MD  12/11/2022   This document may have been prepared by [facility] Voice Recognition software and as such may include unintentional dictation errors.

## 2022-12-13 LAB — CBC WITH DIFFERENTIAL
Basophils Absolute: 0 10*3/uL (ref 0.0–0.2)
Basos: 0 %
EOS (ABSOLUTE): 0.2 10*3/uL (ref 0.0–0.4)
Eos: 0 %
Hematocrit: 37.3 % (ref 34.0–46.6)
Hemoglobin: 12.2 g/dL (ref 11.1–15.9)
Immature Grans (Abs): 0 10*3/uL (ref 0.0–0.1)
Immature Granulocytes: 0 %
Lymphocytes Absolute: 42.7 10*3/uL — ABNORMAL HIGH (ref 0.7–3.1)
Lymphs: 90 %
MCH: 30.1 pg (ref 26.6–33.0)
MCHC: 32.7 g/dL (ref 31.5–35.7)
MCV: 92 fL (ref 79–97)
Monocytes Absolute: 0.9 10*3/uL (ref 0.1–0.9)
Monocytes: 2 %
Neutrophils Absolute: 3.6 10*3/uL (ref 1.4–7.0)
Neutrophils: 8 %
RBC: 4.05 x10E6/uL (ref 3.77–5.28)
RDW: 12.7 % (ref 11.7–15.4)
WBC: 47.4 10*3/uL (ref 3.4–10.8)

## 2022-12-13 LAB — LIPID PANEL
Chol/HDL Ratio: 3.2 ratio (ref 0.0–4.4)
Cholesterol, Total: 105 mg/dL (ref 100–199)
HDL: 33 mg/dL — ABNORMAL LOW (ref >39)
LDL Chol Calc (NIH): 52 mg/dL (ref 0–99)
Triglycerides: 103 mg/dL (ref 0–149)
VLDL Cholesterol Cal: 20 mg/dL (ref 5–40)

## 2022-12-13 LAB — CMP14+EGFR
ALT: 10 IU/L (ref 0–32)
AST: 12 IU/L (ref 0–40)
Albumin/Globulin Ratio: 2.1 (ref 1.2–2.2)
Albumin: 4.5 g/dL (ref 3.8–4.8)
Alkaline Phosphatase: 91 IU/L (ref 44–121)
BUN/Creatinine Ratio: 8 — ABNORMAL LOW (ref 12–28)
BUN: 11 mg/dL (ref 8–27)
Bilirubin Total: 0.3 mg/dL (ref 0.0–1.2)
CO2: 24 mmol/L (ref 20–29)
Calcium: 10.4 mg/dL — ABNORMAL HIGH (ref 8.7–10.3)
Chloride: 101 mmol/L (ref 96–106)
Creatinine, Ser: 1.32 mg/dL — ABNORMAL HIGH (ref 0.57–1.00)
Globulin, Total: 2.1 g/dL (ref 1.5–4.5)
Glucose: 105 mg/dL — ABNORMAL HIGH (ref 70–99)
Potassium: 4.3 mmol/L (ref 3.5–5.2)
Sodium: 139 mmol/L (ref 134–144)
Total Protein: 6.6 g/dL (ref 6.0–8.5)
eGFR: 42 mL/min/{1.73_m2} — ABNORMAL LOW (ref >59)

## 2022-12-13 LAB — URINE CULTURE

## 2022-12-18 ENCOUNTER — Ambulatory Visit: Payer: 59 | Admitting: Internal Medicine

## 2022-12-19 ENCOUNTER — Other Ambulatory Visit: Payer: 59

## 2022-12-21 ENCOUNTER — Other Ambulatory Visit: Payer: Self-pay | Admitting: Internal Medicine

## 2022-12-21 ENCOUNTER — Telehealth: Payer: Self-pay | Admitting: Oncology

## 2022-12-21 DIAGNOSIS — N3946 Mixed incontinence: Secondary | ICD-10-CM

## 2022-12-21 NOTE — Telephone Encounter (Signed)
Pt called and stated she saw her PCP and they instructed her to call Dr. Bethanne Ginger office and let them know that her white blood cell count was up. Please advise.  AA

## 2022-12-21 NOTE — Telephone Encounter (Signed)
Per Dr. Cathie Hoops, WBC count incrased due to her not being on her cancer medication. Ok to keep appt in July as scheduled. called pt and infomed her of this.

## 2022-12-22 ENCOUNTER — Ambulatory Visit: Payer: 59 | Admitting: Cardiovascular Disease

## 2022-12-27 ENCOUNTER — Other Ambulatory Visit: Payer: Self-pay | Admitting: Family

## 2023-01-08 ENCOUNTER — Ambulatory Visit (INDEPENDENT_AMBULATORY_CARE_PROVIDER_SITE_OTHER): Payer: 59 | Admitting: Internal Medicine

## 2023-01-08 ENCOUNTER — Encounter: Payer: Self-pay | Admitting: Internal Medicine

## 2023-01-08 VITALS — BP 140/70 | HR 51 | Ht 62.5 in | Wt 147.6 lb

## 2023-01-08 DIAGNOSIS — Z1283 Encounter for screening for malignant neoplasm of skin: Secondary | ICD-10-CM

## 2023-01-08 DIAGNOSIS — E782 Mixed hyperlipidemia: Secondary | ICD-10-CM | POA: Diagnosis not present

## 2023-01-08 DIAGNOSIS — Z87440 Personal history of urinary (tract) infections: Secondary | ICD-10-CM

## 2023-01-08 DIAGNOSIS — Z720 Tobacco use: Secondary | ICD-10-CM | POA: Diagnosis not present

## 2023-01-08 DIAGNOSIS — I10 Essential (primary) hypertension: Secondary | ICD-10-CM | POA: Diagnosis not present

## 2023-01-08 NOTE — Progress Notes (Signed)
Established Patient Office Visit  Subjective:  Patient ID: Karen [name], female    DOB: 1945/09/08  Age: 77 y.o. MRN: [id number]  Chief Complaint  Patient presents with   Follow-up    2 month follow up    Patient in office for one month follow up. Patient prescribed Cipro at previous visit for UTI. States son was in the hospital so she forgot to take her medications. Reports having two pills remaining. Complaints of dysuria, bladder area discomfort have resolved. Patient continues to smoke. Oncology aware of elevated WBCs from recent blood work. Patient requesting a dermatology referral for suspicious lesions on face. Dizziness improved since previous visit.     No other concerns at this time.   Past Medical History:  Diagnosis Date   Anxiety    Artery occlusion    Asthma    Back pain    Bronchitis    Cancer (HCC)    Skin Cancer   CLL (chronic lymphocytic leukemia) (HCC)    Coronary artery disease    Depression    Dyspnea    GERD (gastroesophageal reflux disease)    Herpes    History of kidney stones    Hypercholesteremia    Hyperlipemia    Hypertension    Hypothyroidism    Myocardial infarction (HCC) 1999   Neuropathy    Restless leg syndrome    Sleep apnea    use C-PAP   Wears dentures    full upper, partial lower.  doesn't currently wear    Past Surgical History:  Procedure Laterality Date   COLONOSCOPY WITH PROPOFOL N/A 11/20/2016   Procedure: COLONOSCOPY WITH PROPOFOL;  Surgeon: Midge Minium, MD;  Location: [facility];  Service: Endoscopy;  Laterality: N/A;  sleep apnea   CORONARY ANGIOPLASTY WITH STENT PLACEMENT  2008   DILATION AND CURETTAGE OF UTERUS     ESOPHAGOGASTRODUODENOSCOPY (EGD) WITH PROPOFOL N/A 03/09/2022   Procedure: ESOPHAGOGASTRODUODENOSCOPY (EGD) WITH PROPOFOL;  Surgeon: Midge Minium, MD;  Location: [facility];  Service: Endoscopy;  Laterality: N/A;   EYE SURGERY Bilateral    Cataract Extraction with IOL   FRACTURE SURGERY      HAND SURGERY Left 12/2017   upper hand fracture repair   LEFT HEART CATH AND CORONARY ANGIOGRAPHY N/A 06/29/2017   Procedure: LEFT HEART CATH AND CORONARY ANGIOGRAPHY;  Surgeon: Laurier Nancy, MD;  Location: [facility];  Service: Cardiovascular;  Laterality: N/A;   LEFT HEART CATH AND CORONARY ANGIOGRAPHY Left 12/02/2018   Procedure: LEFT HEART CATH AND CORONARY ANGIOGRAPHY;  Surgeon: Laurier Nancy, MD;  Location: [facility];  Service: Cardiovascular;  Laterality: Left;   OPEN REDUCTION INTERNAL FIXATION (ORIF) DISTAL RADIAL FRACTURE Right 08/12/2018   Procedure: OPEN REDUCTION INTERNAL FIXATION (ORIF) DISTAL RADIAL FRACTURE;  Surgeon: Deeann Saint, MD;  Location: [facility];  Service: Orthopedics;  Laterality: Right;   POLYPECTOMY  11/20/2016   Procedure: POLYPECTOMY;  Surgeon: Midge Minium, MD;  Location: [facility];  Service: Endoscopy;;   TUBAL LIGATION     VAGINAL HYSTERECTOMY     VULVECTOMY Right 01/31/2016   Procedure: WIDE EXCISION VULVECTOMY-RIGHT LABIA;  Surgeon: Herold Harms, MD;  Location: [facility];  Service: Gynecology;  Laterality: Right;    Social History   Socioeconomic History   Marital status: Single    Spouse name: Not on file   Number of children: 5   Years of education: Not on file   Highest education level: Not on file  Occupational  History   Occupation: part time job    Comment: Landscape architect  Tobacco Use   Smoking status: Every Day    Packs/day: 1.00    Years: 35.00    Additional pack years: 0.00    Total pack years: 35.00    Types: Cigarettes   Smokeless tobacco: Current    Types: Snuff  Vaping Use   Vaping Use: Never used  Substance and Sexual Activity   Alcohol use: No    Alcohol/week: 0.0 standard drinks of alcohol   Drug use: No   Sexual activity: Not Currently    Partners: Male    Birth control/protection: Surgical  Other Topics Concern   Not on file  Social History Narrative   Not on file    Social Determinants of Health   Financial Resource Strain: Low Risk  (12/02/2018)   Overall Financial Resource Strain (CARDIA)    Difficulty of Paying Living Expenses: Not very hard  Food Insecurity: No Food Insecurity (12/02/2018)   Hunger Vital Sign    Worried About Running Out of Food in the Last Year: Never true    Ran Out of Food in the Last Year: Never true  Transportation Needs: No Transportation Needs (12/02/2018)   PRAPARE - Administrator, Civil Service (Medical): No    Lack of Transportation (Non-Medical): No  Physical Activity: Inactive (12/18/2017)   Exercise Vital Sign    Days of Exercise per Week: 0 days    Minutes of Exercise per Session: 0 min  Stress: No Stress Concern Present (12/02/2018)   [employer] of Occupational Health - Occupational Stress Questionnaire    Feeling of Stress : Only a little  Social Connections: Unknown (12/02/2018)   Social Connection and Isolation Panel [NHANES]    Frequency of Communication with Friends and Family: More than three times a week    Frequency of Social Gatherings with Friends and Family: Not on file    Attends Religious Services: Not on file    Active Member of Clubs or Organizations: Not on file    Attends Banker Meetings: Not on file    Marital Status: Not on file  Intimate Partner Violence: Not At Risk (12/02/2018)   Humiliation, Afraid, Rape, and Kick questionnaire    Fear of Current or Ex-Partner: No    Emotionally Abused: No    Physically Abused: No    Sexually Abused: No    Family History  Problem Relation Age of Onset   Heart disease Father    Stroke Mother    Hypertension Mother    Lung cancer Brother    Heart attack Brother    Congestive Heart Failure Sister    Cervical cancer Sister    Cancer Neg Hx    Diabetes Neg Hx    Breast cancer Neg Hx     Allergies  Allergen Reactions   Requip [Ropinirole Hcl] Anaphylaxis    Throat closing   Celebrex [Celecoxib] Itching    Meloxicam Palpitations   Nitrofurantoin Palpitations    Review of Systems  Constitutional: Negative.  Negative for chills, fever, malaise/fatigue and weight loss.  HENT: Negative.  Negative for congestion, hearing loss and sore throat.   Eyes: Negative.   Respiratory: Negative.  Negative for cough and shortness of breath.   Cardiovascular: Negative.  Negative for chest pain, claudication, leg swelling and PND.  Gastrointestinal:  Negative for abdominal pain, constipation, heartburn, nausea and vomiting.  Genitourinary: Negative.  Negative for dysuria and urgency.  Musculoskeletal: Negative.  Negative for falls and myalgias.  Skin: Negative.   Neurological: Negative.  Negative for dizziness and headaches.  Endo/Heme/Allergies: Negative.   Psychiatric/Behavioral: Negative.  Negative for depression. The patient is not nervous/anxious.        Objective:   BP (!) 140/70   Pulse (!) 51   Ht 5' 2.5" (1.588 m)   Wt 147 lb 9.6 oz (67 kg)   SpO2 96%   BMI 26.57 kg/m   Vitals:   01/08/23 1347  BP: (!) 140/70  Pulse: (!) 51  Height: 5' 2.5" (1.588 m)  Weight: 147 lb 9.6 oz (67 kg)  SpO2: 96%  BMI (Calculated): 26.55    Physical Exam Vitals and nursing note reviewed.  Constitutional:      General: She is not in acute distress.    Appearance: Normal appearance.  HENT:     Head: Normocephalic and atraumatic.     Nose: Nose normal.     Mouth/Throat:     Mouth: Mucous membranes are moist.     Pharynx: Oropharynx is clear.  Cardiovascular:     Rate and Rhythm: Normal rate and regular rhythm.     Pulses: Normal pulses.     Heart sounds: Normal heart sounds. No murmur heard. Pulmonary:     Effort: Pulmonary effort is normal.     Breath sounds: Normal breath sounds.  Abdominal:     General: Bowel sounds are normal.     Palpations: Abdomen is soft.     Tenderness: There is no right CVA tenderness or left CVA tenderness.  Musculoskeletal:        General: Normal range of  motion.     Cervical back: Normal range of motion.  Skin:    General: Skin is warm and dry.     Findings: Lesion present.  Neurological:     General: No focal deficit present.     Mental Status: She is alert and oriented to person, place, and time.  Psychiatric:        Mood and Affect: Mood normal.        Behavior: Behavior normal.      No results found for any visits on 01/08/23.  Recent Results (from the past 2160 hour(s))  POCT Urinalysis Dipstick (16109)     Status: Abnormal   Collection Time: 12/11/22 12:09 PM  Result Value Ref Range   Color, UA     Clarity, UA     Glucose, UA Negative Negative   Bilirubin, UA Small    Ketones, UA Negative    Spec Grav, UA 1.025 1.010 - 1.025   Blood, UA Negative    pH, UA 6.0 5.0 - 8.0   Protein, UA Positive (A) Negative    Comment: Trace   Urobilinogen, UA 1.0 0.2 or 1.0 E.U./dL   Nitrite, UA Negative    Leukocytes, UA Small (1+) (A) Negative   Appearance     Odor    CBC With Differential     Status: Abnormal   Collection Time: 12/11/22 12:24 PM  Result Value Ref Range   WBC 47.4 (HH) 3.4 - 10.8 x10E3/uL   RBC 4.05 3.77 - 5.28 x10E6/uL   Hemoglobin 12.2 11.1 - 15.9 g/dL   Hematocrit 60.4 54.0 - 46.6 %   MCV 92 79 - 97 fL   MCH 30.1 26.6 - 33.0 pg   MCHC 32.7 31.5 - 35.7 g/dL   RDW 98.1 19.1 - 47.8 %   Neutrophils 8 Not Estab. %  Lymphs 90 Not Estab. %    Comment: Smudge cells present Atypical lymphocytes.    Monocytes 2 Not Estab. %   Eos 0 Not Estab. %   Basos 0 Not Estab. %   Neutrophils Absolute 3.6 1.4 - 7.0 x10E3/uL   Lymphocytes Absolute 42.7 (H) 0.7 - 3.1 x10E3/uL   Monocytes Absolute 0.9 0.1 - 0.9 x10E3/uL   EOS (ABSOLUTE) 0.2 0.0 - 0.4 x10E3/uL   Basophils Absolute 0.0 0.0 - 0.2 x10E3/uL   Immature Granulocytes 0 Not Estab. %   Immature Grans (Abs) 0.0 0.0 - 0.1 x10E3/uL   Hematology Comments: Note:     Comment: Verified by microscopic examination.  CMP14+EGFR     Status: Abnormal   Collection Time:  12/11/22 12:24 PM  Result Value Ref Range   Glucose 105 (H) 70 - 99 mg/dL   BUN 11 8 - 27 mg/dL   Creatinine, Ser 4.09 (H) 0.57 - 1.00 mg/dL   eGFR 42 (L) >81 XB/JYN/8.29   BUN/Creatinine Ratio 8 (L) 12 - 28   Sodium 139 134 - 144 mmol/L   Potassium 4.3 3.5 - 5.2 mmol/L   Chloride 101 96 - 106 mmol/L   CO2 24 20 - 29 mmol/L   Calcium 10.4 (H) 8.7 - 10.3 mg/dL   Total Protein 6.6 6.0 - 8.5 g/dL   Albumin 4.5 3.8 - 4.8 g/dL   Globulin, Total 2.1 1.5 - 4.5 g/dL   Albumin/Globulin Ratio 2.1 1.2 - 2.2   Bilirubin Total 0.3 0.0 - 1.2 mg/dL   Alkaline Phosphatase 91 44 - 121 IU/L   AST 12 0 - 40 IU/L   ALT 10 0 - 32 IU/L  Lipid Profile     Status: Abnormal   Collection Time: 12/11/22 12:24 PM  Result Value Ref Range   Cholesterol, Total 105 100 - 199 mg/dL   Triglycerides 562 0 - 149 mg/dL   HDL 33 (L) >13 mg/dL   VLDL Cholesterol Cal 20 5 - 40 mg/dL   LDL Chol Calc (NIH) 52 0 - 99 mg/dL   Chol/HDL Ratio 3.2 0.0 - 4.4 ratio    Comment:                                   T. Chol/HDL Ratio                                             Men  Women                               1/2 Avg.Risk  3.4    3.3                                   Avg.Risk  5.0    4.4                                2X Avg.Risk  9.6    7.1                                3X  Avg.Risk 23.4   11.0   Urine Culture     Status: Abnormal   Collection Time: 12/11/22  1:05 PM   Specimen: Urine   CU  Result Value Ref Range   Urine Culture, Routine Final report (A)    Organism ID, Bacteria Klebsiella pneumoniae (A)     Comment: Cefazolin <=4 ug/mL Cefazolin with an MIC <=16 predicts susceptibility to the oral agents cefaclor, cefdinir, cefpodoxime, cefprozil, cefuroxime, cephalexin, and loracarbef when used for therapy of uncomplicated urinary tract infections due to E. coli, Klebsiella pneumoniae, and Proteus mirabilis. Greater than 100,000 colony forming units per mL    Antimicrobial Susceptibility Comment     Comment:        ** S = Susceptible; I = Intermediate; R = Resistant **                    P = Positive; N = Negative             MICS are expressed in micrograms per mL    Antibiotic                 RSLT#1    RSLT#2    RSLT#3    RSLT#4 Amoxicillin/Clavulanic Acid    S Ampicillin                     R Cefepime                       S Ceftriaxone                    S Cefuroxime                     S Ciprofloxacin                  S Ertapenem                      S Gentamicin                     S Imipenem                       S Levofloxacin                   S Meropenem                      S Nitrofurantoin                 S Piperacillin/Tazobactam        S Tetracycline                   S Tobramycin                     S Trimethoprim/Sulfa             S       Assessment & Plan:  Patient feeling ok. Discussed importance of finishing antibiotic as prescribed. Discussed smoking cessation, patient does not want to quit. Will refer to dermatology for suspicious lesions on face. Kidney function worse on most recent labs, recommend drinking more water, less soda. Work on diet and exercise.   Problem List Items Addressed This Visit     History of UTI   Tobacco abuse   Skin cancer screening -  Primary   Relevant Orders   Ambulatory referral to Dermatology   Essential hypertension, benign   Mixed hyperlipidemia    Return in about 3 months (around 04/10/2023).   Total time spent: 25 minutes  Margaretann Loveless, MD  01/08/2023   This document may have been prepared by [facility] Voice Recognition software and as such may include unintentional dictation errors.

## 2023-01-18 ENCOUNTER — Ambulatory Visit
Admission: RE | Admit: 2023-01-18 | Discharge: 2023-01-18 | Disposition: A | Discharge status: Home / Self Care | Payer: 59 | Source: Ambulatory Visit | Attending: Oncology | Admitting: Oncology

## 2023-01-18 DIAGNOSIS — C9110 Chronic lymphocytic leukemia of B-cell type not having achieved remission: Secondary | ICD-10-CM | POA: Insufficient documentation

## 2023-01-18 DIAGNOSIS — R591 Generalized enlarged lymph nodes: Secondary | ICD-10-CM | POA: Diagnosis not present

## 2023-01-26 ENCOUNTER — Other Ambulatory Visit (HOSPITAL_COMMUNITY): Payer: Self-pay

## 2023-01-29 ENCOUNTER — Inpatient Hospital Stay: Payer: 59 | Attending: Oncology

## 2023-01-29 ENCOUNTER — Encounter: Payer: Self-pay | Admitting: Oncology

## 2023-01-29 ENCOUNTER — Inpatient Hospital Stay (HOSPITAL_BASED_OUTPATIENT_CLINIC_OR_DEPARTMENT_OTHER): Payer: 59 | Admitting: Oncology

## 2023-01-29 VITALS — BP 157/59 | HR 45 | Temp 96.1°F | Resp 18 | Wt 147.4 lb

## 2023-01-29 DIAGNOSIS — R1900 Intra-abdominal and pelvic swelling, mass and lump, unspecified site: Secondary | ICD-10-CM | POA: Diagnosis not present

## 2023-01-29 DIAGNOSIS — C9110 Chronic lymphocytic leukemia of B-cell type not having achieved remission: Secondary | ICD-10-CM | POA: Insufficient documentation

## 2023-01-29 DIAGNOSIS — N183 Chronic kidney disease, stage 3 unspecified: Secondary | ICD-10-CM | POA: Insufficient documentation

## 2023-01-29 DIAGNOSIS — F17210 Nicotine dependence, cigarettes, uncomplicated: Secondary | ICD-10-CM | POA: Insufficient documentation

## 2023-01-29 DIAGNOSIS — K209 Esophagitis, unspecified without bleeding: Secondary | ICD-10-CM | POA: Diagnosis not present

## 2023-01-29 DIAGNOSIS — N1831 Chronic kidney disease, stage 3a: Secondary | ICD-10-CM

## 2023-01-29 DIAGNOSIS — I129 Hypertensive chronic kidney disease with stage 1 through stage 4 chronic kidney disease, or unspecified chronic kidney disease: Secondary | ICD-10-CM | POA: Diagnosis not present

## 2023-01-29 LAB — CBC WITH DIFFERENTIAL (CANCER CENTER ONLY)
Abs Immature Granulocytes: 0.08 10*3/uL — ABNORMAL HIGH (ref 0.00–0.07)
Basophils Absolute: 0.1 10*3/uL (ref 0.0–0.1)
Basophils Relative: 0 %
Eosinophils Absolute: 0.2 10*3/uL (ref 0.0–0.5)
Eosinophils Relative: 0 %
HCT: 35.1 % — ABNORMAL LOW (ref 36.0–46.0)
Hemoglobin: 11.2 g/dL — ABNORMAL LOW (ref 12.0–15.0)
Immature Granulocytes: 0 %
Lymphocytes Relative: 86 %
Lymphs Abs: 33.5 10*3/uL — ABNORMAL HIGH (ref 0.7–4.0)
MCH: 30.4 pg (ref 26.0–34.0)
MCHC: 31.9 g/dL (ref 30.0–36.0)
MCV: 95.4 fL (ref 80.0–100.0)
Monocytes Absolute: 1.2 10*3/uL — ABNORMAL HIGH (ref 0.1–1.0)
Monocytes Relative: 3 %
Neutro Abs: 4.5 10*3/uL (ref 1.7–7.7)
Neutrophils Relative %: 11 %
Platelet Count: 160 10*3/uL (ref 150–400)
RBC: 3.68 MIL/uL — ABNORMAL LOW (ref 3.87–5.11)
RDW: 13.3 % (ref 11.5–15.5)
Smear Review: NORMAL
WBC Count: 39.5 10*3/uL — ABNORMAL HIGH (ref 4.0–10.5)
nRBC: 0.1 % (ref 0.0–0.2)

## 2023-01-29 LAB — LACTATE DEHYDROGENASE: LDH: 106 U/L (ref 98–192)

## 2023-01-29 LAB — CMP (CANCER CENTER ONLY)
ALT: 10 U/L (ref 0–44)
AST: 12 U/L — ABNORMAL LOW (ref 15–41)
Albumin: 4 g/dL (ref 3.5–5.0)
Alkaline Phosphatase: 64 U/L (ref 38–126)
Anion gap: 6 (ref 5–15)
BUN: 10 mg/dL (ref 8–23)
CO2: 26 mmol/L (ref 22–32)
Calcium: 9.5 mg/dL (ref 8.9–10.3)
Chloride: 101 mmol/L (ref 98–111)
Creatinine: 1.24 mg/dL — ABNORMAL HIGH (ref 0.44–1.00)
GFR, Estimated: 45 mL/min — ABNORMAL LOW (ref >60)
Glucose, Bld: 100 mg/dL — ABNORMAL HIGH (ref 70–99)
Potassium: 4 mmol/L (ref 3.5–5.1)
Sodium: 133 mmol/L — ABNORMAL LOW (ref 135–145)
Total Bilirubin: 0.5 mg/dL (ref 0.3–1.2)
Total Protein: 6.5 g/dL (ref 6.5–8.1)

## 2023-01-29 NOTE — Assessment & Plan Note (Signed)
Encourage oral hydration. Avoid nephrotoxins. 

## 2023-01-29 NOTE — Assessment & Plan Note (Signed)
Recent images indicate ovarian origin. Normal CA 125 and elevated HE4.  Patient was seen by gynecology oncology Dr. Johnnette Litter.  Given that the mass has been stable for more than 6 years, Dr. Johnnette Litter recommends observation. Mass is slightly increased in size. Repeat CT in 4 months.

## 2023-01-29 NOTE — Progress Notes (Signed)
Hematology/Oncology Progress note [phone] [phone]        REASON FOR VISIT Follow up for treatment of CLL  ASSESSMENT & PLAN:   Cancer Staging  CLL (chronic lymphocytic leukemia) (HCC) Staging form: Chronic Lymphocytic Leukemia / Small Lymphocytic Lymphoma, AJCC 8th Edition - Clinical: Modified Rai Stage I (Modified Rai risk: Intermediate, Lymphocytosis: Present, Adenopathy: Present, Organomegaly: Absent, Anemia: Absent, Thrombocytopenia: Absent) - Signed by [name], MD on 12/06/2021   CLL (chronic lymphocytic leukemia) (HCC) CLL  IgVH Somatic Hypermutation was not detected, 13Q del,  Progressive intra-abdominal lymphadenopathy, She is not complaint with her medication.  Repeat PET scan findings were reviewed with patient.  Slightly increase of abdominal lymphadenopathy.  Previously I have advised patient to resume Zanubrutinib  and she was not compliant. CT showed some progression of her abdominopelvic adenopathy We discussed about the option of resuming treatment vs continue close monitor with CT scan.  Patient prefers to be observed. She is asymptomatic at this point. No constitution symptoms and lymphadenopathy has not threaten any organ function.  I think it is reasonable to continue observation. Repeat CT in 4 months.   Pelvic mass Recent images indicate ovarian origin. Normal CA 125 and elevated HE4.  Patient was seen by gynecology oncology Dr. Johnnette Litter.  Given that the mass has been stable for more than 6 years, Dr. Johnnette Litter recommends observation. Mass is slightly increased in size. Repeat CT in 4 months.   Esophagitis Continue PPI She reports improvement of symptoms.  CKD (chronic kidney disease) stage 3, GFR 30-59 ml/min (HCC) Encourage oral hydration.  Avoid nephrotoxins.  Orders Placed This Encounter  Procedures   CT Abdomen Pelvis Wo Contrast    Standing Status:   Future    Standing Expiration Date:   01/29/2024    Order Specific  Question:   Preferred imaging location?    Answer:   [facility]    Order Specific Question:   If indicated for the ordered procedure, I authorize the administration of oral contrast media per Radiology protocol    Answer:   Yes    Order Specific Question:   Does the patient have a contrast media/X-ray dye allergy?    Answer:   No   CMP (Cancer Center only)    Standing Status:   Future    Standing Expiration Date:   01/29/2024   CBC with Differential (Cancer Center Only)    Standing Status:   Future    Standing Expiration Date:   01/29/2024   Lactate dehydrogenase    Standing Status:   Future    Standing Expiration Date:   01/29/2024    Follow up in 3 months.   [name], MD, PhD [facility] Hematology Oncology 01/29/2023       HISTORY OF PRESENTING ILLNESS:  Karen [name] is a  77 y.o.  female with PMH listed below who presents for follow-up of CLL treatments. Oncology history summary listed as below. Oncology History  CLL (chronic lymphocytic leukemia) (HCC)  09/24/2015 Imaging   10/14/2015 chest without contrast Images were independently reviewed by me Small pulmonary nodules are stable over the past year.  Considered benign.  Several lymph nodes are borderline enlarged, including the lower right paratracheal lymph node and a portacaval lymph node. 08/27/2017 US abdomen complete, no hepato-splenomegaly.   12/18/2017 Initial Diagnosis   CLL (chronic lymphocytic leukemia)   07/30/2017, peripheral blood flow cytometry showed CD5+, CD23+, CD38 negative monoclonal B-cell population consistent with a diagnosis of CLL  06/27/2019 Cancer Staging   Staging form: Chronic Lymphocytic Leukemia / Small Lymphocytic Lymphoma, AJCC 8th Edition - Clinical: Modified Rai Stage I (Modified Rai risk: Intermediate, Lymphocytosis: Present, Adenopathy: Present, Organomegaly: Absent, Anemia: Absent, Thrombocytopenia: Absent) - Signed by [name], MD on 12/06/2021   11/27/2021 Imaging   Outside facility  CT Aorta and run off w wo  showed moderate atherosclerotic aortic disease.  Diffuse lightest of moderate disease of the bilateral Udenyca vasculature.Diffuse mild stenotic disease of the bilateral SFAs. In addition, patient has soft tissue mass/lymph node along the left pelvic sidewall measures 4.9 x 4.7 cm, numerous peritoneum subcu tissue nodule, a deep right pelvic nodule and numerous enlarged mesenteric and retroperitoneal lymph nodes are identified.  For example a right mesenteric lymph node measures 2.4 x 2.2 cm.  The left periaortic lymph node measures 4.4 x 2.0 cm.  A pericardial lymph node measures 2.6 x 1.3 cm.   01/03/2022 Pathology Results   Right abdomen lymph node biopsy showed chronic lymphocytic leukemia/small lymphocytic lymphoma    01/09/2022 - 02/06/2022 Chemotherapy   Acalabrutinib 100 mg twice daily  Stopped due to sensation of throat closing.   03/06/2022 -  Chemotherapy   Zanubrutinib, not able to swallow due to esophagitis   03/09/2022 - 03/12/2022 Hospital Admission   Patient reports that she took 1 day of zanubrutinib around 03/06/2022.  She has had difficulty swallowing the pills. She went to emergency room on 03/09/2022 for evaluation of dysphagia symptoms.  Symptoms occurs with both liquids and solids.  Also midsternal chest pain.  She was found to have hypertension emergency with systolic blood pressure greater than 200.  Patient states that she is being compliant with her blood pressure pills. Patient was treated with hydralazine, Cardizem drip.  Blood pressure improves. During the admission, she has had a EGD done by Dr. Servando Snare which showed esophagitis.  No biopsy was taken due to patient being on anticoagulation (aspirin, Plavix, low-dose Xarelto 2.5 mg twice daily] plan is for patient to follow-up with Dr. Norma Fredrickson outpatient for repeat EGD while off anticoagulation.  Her PPI dose was increased. She was discharged on metoprolol, lisinopril, amlodipine and nitrates    03/09/2022 Imaging   CT angio chest/abdomen/pelvis for dissection 1. No evidence for thoracic or abdominal aortic aneurysm or dissection. 2. Lymphadenopathy is identified in the chest, abdomen, and pelvis in this patient with reported history of CLL. Mediastinal lymphadenopathy is similar to prior chest CT of 2017. A hepatoduodenal ligament lymph node included on that prior chest CT has increased in size in the interval. Additional abdominopelvic lymphadenopathy was not included on available prior imaging. Close follow-up recommended and comparison to old studies suggested to assess for progression. 3. Left pelvic soft tissue mass measures 5.5 x 4.4 cm. Left gonadal veins appear to arise directly from this lesion suggesting ovarian etiology. Pelvic ultrasound may prove helpful to further evaluate. 4. Bilateral renal lesions with attenuation too high to be simple cysts. These may be cysts complicated by proteinaceous debris or hemorrhage, but MRI abdomen with and without contrast recommended to confirm. 5. Diffuse colonic diverticulosis without diverticulitis.6. Stable appearance of small right-sided pulmonary nodules,consistent with benign etiology.7. Aortic Atherosclerosis (ICD10-I70.0).   03/09/2022 Imaging   Pelvic ultrasound showed 1. 5.1 cm solid soft tissue mass in the left adnexa likely representing left ovarian origin. Given that this patient has a reported history of CLL, there is likely prior imaging available outside are system. Consider comparison to previous outside studies to assess chronicity of this  finding.2. No evidence for ovarian torsion.3. Status post hysterectomy.    03/09/22 EGD showed Non-severe non-erosive esophagitis with no bleeding INTERVAL HISTORY Karen [name] is a 77 y.o. female who has above history reviewed by me today presents for follow up visit for CLL  She take PPI. She does not take her BP medication routinely.  Currently off Zanubrutinib.  Denies weight  loss, fever, chills, fatigue, night sweats.  Chronic back and hip pain.     Review of Systems  Constitutional:  Positive for fatigue. Negative for appetite change, chills, fever and unexpected weight change.  HENT:   Negative for hearing loss and voice change.   Eyes:  Negative for eye problems.  Respiratory:  Negative for chest tightness and cough.   Cardiovascular:  Negative for chest pain.  Gastrointestinal:  Negative for abdominal distention, abdominal pain, blood in stool and nausea.  Endocrine: Negative for hot flashes.  Genitourinary:  Negative for difficulty urinating and frequency.   Musculoskeletal:  Positive for back pain. Negative for arthralgias.  Skin:  Negative for itching and rash.  Neurological:  Negative for extremity weakness and headaches.  Hematological:  Negative for adenopathy.  Psychiatric/Behavioral:  Negative for confusion.      MEDICAL HISTORY:  Past Medical History:  Diagnosis Date   Anxiety    Artery occlusion    Asthma    Back pain    Bronchitis    Cancer (HCC)    Skin Cancer   CLL (chronic lymphocytic leukemia) (HCC)    Coronary artery disease    Depression    Dyspnea    GERD (gastroesophageal reflux disease)    Herpes    History of kidney stones    Hypercholesteremia    Hyperlipemia    Hypertension    Hypothyroidism    Myocardial infarction (HCC) 1999   Neuropathy    Restless leg syndrome    Sleep apnea    use C-PAP   Wears dentures    full upper, partial lower.  doesn't currently wear    SURGICAL HISTORY: Past Surgical History:  Procedure Laterality Date   COLONOSCOPY WITH PROPOFOL N/A 11/20/2016   Procedure: COLONOSCOPY WITH PROPOFOL;  Surgeon: Midge Minium, MD;  Location: [facility];  Service: Endoscopy;  Laterality: N/A;  sleep apnea   CORONARY ANGIOPLASTY WITH STENT PLACEMENT  2008   DILATION AND CURETTAGE OF UTERUS     ESOPHAGOGASTRODUODENOSCOPY (EGD) WITH PROPOFOL N/A 03/09/2022   Procedure:  ESOPHAGOGASTRODUODENOSCOPY (EGD) WITH PROPOFOL;  Surgeon: Midge Minium, MD;  Location: [facility];  Service: Endoscopy;  Laterality: N/A;   EYE SURGERY Bilateral    Cataract Extraction with IOL   FRACTURE SURGERY     HAND SURGERY Left 12/2017   upper hand fracture repair   LEFT HEART CATH AND CORONARY ANGIOGRAPHY N/A 06/29/2017   Procedure: LEFT HEART CATH AND CORONARY ANGIOGRAPHY;  Surgeon: Laurier Nancy, MD;  Location: [facility];  Service: Cardiovascular;  Laterality: N/A;   LEFT HEART CATH AND CORONARY ANGIOGRAPHY Left 12/02/2018   Procedure: LEFT HEART CATH AND CORONARY ANGIOGRAPHY;  Surgeon: Laurier Nancy, MD;  Location: [facility];  Service: Cardiovascular;  Laterality: Left;   OPEN REDUCTION INTERNAL FIXATION (ORIF) DISTAL RADIAL FRACTURE Right 08/12/2018   Procedure: OPEN REDUCTION INTERNAL FIXATION (ORIF) DISTAL RADIAL FRACTURE;  Surgeon: Deeann Saint, MD;  Location: [facility];  Service: Orthopedics;  Laterality: Right;   POLYPECTOMY  11/20/2016   Procedure: POLYPECTOMY;  Surgeon: Midge Minium, MD;  Location: [facility];  Service: Endoscopy;;   TUBAL LIGATION     VAGINAL HYSTERECTOMY     VULVECTOMY Right 01/31/2016   Procedure: WIDE EXCISION VULVECTOMY-RIGHT LABIA;  Surgeon: Herold Harms, MD;  Location: [facility];  Service: Gynecology;  Laterality: Right;    SOCIAL HISTORY: Social History   Socioeconomic History   Marital status: Single    Spouse name: Not on file   Number of children: 5   Years of education: Not on file   Highest education level: Not on file  Occupational History   Occupation: part time job    Comment: Landscape architect  Tobacco Use   Smoking status: Every Day    Packs/day: 1.00    Years: 35.00    Additional pack years: 0.00    Total pack years: 35.00    Types: Cigarettes   Smokeless tobacco: Current    Types: Snuff  Vaping Use   Vaping Use: Never used  Substance and Sexual Activity   Alcohol use: No     Alcohol/week: 0.0 standard drinks of alcohol   Drug use: No   Sexual activity: Not Currently    Partners: Male    Birth control/protection: Surgical  Other Topics Concern   Not on file  Social History Narrative   Not on file   Social Determinants of Health   Financial Resource Strain: Low Risk  (12/02/2018)   Overall Financial Resource Strain (CARDIA)    Difficulty of Paying Living Expenses: Not very hard  Food Insecurity: No Food Insecurity (12/02/2018)   Hunger Vital Sign    Worried About Running Out of Food in the Last Year: Never true    Ran Out of Food in the Last Year: Never true  Transportation Needs: No Transportation Needs (12/02/2018)   PRAPARE - Administrator, Civil Service (Medical): No    Lack of Transportation (Non-Medical): No  Physical Activity: Inactive (12/18/2017)   Exercise Vital Sign    Days of Exercise per Week: 0 days    Minutes of Exercise per Session: 0 min  Stress: No Stress Concern Present (12/02/2018)   [employer] of Occupational Health - Occupational Stress Questionnaire    Feeling of Stress : Only a little  Social Connections: Unknown (12/02/2018)   Social Connection and Isolation Panel [NHANES]    Frequency of Communication with Friends and Family: More than three times a week    Frequency of Social Gatherings with Friends and Family: Not on file    Attends Religious Services: Not on file    Active Member of Clubs or Organizations: Not on file    Attends Banker Meetings: Not on file    Marital Status: Not on file  Intimate Partner Violence: Not At Risk (12/02/2018)   Humiliation, Afraid, Rape, and Kick questionnaire    Fear of Current or Ex-Partner: No    Emotionally Abused: No    Physically Abused: No    Sexually Abused: No    FAMILY HISTORY: Family History  Problem Relation Age of Onset   Heart disease Father    Stroke Mother    Hypertension Mother    Lung cancer Brother    Heart attack Brother     Congestive Heart Failure Sister    Cervical cancer Sister    Cancer Neg Hx    Diabetes Neg Hx    Breast cancer Neg Hx     ALLERGIES:  is allergic to requip [ropinirole hcl], celebrex [celecoxib], meloxicam, and nitrofurantoin.  MEDICATIONS:  Current Outpatient  Medications  Medication Sig Dispense Refill   acetaminophen (TYLENOL) 500 MG tablet Take 500-1,000 mg by mouth every 6 (six) hours as needed for moderate pain or fever.     albuterol (VENTOLIN HFA) 108 (90 Base) MCG/ACT inhaler Inhale 1-2 puffs into the lungs every 6 (six) hours as needed for wheezing or shortness of breath.     amLODipine (NORVASC) 5 MG tablet TAKE 1 TABLET BY MOUTH EVERY DAY 90 tablet 1   aspirin EC 81 MG tablet Take 1 tablet (81 mg total) by mouth daily. 30 tablet    atorvastatin (LIPITOR) 40 MG tablet Take 1 tablet (40 mg total) by mouth daily. 30 tablet 2   buPROPion (WELLBUTRIN XL) 300 MG 24 hr tablet Take 300 mg by mouth daily.     clopidogrel (PLAVIX) 75 MG tablet TAKE 1 TABLET BY MOUTH EVERY DAY 90 tablet 1   isosorbide mononitrate (IMDUR) 120 MG 24 hr tablet TAKE 1 TABLET BY MOUTH EVERY DAY 90 tablet 3   lisinopril (ZESTRIL) 20 MG tablet Take 20 mg by mouth daily.     lisinopril-hydrochlorothiazide (ZESTORETIC) 20-25 MG tablet Take 1 tablet by mouth daily.     meclizine (ANTIVERT) 25 MG tablet Take 1 tablet (25 mg total) by mouth 3 (three) times daily as needed for dizziness. 30 tablet 2   metoprolol succinate (TOPROL-XL) 50 MG 24 hr tablet Take 100 mg by mouth daily.     ondansetron (ZOFRAN) 4 MG tablet Take 1 tablet (4 mg total) by mouth every 8 (eight) hours as needed for nausea or vomiting. 60 tablet 1   oxybutynin (DITROPAN-XL) 10 MG 24 hr tablet TAKE 1 TABLET BY MOUTH EVERY DAY 90 tablet 3   pantoprazole (PROTONIX) 40 MG tablet TAKE 1 TABLET BY MOUTH TWICE A DAY 180 tablet 1   PREMARIN vaginal cream USE 1 GRAM INTRAVAGINALLY TWICE PER WEEK 30 g 3   traZODone (DESYREL) 50 MG tablet TAKE 2 TABLETS BY  MOUTH AT BEDTIME. 180 tablet 1   zanubrutinib (BRUKINSA) 80 MG capsule Take 2 capsules (160 mg total) by mouth 2 (two) times daily. 120 capsule 1   nitroGLYCERIN (NITROSTAT) 0.4 MG SL tablet Place 1 tablet (0.4 mg total) under the tongue every 5 (five) minutes as needed for chest pain. (Patient not taking: Reported on 01/29/2023) 30 tablet 3   No current facility-administered medications for this visit.     PHYSICAL EXAMINATION: ECOG PERFORMANCE STATUS: 1 - Symptomatic but completely ambulatory Vitals:   01/29/23 1337  BP: (!) 157/59  Pulse: (!) 45  Resp: 18  Temp: (!) 96.1 F (35.6 C)   Filed Weights   01/29/23 1337  Weight: 147 lb 6.4 oz (66.9 kg)    Physical Exam Constitutional:      General: She is not in acute distress.    Appearance: She is not diaphoretic.     Comments: Patient walks with a cane.  HENT:     Head: Normocephalic.  Eyes:     General: No scleral icterus.       Left eye: No discharge.  Neck:     Vascular: No JVD.  Cardiovascular:     Rate and Rhythm: Normal rate.  Pulmonary:     Effort: Pulmonary effort is normal. No respiratory distress.  Abdominal:     General: Bowel sounds are normal. There is no distension.     Palpations: Abdomen is soft.  Musculoskeletal:        General: Normal range of motion.  Cervical back: Normal range of motion.  Skin:    General: Skin is warm and dry.  Neurological:     Mental Status: She is alert and oriented to person, place, and time. Mental status is at baseline.     Motor: No abnormal muscle tone.  Psychiatric:        Mood and Affect: Mood and affect normal.      LABORATORY DATA:  I have reviewed the data as listed    Latest Ref Rng & Units 01/29/2023    1:18 PM 12/11/2022   12:24 PM 08/02/2022    3:00 PM  CBC  WBC 4.0 - 10.5 K/uL 39.5  47.4  36.2   Hemoglobin 12.0 - 15.0 g/dL 19.1  47.8  29.5   Hematocrit 36.0 - 46.0 % 35.1  37.3  36.9   Platelets 150 - 400 K/uL 160   243       Latest Ref Rng &  Units 01/29/2023    1:18 PM 12/11/2022   12:24 PM 08/02/2022    3:00 PM  CMP  Glucose 70 - 99 mg/dL 621  308  92   BUN 8 - 23 mg/dL 10  11  12    Creatinine 0.44 - 1.00 mg/dL 6.57  8.46  9.62   Sodium 135 - 145 mmol/L 133  139  138   Potassium 3.5 - 5.1 mmol/L 4.0  4.3  3.9   Chloride 98 - 111 mmol/L 101  101  103   CO2 22 - 32 mmol/L 26  24  27    Calcium 8.9 - 10.3 mg/dL 9.5  95.2  9.7   Total Protein 6.5 - 8.1 g/dL 6.5  6.6  6.9   Total Bilirubin 0.3 - 1.2 mg/dL 0.5  0.3  0.2   Alkaline Phos 38 - 126 U/L 64  91  60   AST 15 - 41 U/L 12  12  14    ALT 0 - 44 U/L 10  10  11

## 2023-01-29 NOTE — Assessment & Plan Note (Signed)
Continue PPI She reports improvement of symptoms. 

## 2023-01-29 NOTE — Assessment & Plan Note (Addendum)
CLL  IgVH Somatic Hypermutation was not detected, 13Q del,  Progressive intra-abdominal lymphadenopathy, She is not complaint with her medication.  Repeat PET scan findings were reviewed with patient.  Slightly increase of abdominal lymphadenopathy.  Previously I have advised patient to resume Zanubrutinib  and she was not compliant. CT showed some progression of her abdominopelvic adenopathy We discussed about the option of resuming treatment vs continue close monitor with CT scan.  Patient prefers to be observed. She is asymptomatic at this point. No constitution symptoms and lymphadenopathy has not threaten any organ function.  I think it is reasonable to continue observation. Repeat CT in 4 months.

## 2023-01-30 ENCOUNTER — Other Ambulatory Visit: Payer: Self-pay

## 2023-02-17 ENCOUNTER — Other Ambulatory Visit: Payer: Self-pay | Admitting: Cardiovascular Disease

## 2023-03-19 ENCOUNTER — Other Ambulatory Visit: Payer: Self-pay | Admitting: Internal Medicine

## 2023-03-19 DIAGNOSIS — Z1231 Encounter for screening mammogram for malignant neoplasm of breast: Secondary | ICD-10-CM

## 2023-03-21 DIAGNOSIS — D0439 Carcinoma in situ of skin of other parts of face: Secondary | ICD-10-CM | POA: Diagnosis not present

## 2023-03-21 DIAGNOSIS — D485 Neoplasm of uncertain behavior of skin: Secondary | ICD-10-CM | POA: Diagnosis not present

## 2023-03-21 DIAGNOSIS — L570 Actinic keratosis: Secondary | ICD-10-CM | POA: Diagnosis not present

## 2023-03-21 DIAGNOSIS — C44321 Squamous cell carcinoma of skin of nose: Secondary | ICD-10-CM | POA: Diagnosis not present

## 2023-04-10 ENCOUNTER — Ambulatory Visit: Payer: 59 | Admitting: Internal Medicine

## 2023-04-16 ENCOUNTER — Inpatient Hospital Stay: Admission: RE | Admit: 2023-04-16 | Payer: 59 | Source: Ambulatory Visit

## 2023-04-23 ENCOUNTER — Encounter: Payer: Self-pay | Admitting: Internal Medicine

## 2023-04-23 ENCOUNTER — Ambulatory Visit (INDEPENDENT_AMBULATORY_CARE_PROVIDER_SITE_OTHER): Payer: 59 | Admitting: Internal Medicine

## 2023-04-23 VITALS — BP 142/86 | HR 52 | Ht 62.0 in | Wt 146.6 lb

## 2023-04-23 DIAGNOSIS — N3001 Acute cystitis with hematuria: Secondary | ICD-10-CM | POA: Diagnosis not present

## 2023-04-23 DIAGNOSIS — R300 Dysuria: Secondary | ICD-10-CM

## 2023-04-23 DIAGNOSIS — C9110 Chronic lymphocytic leukemia of B-cell type not having achieved remission: Secondary | ICD-10-CM | POA: Diagnosis not present

## 2023-04-23 DIAGNOSIS — E782 Mixed hyperlipidemia: Secondary | ICD-10-CM | POA: Diagnosis not present

## 2023-04-23 DIAGNOSIS — I10 Essential (primary) hypertension: Secondary | ICD-10-CM | POA: Diagnosis not present

## 2023-04-23 LAB — POCT URINALYSIS DIPSTICK
Bilirubin, UA: NEGATIVE
Glucose, UA: NEGATIVE
Ketones, UA: NEGATIVE
Nitrite, UA: POSITIVE
Protein, UA: POSITIVE — AB
Spec Grav, UA: 1.02 (ref 1.010–1.025)
Urobilinogen, UA: 0.2 U/dL
pH, UA: 6 (ref 5.0–8.0)

## 2023-04-23 MED ORDER — AMOXICILLIN-POT CLAVULANATE 875-125 MG PO TABS
1.0000 | ORAL_TABLET | Freq: Two times a day (BID) | ORAL | 0 refills | Status: AC
Start: 2023-04-23 — End: 2023-04-30

## 2023-04-23 NOTE — Progress Notes (Signed)
Established Patient Office Visit  Subjective:  Patient ID: Karen [name], female    DOB: April 03, 1946  Age: 77 y.o. MRN: [id number]  Chief Complaint  Patient presents with   Abdominal Pain    Lower abdominal pain    Patient comes in with 2-day history of bladder area pain and pressure.  Reports of painful micturition and increased frequency.  Mentions mild nausea but no vomiting and no fevers or chills.  No flank pain. Urine dipstick in the office is positive for nitrites and shows large amount of pus cells. Will empirically start Augmentin and send the urine specimen to the lab for culture and sensitivity.    No other concerns at this time.   Past Medical History:  Diagnosis Date   Anxiety    Artery occlusion    Asthma    Back pain    Bronchitis    Cancer (HCC)    Skin Cancer   CLL (chronic lymphocytic leukemia) (HCC)    Coronary artery disease    Depression    Dyspnea    GERD (gastroesophageal reflux disease)    Herpes    History of kidney stones    Hypercholesteremia    Hyperlipemia    Hypertension    Hypothyroidism    Myocardial infarction (HCC) 1999   Neuropathy    Restless leg syndrome    Sleep apnea    use C-PAP   Wears dentures    full upper, partial lower.  doesn't currently wear    Past Surgical History:  Procedure Laterality Date   COLONOSCOPY WITH PROPOFOL N/A 11/20/2016   Procedure: COLONOSCOPY WITH PROPOFOL;  Surgeon: Midge Minium, MD;  Location: [facility];  Service: Endoscopy;  Laterality: N/A;  sleep apnea   CORONARY ANGIOPLASTY WITH STENT PLACEMENT  2008   DILATION AND CURETTAGE OF UTERUS     ESOPHAGOGASTRODUODENOSCOPY (EGD) WITH PROPOFOL N/A 03/09/2022   Procedure: ESOPHAGOGASTRODUODENOSCOPY (EGD) WITH PROPOFOL;  Surgeon: Midge Minium, MD;  Location: [facility];  Service: Endoscopy;  Laterality: N/A;   EYE SURGERY Bilateral    Cataract Extraction with IOL   FRACTURE SURGERY     HAND SURGERY Left 12/2017   upper hand fracture  repair   LEFT HEART CATH AND CORONARY ANGIOGRAPHY N/A 06/29/2017   Procedure: LEFT HEART CATH AND CORONARY ANGIOGRAPHY;  Surgeon: Laurier Nancy, MD;  Location: [facility];  Service: Cardiovascular;  Laterality: N/A;   LEFT HEART CATH AND CORONARY ANGIOGRAPHY Left 12/02/2018   Procedure: LEFT HEART CATH AND CORONARY ANGIOGRAPHY;  Surgeon: Laurier Nancy, MD;  Location: [facility];  Service: Cardiovascular;  Laterality: Left;   OPEN REDUCTION INTERNAL FIXATION (ORIF) DISTAL RADIAL FRACTURE Right 08/12/2018   Procedure: OPEN REDUCTION INTERNAL FIXATION (ORIF) DISTAL RADIAL FRACTURE;  Surgeon: Deeann Saint, MD;  Location: [facility];  Service: Orthopedics;  Laterality: Right;   POLYPECTOMY  11/20/2016   Procedure: POLYPECTOMY;  Surgeon: Midge Minium, MD;  Location: [facility];  Service: Endoscopy;;   TUBAL LIGATION     VAGINAL HYSTERECTOMY     VULVECTOMY Right 01/31/2016   Procedure: WIDE EXCISION VULVECTOMY-RIGHT LABIA;  Surgeon: Herold Harms, MD;  Location: [facility];  Service: Gynecology;  Laterality: Right;    Social History   Socioeconomic History   Marital status: Single    Spouse name: Not on file   Number of children: 5   Years of education: Not on file   Highest education level: Not on file  Occupational History  Occupation: part time job    Comment: [employer]scape [employer]  Tobacco Use   Smoking status: Every Day    Current packs/day: 1.00    Average packs/day: 1 pack/day for 35.0 years (35.0 ttl pk-yrs)    Types: Cigarettes   Smokeless tobacco: Current    Types: Snuff  Vaping Use   Vaping status: Never Used  Substance and Sexual Activity   Alcohol use: No    Alcohol/week: 0.0 standard drinks of alcohol   Drug use: No   Sexual activity: Not Currently    Partners: Male    Birth control/protection: Surgical  Other Topics Concern   Not on file  Social History Narrative   Not on file   Social Determinants of Health   Financial Resource  Strain: Low Risk  (12/02/2018)   Overall Financial Resource Strain (CARDIA)    Difficulty of Paying Living Expenses: Not very hard  Food Insecurity: No Food Insecurity (12/02/2018)   Hunger Vital Sign    Worried About Running Out of Food in the Last Year: Never true    Ran Out of Food in the Last Year: Never true  Transportation Needs: No Transportation Needs (12/02/2018)   PRAPARE - Administrator, Civil Service (Medical): No    Lack of Transportation (Non-Medical): No  Physical Activity: Inactive (12/18/2017)   Exercise Vital Sign    Days of Exercise per Week: 0 days    Minutes of Exercise per Session: 0 min  Stress: No Stress Concern Present (12/02/2018)   [employer] of Occupational Health - Occupational Stress Questionnaire    Feeling of Stress : Only a little  Social Connections: Unknown (12/02/2018)   Social Connection and Isolation Panel [NHANES]    Frequency of Communication with Friends and Family: More than three times a week    Frequency of Social Gatherings with Friends and Family: Not on file    Attends Religious Services: Not on file    Active Member of Clubs or Organizations: Not on file    Attends Banker Meetings: Not on file    Marital Status: Not on file  Intimate Partner Violence: Not At Risk (12/02/2018)   Humiliation, Afraid, Rape, and Kick questionnaire    Fear of Current or Ex-Partner: No    Emotionally Abused: No    Physically Abused: No    Sexually Abused: No    Family History  Problem Relation Age of Onset   Heart disease Father    Stroke Mother    Hypertension Mother    Lung cancer Brother    Heart attack Brother    Congestive Heart Failure Sister    Cervical cancer Sister    Cancer Neg Hx    Diabetes Neg Hx    Breast cancer Neg Hx     Allergies  Allergen Reactions   Requip [Ropinirole Hcl] Anaphylaxis    Throat closing   Celebrex [Celecoxib] Itching   Meloxicam Palpitations   Nitrofurantoin Palpitations     Review of Systems  Constitutional:  Positive for malaise/fatigue. Negative for chills, diaphoresis and fever.  HENT: Negative.    Eyes: Negative.   Respiratory: Negative.  Negative for cough and shortness of breath.   Cardiovascular: Negative.  Negative for chest pain, palpitations and leg swelling.  Gastrointestinal: Negative.  Negative for abdominal pain, constipation, diarrhea, heartburn, nausea and vomiting.  Genitourinary:  Positive for dysuria, frequency and urgency. Negative for flank pain.  Musculoskeletal: Negative.  Negative for joint pain and myalgias.  Skin:  Negative.   Neurological: Negative.  Negative for dizziness and headaches.  Endo/Heme/Allergies: Negative.   Psychiatric/Behavioral: Negative.  Negative for depression and suicidal ideas. The patient is not nervous/anxious.        Objective:   BP (!) 142/86   Pulse (!) 52   Ht 5\' 2"  (1.575 m)   Wt 146 lb 9.6 oz (66.5 kg)   SpO2 99%   BMI 26.81 kg/m   Vitals:   04/23/23 1326  BP: (!) 142/86  Pulse: (!) 52  Height: 5\' 2"  (1.575 m)  Weight: 146 lb 9.6 oz (66.5 kg)  SpO2: 99%  BMI (Calculated): 26.81    Physical Exam Vitals and nursing note reviewed.  Constitutional:      Appearance: Normal appearance.  HENT:     Head: Normocephalic and atraumatic.     Nose: Nose normal.     Mouth/Throat:     Mouth: Mucous membranes are moist.     Pharynx: Oropharynx is clear.  Eyes:     Conjunctiva/sclera: Conjunctivae normal.     Pupils: Pupils are equal, round, and reactive to light.  Cardiovascular:     Rate and Rhythm: Normal rate and regular rhythm.     Pulses: Normal pulses.     Heart sounds: Normal heart sounds. No murmur heard. Pulmonary:     Effort: Pulmonary effort is normal.     Breath sounds: Normal breath sounds. No wheezing.  Abdominal:     General: Bowel sounds are normal.     Palpations: Abdomen is soft.     Tenderness: There is abdominal tenderness in the suprapubic area. There is no  right CVA tenderness or left CVA tenderness.  Musculoskeletal:        General: Normal range of motion.     Cervical back: Normal range of motion.     Right lower leg: No edema.     Left lower leg: No edema.  Skin:    General: Skin is warm and dry.  Neurological:     General: No focal deficit present.     Mental Status: She is alert and oriented to person, place, and time.  Psychiatric:        Mood and Affect: Mood normal.        Behavior: Behavior normal.     Results for orders placed or performed in visit on 04/23/23  POCT Urinalysis Dipstick (81002)  Result Value Ref Range   Color, UA yellow    Clarity, UA cloudy    Glucose, UA Negative Negative   Bilirubin, UA neg    Ketones, UA neg    Spec Grav, UA 1.020 1.010 - 1.025   Blood, UA small    pH, UA 6.0 5.0 - 8.0   Protein, UA Positive (A) Negative   Urobilinogen, UA 0.2 0.2 or 1.0 E.U./dL   Nitrite, UA pos    Leukocytes, UA Large (3+) (A) Negative   Appearance cloudy    Odor yes     Recent Results (from the past 2160 hour(s))  Lactate dehydrogenase     Status: None   Collection Time: 01/29/23  1:18 PM  Result Value Ref Range   LDH 106 98 - 192 U/L    Comment: Performed at [facility], [address] [place], [place] [address]  CMP (Cancer Center only)     Status: Abnormal   Collection Time: 01/29/23  1:18 PM  Result Value Ref Range   Sodium 133 (L) 135 - 145 mmol/L   Potassium  4.0 3.5 - 5.1 mmol/L   Chloride 101 98 - 111 mmol/L   CO2 26 22 - 32 mmol/L   Glucose, Bld 100 (H) 70 - 99 mg/dL    Comment: Glucose reference range applies only to samples taken after fasting for at least 8 hours.   BUN 10 8 - 23 mg/dL   Creatinine 6.21 (H) 3.08 - 1.00 mg/dL   Calcium 9.5 8.9 - 65.7 mg/dL   Total Protein 6.5 6.5 - 8.1 g/dL   Albumin 4.0 3.5 - 5.0 g/dL   AST 12 (L) 15 - 41 U/L   ALT 10 0 - 44 U/L   Alkaline Phosphatase 64 38 - 126 U/L   Total Bilirubin 0.5 0.3 - 1.2 mg/dL   GFR, Estimated 45 (L) >60 mL/min     Comment: (NOTE) Calculated using the CKD-EPI Creatinine Equation (2021)    Anion gap 6 5 - 15    Comment: Performed at [facility], [address] [place], [place] [address]  CBC with Differential (Cancer Center Only)     Status: Abnormal   Collection Time: 01/29/23  1:18 PM  Result Value Ref Range   WBC Count 39.5 (H) 4.0 - 10.5 K/uL   RBC 3.68 (L) 3.87 - 5.11 MIL/uL   Hemoglobin 11.2 (L) 12.0 - 15.0 g/dL   HCT 29.5 (L) 28.4 - 13.2 %   MCV 95.4 80.0 - 100.0 fL   MCH 30.4 26.0 - 34.0 pg   MCHC 31.9 30.0 - 36.0 g/dL   RDW 44.0 10.2 - 72.5 %   Platelet Count 160 150 - 400 K/uL   nRBC 0.1 0.0 - 0.2 %   Neutrophils Relative % 11 %   Neutro Abs 4.5 1.7 - 7.7 K/uL   Lymphocytes Relative 86 %   Lymphs Abs 33.5 (H) 0.7 - 4.0 K/uL   Monocytes Relative 3 %   Monocytes Absolute 1.2 (H) 0.1 - 1.0 K/uL   Eosinophils Relative 0 %   Eosinophils Absolute 0.2 0.0 - 0.5 K/uL   Basophils Relative 0 %   Basophils Absolute 0.1 0.0 - 0.1 K/uL   WBC Morphology SMUDGE CELLS     Comment: DIFF. CONFIRMED BY SMEAR   RBC Morphology MORPHOLOGY UNREMARKABLE    Smear Review Normal platelet morphology     Comment: PLATELETS APPEAR ADEQUATE   Immature Granulocytes 0 %   Abs Immature Granulocytes 0.08 (H) 0.00 - 0.07 K/uL    Comment: Performed at [facility], [address] [place], [place] [address]  POCT Urinalysis Dipstick [phone])     Status: Abnormal   Collection Time: 04/23/23  1:31 PM  Result Value Ref Range   Color, UA yellow    Clarity, UA cloudy    Glucose, UA Negative Negative   Bilirubin, UA neg    Ketones, UA neg    Spec Grav, UA 1.020 1.010 - 1.025   Blood, UA small    pH, UA 6.0 5.0 - 8.0   Protein, UA Positive (A) Negative   Urobilinogen, UA 0.2 0.2 or 1.0 E.U./dL   Nitrite, UA pos    Leukocytes, UA Large (3+) (A) Negative   Appearance cloudy    Odor yes       Assessment & Plan:  Patient advised to increase her water intake.  Start p.o. Augmentin 1 tablet  twice a day.  Will send Specimen for culture sensitivity and change antibiotics if needed. Problem List Items Addressed This Visit     CLL (chronic  lymphocytic leukemia) (HCC) (Chronic)   Relevant Medications   amoxicillin-clavulanate (AUGMENTIN) 875-125 MG tablet   Essential hypertension, benign   Mixed hyperlipidemia   Other Visit Diagnoses     Acute cystitis with hematuria    -  Primary   Relevant Medications   amoxicillin-clavulanate (AUGMENTIN) 875-125 MG tablet   Other Relevant Orders   Urine Culture   Urinalysis   Dysuria       Relevant Orders   POCT Urinalysis Dipstick (09811) (Completed)       Return in about 2 weeks (around 05/07/2023).   Total time spent: 30 minutes  Margaretann Loveless, MD  04/23/2023   This document may have been prepared by [facility] Voice Recognition software and as such may include unintentional dictation errors.

## 2023-04-24 LAB — URINALYSIS
Bilirubin, UA: NEGATIVE
Glucose, UA: NEGATIVE
Ketones, UA: NEGATIVE
Nitrite, UA: NEGATIVE
Specific Gravity, UA: 1.016 (ref 1.005–1.030)
Urobilinogen, Ur: 0.2 mg/dL (ref 0.2–1.0)
pH, UA: 5.5 (ref 5.0–7.5)

## 2023-04-26 LAB — URINE CULTURE

## 2023-05-07 ENCOUNTER — Ambulatory Visit: Payer: 59 | Admitting: Internal Medicine

## 2023-05-10 ENCOUNTER — Ambulatory Visit: Payer: 59 | Admitting: Internal Medicine

## 2023-05-14 ENCOUNTER — Ambulatory Visit (INDEPENDENT_AMBULATORY_CARE_PROVIDER_SITE_OTHER): Payer: 59 | Admitting: Internal Medicine

## 2023-05-14 ENCOUNTER — Encounter: Payer: Self-pay | Admitting: Internal Medicine

## 2023-05-14 ENCOUNTER — Other Ambulatory Visit: Payer: Self-pay | Admitting: Internal Medicine

## 2023-05-14 VITALS — BP 115/70 | HR 49 | Ht 62.0 in | Wt 149.6 lb

## 2023-05-14 DIAGNOSIS — Z1231 Encounter for screening mammogram for malignant neoplasm of breast: Secondary | ICD-10-CM

## 2023-05-14 DIAGNOSIS — E782 Mixed hyperlipidemia: Secondary | ICD-10-CM | POA: Diagnosis not present

## 2023-05-14 DIAGNOSIS — I10 Essential (primary) hypertension: Secondary | ICD-10-CM | POA: Diagnosis not present

## 2023-05-14 DIAGNOSIS — Z0000 Encounter for general adult medical examination without abnormal findings: Secondary | ICD-10-CM | POA: Diagnosis not present

## 2023-05-14 DIAGNOSIS — F411 Generalized anxiety disorder: Secondary | ICD-10-CM | POA: Diagnosis not present

## 2023-05-14 DIAGNOSIS — C9110 Chronic lymphocytic leukemia of B-cell type not having achieved remission: Secondary | ICD-10-CM | POA: Diagnosis not present

## 2023-05-14 DIAGNOSIS — Z13820 Encounter for screening for osteoporosis: Secondary | ICD-10-CM

## 2023-05-14 DIAGNOSIS — F17220 Nicotine dependence, chewing tobacco, uncomplicated: Secondary | ICD-10-CM

## 2023-05-14 DIAGNOSIS — R7303 Prediabetes: Secondary | ICD-10-CM | POA: Diagnosis not present

## 2023-05-14 MED ORDER — DULOXETINE HCL 60 MG PO CPEP: 60.0000 mg | ORAL_CAPSULE | Freq: Every day | ORAL | 2 refills | Status: DC

## 2023-05-14 NOTE — Progress Notes (Signed)
Established Patient Office Visit  Subjective:  Patient ID: Karen [name], female    DOB: 08-Aug-1945  Age: 77 y.o. MRN: [id number]  Chief Complaint  Patient presents with   Medicare Wellness    AWV    Patient comes in for her annual wellness visit. She is grieving today as lost her son unexpectedly. She is currently on medications, Wellbutrin and trazodone Wellbutrin is not working anymore.. Will taper it off and start Cymbalta. Her PHQ-9-GAD score is 10/21.   CFS score is 4. She missed her mammogram and DEXA, will reschedule. Last colonoscopy was 2018.     No other concerns at this time.   Past Medical History:  Diagnosis Date   Anxiety    Artery occlusion    Asthma    Back pain    Bronchitis    Cancer (HCC)    Skin Cancer   CLL (chronic lymphocytic leukemia) (HCC)    Coronary artery disease    Depression    Dyspnea    GERD (gastroesophageal reflux disease)    Herpes    History of kidney stones    Hypercholesteremia    Hyperlipemia    Hypertension    Hypothyroidism    Myocardial infarction (HCC) 1999   Neuropathy    Restless leg syndrome    Sleep apnea    use C-PAP   Wears dentures    full upper, partial lower.  doesn't currently wear    Past Surgical History:  Procedure Laterality Date   COLONOSCOPY WITH PROPOFOL N/A 11/20/2016   Procedure: COLONOSCOPY WITH PROPOFOL;  Surgeon: Midge Minium, MD;  Location: [facility];  Service: Endoscopy;  Laterality: N/A;  sleep apnea   CORONARY ANGIOPLASTY WITH STENT PLACEMENT  2008   DILATION AND CURETTAGE OF UTERUS     ESOPHAGOGASTRODUODENOSCOPY (EGD) WITH PROPOFOL N/A 03/09/2022   Procedure: ESOPHAGOGASTRODUODENOSCOPY (EGD) WITH PROPOFOL;  Surgeon: Midge Minium, MD;  Location: [facility];  Service: Endoscopy;  Laterality: N/A;   EYE SURGERY Bilateral    Cataract Extraction with IOL   FRACTURE SURGERY     HAND SURGERY Left 12/2017   upper hand fracture repair   LEFT HEART CATH AND CORONARY  ANGIOGRAPHY N/A 06/29/2017   Procedure: LEFT HEART CATH AND CORONARY ANGIOGRAPHY;  Surgeon: Laurier Nancy, MD;  Location: [facility];  Service: Cardiovascular;  Laterality: N/A;   LEFT HEART CATH AND CORONARY ANGIOGRAPHY Left 12/02/2018   Procedure: LEFT HEART CATH AND CORONARY ANGIOGRAPHY;  Surgeon: Laurier Nancy, MD;  Location: [facility];  Service: Cardiovascular;  Laterality: Left;   OPEN REDUCTION INTERNAL FIXATION (ORIF) DISTAL RADIAL FRACTURE Right 08/12/2018   Procedure: OPEN REDUCTION INTERNAL FIXATION (ORIF) DISTAL RADIAL FRACTURE;  Surgeon: Deeann Saint, MD;  Location: [facility];  Service: Orthopedics;  Laterality: Right;   POLYPECTOMY  11/20/2016   Procedure: POLYPECTOMY;  Surgeon: Midge Minium, MD;  Location: [facility];  Service: Endoscopy;;   TUBAL LIGATION     VAGINAL HYSTERECTOMY     VULVECTOMY Right 01/31/2016   Procedure: WIDE EXCISION VULVECTOMY-RIGHT LABIA;  Surgeon: Herold Harms, MD;  Location: [facility];  Service: Gynecology;  Laterality: Right;    Social History   Socioeconomic History   Marital status: Single    Spouse name: Not on file   Number of children: 5   Years of education: Not on file   Highest education level: Not on file  Occupational History   Occupation: part time job    Comment: [employer]  House  Tobacco Use   Smoking status: Every Day    Current packs/day: 1.00    Average packs/day: 1 pack/day for 35.0 years (35.0 ttl pk-yrs)    Types: Cigarettes   Smokeless tobacco: Current    Types: Snuff  Vaping Use   Vaping status: Never Used  Substance and Sexual Activity   Alcohol use: No    Alcohol/week: 0.0 standard drinks of alcohol   Drug use: No   Sexual activity: Not Currently    Partners: Male    Birth control/protection: Surgical  Other Topics Concern   Not on file  Social History Narrative   Not on file   Social Determinants of Health   Financial Resource Strain: Low Risk  (12/02/2018)   Overall  Financial Resource Strain (CARDIA)    Difficulty of Paying Living Expenses: Not very hard  Food Insecurity: No Food Insecurity (05/14/2023)   Hunger Vital Sign    Worried About Running Out of Food in the Last Year: Never true    Ran Out of Food in the Last Year: Never true  Transportation Needs: No Transportation Needs (05/14/2023)   PRAPARE - Administrator, Civil Service (Medical): No    Lack of Transportation (Non-Medical): No  Physical Activity: Inactive (05/14/2023)   Exercise Vital Sign    Days of Exercise per Week: 0 days    Minutes of Exercise per Session: 0 min  Stress: No Stress Concern Present (05/14/2023)   [employer] of Occupational Health - Occupational Stress Questionnaire    Feeling of Stress : Only a little  Social Connections: Unknown (12/02/2018)   Social Connection and Isolation Panel [NHANES]    Frequency of Communication with Friends and Family: More than three times a week    Frequency of Social Gatherings with Friends and Family: Not on file    Attends Religious Services: Not on file    Active Member of Clubs or Organizations: Not on file    Attends Banker Meetings: Not on file    Marital Status: Not on file  Intimate Partner Violence: Not At Risk (05/14/2023)   Humiliation, Afraid, Rape, and Kick questionnaire    Fear of Current or Ex-Partner: No    Emotionally Abused: No    Physically Abused: No    Sexually Abused: No    Family History  Problem Relation Age of Onset   Heart disease Father    Stroke Mother    Hypertension Mother    Lung cancer Brother    Heart attack Brother    Congestive Heart Failure Sister    Cervical cancer Sister    Cancer Neg Hx    Diabetes Neg Hx    Breast cancer Neg Hx     Allergies  Allergen Reactions   Requip [Ropinirole Hcl] Anaphylaxis    Throat closing   Celebrex [Celecoxib] Itching   Meloxicam Palpitations   Nitrofurantoin Palpitations    Review of Systems  Constitutional:   Positive for malaise/fatigue. Negative for chills, diaphoresis, fever and weight loss.  HENT:  Positive for hearing loss. Negative for congestion, ear discharge, sinus pain and sore throat.   Eyes: Negative.   Respiratory: Negative.  Negative for cough and shortness of breath.   Cardiovascular: Negative.  Negative for chest pain, palpitations and leg swelling.  Gastrointestinal: Negative.  Negative for abdominal pain, diarrhea, heartburn, melena, nausea and vomiting.  Genitourinary: Negative.  Negative for dysuria and flank pain.  Musculoskeletal: Negative.  Negative for joint pain and  myalgias.  Skin: Negative.   Neurological: Negative.  Negative for dizziness, tingling and headaches.  Endo/Heme/Allergies: Negative.   Psychiatric/Behavioral:  Negative for depression and suicidal ideas. The patient is nervous/anxious.        Objective:   BP 115/70   Pulse (!) 49   Ht 5\' 2"  (1.575 m)   Wt 149 lb 9.6 oz (67.9 kg)   SpO2 91%   BMI 27.36 kg/m   Vitals:   05/14/23 1144  BP: 115/70  Pulse: (!) 49  Height: 5\' 2"  (1.575 m)  Weight: 149 lb 9.6 oz (67.9 kg)  SpO2: 91%  BMI (Calculated): 27.36    Physical Exam Vitals and nursing note reviewed.  Constitutional:      General: She is not in acute distress.    Appearance: Normal appearance.  HENT:     Head: Normocephalic and atraumatic.     Right Ear: Ear canal normal.     Left Ear: Ear canal normal.     Nose: Nose normal.     Mouth/Throat:     Mouth: Mucous membranes are moist.     Pharynx: Oropharynx is clear.  Eyes:     Conjunctiva/sclera: Conjunctivae normal.     Pupils: Pupils are equal, round, and reactive to light.  Cardiovascular:     Rate and Rhythm: Normal rate and regular rhythm.     Pulses: Normal pulses.     Heart sounds: Normal heart sounds. No murmur heard. Pulmonary:     Effort: Pulmonary effort is normal.     Breath sounds: Normal breath sounds. No wheezing.  Abdominal:     General: Bowel sounds are  normal.     Palpations: Abdomen is soft.     Tenderness: There is no abdominal tenderness. There is no right CVA tenderness or left CVA tenderness.  Musculoskeletal:        General: Normal range of motion.     Cervical back: Normal range of motion.     Right lower leg: No edema.     Left lower leg: No edema.  Skin:    General: Skin is warm and dry.  Neurological:     General: No focal deficit present.     Mental Status: She is alert and oriented to person, place, and time.     Gait: Gait normal.  Psychiatric:        Mood and Affect: Mood normal.        Behavior: Behavior normal.    No results found for any visits on 05/14/23.      Assessment & Plan:  Continue current medications. Check labs.  Schedule mammo and DEXA scan. Taper off Wellbutrin and start on Cymbalta. Problem List Items Addressed This Visit     CLL (chronic lymphocytic leukemia) (HCC) (Chronic)   Essential hypertension, benign   Relevant Orders   CMP14+EGFR   Mixed hyperlipidemia   Relevant Orders   Lipid Panel w/o Chol/HDL Ratio   GAD (generalized anxiety disorder)   Relevant Medications   DULoxetine (CYMBALTA) 60 MG capsule   Other Visit Diagnoses     Preventative health care    -  Primary   Breast cancer screening by mammogram       Relevant Orders   MM 3D SCREENING MAMMOGRAM BILATERAL BREAST   Screening for osteoporosis       Relevant Orders   DG Bone Density   Prediabetes       Relevant Orders   Hemoglobin A1c  Return in about 1 month (around 06/14/2023).   Total time spent: 40 minutes  Margaretann Loveless, MD  05/14/2023   This document may have been prepared by [facility] Voice Recognition software and as such may include unintentional dictation errors.

## 2023-05-15 LAB — CMP14+EGFR
ALT: 16 [IU]/L (ref 0–32)
AST: 14 [IU]/L (ref 0–40)
Albumin: 4.3 g/dL (ref 3.8–4.8)
Alkaline Phosphatase: 83 [IU]/L (ref 44–121)
BUN/Creatinine Ratio: 8 — ABNORMAL LOW (ref 12–28)
BUN: 11 mg/dL (ref 8–27)
Bilirubin Total: 0.4 mg/dL (ref 0.0–1.2)
CO2: 23 mmol/L (ref 20–29)
Calcium: 9.6 mg/dL (ref 8.7–10.3)
Chloride: 102 mmol/L (ref 96–106)
Creatinine, Ser: 1.32 mg/dL — ABNORMAL HIGH (ref 0.57–1.00)
Globulin, Total: 1.8 g/dL (ref 1.5–4.5)
Glucose: 90 mg/dL (ref 70–99)
Potassium: 4.3 mmol/L (ref 3.5–5.2)
Sodium: 138 mmol/L (ref 134–144)
Total Protein: 6.1 g/dL (ref 6.0–8.5)
eGFR: 41 mL/min/{1.73_m2} — ABNORMAL LOW (ref >59)

## 2023-05-15 LAB — LIPID PANEL W/O CHOL/HDL RATIO
Cholesterol, Total: 107 mg/dL (ref 100–199)
HDL: 37 mg/dL — ABNORMAL LOW (ref >39)
LDL Chol Calc (NIH): 51 mg/dL (ref 0–99)
Triglycerides: 101 mg/dL (ref 0–149)
VLDL Cholesterol Cal: 19 mg/dL (ref 5–40)

## 2023-05-15 LAB — HEMOGLOBIN A1C
Est. average glucose Bld gHb Est-mCnc: 120 mg/dL
Hgb A1c MFr Bld: 5.8 % — ABNORMAL HIGH (ref 4.8–5.6)

## 2023-05-21 ENCOUNTER — Encounter: Payer: Self-pay | Admitting: Cardiology

## 2023-05-21 ENCOUNTER — Ambulatory Visit (INDEPENDENT_AMBULATORY_CARE_PROVIDER_SITE_OTHER): Payer: 59 | Admitting: Cardiology

## 2023-05-21 VITALS — BP 135/75 | HR 54 | Ht 62.0 in | Wt 148.8 lb

## 2023-05-21 DIAGNOSIS — I2510 Atherosclerotic heart disease of native coronary artery without angina pectoris: Secondary | ICD-10-CM | POA: Diagnosis not present

## 2023-05-21 DIAGNOSIS — R079 Chest pain, unspecified: Secondary | ICD-10-CM | POA: Insufficient documentation

## 2023-05-21 NOTE — Assessment & Plan Note (Signed)
CCTA 10/2021 Quality of study: Good  1-Calcium score: 796.2  2-Right dominant system.  3-LCX has no significant disease. RCA mid has mild 40% disease. LAD  has 30% restenosis in stent in mid porton. 50% disease after the stent. Treat medically.  Reschedule stress test and echo.

## 2023-05-21 NOTE — Progress Notes (Signed)
Cardiology Office Note   Date:  05/21/2023   ID:  Bayly, Yantz 08/10/1945, MRN [id number]  PCP:  Karen Loveless, MD  Cardiologist:  Marisue Ivan, NP      History of Present Illness: Karen [name] is a 77 y.o. female who presents for  Chief Complaint  Patient presents with   Follow-up    Chest pain    Patient in office for an acute visit for chest pain. Patient states pain started last night under her left breast, radiated to above her right breast. EKG today showed no acute changes. Patient [name] maximum dose of isosorbide, Ranexa caused hives. Patient was ordered to have a stress test and echo in 09/2022, she did not have them done.   Chest Pain  This is a chronic problem. The current episode started yesterday. The onset quality is undetermined. The problem has been waxing and waning. Associated symptoms include irregular heartbeat. Risk factors include being elderly, lack of exercise, stress, sedentary lifestyle and post-menopausal.      Past Medical History:  Diagnosis Date   Anxiety    Artery occlusion    Asthma    Back pain    Bronchitis    Cancer (HCC)    Skin Cancer   CLL (chronic lymphocytic leukemia) (HCC)    Coronary artery disease    Depression    Dyspnea    GERD (gastroesophageal reflux disease)    Herpes    History of kidney stones    Hypercholesteremia    Hyperlipemia    Hypertension    Hypothyroidism    Myocardial infarction (HCC) 1999   Neuropathy    Restless leg syndrome    Sleep apnea    use C-PAP   Wears dentures    full upper, partial lower.  doesn't currently wear     Past Surgical History:  Procedure Laterality Date   COLONOSCOPY WITH PROPOFOL N/A 11/20/2016   Procedure: COLONOSCOPY WITH PROPOFOL;  Surgeon: Midge Minium, MD;  Location: [facility];  Service: Endoscopy;  Laterality: N/A;  sleep apnea   CORONARY ANGIOPLASTY WITH STENT PLACEMENT  2008   DILATION AND CURETTAGE OF UTERUS     ESOPHAGOGASTRODUODENOSCOPY  (EGD) WITH PROPOFOL N/A 03/09/2022   Procedure: ESOPHAGOGASTRODUODENOSCOPY (EGD) WITH PROPOFOL;  Surgeon: Midge Minium, MD;  Location: [facility];  Service: Endoscopy;  Laterality: N/A;   EYE SURGERY Bilateral    Cataract Extraction with IOL   FRACTURE SURGERY     HAND SURGERY Left 12/2017   upper hand fracture repair   LEFT HEART CATH AND CORONARY ANGIOGRAPHY N/A 06/29/2017   Procedure: LEFT HEART CATH AND CORONARY ANGIOGRAPHY;  Surgeon: Laurier Nancy, MD;  Location: [facility];  Service: Cardiovascular;  Laterality: N/A;   LEFT HEART CATH AND CORONARY ANGIOGRAPHY Left 12/02/2018   Procedure: LEFT HEART CATH AND CORONARY ANGIOGRAPHY;  Surgeon: Laurier Nancy, MD;  Location: [facility];  Service: Cardiovascular;  Laterality: Left;   OPEN REDUCTION INTERNAL FIXATION (ORIF) DISTAL RADIAL FRACTURE Right 08/12/2018   Procedure: OPEN REDUCTION INTERNAL FIXATION (ORIF) DISTAL RADIAL FRACTURE;  Surgeon: Deeann Saint, MD;  Location: [facility];  Service: Orthopedics;  Laterality: Right;   POLYPECTOMY  11/20/2016   Procedure: POLYPECTOMY;  Surgeon: Midge Minium, MD;  Location: [facility];  Service: Endoscopy;;   TUBAL LIGATION     VAGINAL HYSTERECTOMY     VULVECTOMY Right 01/31/2016   Procedure: WIDE EXCISION VULVECTOMY-RIGHT LABIA;  Surgeon: Herold Harms, MD;  Location:  [facility];  Service: Gynecology;  Laterality: Right;     Current Outpatient Medications  Medication Sig Dispense Refill   acetaminophen (TYLENOL) 500 MG tablet Take 500-1,000 mg by mouth every 6 (six) hours as needed for moderate pain or fever.     albuterol (VENTOLIN HFA) 108 (90 Base) MCG/ACT inhaler Inhale 1-2 puffs into the lungs every 6 (six) hours as needed for wheezing or shortness of breath.     amLODipine (NORVASC) 5 MG tablet TAKE 1 TABLET BY MOUTH EVERY DAY 90 tablet 1   aspirin EC 81 MG tablet Take 1 tablet (81 mg total) by mouth daily. 30 tablet    atorvastatin (LIPITOR) 40 MG  tablet Take 1 tablet (40 mg total) by mouth daily. 30 tablet 2   clopidogrel (PLAVIX) 75 MG tablet TAKE 1 TABLET BY MOUTH EVERY DAY 90 tablet 1   DULoxetine (CYMBALTA) 60 MG capsule Take 1 capsule (60 mg total) by mouth daily. 30 capsule 2   isosorbide mononitrate (IMDUR) 120 MG 24 hr tablet TAKE 1 TABLET BY MOUTH EVERY DAY 90 tablet 3   lisinopril (ZESTRIL) 20 MG tablet TAKE 1 TABLET BY MOUTH EVERY DAY 90 tablet 1   lisinopril-hydrochlorothiazide (ZESTORETIC) 20-25 MG tablet Take 1 tablet by mouth daily.     meclizine (ANTIVERT) 25 MG tablet Take 1 tablet (25 mg total) by mouth 3 (three) times daily as needed for dizziness. 30 tablet 2   metoprolol succinate (TOPROL-XL) 50 MG 24 hr tablet Take 100 mg by mouth daily.     nitroGLYCERIN (NITROSTAT) 0.4 MG SL tablet Place 1 tablet (0.4 mg total) under the tongue every 5 (five) minutes as needed for chest pain. (Patient not taking: Reported [name] 01/29/2023) 30 tablet 3   ondansetron (ZOFRAN) 4 MG tablet Take 1 tablet (4 mg total) by mouth every 8 (eight) hours as needed for nausea or vomiting. 60 tablet 1   oxybutynin (DITROPAN-XL) 10 MG 24 hr tablet TAKE 1 TABLET BY MOUTH EVERY DAY 90 tablet 3   pantoprazole (PROTONIX) 40 MG tablet TAKE 1 TABLET BY MOUTH TWICE A DAY 180 tablet 1   PREMARIN vaginal cream USE 1 GRAM INTRAVAGINALLY TWICE PER WEEK 30 g 3   traZODone (DESYREL) 50 MG tablet TAKE 2 TABLETS BY MOUTH AT BEDTIME. 180 tablet 1   zanubrutinib (BRUKINSA) 80 MG capsule Take 2 capsules (160 mg total) by mouth 2 (two) times daily. 120 capsule 1   No current facility-administered medications for this visit.    Allergies:   Requip [ropinirole hcl], Celebrex [celecoxib], Meloxicam, Nitrofurantoin, and Ranexa [ranolazine]    Social History:   reports that she has been smoking cigarettes. She has a 35 pack-year smoking history. Her smokeless tobacco use includes snuff. She reports that she does not drink alcohol and does not use drugs.   Family History:   family history includes Cervical cancer in her sister; Congestive Heart Failure in her sister; Heart attack in her brother; Heart disease in her father; Hypertension in her mother; Lung cancer in her brother; Stroke in her mother.    ROS:     Review of Systems  Constitutional: Negative.   HENT: Negative.    Eyes: Negative.   Respiratory: Negative.    Cardiovascular:  Positive for chest pain.  Gastrointestinal: Negative.   Genitourinary: Negative.   Musculoskeletal: Negative.   Skin: Negative.   Neurological: Negative.   Endo/Heme/Allergies: Negative.   Psychiatric/Behavioral: Negative.    All other systems reviewed and are negative.  All other systems are reviewed and negative.    PHYSICAL EXAM: VS:  BP 135/75   Pulse (!) 54   Ht 5\' 2"  (1.575 m)   Wt 148 lb 12.8 oz (67.5 kg)   SpO2 98%   BMI 27.22 kg/m  , BMI Body mass index is 27.22 kg/m. Last weight:  Wt Readings from Last 3 Encounters:  05/21/23 148 lb 12.8 oz (67.5 kg)  05/14/23 149 lb 9.6 oz (67.9 kg)  04/23/23 146 lb 9.6 oz (66.5 kg)     Physical Exam Vitals and nursing note reviewed.  Constitutional:      Appearance: Normal appearance. She is normal weight.  HENT:     Head: Normocephalic and atraumatic.     Nose: Nose normal.     Mouth/Throat:     Mouth: Mucous membranes are moist.     Pharynx: Oropharynx is clear.  Eyes:     Conjunctiva/sclera: Conjunctivae normal.     Pupils: Pupils are equal, round, and reactive to light.  Cardiovascular:     Rate and Rhythm: Normal rate and regular rhythm.     Pulses: Normal pulses.     Heart sounds: Normal heart sounds.  Pulmonary:     Effort: Pulmonary effort is normal.     Breath sounds: Normal breath sounds.  Abdominal:     General: Abdomen is flat. Bowel sounds are normal.     Palpations: Abdomen is soft.  Musculoskeletal:        General: Normal range of motion.     Cervical back: Normal range of motion.  Skin:    General: Skin is warm and dry.   Neurological:     General: No focal deficit present.     Mental Status: She is alert and oriented to person, place, and time. Mental status is at baseline.  Psychiatric:        Mood and Affect: Mood normal.        Behavior: Behavior normal.      EKG: sinus bradycardia, HR 52 bpm  Recent Labs: 01/29/2023: Hemoglobin 11.2; Platelet Count 160 05/14/2023: ALT 16; BUN 11; Creatinine, Ser 1.32; Potassium 4.3; Sodium 138    Lipid Panel    Component Value Date/Time   CHOL 107 05/14/2023 1305   TRIG 101 05/14/2023 1305   HDL 37 (L) 05/14/2023 1305   CHOLHDL 3.2 12/11/2022 1224   CHOLHDL 8.4 08/10/2020 0523   VLDL 17 08/10/2020 0523   LDLCALC 51 05/14/2023 1305     ASSESSMENT AND PLAN:    ICD-10-CM   1. Chest pain, unspecified type  R07.9 EKG 12-Lead    2. Coronary artery disease involving native coronary artery of native heart without angina pectoris  I25.10 EKG 12-Lead       Problem List Items Addressed This Visit       Cardiovascular and Mediastinum   Coronary artery disease    CCTA 10/2021 Quality of study: Good  1-Calcium score: 796.2  2-Right dominant system.  3-LCX has no significant disease. RCA mid has mild 40% disease. LAD  has 30% restenosis in stent in mid porton. 50% disease after the stent. Treat medically.  Reschedule stress test and echo.       Relevant Orders   EKG 12-Lead     Other   Chest pain - Primary    No acute changes [name] EKG today.       Relevant Orders   EKG 12-Lead       Disposition:   Return in  about 2 weeks (around 06/04/2023) for after stress test and echo.    Total time spent: 25 minutes  Signed,  Marisue Ivan, NP  05/21/2023 2:59 PM    [facility]

## 2023-05-21 NOTE — Assessment & Plan Note (Signed)
No acute changes on EKG today.

## 2023-05-29 ENCOUNTER — Other Ambulatory Visit: Payer: Self-pay | Admitting: Internal Medicine

## 2023-06-01 ENCOUNTER — Ambulatory Visit
Admission: RE | Admit: 2023-06-01 | Discharge: 2023-06-01 | Disposition: A | Discharge status: Home / Self Care | Payer: 59 | Source: Ambulatory Visit | Attending: Oncology | Admitting: Oncology

## 2023-06-01 DIAGNOSIS — C9110 Chronic lymphocytic leukemia of B-cell type not having achieved remission: Secondary | ICD-10-CM | POA: Diagnosis not present

## 2023-06-01 DIAGNOSIS — K5750 Diverticulosis of both small and large intestine without perforation or abscess without bleeding: Secondary | ICD-10-CM | POA: Diagnosis not present

## 2023-06-01 DIAGNOSIS — K8020 Calculus of gallbladder without cholecystitis without obstruction: Secondary | ICD-10-CM | POA: Diagnosis not present

## 2023-06-07 ENCOUNTER — Other Ambulatory Visit: Payer: Self-pay | Admitting: Internal Medicine

## 2023-06-07 DIAGNOSIS — F411 Generalized anxiety disorder: Secondary | ICD-10-CM

## 2023-06-08 ENCOUNTER — Ambulatory Visit (INDEPENDENT_AMBULATORY_CARE_PROVIDER_SITE_OTHER): Payer: 59

## 2023-06-08 DIAGNOSIS — I351 Nonrheumatic aortic (valve) insufficiency: Secondary | ICD-10-CM | POA: Diagnosis not present

## 2023-06-08 DIAGNOSIS — I10 Essential (primary) hypertension: Secondary | ICD-10-CM

## 2023-06-08 DIAGNOSIS — I361 Nonrheumatic tricuspid (valve) insufficiency: Secondary | ICD-10-CM

## 2023-06-08 DIAGNOSIS — I480 Paroxysmal atrial fibrillation: Secondary | ICD-10-CM

## 2023-06-08 DIAGNOSIS — I2510 Atherosclerotic heart disease of native coronary artery without angina pectoris: Secondary | ICD-10-CM

## 2023-06-08 DIAGNOSIS — R0789 Other chest pain: Secondary | ICD-10-CM

## 2023-06-11 ENCOUNTER — Telehealth: Payer: Self-pay

## 2023-06-11 ENCOUNTER — Inpatient Hospital Stay: Payer: 59 | Attending: Oncology

## 2023-06-11 ENCOUNTER — Encounter: Payer: Self-pay | Admitting: Oncology

## 2023-06-11 ENCOUNTER — Inpatient Hospital Stay (HOSPITAL_BASED_OUTPATIENT_CLINIC_OR_DEPARTMENT_OTHER): Payer: 59 | Admitting: Oncology

## 2023-06-11 VITALS — BP 175/75 | Temp 96.4°F | Resp 18 | Wt 148.8 lb

## 2023-06-11 DIAGNOSIS — R590 Localized enlarged lymph nodes: Secondary | ICD-10-CM | POA: Insufficient documentation

## 2023-06-11 DIAGNOSIS — C9110 Chronic lymphocytic leukemia of B-cell type not having achieved remission: Secondary | ICD-10-CM | POA: Diagnosis not present

## 2023-06-11 DIAGNOSIS — N1831 Chronic kidney disease, stage 3a: Secondary | ICD-10-CM

## 2023-06-11 DIAGNOSIS — R1900 Intra-abdominal and pelvic swelling, mass and lump, unspecified site: Secondary | ICD-10-CM | POA: Diagnosis not present

## 2023-06-11 DIAGNOSIS — D649 Anemia, unspecified: Secondary | ICD-10-CM | POA: Diagnosis not present

## 2023-06-11 LAB — CMP (CANCER CENTER ONLY)
ALT: 14 U/L (ref 0–44)
AST: 13 U/L — ABNORMAL LOW (ref 15–41)
Albumin: 3.9 g/dL (ref 3.5–5.0)
Alkaline Phosphatase: 83 U/L (ref 38–126)
Anion gap: 9 (ref 5–15)
BUN: 10 mg/dL (ref 8–23)
CO2: 28 mmol/L (ref 22–32)
Calcium: 9.4 mg/dL (ref 8.9–10.3)
Chloride: 100 mmol/L (ref 98–111)
Creatinine: 0.98 mg/dL (ref 0.44–1.00)
GFR, Estimated: 59 mL/min — ABNORMAL LOW (ref >60)
Glucose, Bld: 111 mg/dL — ABNORMAL HIGH (ref 70–99)
Potassium: 3.4 mmol/L — ABNORMAL LOW (ref 3.5–5.1)
Sodium: 137 mmol/L (ref 135–145)
Total Bilirubin: 0.3 mg/dL (ref <1.2)
Total Protein: 6.6 g/dL (ref 6.5–8.1)

## 2023-06-11 LAB — CBC WITH DIFFERENTIAL (CANCER CENTER ONLY)
Abs Immature Granulocytes: 0.15 10*3/uL — ABNORMAL HIGH (ref 0.00–0.07)
Basophils Absolute: 0.2 10*3/uL — ABNORMAL HIGH (ref 0.0–0.1)
Basophils Relative: 0 %
Eosinophils Absolute: 0.3 10*3/uL (ref 0.0–0.5)
Eosinophils Relative: 1 %
HCT: 34.4 % — ABNORMAL LOW (ref 36.0–46.0)
Hemoglobin: 10.9 g/dL — ABNORMAL LOW (ref 12.0–15.0)
Immature Granulocytes: 0 %
Lymphocytes Relative: 85 %
Lymphs Abs: 47.3 10*3/uL — ABNORMAL HIGH (ref 0.7–4.0)
MCH: 30.5 pg (ref 26.0–34.0)
MCHC: 31.7 g/dL (ref 30.0–36.0)
MCV: 96.4 fL (ref 80.0–100.0)
Monocytes Absolute: 3.2 10*3/uL — ABNORMAL HIGH (ref 0.1–1.0)
Monocytes Relative: 6 %
Neutro Abs: 4.3 10*3/uL (ref 1.7–7.7)
Neutrophils Relative %: 8 %
Platelet Count: 214 10*3/uL (ref 150–400)
RBC: 3.57 MIL/uL — ABNORMAL LOW (ref 3.87–5.11)
RDW: 13 % (ref 11.5–15.5)
Smear Review: NORMAL
WBC Count: 55.4 10*3/uL (ref 4.0–10.5)
nRBC: 0 % (ref 0.0–0.2)

## 2023-06-11 LAB — LACTATE DEHYDROGENASE: LDH: 130 U/L (ref 98–192)

## 2023-06-11 NOTE — Assessment & Plan Note (Signed)
Recent images indicate ovarian origin. Normal CA 125 and elevated HE4.  Patient was seen by gynecology oncology Dr. Johnnette Litter.  Given that the mass has been stable for more than 6 years, Dr. Johnnette Litter recommends observation. CT showed stable pelvic mass size

## 2023-06-11 NOTE — Assessment & Plan Note (Signed)
CLL  IgVH Somatic Hypermutation was not detected, 13Q del,  Progressive intra-abdominal lymphadenopathy, She is not complaint with her medication.  Repeat PET scan findings were reviewed with patient.  Slightly increase of abdominal lymphadenopathy.  Previously I have advised patient to resume Zanubrutinib  and she was not compliant. CT showed some progression of her abdominopelvic adenopathy-overall stable during the past 4 months interval. We discussed about the option of resuming treatment vs continue observation. Leukocytosis is progressively getting worse, anemia is getting worse.  She has no significant constitutional symptoms. Patient elects to resume on Zanubrutinib 160 mg twice daily.  She states that she still has previous supplies and will start. She will follow-up in 1 month for evaluation of tolerability.

## 2023-06-11 NOTE — Telephone Encounter (Signed)
Received critical lab Value from [facility] in [place] lab.   WBC 55.4  MD notified.

## 2023-06-11 NOTE — Progress Notes (Signed)
Hematology/Oncology Progress note Telephone:([phone]) [phone] Fax:(336) [phone]        REASON FOR VISIT Follow up for treatment of CLL  ASSESSMENT & PLAN:   Cancer Staging  CLL (chronic lymphocytic leukemia) (HCC) Staging form: Chronic Lymphocytic Leukemia / Small Lymphocytic Lymphoma, AJCC 8th Edition - Clinical: Modified Rai Stage I (Modified Rai risk: Intermediate, Lymphocytosis: Present, Adenopathy: Present, Organomegaly: Absent, Anemia: Absent, Thrombocytopenia: Absent) - Signed by Karen Patience, MD on 12/06/2021   CLL (chronic lymphocytic leukemia) (HCC) CLL  IgVH Somatic Hypermutation was not detected, 13Q del,  Progressive intra-abdominal lymphadenopathy, She is not complaint with her medication.  Repeat PET scan findings were reviewed with patient.  Slightly increase of abdominal lymphadenopathy.  Previously I have advised patient to resume Zanubrutinib  and she was not compliant. CT showed some progression of her abdominopelvic adenopathy-overall stable during the past 4 months interval. We discussed about the option of resuming treatment vs continue observation. Leukocytosis is progressively getting worse, anemia is getting worse.  She has no significant constitutional symptoms. Patient elects to resume on Zanubrutinib 160 mg twice daily.  She states that she still has previous supplies and will start. She will follow-up in 1 month for evaluation of tolerability.   Pelvic mass Recent images indicate ovarian origin. Normal CA 125 and elevated HE4.  Patient was seen by gynecology oncology Dr. Johnnette [name].  Given that the mass has been stable for more than 6 years, Dr. Johnnette [name] recommends observation. CT showed stable pelvic mass size  CKD (chronic kidney disease) stage 3, GFR 30-59 ml/min (HCC) Encourage oral hydration.  Avoid nephrotoxins.  Orders Placed This Encounter  Procedures   CBC with Differential (Cancer Center Only)    Standing Status:   Future    Standing  Expiration Date:   06/10/2024   CMP (Cancer Center only)    Standing Status:   Future    Standing Expiration Date:   06/10/2024    Follow up in 1 month  Karen Patience, MD, PhD [facility] Hematology Oncology 06/11/2023       HISTORY OF PRESENTING ILLNESS:  Karen [name] is a  77 y.o.  female with PMH listed below who presents for follow-up of CLL treatments. Oncology history summary listed as below. Oncology History  CLL (chronic lymphocytic leukemia) (HCC)  09/24/2015 Imaging   10/14/2015 chest without contrast Images were independently reviewed by me Small pulmonary nodules are stable over the past year.  Considered benign.  Several lymph nodes are borderline enlarged, including the lower right paratracheal lymph node and a portacaval lymph node. 08/27/2017 US abdomen complete, no hepato-splenomegaly.   12/18/2017 Initial Diagnosis   CLL (chronic lymphocytic leukemia)   07/30/2017, peripheral blood flow cytometry showed CD5+, CD23+, CD38 negative monoclonal B-cell population consistent with a diagnosis of CLL   06/27/2019 Cancer Staging   Staging form: Chronic Lymphocytic Leukemia / Small Lymphocytic Lymphoma, AJCC 8th Edition - Clinical: Modified Rai Stage I (Modified Rai risk: Intermediate, Lymphocytosis: Present, Adenopathy: Present, Organomegaly: Absent, Anemia: Absent, Thrombocytopenia: Absent) - Signed by Karen Patience, MD on 12/06/2021   11/27/2021 Imaging   Outside facility CT Aorta and run off w wo  showed moderate atherosclerotic aortic disease.  Diffuse lightest of moderate disease of the bilateral Udenyca vasculature.Diffuse mild stenotic disease of the bilateral SFAs. In addition, patient has soft tissue mass/lymph node along the left pelvic sidewall measures 4.9 x 4.7 cm, numerous peritoneum subcu tissue nodule, a deep right pelvic nodule and numerous enlarged mesenteric and retroperitoneal lymph nodes are identified.  For example a right mesenteric lymph node measures 2.4 x 2.2 cm.   The left periaortic lymph node measures 4.4 x 2.0 cm.  A pericardial lymph node measures 2.6 x 1.3 cm.   01/03/2022 Pathology Results   Right abdomen lymph node biopsy showed chronic lymphocytic leukemia/small lymphocytic lymphoma    01/09/2022 - 02/06/2022 Chemotherapy   Acalabrutinib 100 mg twice daily  Stopped due to sensation of throat closing.   03/06/2022 -  Chemotherapy   Zanubrutinib, not able to swallow due to esophagitis   03/09/2022 - 03/12/2022 Hospital Admission   Patient reports that she took 1 day of zanubrutinib around 03/06/2022.  She has had difficulty swallowing the pills. She went to emergency room on 03/09/2022 for evaluation of dysphagia symptoms.  Symptoms occurs with both liquids and solids.  Also midsternal chest pain.  She was found to have hypertension emergency with systolic blood pressure greater than 200.  Patient states that she is being compliant with her blood pressure pills. Patient was treated with hydralazine, Cardizem drip.  Blood pressure improves. During the admission, she has had a EGD done by Dr. Servando Snare which showed esophagitis.  No biopsy was taken due to patient being on anticoagulation (aspirin, Plavix, low-dose Xarelto 2.5 mg twice daily] plan is for patient to follow-up with Dr. Norma Fredrickson outpatient for repeat EGD while off anticoagulation.  Her PPI dose was increased. She was discharged on metoprolol, lisinopril, amlodipine and nitrates   03/09/2022 Imaging   CT angio chest/abdomen/pelvis for dissection 1. No evidence for thoracic or abdominal aortic aneurysm or dissection. 2. Lymphadenopathy is identified in the chest, abdomen, and pelvis in this patient with reported history of CLL. Mediastinal lymphadenopathy is similar to prior chest CT of 2017. A hepatoduodenal ligament lymph node included on that prior chest CT has increased in size in the interval. Additional abdominopelvic lymphadenopathy was not included on available prior imaging. Close follow-up  recommended and comparison to old studies suggested to assess for progression. 3. Left pelvic soft tissue mass measures 5.5 x 4.4 cm. Left gonadal veins appear to arise directly from this lesion suggesting ovarian etiology. Pelvic ultrasound may prove helpful to further evaluate. 4. Bilateral renal lesions with attenuation too high to be simple cysts. These may be cysts complicated by proteinaceous debris or hemorrhage, but MRI abdomen with and without contrast recommended to confirm. 5. Diffuse colonic diverticulosis without diverticulitis.6. Stable appearance of small right-sided pulmonary nodules,consistent with benign etiology.7. Aortic Atherosclerosis (ICD10-I70.0).   03/09/2022 Imaging   Pelvic ultrasound showed 1. 5.1 cm solid soft tissue mass in the left adnexa likely representing left ovarian origin. Given that this patient has a reported history of CLL, there is likely prior imaging available outside are system. Consider comparison to previous outside studies to assess chronicity of this finding.2. No evidence for ovarian torsion.3. Status post hysterectomy.    03/09/22 EGD showed Non-severe non-erosive esophagitis with no bleeding INTERVAL HISTORY Karen [name] is a 77 y.o. female who has above history reviewed by me today presents for follow up visit for CLL She is accompanied by her friend Mardelle Matte She take PPI. She does not take her BP medication routinely.  Currently off Zanubrutinib.  Denies weight loss, fever, chills, fatigue, night sweats.  Chronic back and hip pain.     Review of Systems  Constitutional:  Positive for fatigue. Negative for appetite change, chills, fever and unexpected weight change.  HENT:   Negative for hearing loss and voice change.   Eyes:  Negative for eye  problems.  Respiratory:  Negative for chest tightness and cough.   Cardiovascular:  Negative for chest pain.  Gastrointestinal:  Negative for abdominal distention, abdominal pain, blood in stool and  nausea.  Endocrine: Negative for hot flashes.  Genitourinary:  Negative for difficulty urinating and frequency.   Musculoskeletal:  Positive for back pain. Negative for arthralgias.  Skin:  Negative for itching and rash.  Neurological:  Negative for extremity weakness and headaches.  Hematological:  Negative for adenopathy.  Psychiatric/Behavioral:  Negative for confusion.      MEDICAL HISTORY:  Past Medical History:  Diagnosis Date   Anxiety    Artery occlusion    Asthma    Back pain    Bronchitis    Cancer (HCC)    Skin Cancer   CLL (chronic lymphocytic leukemia) (HCC)    Coronary artery disease    Depression    Dyspnea    GERD (gastroesophageal reflux disease)    Herpes    History of kidney stones    Hypercholesteremia    Hyperlipemia    Hypertension    Hypothyroidism    Myocardial infarction (HCC) 1999   Neuropathy    Restless leg syndrome    Sleep apnea    use C-PAP   Wears dentures    full upper, partial lower.  doesn't currently wear    SURGICAL HISTORY: Past Surgical History:  Procedure Laterality Date   COLONOSCOPY WITH PROPOFOL N/A 11/20/2016   Procedure: COLONOSCOPY WITH PROPOFOL;  Surgeon: Midge Minium, MD;  Location: [facility];  Service: Endoscopy;  Laterality: N/A;  sleep apnea   CORONARY ANGIOPLASTY WITH STENT PLACEMENT  2008   DILATION AND CURETTAGE OF UTERUS     ESOPHAGOGASTRODUODENOSCOPY (EGD) WITH PROPOFOL N/A 03/09/2022   Procedure: ESOPHAGOGASTRODUODENOSCOPY (EGD) WITH PROPOFOL;  Surgeon: Midge Minium, MD;  Location: [facility];  Service: Endoscopy;  Laterality: N/A;   EYE SURGERY Bilateral    Cataract Extraction with IOL   FRACTURE SURGERY     HAND SURGERY Left 12/2017   upper hand fracture repair   LEFT HEART CATH AND CORONARY ANGIOGRAPHY N/A 06/29/2017   Procedure: LEFT HEART CATH AND CORONARY ANGIOGRAPHY;  Surgeon: Laurier Nancy, MD;  Location: [facility];  Service: Cardiovascular;  Laterality: N/A;   LEFT  HEART CATH AND CORONARY ANGIOGRAPHY Left 12/02/2018   Procedure: LEFT HEART CATH AND CORONARY ANGIOGRAPHY;  Surgeon: Laurier Nancy, MD;  Location: [facility];  Service: Cardiovascular;  Laterality: Left;   OPEN REDUCTION INTERNAL FIXATION (ORIF) DISTAL RADIAL FRACTURE Right 08/12/2018   Procedure: OPEN REDUCTION INTERNAL FIXATION (ORIF) DISTAL RADIAL FRACTURE;  Surgeon: Deeann Saint, MD;  Location: [facility];  Service: Orthopedics;  Laterality: Right;   POLYPECTOMY  11/20/2016   Procedure: POLYPECTOMY;  Surgeon: Midge Minium, MD;  Location: [facility];  Service: Endoscopy;;   TUBAL LIGATION     VAGINAL HYSTERECTOMY     VULVECTOMY Right 01/31/2016   Procedure: WIDE EXCISION VULVECTOMY-RIGHT LABIA;  Surgeon: Herold Harms, MD;  Location: [facility];  Service: Gynecology;  Laterality: Right;    SOCIAL HISTORY: Social History   Socioeconomic History   Marital status: Single    Spouse name: Not on file   Number of children: 5   Years of education: Not on file   Highest education level: Not on file  Occupational History   Occupation: part time job    Comment: [employer]  Tobacco Use   Smoking status: Every Day    Current  packs/day: 1.00    Average packs/day: 1 pack/day for 35.0 years (35.0 ttl pk-yrs)    Types: Cigarettes   Smokeless tobacco: Current    Types: Snuff  Vaping Use   Vaping status: Never Used  Substance and Sexual Activity   Alcohol use: No    Alcohol/week: 0.0 standard drinks of alcohol   Drug use: No   Sexual activity: Not Currently    Partners: Male    Birth control/protection: Surgical  Other Topics Concern   Not on file  Social History Narrative   Not on file   Social Determinants of Health   Financial Resource Strain: Low Risk  (12/02/2018)   Overall Financial Resource Strain (CARDIA)    Difficulty of Paying Living Expenses: Not very hard  Food Insecurity: No Food Insecurity (05/14/2023)   Hunger Vital Sign    Worried About  Running Out of Food in the Last Year: Never true    Ran Out of Food in the Last Year: Never true  Transportation Needs: No Transportation Needs (05/14/2023)   PRAPARE - Administrator, Civil Service (Medical): No    Lack of Transportation (Non-Medical): No  Physical Activity: Inactive (05/14/2023)   Exercise Vital Sign    Days of Exercise per Week: 0 days    Minutes of Exercise per Session: 0 min  Stress: No Stress Concern Present (05/14/2023)   [employer] of Occupational Health - Occupational Stress Questionnaire    Feeling of Stress : Only a little  Social Connections: Unknown (12/02/2018)   Social Connection and Isolation Panel [NHANES]    Frequency of Communication with Friends and Family: More than three times a week    Frequency of Social Gatherings with Friends and Family: Not on file    Attends Religious Services: Not on file    Active Member of Clubs or Organizations: Not on file    Attends Banker Meetings: Not on file    Marital Status: Not on file  Intimate Partner Violence: Not At Risk (05/14/2023)   Humiliation, Afraid, Rape, and Kick questionnaire    Fear of Current or Ex-Partner: No    Emotionally Abused: No    Physically Abused: No    Sexually Abused: No    FAMILY HISTORY: Family History  Problem Relation Age of Onset   Heart disease Father    Stroke Mother    Hypertension Mother    Lung cancer Brother    Heart attack Brother    Congestive Heart Failure Sister    Cervical cancer Sister    Cancer Neg Hx    Diabetes Neg Hx    Breast cancer Neg Hx     ALLERGIES:  is allergic to requip [ropinirole hcl], celebrex [celecoxib], meloxicam, nitrofurantoin, and ranexa [ranolazine].  MEDICATIONS:  Current Outpatient Medications  Medication Sig Dispense Refill   acetaminophen (TYLENOL) 500 MG tablet Take 500-1,000 mg by mouth every 6 (six) hours as needed for moderate pain or fever.     albuterol (VENTOLIN HFA) 108 (90 Base) MCG/ACT  inhaler INHALE 2 PUFFS BY MOUTH 4 TIMES A DAY AS NEEDED 8.5 each 3   amLODipine (NORVASC) 5 MG tablet TAKE 1 TABLET BY MOUTH EVERY DAY 90 tablet 1   aspirin EC 81 MG tablet Take 1 tablet (81 mg total) by mouth daily. 30 tablet    atorvastatin (LIPITOR) 40 MG tablet Take 1 tablet (40 mg total) by mouth daily. 30 tablet 2   clopidogrel (PLAVIX) 75 MG tablet TAKE 1  TABLET BY MOUTH EVERY DAY 90 tablet 1   DULoxetine (CYMBALTA) 60 MG capsule TAKE 1 CAPSULE BY MOUTH EVERY DAY 90 capsule 1   isosorbide mononitrate (IMDUR) 120 MG 24 hr tablet TAKE 1 TABLET BY MOUTH EVERY DAY 90 tablet 3   lisinopril (ZESTRIL) 20 MG tablet TAKE 1 TABLET BY MOUTH EVERY DAY 90 tablet 1   meclizine (ANTIVERT) 25 MG tablet Take 1 tablet (25 mg total) by mouth 3 (three) times daily as needed for dizziness. 30 tablet 2   metoprolol succinate (TOPROL-XL) 50 MG 24 hr tablet Take 100 mg by mouth daily.     ondansetron (ZOFRAN) 4 MG tablet Take 1 tablet (4 mg total) by mouth every 8 (eight) hours as needed for nausea or vomiting. 60 tablet 1   oxybutynin (DITROPAN-XL) 10 MG 24 hr tablet TAKE 1 TABLET BY MOUTH EVERY DAY 90 tablet 3   pantoprazole (PROTONIX) 40 MG tablet TAKE 1 TABLET BY MOUTH TWICE A DAY 180 tablet 1   PREMARIN vaginal cream USE 1 GRAM INTRAVAGINALLY TWICE PER WEEK 30 g 3   traZODone (DESYREL) 50 MG tablet TAKE 2 TABLETS BY MOUTH AT BEDTIME. 180 tablet 1   zanubrutinib (BRUKINSA) 80 MG capsule Take 2 capsules (160 mg total) by mouth 2 (two) times daily. 120 capsule 1   lisinopril-hydrochlorothiazide (ZESTORETIC) 20-25 MG tablet Take 1 tablet by mouth daily. (Patient not taking: Reported on 06/11/2023)     nitroGLYCERIN (NITROSTAT) 0.4 MG SL tablet Place 1 tablet (0.4 mg total) under the tongue every 5 (five) minutes as needed for chest pain. (Patient not taking: Reported on 01/29/2023) 30 tablet 3   No current facility-administered medications for this visit.     PHYSICAL EXAMINATION: ECOG PERFORMANCE STATUS: 1 -  Symptomatic but completely ambulatory Vitals:   06/11/23 1322  BP: (!) 175/75  Resp: 18  Temp: (!) 96.4 F (35.8 C)  SpO2: 99%   Filed Weights   06/11/23 1322  Weight: 148 lb 12.8 oz (67.5 kg)    Physical Exam Constitutional:      General: She is not in acute distress.    Appearance: She is not diaphoretic.     Comments: Patient walks with a cane.  HENT:     Head: Normocephalic.  Eyes:     General: No scleral icterus.       Left eye: No discharge.  Neck:     Vascular: No JVD.  Cardiovascular:     Rate and Rhythm: Normal rate.  Pulmonary:     Effort: Pulmonary effort is normal. No respiratory distress.  Abdominal:     General: Bowel sounds are normal. There is no distension.     Palpations: Abdomen is soft.  Musculoskeletal:        General: Normal range of motion.     Cervical back: Normal range of motion.  Skin:    General: Skin is warm and dry.  Neurological:     Mental Status: She is alert and oriented to person, place, and time. Mental status is at baseline.     Motor: No abnormal muscle tone.  Psychiatric:        Mood and Affect: Mood and affect normal.      LABORATORY DATA:  I have reviewed the data as listed    Latest Ref Rng & Units 06/11/2023    1:03 PM 01/29/2023    1:18 PM 12/11/2022   12:24 PM  CBC  WBC 4.0 - 10.5 K/uL 55.4  39.5  47.4   Hemoglobin 12.0 - 15.0 g/dL 16.1  09.6  04.5   Hematocrit 36.0 - 46.0 % 34.4  35.1  37.3   Platelets 150 - 400 K/uL 214  160        Latest Ref Rng & Units 06/11/2023    1:04 PM 05/14/2023    1:05 PM 01/29/2023    1:18 PM  CMP  Glucose 70 - 99 mg/dL 409  90  811   BUN 8 - 23 mg/dL 10  11  10    Creatinine 0.44 - 1.00 mg/dL 9.14  7.82  9.56   Sodium 135 - 145 mmol/L 137  138  133   Potassium 3.5 - 5.1 mmol/L 3.4  4.3  4.0   Chloride 98 - 111 mmol/L 100  102  101   CO2 22 - 32 mmol/L 28  23  26    Calcium 8.9 - 10.3 mg/dL 9.4  9.6  9.5   Total Protein 6.5 - 8.1 g/dL 6.6  6.1  6.5   Total Bilirubin <1.2 mg/dL  0.3  0.4  0.5   Alkaline Phos 38 - 126 U/L 83  83  64   AST 15 - 41 U/L 13  14  12    ALT 0 - 44 U/L 14  16  10

## 2023-06-11 NOTE — Assessment & Plan Note (Signed)
Encourage oral hydration. Avoid nephrotoxins. 

## 2023-06-11 NOTE — Progress Notes (Signed)
Pt here for CT results.

## 2023-06-12 ENCOUNTER — Ambulatory Visit (INDEPENDENT_AMBULATORY_CARE_PROVIDER_SITE_OTHER): Payer: 59

## 2023-06-12 DIAGNOSIS — I10 Essential (primary) hypertension: Secondary | ICD-10-CM

## 2023-06-12 DIAGNOSIS — R0789 Other chest pain: Secondary | ICD-10-CM

## 2023-06-12 DIAGNOSIS — I480 Paroxysmal atrial fibrillation: Secondary | ICD-10-CM

## 2023-06-12 DIAGNOSIS — I2510 Atherosclerotic heart disease of native coronary artery without angina pectoris: Secondary | ICD-10-CM

## 2023-06-12 MED ORDER — TECHNETIUM TC 99M SESTAMIBI GENERIC - CARDIOLITE
32.1000 | Freq: Once | INTRAVENOUS | Status: AC | PRN
  Administered 2023-06-12: 32.1 via INTRAVENOUS

## 2023-06-12 MED ORDER — TECHNETIUM TC 99M SESTAMIBI GENERIC - CARDIOLITE
9.3000 | Freq: Once | INTRAVENOUS | Status: AC | PRN
  Administered 2023-06-12: 9.3 via INTRAVENOUS

## 2023-06-13 ENCOUNTER — Other Ambulatory Visit: Payer: Self-pay | Admitting: Cardiovascular Disease

## 2023-06-14 ENCOUNTER — Ambulatory Visit: Payer: 59 | Admitting: Cardiovascular Disease

## 2023-06-15 ENCOUNTER — Ambulatory Visit (INDEPENDENT_AMBULATORY_CARE_PROVIDER_SITE_OTHER): Payer: 59 | Admitting: Cardiovascular Disease

## 2023-06-15 ENCOUNTER — Encounter: Payer: Self-pay | Admitting: Cardiovascular Disease

## 2023-06-15 VITALS — BP 122/81 | HR 59 | Ht 63.0 in | Wt 151.6 lb

## 2023-06-15 DIAGNOSIS — E782 Mixed hyperlipidemia: Secondary | ICD-10-CM | POA: Diagnosis not present

## 2023-06-15 DIAGNOSIS — I2510 Atherosclerotic heart disease of native coronary artery without angina pectoris: Secondary | ICD-10-CM | POA: Diagnosis not present

## 2023-06-15 DIAGNOSIS — R0789 Other chest pain: Secondary | ICD-10-CM

## 2023-06-15 DIAGNOSIS — I10 Essential (primary) hypertension: Secondary | ICD-10-CM | POA: Diagnosis not present

## 2023-06-15 DIAGNOSIS — I340 Nonrheumatic mitral (valve) insufficiency: Secondary | ICD-10-CM | POA: Diagnosis not present

## 2023-06-15 DIAGNOSIS — I480 Paroxysmal atrial fibrillation: Secondary | ICD-10-CM

## 2023-06-15 NOTE — Progress Notes (Signed)
Cardiology Office Note   Date:  06/15/2023   ID:  Karen [name], Karen [name] Dec 16, 1945, MRN [id number]  PCP:  Karen Loveless, MD  Cardiologist:  Karen Blackwater, MD      History of Present Illness: Karen [name] is a 77 y.o. female who presents for  Chief Complaint  Patient presents with   Follow-up    Occasionally gets left sided chest pains  Chest Pain  This is a recurrent problem. The current episode started more than 1 month ago. The problem has been waxing and waning.      Past Medical History:  Diagnosis Date   Anxiety    Artery occlusion    Asthma    Back pain    Bronchitis    Cancer (HCC)    Skin Cancer   CLL (chronic lymphocytic leukemia) (HCC)    Coronary artery disease    Depression    Dyspnea    GERD (gastroesophageal reflux disease)    Herpes    History of kidney stones    Hypercholesteremia    Hyperlipemia    Hypertension    Hypothyroidism    Myocardial infarction (HCC) 1999   Neuropathy    Restless leg syndrome    Sleep apnea    use C-PAP   Wears dentures    full upper, partial lower.  doesn't currently wear     Past Surgical History:  Procedure Laterality Date   COLONOSCOPY WITH PROPOFOL N/A 11/20/2016   Procedure: COLONOSCOPY WITH PROPOFOL;  Surgeon: Midge Minium, MD;  Location: [facility];  Service: Endoscopy;  Laterality: N/A;  sleep apnea   CORONARY ANGIOPLASTY WITH STENT PLACEMENT  2008   DILATION AND CURETTAGE OF UTERUS     ESOPHAGOGASTRODUODENOSCOPY (EGD) WITH PROPOFOL N/A 03/09/2022   Procedure: ESOPHAGOGASTRODUODENOSCOPY (EGD) WITH PROPOFOL;  Surgeon: Midge Minium, MD;  Location: [facility];  Service: Endoscopy;  Laterality: N/A;   EYE SURGERY Bilateral    Cataract Extraction with IOL   FRACTURE SURGERY     HAND SURGERY Left 12/2017   upper hand fracture repair   LEFT HEART CATH AND CORONARY ANGIOGRAPHY N/A 06/29/2017   Procedure: LEFT HEART CATH AND CORONARY ANGIOGRAPHY;  Surgeon: Karen Nancy, MD;  Location:  [facility];  Service: Cardiovascular;  Laterality: N/A;   LEFT HEART CATH AND CORONARY ANGIOGRAPHY Left 12/02/2018   Procedure: LEFT HEART CATH AND CORONARY ANGIOGRAPHY;  Surgeon: Karen Nancy, MD;  Location: [facility];  Service: Cardiovascular;  Laterality: Left;   OPEN REDUCTION INTERNAL FIXATION (ORIF) DISTAL RADIAL FRACTURE Right 08/12/2018   Procedure: OPEN REDUCTION INTERNAL FIXATION (ORIF) DISTAL RADIAL FRACTURE;  Surgeon: Karen Saint, MD;  Location: [facility];  Service: Orthopedics;  Laterality: Right;   POLYPECTOMY  11/20/2016   Procedure: POLYPECTOMY;  Surgeon: Midge Minium, MD;  Location: [facility];  Service: Endoscopy;;   TUBAL LIGATION     VAGINAL HYSTERECTOMY     VULVECTOMY Right 01/31/2016   Procedure: WIDE EXCISION VULVECTOMY-RIGHT LABIA;  Surgeon: Karen Harms, MD;  Location: [facility];  Service: Gynecology;  Laterality: Right;     Current Outpatient Medications  Medication Sig Dispense Refill   acetaminophen (TYLENOL) 500 MG tablet Take 500-1,000 mg by mouth every 6 (six) hours as needed for moderate pain or fever.     albuterol (VENTOLIN HFA) 108 (90 Base) MCG/ACT inhaler INHALE 2 PUFFS BY MOUTH 4 TIMES A DAY AS NEEDED 8.5 each 3   amLODipine (NORVASC) 5 MG tablet TAKE 1  TABLET BY MOUTH EVERY DAY 90 tablet 1   aspirin EC 81 MG tablet Take 1 tablet (81 mg total) by mouth daily. 30 tablet    atorvastatin (LIPITOR) 40 MG tablet Take 1 tablet (40 mg total) by mouth daily. 30 tablet 2   clopidogrel (PLAVIX) 75 MG tablet TAKE 1 TABLET BY MOUTH EVERY DAY 90 tablet 1   DULoxetine (CYMBALTA) 60 MG capsule TAKE 1 CAPSULE BY MOUTH EVERY DAY 90 capsule 1   isosorbide mononitrate (IMDUR) 120 MG 24 hr tablet TAKE 1 TABLET BY MOUTH EVERY DAY 90 tablet 3   lisinopril (ZESTRIL) 20 MG tablet TAKE 1 TABLET BY MOUTH EVERY DAY 90 tablet 1   meclizine (ANTIVERT) 25 MG tablet Take 1 tablet (25 mg total) by mouth 3 (three) times daily as needed for  dizziness. 30 tablet 2   metoprolol succinate (TOPROL-XL) 50 MG 24 hr tablet Take 100 mg by mouth daily.     nitroGLYCERIN (NITROSTAT) 0.4 MG SL tablet Place 1 tablet (0.4 mg total) under the tongue every 5 (five) minutes as needed for chest pain. (Patient not taking: Reported on 01/29/2023) 30 tablet 3   ondansetron (ZOFRAN) 4 MG tablet Take 1 tablet (4 mg total) by mouth every 8 (eight) hours as needed for nausea or vomiting. 60 tablet 1   oxybutynin (DITROPAN-XL) 10 MG 24 hr tablet TAKE 1 TABLET BY MOUTH EVERY DAY 90 tablet 3   pantoprazole (PROTONIX) 40 MG tablet TAKE 1 TABLET BY MOUTH TWICE A DAY 180 tablet 1   PREMARIN vaginal cream USE 1 GRAM INTRAVAGINALLY TWICE PER WEEK 30 g 3   traZODone (DESYREL) 50 MG tablet TAKE 2 TABLETS BY MOUTH AT BEDTIME. 180 tablet 1   zanubrutinib (BRUKINSA) 80 MG capsule Take 2 capsules (160 mg total) by mouth 2 (two) times daily. 120 capsule 1   No current facility-administered medications for this visit.    Allergies:   Requip [ropinirole hcl], Celebrex [celecoxib], Meloxicam, Nitrofurantoin, and Ranexa [ranolazine]    Social History:   reports that she has been smoking cigarettes. She has a 35 pack-year smoking history. Her smokeless tobacco use includes snuff. She reports that she does not drink alcohol and does not use drugs.   Family History:  family history includes Cervical cancer in her sister; Congestive Heart Failure in her sister; Heart attack in her brother; Heart disease in her father; Hypertension in her mother; Lung cancer in her brother; Stroke in her mother.    ROS:     Review of Systems  Constitutional: Negative.   HENT: Negative.    Eyes: Negative.   Respiratory: Negative.    Cardiovascular:  Positive for chest pain.  Gastrointestinal: Negative.   Genitourinary: Negative.   Musculoskeletal: Negative.   Skin: Negative.   Neurological: Negative.   Endo/Heme/Allergies: Negative.   Psychiatric/Behavioral: Negative.    All other  systems reviewed and are negative.     All other systems are reviewed and negative.    PHYSICAL EXAM: VS:  BP 122/81   Pulse (!) 59   Ht 5\' 3"  (1.6 m)   Wt 151 lb 9.6 oz (68.8 kg)   SpO2 97%   BMI 26.85 kg/m  , BMI Body mass index is 26.85 kg/m. Last weight:  Wt Readings from Last 3 Encounters:  06/15/23 151 lb 9.6 oz (68.8 kg)  06/11/23 148 lb 12.8 oz (67.5 kg)  05/21/23 148 lb 12.8 oz (67.5 kg)     Physical Exam Constitutional:  Appearance: Normal appearance.  Cardiovascular:     Rate and Rhythm: Normal rate and regular rhythm.     Heart sounds: Normal heart sounds.  Pulmonary:     Effort: Pulmonary effort is normal.     Breath sounds: Normal breath sounds.  Musculoskeletal:     Right lower leg: No edema.     Left lower leg: No edema.  Neurological:     Mental Status: She is alert.       EKG:   Recent Labs: 06/11/2023: ALT 14; BUN 10; Creatinine 0.98; Hemoglobin 10.9; Platelet Count 214; Potassium 3.4; Sodium 137    Lipid Panel    Component Value Date/Time   CHOL 107 05/14/2023 1305   TRIG 101 05/14/2023 1305   HDL 37 (L) 05/14/2023 1305   CHOLHDL 3.2 12/11/2022 1224   CHOLHDL 8.4 08/10/2020 0523   VLDL 17 08/10/2020 0523   LDLCALC 51 05/14/2023 1305      Other studies Reviewed: Additional studies/ records that were reviewed today include:  Review of the above records demonstrates:       No data to display            ASSESSMENT AND PLAN:    ICD-10-CM   1. Coronary artery disease involving native coronary artery of native heart without angina pectoris  I25.10    Stress myoview showed no ischaemia with LVEF 92%    2. Essential hypertension, benign  I10    STABLE    3. Paroxysmal atrial fibrillation (HCC)  I48.0    in NSR    4. Other chest pain  R07.89    STRESS TEST NORMAL. MAY HAVE CHEMOTHERAPY    5. Mixed hyperlipidemia  E78.2     6. Nonrheumatic mitral valve regurgitation  I34.0    MILD TO MODERATE MR, SEVERLEY DILATED  LA, NORMAL LVEF       Problem List Items Addressed This Visit       Cardiovascular and Mediastinum   Coronary artery disease - Primary   Paroxysmal atrial fibrillation (HCC)   Essential hypertension, benign     Other   Mixed hyperlipidemia   Chest pain   Other Visit Diagnoses     Nonrheumatic mitral valve regurgitation       MILD TO MODERATE MR, SEVERLEY DILATED LA, NORMAL LVEF          Disposition:   Return in about 3 months (around 09/15/2023).    Total time spent: 30 minutes  Signed,  Karen Blackwater, MD  06/15/2023 12:07 PM    [facility]

## 2023-06-19 ENCOUNTER — Ambulatory Visit
Admission: RE | Admit: 2023-06-19 | Discharge: 2023-06-19 | Disposition: A | Discharge status: Home / Self Care | Payer: 59 | Source: Ambulatory Visit | Attending: Internal Medicine | Admitting: Internal Medicine

## 2023-06-19 ENCOUNTER — Other Ambulatory Visit: Payer: 59

## 2023-06-19 ENCOUNTER — Ambulatory Visit: Payer: 59 | Admitting: Internal Medicine

## 2023-06-19 DIAGNOSIS — Z1231 Encounter for screening mammogram for malignant neoplasm of breast: Secondary | ICD-10-CM | POA: Insufficient documentation

## 2023-07-03 ENCOUNTER — Ambulatory Visit: Payer: 59 | Admitting: Cardiology

## 2023-07-03 ENCOUNTER — Ambulatory Visit (INDEPENDENT_AMBULATORY_CARE_PROVIDER_SITE_OTHER): Payer: 59 | Admitting: Internal Medicine

## 2023-07-03 ENCOUNTER — Encounter: Payer: Self-pay | Admitting: Internal Medicine

## 2023-07-03 ENCOUNTER — Other Ambulatory Visit: Payer: 59

## 2023-07-03 VITALS — BP 140/80 | HR 60 | Ht 62.0 in | Wt 150.8 lb

## 2023-07-03 DIAGNOSIS — F411 Generalized anxiety disorder: Secondary | ICD-10-CM | POA: Diagnosis not present

## 2023-07-03 DIAGNOSIS — Z720 Tobacco use: Secondary | ICD-10-CM

## 2023-07-03 DIAGNOSIS — E782 Mixed hyperlipidemia: Secondary | ICD-10-CM | POA: Diagnosis not present

## 2023-07-03 DIAGNOSIS — J209 Acute bronchitis, unspecified: Secondary | ICD-10-CM | POA: Diagnosis not present

## 2023-07-03 DIAGNOSIS — I10 Essential (primary) hypertension: Secondary | ICD-10-CM

## 2023-07-03 MED ORDER — LEVOFLOXACIN 500 MG PO TABS: 500.0000 mg | ORAL_TABLET | Freq: Every day | ORAL | 0 refills | Status: AC

## 2023-07-03 MED ORDER — METHYLPREDNISOLONE 4 MG PO TBPK: ORAL_TABLET | ORAL | 0 refills | Status: DC

## 2023-07-03 NOTE — Progress Notes (Signed)
Established Patient Office Visit  Subjective:  Patient ID: Karen [name], female    DOB: January 09, 1946  Age: 77 y.o. MRN: [id number]  Chief Complaint  Patient presents with   Follow-up    1 mo    Patient comes in with few days history of chest and sinus congestion, postnasal drip, cough with greenish sputum and chest tightness.  Patient has been using over-the-counter products along with her albuterol inhaler.  Does not have any fever but feels tired and has chills.  No nausea vomiting and no body aches.  Patient continues to smoke heavy. Will send in prescription for p.o. Levaquin, Medrol Dosepak and trial of Breztri inhaler.    No other concerns at this time.   Past Medical History:  Diagnosis Date   Anxiety    Artery occlusion    Asthma    Back pain    Bronchitis    Cancer (HCC)    Skin Cancer   CLL (chronic lymphocytic leukemia) (HCC)    Coronary artery disease    Depression    Dyspnea    GERD (gastroesophageal reflux disease)    Herpes    History of kidney stones    Hypercholesteremia    Hyperlipemia    Hypertension    Hypothyroidism    Myocardial infarction (HCC) 1999   Neuropathy    Restless leg syndrome    Sleep apnea    use C-PAP   Wears dentures    full upper, partial lower.  doesn't currently wear    Past Surgical History:  Procedure Laterality Date   COLONOSCOPY WITH PROPOFOL N/A 11/20/2016   Procedure: COLONOSCOPY WITH PROPOFOL;  Surgeon: Midge Minium, MD;  Location: [facility];  Service: Endoscopy;  Laterality: N/A;  sleep apnea   CORONARY ANGIOPLASTY WITH STENT PLACEMENT  2008   DILATION AND CURETTAGE OF UTERUS     ESOPHAGOGASTRODUODENOSCOPY (EGD) WITH PROPOFOL N/A 03/09/2022   Procedure: ESOPHAGOGASTRODUODENOSCOPY (EGD) WITH PROPOFOL;  Surgeon: Midge Minium, MD;  Location: [facility];  Service: Endoscopy;  Laterality: N/A;   EYE SURGERY Bilateral    Cataract Extraction with IOL   FRACTURE SURGERY     HAND SURGERY Left 12/2017    upper hand fracture repair   LEFT HEART CATH AND CORONARY ANGIOGRAPHY N/A 06/29/2017   Procedure: LEFT HEART CATH AND CORONARY ANGIOGRAPHY;  Surgeon: Laurier Nancy, MD;  Location: [facility];  Service: Cardiovascular;  Laterality: N/A;   LEFT HEART CATH AND CORONARY ANGIOGRAPHY Left 12/02/2018   Procedure: LEFT HEART CATH AND CORONARY ANGIOGRAPHY;  Surgeon: Laurier Nancy, MD;  Location: [facility];  Service: Cardiovascular;  Laterality: Left;   OPEN REDUCTION INTERNAL FIXATION (ORIF) DISTAL RADIAL FRACTURE Right 08/12/2018   Procedure: OPEN REDUCTION INTERNAL FIXATION (ORIF) DISTAL RADIAL FRACTURE;  Surgeon: Deeann Saint, MD;  Location: [facility];  Service: Orthopedics;  Laterality: Right;   POLYPECTOMY  11/20/2016   Procedure: POLYPECTOMY;  Surgeon: Midge Minium, MD;  Location: [facility];  Service: Endoscopy;;   TUBAL LIGATION     VAGINAL HYSTERECTOMY     VULVECTOMY Right 01/31/2016   Procedure: WIDE EXCISION VULVECTOMY-RIGHT LABIA;  Surgeon: Herold Harms, MD;  Location: [facility];  Service: Gynecology;  Laterality: Right;    Social History   Socioeconomic History   Marital status: Single    Spouse name: Not on file   Number of children: 5   Years of education: Not on file   Highest education level: Not on file  Occupational  History   Occupation: part time job    Comment: Landscape architect  Tobacco Use   Smoking status: Every Day    Current packs/day: 1.00    Average packs/day: 1 pack/day for 35.0 years (35.0 ttl pk-yrs)    Types: Cigarettes   Smokeless tobacco: Current    Types: Snuff  Vaping Use   Vaping status: Never Used  Substance and Sexual Activity   Alcohol use: No    Alcohol/week: 0.0 standard drinks of alcohol   Drug use: No   Sexual activity: Not Currently    Partners: Male    Birth control/protection: Surgical  Other Topics Concern   Not on file  Social History Narrative   Not on file   Social Determinants of Health    Financial Resource Strain: Low Risk  (12/02/2018)   Overall Financial Resource Strain (CARDIA)    Difficulty of Paying Living Expenses: Not very hard  Food Insecurity: No Food Insecurity (05/14/2023)   Hunger Vital Sign    Worried About Running Out of Food in the Last Year: Never true    Ran Out of Food in the Last Year: Never true  Transportation Needs: No Transportation Needs (05/14/2023)   PRAPARE - Administrator, Civil Service (Medical): No    Lack of Transportation (Non-Medical): No  Physical Activity: Inactive (05/14/2023)   Exercise Vital Sign    Days of Exercise per Week: 0 days    Minutes of Exercise per Session: 0 min  Stress: No Stress Concern Present (05/14/2023)   [employer] of Occupational Health - Occupational Stress Questionnaire    Feeling of Stress : Only a little  Social Connections: Unknown (12/02/2018)   Social Connection and Isolation Panel [NHANES]    Frequency of Communication with Friends and Family: More than three times a week    Frequency of Social Gatherings with Friends and Family: Not on file    Attends Religious Services: Not on file    Active Member of Clubs or Organizations: Not on file    Attends Banker Meetings: Not on file    Marital Status: Not on file  Intimate Partner Violence: Not At Risk (05/14/2023)   Humiliation, Afraid, Rape, and Kick questionnaire    Fear of Current or Ex-Partner: No    Emotionally Abused: No    Physically Abused: No    Sexually Abused: No    Family History  Problem Relation Age of Onset   Heart disease Father    Stroke Mother    Hypertension Mother    Lung cancer Brother    Heart attack Brother    Congestive Heart Failure Sister    Cervical cancer Sister    Cancer Neg Hx    Diabetes Neg Hx    Breast cancer Neg Hx     Allergies  Allergen Reactions   Requip [Ropinirole Hcl] Anaphylaxis    Throat closing   Celebrex [Celecoxib] Itching   Meloxicam Palpitations    Nitrofurantoin Palpitations   Ranexa [Ranolazine] Hives    Outpatient Medications Prior to Visit  Medication Sig   acetaminophen (TYLENOL) 500 MG tablet Take 500-1,000 mg by mouth every 6 (six) hours as needed for moderate pain or fever.   albuterol (VENTOLIN HFA) 108 (90 Base) MCG/ACT inhaler INHALE 2 PUFFS BY MOUTH 4 TIMES A DAY AS NEEDED   amLODipine (NORVASC) 5 MG tablet TAKE 1 TABLET BY MOUTH EVERY DAY   aspirin EC 81 MG tablet Take 1 tablet (81 mg total)  by mouth daily.   atorvastatin (LIPITOR) 40 MG tablet Take 1 tablet (40 mg total) by mouth daily.   clopidogrel (PLAVIX) 75 MG tablet TAKE 1 TABLET BY MOUTH EVERY DAY   DULoxetine (CYMBALTA) 60 MG capsule TAKE 1 CAPSULE BY MOUTH EVERY DAY   isosorbide mononitrate (IMDUR) 120 MG 24 hr tablet TAKE 1 TABLET BY MOUTH EVERY DAY   lisinopril (ZESTRIL) 20 MG tablet TAKE 1 TABLET BY MOUTH EVERY DAY   meclizine (ANTIVERT) 25 MG tablet Take 1 tablet (25 mg total) by mouth 3 (three) times daily as needed for dizziness.   metoprolol succinate (TOPROL-XL) 50 MG 24 hr tablet Take 100 mg by mouth daily.   ondansetron (ZOFRAN) 4 MG tablet Take 1 tablet (4 mg total) by mouth every 8 (eight) hours as needed for nausea or vomiting.   oxybutynin (DITROPAN-XL) 10 MG 24 hr tablet TAKE 1 TABLET BY MOUTH EVERY DAY   pantoprazole (PROTONIX) 40 MG tablet TAKE 1 TABLET BY MOUTH TWICE A DAY   PREMARIN vaginal cream USE 1 GRAM INTRAVAGINALLY TWICE PER WEEK   traZODone (DESYREL) 50 MG tablet TAKE 2 TABLETS BY MOUTH AT BEDTIME.   zanubrutinib (BRUKINSA) 80 MG capsule Take 2 capsules (160 mg total) by mouth 2 (two) times daily.   nitroGLYCERIN (NITROSTAT) 0.4 MG SL tablet Place 1 tablet (0.4 mg total) under the tongue every 5 (five) minutes as needed for chest pain. (Patient not taking: Reported on 01/29/2023)   No facility-administered medications prior to visit.    Review of Systems  Constitutional:  Positive for malaise/fatigue.  HENT:  Positive for congestion  and sinus pain. Negative for hearing loss and sore throat.   Eyes: Negative.   Respiratory:  Positive for cough, sputum production and wheezing. Negative for shortness of breath.   Cardiovascular: Negative.  Negative for chest pain, palpitations and leg swelling.  Gastrointestinal: Negative.  Negative for abdominal pain, constipation, diarrhea, heartburn, nausea and vomiting.  Genitourinary: Negative.  Negative for dysuria and flank pain.  Musculoskeletal: Negative.  Negative for joint pain and myalgias.  Skin: Negative.   Neurological: Negative.  Negative for dizziness, tingling, tremors, sensory change and headaches.  Endo/Heme/Allergies: Negative.   Psychiatric/Behavioral: Negative.  Negative for depression and suicidal ideas. The patient is not nervous/anxious.        Objective:   BP (!) 140/80   Pulse 60   Ht 5\' 2"  (1.575 m)   Wt 150 lb 12.8 oz (68.4 kg)   SpO2 94%   BMI 27.58 kg/m   Vitals:   07/03/23 1547  BP: (!) 140/80  Pulse: 60  Height: 5\' 2"  (1.575 m)  Weight: 150 lb 12.8 oz (68.4 kg)  SpO2: 94%  BMI (Calculated): 27.57    Physical Exam Vitals and nursing note reviewed.  Constitutional:      Appearance: Normal appearance.  HENT:     Head: Normocephalic and atraumatic.     Nose: Nose normal.     Mouth/Throat:     Mouth: Mucous membranes are moist.     Pharynx: Oropharynx is clear.  Eyes:     Conjunctiva/sclera: Conjunctivae normal.     Pupils: Pupils are equal, round, and reactive to light.  Cardiovascular:     Rate and Rhythm: Normal rate and regular rhythm.     Pulses: Normal pulses.     Heart sounds: Normal heart sounds. No murmur heard. Pulmonary:     Effort: Pulmonary effort is normal. No respiratory distress.     Breath sounds:  Wheezing present. No rhonchi or rales.  Abdominal:     General: Bowel sounds are normal.     Palpations: Abdomen is soft.     Tenderness: There is no abdominal tenderness. There is no right CVA tenderness or left CVA  tenderness.  Musculoskeletal:        General: Normal range of motion.     Cervical back: Normal range of motion.     Right lower leg: No edema.     Left lower leg: No edema.  Skin:    General: Skin is warm and dry.  Neurological:     General: No focal deficit present.     Mental Status: She is alert and oriented to person, place, and time.  Psychiatric:        Mood and Affect: Mood normal.        Behavior: Behavior normal.      No results found for any visits on 07/03/23.  Recent Results (from the past 2160 hour(s))  POCT Urinalysis Dipstick ([address])     Status: Abnormal   Collection Time: 04/23/23  1:31 PM  Result Value Ref Range   Color, UA yellow    Clarity, UA cloudy    Glucose, UA Negative Negative   Bilirubin, UA neg    Ketones, UA neg    Spec Grav, UA 1.020 1.010 - 1.025   Blood, UA small    pH, UA 6.0 5.0 - 8.0   Protein, UA Positive (A) Negative   Urobilinogen, UA 0.2 0.2 or 1.0 E.U./dL   Nitrite, UA pos    Leukocytes, UA Large (3+) (A) Negative   Appearance cloudy    Odor yes   Urine Culture     Status: Abnormal   Collection Time: 04/23/23  3:21 PM   Specimen: Urine   UC  Result Value Ref Range   Urine Culture, Routine Final report (A)    Organism ID, Bacteria Escherichia coli (A)     Comment: Cefazolin <=4 ug/mL Cefazolin with an MIC <=16 predicts susceptibility to the oral agents cefaclor, cefdinir, cefpodoxime, cefprozil, cefuroxime, cephalexin, and loracarbef when used for therapy of uncomplicated urinary tract infections due to E. coli, Klebsiella pneumoniae, and Proteus mirabilis. Greater than 100,000 colony forming units per mL    Antimicrobial Susceptibility Comment     Comment:       ** S = Susceptible; I = Intermediate; R = Resistant **                    P = Positive; N = Negative             MICS are expressed in micrograms per mL    Antibiotic                 RSLT#1    RSLT#2    RSLT#3    RSLT#4 Amoxicillin/Clavulanic Acid     S Ampicillin                     S Cefepime                       S Ceftriaxone                    S Cefuroxime                     S Ciprofloxacin  S Ertapenem                      S Gentamicin                     S Imipenem                       S Levofloxacin                   S Meropenem                      S Nitrofurantoin                 S Piperacillin/Tazobactam        S Tetracycline                   S Tobramycin                     S Trimethoprim/Sulfa             S   Urinalysis     Status: Abnormal   Collection Time: 04/23/23  3:23 PM  Result Value Ref Range   Specific Gravity, UA 1.016 1.005 - 1.030   pH, UA 5.5 5.0 - 7.5   Color, UA Yellow Yellow   Appearance Ur Turbid (A) Clear   Leukocytes,UA 3+ (A) Negative   Protein,UA 1+ (A) Negative/Trace   Glucose, UA Negative Negative   Ketones, UA Negative Negative   RBC, UA 1+ (A) Negative   Bilirubin, UA Negative Negative   Urobilinogen, Ur 0.2 0.2 - 1.0 mg/dL   Nitrite, UA Negative Negative  CMP14+EGFR     Status: Abnormal   Collection Time: 05/14/23  1:05 PM  Result Value Ref Range   Glucose 90 70 - 99 mg/dL   BUN 11 8 - 27 mg/dL   Creatinine, Ser 1.61 (H) 0.57 - 1.00 mg/dL   eGFR 41 (L) >09 UE/AVW/0.98   BUN/Creatinine Ratio 8 (L) 12 - 28   Sodium 138 134 - 144 mmol/L   Potassium 4.3 3.5 - 5.2 mmol/L   Chloride 102 96 - 106 mmol/L   CO2 23 20 - 29 mmol/L   Calcium 9.6 8.7 - 10.3 mg/dL   Total Protein 6.1 6.0 - 8.5 g/dL   Albumin 4.3 3.8 - 4.8 g/dL   Globulin, Total 1.8 1.5 - 4.5 g/dL   Bilirubin Total 0.4 0.0 - 1.2 mg/dL   Alkaline Phosphatase 83 44 - 121 IU/L   AST 14 0 - 40 IU/L   ALT 16 0 - 32 IU/L  Lipid Panel w/o Chol/HDL Ratio     Status: Abnormal   Collection Time: 05/14/23  1:05 PM  Result Value Ref Range   Cholesterol, Total 107 100 - 199 mg/dL   Triglycerides 119 0 - 149 mg/dL   HDL 37 (L) >14 mg/dL   VLDL Cholesterol Cal 19 5 - 40 mg/dL   LDL Chol Calc (NIH) 51 0 - 99  mg/dL  Hemoglobin N8G     Status: Abnormal   Collection Time: 05/14/23  1:05 PM  Result Value Ref Range   Hgb A1c MFr Bld 5.8 (H) 4.8 - 5.6 %    Comment:          Prediabetes: 5.7 - 6.4          Diabetes: >6.4  Glycemic control for adults with diabetes: <7.0    Est. average glucose Bld gHb Est-mCnc 120 mg/dL  Lactate dehydrogenase     Status: None   Collection Time: 06/11/23  1:03 PM  Result Value Ref Range   LDH 130 98 - 192 U/L    Comment: Performed at [facility], [address] [place], [place] [address]  CBC with Differential (Cancer Center Only)     Status: Abnormal   Collection Time: 06/11/23  1:03 PM  Result Value Ref Range   WBC Count 55.4 (HH) 4.0 - 10.5 K/uL    Comment: REPEATED TO VERIFY THIS CRITICAL RESULT HAS VERIFIED AND BEEN CALLED TO SANTOS,E BY CHASE ISLEY ON 11 18 2024 AT 1327, AND HAS BEEN READ BACK.     RBC 3.57 (L) 3.87 - 5.11 MIL/uL   Hemoglobin 10.9 (L) 12.0 - 15.0 g/dL   HCT 60.4 (L) 54.0 - 98.1 %   MCV 96.4 80.0 - 100.0 fL   MCH 30.5 26.0 - 34.0 pg   MCHC 31.7 30.0 - 36.0 g/dL   RDW 19.1 47.8 - 29.5 %   Platelet Count 214 150 - 400 K/uL   nRBC 0.0 0.0 - 0.2 %   Neutrophils Relative % 8 %   Neutro Abs 4.3 1.7 - 7.7 K/uL   Lymphocytes Relative 85 %   Lymphs Abs 47.3 (H) 0.7 - 4.0 K/uL   Monocytes Relative 6 %   Monocytes Absolute 3.2 (H) 0.1 - 1.0 K/uL   Eosinophils Relative 1 %   Eosinophils Absolute 0.3 0.0 - 0.5 K/uL   Basophils Relative 0 %   Basophils Absolute 0.2 (H) 0.0 - 0.1 K/uL   WBC Morphology      Lymphocytosis with morphology consistent with known diagnosis of CLL.    Comment: SMUDGE CELLS DIFF. CONFIRMED BY SMEAR    RBC Morphology MORPHOLOGY UNREMARKABLE    Smear Review Normal platelet morphology     Comment: PLATELETS APPEAR ADEQUATE   Immature Granulocytes 0 %   Abs Immature Granulocytes 0.15 (H) 0.00 - 0.07 K/uL    Comment: Performed at [facility], [address] [place], [place] [address]  CMP  (Cancer Center only)     Status: Abnormal   Collection Time: 06/11/23  1:04 PM  Result Value Ref Range   Sodium 137 135 - 145 mmol/L   Potassium 3.4 (L) 3.5 - 5.1 mmol/L   Chloride 100 98 - 111 mmol/L   CO2 28 22 - 32 mmol/L   Glucose, Bld 111 (H) 70 - 99 mg/dL    Comment: Glucose reference range applies only to samples taken after fasting for at least 8 hours.   BUN 10 8 - 23 mg/dL   Creatinine 8.65 7.84 - 1.00 mg/dL   Calcium 9.4 8.9 - 69.6 mg/dL   Total Protein 6.6 6.5 - 8.1 g/dL   Albumin 3.9 3.5 - 5.0 g/dL   AST 13 (L) 15 - 41 U/L   ALT 14 0 - 44 U/L   Alkaline Phosphatase 83 38 - 126 U/L   Total Bilirubin 0.3 <1.2 mg/dL   GFR, Estimated 59 (L) >60 mL/min    Comment: (NOTE) Calculated using the CKD-EPI Creatinine Equation (2021)    Anion gap 9 5 - 15    Comment: Performed at [facility], [address], [place], [place] [address]      Assessment & Plan:  Patient will start prednisone Dosepak, Levaquin.  There is a tree inhaler twice  a day. Return in 1 week or sooner if not better, we will check a chest x-ray. Problem List Items Addressed This Visit     Tobacco abuse   Relevant Medications   methylPREDNISolone (MEDROL DOSEPAK) 4 MG TBPK tablet   Essential hypertension, benign - Primary   Mixed hyperlipidemia   GAD (generalized anxiety disorder)   Other Visit Diagnoses     Acute bronchitis, unspecified organism       Relevant Medications   levofloxacin (LEVAQUIN) 500 MG tablet   methylPREDNISolone (MEDROL DOSEPAK) 4 MG TBPK tablet       Return in about 1 week (around 07/10/2023).   Total time spent: 30 minutes  Margaretann Loveless, MD  07/03/2023   This document may have been prepared by [facility] Voice Recognition software and as such may include unintentional dictation errors.

## 2023-07-04 ENCOUNTER — Other Ambulatory Visit: Payer: 59

## 2023-07-06 ENCOUNTER — Other Ambulatory Visit: Payer: Self-pay | Admitting: Cardiovascular Disease

## 2023-07-09 ENCOUNTER — Inpatient Hospital Stay: Payer: 59 | Attending: Oncology

## 2023-07-09 ENCOUNTER — Inpatient Hospital Stay: Payer: 59 | Admitting: Oncology

## 2023-07-10 ENCOUNTER — Encounter: Payer: Self-pay | Admitting: Internal Medicine

## 2023-07-10 ENCOUNTER — Ambulatory Visit (INDEPENDENT_AMBULATORY_CARE_PROVIDER_SITE_OTHER): Payer: 59 | Admitting: Internal Medicine

## 2023-07-10 VITALS — BP 121/84 | HR 67 | Ht 62.0 in | Wt 152.8 lb

## 2023-07-10 DIAGNOSIS — M25561 Pain in right knee: Secondary | ICD-10-CM

## 2023-07-10 DIAGNOSIS — I10 Essential (primary) hypertension: Secondary | ICD-10-CM | POA: Diagnosis not present

## 2023-07-10 DIAGNOSIS — F411 Generalized anxiety disorder: Secondary | ICD-10-CM | POA: Diagnosis not present

## 2023-07-10 DIAGNOSIS — R7303 Prediabetes: Secondary | ICD-10-CM | POA: Insufficient documentation

## 2023-07-10 DIAGNOSIS — Z720 Tobacco use: Secondary | ICD-10-CM

## 2023-07-10 DIAGNOSIS — E782 Mixed hyperlipidemia: Secondary | ICD-10-CM

## 2023-07-10 NOTE — Progress Notes (Signed)
Established Patient Office Visit  Subjective:  Patient ID: Karen [name], female    DOB: [birth year]  Age: 77 y.o. MRN: [id number]  Chief Complaint  Patient presents with   Follow-up    Right knee pain Unable to bare a lot of weight on it      Patient comes in for her follow-up as well has a new complaint of right knee pain and instability.  Patient was seen recently for chest congestion and cough for which she has completed her prednisone, antibiotic and is using Breztri inhaler.  Her chest symptoms have almost completely resolved, with a very mild residual cough.  No chest pain and no shortness of breath. She was also found to have urinary tract infection-with she was already started on Levaquin. However she noticed some pain and tenderness along the medial side of her right knee.  She does not recall any injury or twisting her knee but it feels unsteady and unstable when she puts weight on it.  She reports that she takes Tylenol and it helps eases up the pain but the instability persists.  Patient is allergic to nonsteroidals.  Will set up an orthopedic consult, meanwhile wear a  knee brace.    No other concerns at this time.   Past Medical History:  Diagnosis Date   Anxiety    Artery occlusion    Asthma    Back pain    Bronchitis    Cancer (HCC)    Skin Cancer   CLL (chronic lymphocytic leukemia) (HCC)    Coronary artery disease    Depression    Dyspnea    GERD (gastroesophageal reflux disease)    Herpes    History of kidney stones    Hypercholesteremia    Hyperlipemia    Hypertension    Hypothyroidism    Myocardial infarction (HCC) 1999   Neuropathy    Restless leg syndrome    Sleep apnea    use C-PAP   Wears dentures    full upper, partial lower.  doesn't currently wear    Past Surgical History:  Procedure Laterality Date   COLONOSCOPY WITH PROPOFOL N/A 11/20/2016   Procedure: COLONOSCOPY WITH PROPOFOL;  Surgeon: Midge Minium, MD;  Location: [facility];  Service: Endoscopy;  Laterality: N/A;  sleep apnea   CORONARY ANGIOPLASTY WITH STENT PLACEMENT  2008   DILATION AND CURETTAGE OF UTERUS     ESOPHAGOGASTRODUODENOSCOPY (EGD) WITH PROPOFOL N/A 03/09/2022   Procedure: ESOPHAGOGASTRODUODENOSCOPY (EGD) WITH PROPOFOL;  Surgeon: Midge Minium, MD;  Location: [facility];  Service: Endoscopy;  Laterality: N/A;   EYE SURGERY Bilateral    Cataract Extraction with IOL   FRACTURE SURGERY     HAND SURGERY Left 12/2017   upper hand fracture repair   LEFT HEART CATH AND CORONARY ANGIOGRAPHY N/A 06/29/2017   Procedure: LEFT HEART CATH AND CORONARY ANGIOGRAPHY;  Surgeon: Laurier Nancy, MD;  Location: [facility];  Service: Cardiovascular;  Laterality: N/A;   LEFT HEART CATH AND CORONARY ANGIOGRAPHY Left 12/02/2018   Procedure: LEFT HEART CATH AND CORONARY ANGIOGRAPHY;  Surgeon: Laurier Nancy, MD;  Location: [facility];  Service: Cardiovascular;  Laterality: Left;   OPEN REDUCTION INTERNAL FIXATION (ORIF) DISTAL RADIAL FRACTURE Right 08/12/2018   Procedure: OPEN REDUCTION INTERNAL FIXATION (ORIF) DISTAL RADIAL FRACTURE;  Surgeon: Deeann Saint, MD;  Location: [facility];  Service: Orthopedics;  Laterality: Right;   POLYPECTOMY  11/20/2016   Procedure: POLYPECTOMY;  Surgeon: Midge Minium, MD;  Location: [facility];  Service: Endoscopy;;   TUBAL LIGATION     VAGINAL HYSTERECTOMY     VULVECTOMY Right 01/31/2016   Procedure: WIDE EXCISION VULVECTOMY-RIGHT LABIA;  Surgeon: Herold Harms, MD;  Location: [facility];  Service: Gynecology;  Laterality: Right;    Social History   Socioeconomic History   Marital status: Single    Spouse name: Not on file   Number of children: 5   Years of education: Not on file   Highest education level: Not on file  Occupational History   Occupation: part time job    Comment: [employer]  Tobacco Use   Smoking status: Every Day    Current packs/day: 1.00    Average packs/day: 1  pack/day for 35.0 years (35.0 ttl pk-yrs)    Types: Cigarettes   Smokeless tobacco: Current    Types: Snuff  Vaping Use   Vaping status: Never Used  Substance and Sexual Activity   Alcohol use: No    Alcohol/week: 0.0 standard drinks of alcohol   Drug use: No   Sexual activity: Not Currently    Partners: Male    Birth control/protection: Surgical  Other Topics Concern   Not on file  Social History Narrative   Not on file   Social Drivers of Health   Financial Resource Strain: Low Risk  (12/02/2018)   Overall Financial Resource Strain (CARDIA)    Difficulty of Paying Living Expenses: Not very hard  Food Insecurity: No Food Insecurity (05/14/2023)   Hunger Vital Sign    Worried About Running Out of Food in the Last Year: Never true    Ran Out of Food in the Last Year: Never true  Transportation Needs: No Transportation Needs (05/14/2023)   PRAPARE - Administrator, Civil Service (Medical): No    Lack of Transportation (Non-Medical): No  Physical Activity: Inactive (05/14/2023)   Exercise Vital Sign    Days of Exercise per Week: 0 days    Minutes of Exercise per Session: 0 min  Stress: No Stress Concern Present (05/14/2023)   [employer] of Occupational Health - Occupational Stress Questionnaire    Feeling of Stress : Only a little  Social Connections: Unknown (12/02/2018)   Social Connection and Isolation Panel [NHANES]    Frequency of Communication with Friends and Family: More than three times a week    Frequency of Social Gatherings with Friends and Family: Not on file    Attends Religious Services: Not on file    Active Member of Clubs or Organizations: Not on file    Attends Banker Meetings: Not on file    Marital Status: Not on file  Intimate Partner Violence: Not At Risk (05/14/2023)   Humiliation, Afraid, Rape, and Kick questionnaire    Fear of Current or Ex-Partner: No    Emotionally Abused: No    Physically Abused: No    Sexually  Abused: No    Family History  Problem Relation Age of Onset   Heart disease Father    Stroke Mother    Hypertension Mother    Lung cancer Brother    Heart attack Brother    Congestive Heart Failure Sister    Cervical cancer Sister    Cancer Neg Hx    Diabetes Neg Hx    Breast cancer Neg Hx     Allergies  Allergen Reactions   Requip [Ropinirole Hcl] Anaphylaxis    Throat closing   Celebrex [Celecoxib] Itching  Meloxicam Palpitations   Nitrofurantoin Palpitations   Ranexa [Ranolazine] Hives    Outpatient Medications Prior to Visit  Medication Sig   acetaminophen (TYLENOL) 500 MG tablet Take 500-1,000 mg by mouth every 6 (six) hours as needed for moderate pain or fever.   albuterol (VENTOLIN HFA) 108 (90 Base) MCG/ACT inhaler INHALE 2 PUFFS BY MOUTH 4 TIMES A DAY AS NEEDED   amLODipine (NORVASC) 5 MG tablet TAKE 1 TABLET BY MOUTH EVERY DAY   aspirin EC 81 MG tablet Take 1 tablet (81 mg total) by mouth daily.   atorvastatin (LIPITOR) 40 MG tablet Take 1 tablet (40 mg total) by mouth daily.   clopidogrel (PLAVIX) 75 MG tablet TAKE 1 TABLET BY MOUTH EVERY DAY   DULoxetine (CYMBALTA) 60 MG capsule TAKE 1 CAPSULE BY MOUTH EVERY DAY   isosorbide mononitrate (IMDUR) 120 MG 24 hr tablet TAKE 1 TABLET BY MOUTH EVERY DAY   levofloxacin (LEVAQUIN) 500 MG tablet Take 1 tablet (500 mg total) by mouth daily for 7 days.   lisinopril (ZESTRIL) 20 MG tablet TAKE 1 TABLET BY MOUTH EVERY DAY   meclizine (ANTIVERT) 25 MG tablet Take 1 tablet (25 mg total) by mouth 3 (three) times daily as needed for dizziness.   methylPREDNISolone (MEDROL DOSEPAK) 4 MG TBPK tablet Use as directed.   metoprolol succinate (TOPROL-XL) 50 MG 24 hr tablet Take 100 mg by mouth daily.   nitroGLYCERIN (NITROSTAT) 0.4 MG SL tablet Place 1 tablet (0.4 mg total) under the tongue every 5 (five) minutes as needed for chest pain. (Patient not taking: Reported on 01/29/2023)   ondansetron (ZOFRAN) 4 MG tablet Take 1 tablet (4 mg  total) by mouth every 8 (eight) hours as needed for nausea or vomiting.   oxybutynin (DITROPAN-XL) 10 MG 24 hr tablet TAKE 1 TABLET BY MOUTH EVERY DAY   pantoprazole (PROTONIX) 40 MG tablet TAKE 1 TABLET BY MOUTH TWICE A DAY   PREMARIN vaginal cream USE 1 GRAM INTRAVAGINALLY TWICE PER WEEK   traZODone (DESYREL) 50 MG tablet TAKE 2 TABLETS BY MOUTH AT BEDTIME.   zanubrutinib (BRUKINSA) 80 MG capsule Take 2 capsules (160 mg total) by mouth 2 (two) times daily.   No facility-administered medications prior to visit.    Review of Systems  Constitutional: Negative.  Negative for chills, diaphoresis, fever, malaise/fatigue and weight loss.  HENT: Negative.  Negative for congestion and sore throat.   Eyes: Negative.   Respiratory: Negative.  Negative for cough, sputum production, shortness of breath and wheezing.   Cardiovascular: Negative.  Negative for chest pain, palpitations and leg swelling.  Gastrointestinal: Negative.  Negative for abdominal pain, constipation, diarrhea, heartburn, nausea and vomiting.  Genitourinary: Negative.  Negative for dysuria and flank pain.  Musculoskeletal:  Positive for joint pain (Right knee pain.). Negative for myalgias.  Skin: Negative.   Neurological: Negative.  Negative for dizziness and headaches.  Endo/Heme/Allergies: Negative.   Psychiatric/Behavioral: Negative.  Negative for depression and suicidal ideas. The patient is not nervous/anxious.        Objective:   BP 121/84   Pulse 67   Ht 5\' 2"  (1.575 m)   Wt 152 lb 12.8 oz (69.3 kg)   SpO2 98%   BMI 27.95 kg/m   Vitals:   07/10/23 1440  BP: 121/84  Pulse: 67  Height: 5\' 2"  (1.575 m)  Weight: 152 lb 12.8 oz (69.3 kg)  SpO2: 98%  BMI (Calculated): 27.94    Physical Exam Vitals and nursing note reviewed.  Constitutional:  Appearance: Normal appearance.  HENT:     Head: Normocephalic and atraumatic.     Nose: Nose normal.     Mouth/Throat:     Mouth: Mucous membranes are moist.      Pharynx: Oropharynx is clear.  Eyes:     Conjunctiva/sclera: Conjunctivae normal.     Pupils: Pupils are equal, round, and reactive to light.  Cardiovascular:     Rate and Rhythm: Normal rate and regular rhythm.     Pulses: Normal pulses.     Heart sounds: Normal heart sounds. No murmur heard. Pulmonary:     Effort: Pulmonary effort is normal.     Breath sounds: Normal breath sounds. No wheezing.  Abdominal:     General: Bowel sounds are normal.     Palpations: Abdomen is soft.     Tenderness: There is no abdominal tenderness. There is no right CVA tenderness or left CVA tenderness.  Musculoskeletal:        General: Tenderness (medial side of right knee) present. No swelling, deformity or signs of injury. Normal range of motion.     Cervical back: Normal range of motion.     Right lower leg: No edema.     Left lower leg: No edema.  Skin:    General: Skin is warm and dry.  Neurological:     General: No focal deficit present.     Mental Status: She is alert and oriented to person, place, and time.  Psychiatric:        Mood and Affect: Mood normal.        Behavior: Behavior normal.      No results found for any visits on 07/10/23.  Recent Results (from the past 2160 hours)  POCT Urinalysis Dipstick (09811)     Status: Abnormal   Collection Time: 04/23/23  1:31 PM  Result Value Ref Range   Color, UA yellow    Clarity, UA cloudy    Glucose, UA Negative Negative   Bilirubin, UA neg    Ketones, UA neg    Spec Grav, UA 1.020 1.010 - 1.025   Blood, UA small    pH, UA 6.0 5.0 - 8.0   Protein, UA Positive (A) Negative   Urobilinogen, UA 0.2 0.2 or 1.0 E.U./dL   Nitrite, UA pos    Leukocytes, UA Large (3+) (A) Negative   Appearance cloudy    Odor yes   Urine Culture     Status: Abnormal   Collection Time: 04/23/23  3:21 PM   Specimen: Urine   UC  Result Value Ref Range   Urine Culture, Routine Final report (A)    Organism ID, Bacteria Escherichia coli (A)      Comment: Cefazolin <=4 ug/mL Cefazolin with an MIC <=16 predicts susceptibility to the oral agents cefaclor, cefdinir, cefpodoxime, cefprozil, cefuroxime, cephalexin, and loracarbef when used for therapy of uncomplicated urinary tract infections due to E. coli, Klebsiella pneumoniae, and Proteus mirabilis. Greater than 100,000 colony forming units per mL    Antimicrobial Susceptibility Comment     Comment:       ** S = Susceptible; I = Intermediate; R = Resistant **                    P = Positive; N = Negative             MICS are expressed in micrograms per mL    Antibiotic  RSLT#1    RSLT#2    RSLT#3    RSLT#4 Amoxicillin/Clavulanic Acid    S Ampicillin                     S Cefepime                       S Ceftriaxone                    S Cefuroxime                     S Ciprofloxacin                  S Ertapenem                      S Gentamicin                     S Imipenem                       S Levofloxacin                   S Meropenem                      S Nitrofurantoin                 S Piperacillin/Tazobactam        S Tetracycline                   S Tobramycin                     S Trimethoprim/Sulfa             S   Urinalysis     Status: Abnormal   Collection Time: 04/23/23  3:23 PM  Result Value Ref Range   Specific Gravity, UA 1.016 1.005 - 1.030   pH, UA 5.5 5.0 - 7.5   Color, UA Yellow Yellow   Appearance Ur Turbid (A) Clear   Leukocytes,UA 3+ (A) Negative   Protein,UA 1+ (A) Negative/Trace   Glucose, UA Negative Negative   Ketones, UA Negative Negative   RBC, UA 1+ (A) Negative   Bilirubin, UA Negative Negative   Urobilinogen, Ur 0.2 0.2 - 1.0 mg/dL   Nitrite, UA Negative Negative  CMP14+EGFR     Status: Abnormal   Collection Time: 05/14/23  1:05 PM  Result Value Ref Range   Glucose 90 70 - 99 mg/dL   BUN 11 8 - 27 mg/dL   Creatinine, Ser 2.13 (H) 0.57 - 1.00 mg/dL   eGFR 41 (L) >08 MV/HQI/6.96   BUN/Creatinine Ratio 8 (L) 12 -  28   Sodium 138 134 - 144 mmol/L   Potassium 4.3 3.5 - 5.2 mmol/L   Chloride 102 96 - 106 mmol/L   CO2 23 20 - 29 mmol/L   Calcium 9.6 8.7 - 10.3 mg/dL   Total Protein 6.1 6.0 - 8.5 g/dL   Albumin 4.3 3.8 - 4.8 g/dL   Globulin, Total 1.8 1.5 - 4.5 g/dL   Bilirubin Total 0.4 0.0 - 1.2 mg/dL   Alkaline Phosphatase 83 44 - 121 IU/L   AST 14 0 - 40 IU/L   ALT 16 0 - 32 IU/L  Lipid Panel w/o Chol/HDL Ratio  Status: Abnormal   Collection Time: 05/14/23  1:05 PM  Result Value Ref Range   Cholesterol, Total 107 100 - 199 mg/dL   Triglycerides 161 0 - 149 mg/dL   HDL 37 (L) >09 mg/dL   VLDL Cholesterol Cal 19 5 - 40 mg/dL   LDL Chol Calc (NIH) 51 0 - 99 mg/dL  Hemoglobin U0A     Status: Abnormal   Collection Time: 05/14/23  1:05 PM  Result Value Ref Range   Hgb A1c MFr Bld 5.8 (H) 4.8 - 5.6 %    Comment:          Prediabetes: 5.7 - 6.4          Diabetes: >6.4          Glycemic control for adults with diabetes: <7.0    Est. average glucose Bld gHb Est-mCnc 120 mg/dL  Lactate dehydrogenase     Status: None   Collection Time: 06/11/23  1:03 PM  Result Value Ref Range   LDH 130 98 - 192 U/L    Comment: Performed at [facility], [address] [place], [place] [address]  CBC with Differential (Cancer Center Only)     Status: Abnormal   Collection Time: 06/11/23  1:03 PM  Result Value Ref Range   WBC Count 55.4 (HH) 4.0 - 10.5 K/uL    Comment: REPEATED TO VERIFY THIS CRITICAL RESULT HAS VERIFIED AND BEEN CALLED TO [facility]ANTOS,E BY CHASE ISLEY ON 11 18 2024 AT 1327, AND HAS BEEN READ BACK.     RBC 3.57 (L) 3.87 - 5.11 MIL/uL   Hemoglobin 10.9 (L) 12.0 - 15.0 g/dL   HCT 11.9 (L) 14.7 - 82.9 %   MCV 96.4 80.0 - 100.0 fL   MCH 30.5 26.0 - 34.0 pg   MCHC 31.7 30.0 - 36.0 g/dL   RDW 56.2 13.0 - 86.5 %   Platelet Count 214 150 - 400 K/uL   nRBC 0.0 0.0 - 0.2 %   Neutrophils Relative % 8 %   Neutro Abs 4.3 1.7 - 7.7 K/uL   Lymphocytes Relative 85 %   Lymphs Abs 47.3 (H) 0.7 -  4.0 K/uL   Monocytes Relative 6 %   Monocytes Absolute 3.2 (H) 0.1 - 1.0 K/uL   Eosinophils Relative 1 %   Eosinophils Absolute 0.3 0.0 - 0.5 K/uL   Basophils Relative 0 %   Basophils Absolute 0.2 (H) 0.0 - 0.1 K/uL   WBC Morphology      Lymphocytosis with morphology consistent with known diagnosis of CLL.    Comment: SMUDGE CELLS DIFF. CONFIRMED BY SMEAR    RBC Morphology MORPHOLOGY UNREMARKABLE    Smear Review Normal platelet morphology     Comment: PLATELETS APPEAR ADEQUATE   Immature Granulocytes 0 %   Abs Immature Granulocytes 0.15 (H) 0.00 - 0.07 K/uL    Comment: Performed at [facility], [address] [place], [place] [address]  CMP (Cancer Center only)     Status: Abnormal   Collection Time: 06/11/23  1:04 PM  Result Value Ref Range   Sodium 137 135 - 145 mmol/L   Potassium 3.4 (L) 3.5 - 5.1 mmol/L   Chloride 100 98 - 111 mmol/L   CO2 28 22 - 32 mmol/L   Glucose, Bld 111 (H) 70 - 99 mg/dL    Comment: Glucose reference range applies only to samples taken after fasting for at least 8 hours.   BUN 10 8 - 23 mg/dL  Creatinine 0.98 0.44 - 1.00 mg/dL   Calcium 9.4 8.9 - 09.8 mg/dL   Total Protein 6.6 6.5 - 8.1 g/dL   Albumin 3.9 3.5 - 5.0 g/dL   AST 13 (L) 15 - 41 U/L   ALT 14 0 - 44 U/L   Alkaline Phosphatase 83 38 - 126 U/L   Total Bilirubin 0.3 <1.2 mg/dL   GFR, Estimated 59 (L) >60 mL/min    Comment: (NOTE) Calculated using the CKD-EPI Creatinine Equation (2021)    Anion gap 9 5 - 15    Comment: Performed at [facility], [address] [place], [place] [address]      Assessment & Plan:  Orthopedic referral for right knee pain and instability.  Patient advised to use a knee brace.  Can continue taking Tylenol. Problem List Items Addressed This Visit     Tobacco abuse   Essential hypertension, benign - Primary   Mixed hyperlipidemia   GAD (generalized anxiety disorder)   Prediabetes   Other Visit Diagnoses       Right knee pain,  unspecified chronicity       Relevant Orders   Ambulatory referral to Orthopedic Surgery       Return in about 4 weeks (around 08/07/2023).   Total time spent: 30 minutes  Margaretann Loveless, MD  07/10/2023   This document may have been prepared by [facility] Voice Recognition software and as such may include unintentional dictation errors.

## 2023-07-11 ENCOUNTER — Other Ambulatory Visit: Payer: 59

## 2023-07-13 ENCOUNTER — Other Ambulatory Visit: Payer: Self-pay | Admitting: Internal Medicine

## 2023-07-13 ENCOUNTER — Other Ambulatory Visit: Payer: Self-pay | Admitting: Family

## 2023-08-02 ENCOUNTER — Telehealth: Payer: Self-pay | Admitting: *Deleted

## 2023-08-02 NOTE — Telephone Encounter (Signed)
 Per verbal order Dr Cathie Hoops, patient may continue to take her Brukinsa and may also use the topical Efudex cream. I called patient and relayed this message to her. She thanked me for letting her know

## 2023-08-02 NOTE — Telephone Encounter (Signed)
 Patient called reporting that she has skin cancer and her Dermatologist has ordered Fluorouracil/ Calcipotriene cream, Asberry Calix, PA with [facility] ordered it. She is asking if she can use this medicine, Also she is asking if she can take her chemotherapy pill if she is using this cream. Please advise. I have left a message with [facility] to fax a copy of her path report to us 

## 2023-08-02 NOTE — Telephone Encounter (Signed)
 [facility] called back she has right cheek several areas of squamous cell carcinoma in situ .   Can she still take her Brukinsa too?

## 2023-08-07 ENCOUNTER — Ambulatory Visit (INDEPENDENT_AMBULATORY_CARE_PROVIDER_SITE_OTHER): Payer: 59 | Admitting: Internal Medicine

## 2023-08-07 ENCOUNTER — Encounter: Payer: Self-pay | Admitting: Internal Medicine

## 2023-08-07 ENCOUNTER — Other Ambulatory Visit: Payer: 59

## 2023-08-07 VITALS — BP 160/90 | HR 60 | Ht 62.0 in | Wt 150.2 lb

## 2023-08-07 DIAGNOSIS — J449 Chronic obstructive pulmonary disease, unspecified: Secondary | ICD-10-CM | POA: Diagnosis not present

## 2023-08-07 DIAGNOSIS — R300 Dysuria: Secondary | ICD-10-CM

## 2023-08-07 DIAGNOSIS — N3946 Mixed incontinence: Secondary | ICD-10-CM | POA: Diagnosis not present

## 2023-08-07 DIAGNOSIS — I10 Essential (primary) hypertension: Secondary | ICD-10-CM | POA: Diagnosis not present

## 2023-08-07 DIAGNOSIS — J208 Acute bronchitis due to other specified organisms: Secondary | ICD-10-CM | POA: Diagnosis not present

## 2023-08-07 DIAGNOSIS — Z720 Tobacco use: Secondary | ICD-10-CM | POA: Diagnosis not present

## 2023-08-07 LAB — POCT URINALYSIS DIPSTICK
Bilirubin, UA: NEGATIVE
Glucose, UA: NEGATIVE
Ketones, UA: NEGATIVE
Leukocytes, UA: NEGATIVE
Nitrite, UA: POSITIVE
Protein, UA: NEGATIVE
Spec Grav, UA: 1.01 (ref 1.010–1.025)
Urobilinogen, UA: 0.2 U/dL
pH, UA: 7 (ref 5.0–8.0)

## 2023-08-07 MED ORDER — OXYBUTYNIN CHLORIDE ER 10 MG PO TB24: 10.0000 mg | ORAL_TABLET | Freq: Every day | ORAL | 3 refills | Status: DC

## 2023-08-07 MED ORDER — METHYLPREDNISOLONE 4 MG PO TBPK: ORAL_TABLET | ORAL | 0 refills | Status: DC

## 2023-08-07 MED ORDER — AMOXICILLIN-POT CLAVULANATE 875-125 MG PO TABS: 1.0000 | ORAL_TABLET | Freq: Two times a day (BID) | ORAL | 0 refills | Status: DC

## 2023-08-07 MED ORDER — PREMARIN 0.625 MG/GM VA CREA: TOPICAL_CREAM | VAGINAL | 3 refills | Status: DC

## 2023-08-07 NOTE — Progress Notes (Signed)
 Established Patient Office Visit  Subjective:  Patient ID: Karen [name], female    DOB: [birth year]  Age: 78 y.o. MRN: [id number]  Chief Complaint  Patient presents with   Follow-up    4 week follow up    Patient comes in with complaints of chest congestion, wheezing, cough with production of greenish sputum.  She has been using her inhalers but ran out of Breztri sample.  Denies fevers or chills, no bodyaches.  She continues to smoke heavily. Also complains of frequent urination, her urine dipstick is clear.  But she admits that she is not taking oxybutynin  regularly.  Patient advised to take it every day and also to use her Premarin  vaginal cream 3 times a week. Will send in a prescription for Augmentin  and Medrol  Dosepak.  Samples of Breztri inhaler given. Will check labs at next visit. Monitor blood pressure.    No other concerns at this time.   Past Medical History:  Diagnosis Date   Anxiety    Artery occlusion    Asthma    Back pain    Bronchitis    Cancer (HCC)    Skin Cancer   CLL (chronic lymphocytic leukemia) (HCC)    Coronary artery disease    Depression    Dyspnea    GERD (gastroesophageal reflux disease)    Herpes    History of kidney stones    Hypercholesteremia    Hyperlipemia    Hypertension    Hypothyroidism    Myocardial infarction (HCC) 1999   Neuropathy    Restless leg syndrome    Sleep apnea    use C-PAP   Wears dentures    full upper, partial lower.  doesn't currently wear    Past Surgical History:  Procedure Laterality Date   COLONOSCOPY WITH PROPOFOL  N/A 11/20/2016   Procedure: COLONOSCOPY WITH PROPOFOL ;  Surgeon: Rogelia Copping, MD;  Location: [facility];  Service: Endoscopy;  Laterality: N/A;  sleep apnea   CORONARY ANGIOPLASTY WITH STENT PLACEMENT  2008   DILATION AND CURETTAGE OF UTERUS     ESOPHAGOGASTRODUODENOSCOPY (EGD) WITH PROPOFOL  N/A 03/09/2022   Procedure: ESOPHAGOGASTRODUODENOSCOPY (EGD) WITH PROPOFOL ;  Surgeon:  Copping Rogelia, MD;  Location: [facility];  Service: Endoscopy;  Laterality: N/A;   EYE SURGERY Bilateral    Cataract Extraction with IOL   FRACTURE SURGERY     HAND SURGERY Left 12/2017   upper hand fracture repair   LEFT HEART CATH AND CORONARY ANGIOGRAPHY N/A 06/29/2017   Procedure: LEFT HEART CATH AND CORONARY ANGIOGRAPHY;  Surgeon: Fernand Denyse LABOR, MD;  Location: [facility];  Service: Cardiovascular;  Laterality: N/A;   LEFT HEART CATH AND CORONARY ANGIOGRAPHY Left 12/02/2018   Procedure: LEFT HEART CATH AND CORONARY ANGIOGRAPHY;  Surgeon: Fernand Denyse LABOR, MD;  Location: [facility];  Service: Cardiovascular;  Laterality: Left;   OPEN REDUCTION INTERNAL FIXATION (ORIF) DISTAL RADIAL FRACTURE Right 08/12/2018   Procedure: OPEN REDUCTION INTERNAL FIXATION (ORIF) DISTAL RADIAL FRACTURE;  Surgeon: Cleotilde Barrio, MD;  Location: [facility];  Service: Orthopedics;  Laterality: Right;   POLYPECTOMY  11/20/2016   Procedure: POLYPECTOMY;  Surgeon: Rogelia Copping, MD;  Location: [facility];  Service: Endoscopy;;   TUBAL LIGATION     VAGINAL HYSTERECTOMY     VULVECTOMY Right 01/31/2016   Procedure: WIDE EXCISION VULVECTOMY-RIGHT LABIA;  Surgeon: Gladis LABOR Dollar, MD;  Location: [facility];  Service: Gynecology;  Laterality: Right;    Social History   Socioeconomic History  Marital status: Single    Spouse name: Not on file   Number of children: 5   Years of education: Not on file   Highest education level: Not on file  Occupational History   Occupation: part time job    Comment: [employer]  Tobacco Use   Smoking status: Every Day    Current packs/day: 1.00    Average packs/day: 1 pack/day for 35.0 years (35.0 ttl pk-yrs)    Types: Cigarettes   Smokeless tobacco: Current    Types: Snuff  Vaping Use   Vaping status: Never Used  Substance and Sexual Activity   Alcohol use: No    Alcohol/week: 0.0 standard drinks of alcohol   Drug use: No   Sexual  activity: Not Currently    Partners: Male    Birth control/protection: Surgical  Other Topics Concern   Not on file  Social History Narrative   Not on file   Social Drivers of Health   Financial Resource Strain: Low Risk  (12/02/2018)   Overall Financial Resource Strain (CARDIA)    Difficulty of Paying Living Expenses: Not very hard  Food Insecurity: No Food Insecurity (05/14/2023)   Hunger Vital Sign    Worried About Running Out of Food in the Last Year: Never true    Ran Out of Food in the Last Year: Never true  Transportation Needs: No Transportation Needs (05/14/2023)   PRAPARE - Administrator, Civil Service (Medical): No    Lack of Transportation (Non-Medical): No  Physical Activity: Inactive (05/14/2023)   Exercise Vital Sign    Days of Exercise per Week: 0 days    Minutes of Exercise per Session: 0 min  Stress: No Stress Concern Present (05/14/2023)   [employer] of Occupational Health - Occupational Stress Questionnaire    Feeling of Stress : Only a little  Social Connections: Unknown (12/02/2018)   Social Connection and Isolation Panel [NHANES]    Frequency of Communication with Friends and Family: More than three times a week    Frequency of Social Gatherings with Friends and Family: Not on file    Attends Religious Services: Not on file    Active Member of Clubs or Organizations: Not on file    Attends Banker Meetings: Not on file    Marital Status: Not on file  Intimate Partner Violence: Not At Risk (05/14/2023)   Humiliation, Afraid, Rape, and Kick questionnaire    Fear of Current or Ex-Partner: No    Emotionally Abused: No    Physically Abused: No    Sexually Abused: No    Family History  Problem Relation Age of Onset   Heart disease Father    Stroke Mother    Hypertension Mother    Lung cancer Brother    Heart attack Brother    Congestive Heart Failure Sister    Cervical cancer Sister    Cancer Neg Hx    Diabetes Neg  Hx    Breast cancer Neg Hx     Allergies  Allergen Reactions   Requip [Ropinirole Hcl] Anaphylaxis    Throat closing   Celebrex [Celecoxib] Itching   Meloxicam Palpitations   Nitrofurantoin Palpitations   Ranexa [Ranolazine] Hives    Outpatient Medications Prior to Visit  Medication Sig   acetaminophen  (TYLENOL ) 500 MG tablet Take 500-1,000 mg by mouth every 6 (six) hours as needed for moderate pain or fever.   albuterol  (VENTOLIN  HFA) 108 (90 Base) MCG/ACT inhaler INHALE 2 PUFFS  BY MOUTH 4 TIMES A DAY AS NEEDED   amLODipine  (NORVASC ) 5 MG tablet TAKE 1 TABLET BY MOUTH EVERY DAY   aspirin  EC 81 MG tablet Take 1 tablet (81 mg total) by mouth daily.   atorvastatin  (LIPITOR) 40 MG tablet TAKE 1 TABLET BY MOUTH EVERY DAY   clopidogrel  (PLAVIX ) 75 MG tablet TAKE 1 TABLET BY MOUTH EVERY DAY   DULoxetine  (CYMBALTA ) 60 MG capsule TAKE 1 CAPSULE BY MOUTH EVERY DAY   isosorbide  mononitrate (IMDUR ) 120 MG 24 hr tablet TAKE 1 TABLET BY MOUTH EVERY DAY   lisinopril  (ZESTRIL ) 20 MG tablet TAKE 1 TABLET BY MOUTH EVERY DAY   meclizine  (ANTIVERT ) 25 MG tablet Take 1 tablet (25 mg total) by mouth 3 (three) times daily as needed for dizziness.   metoprolol  succinate (TOPROL -XL) 50 MG 24 hr tablet TAKE 1 TABLET BY MOUTH EVERY DAY   ondansetron  (ZOFRAN ) 4 MG tablet Take 1 tablet (4 mg total) by mouth every 8 (eight) hours as needed for nausea or vomiting.   pantoprazole  (PROTONIX ) 40 MG tablet TAKE 1 TABLET BY MOUTH TWICE A DAY   traZODone  (DESYREL ) 50 MG tablet TAKE 1 TABLET BY MOUTH EVERYDAY AT BEDTIME   zanubrutinib  (BRUKINSA ) 80 MG capsule Take 2 capsules (160 mg total) by mouth 2 (two) times daily.   [DISCONTINUED] oxybutynin  (DITROPAN -XL) 10 MG 24 hr tablet TAKE 1 TABLET BY MOUTH EVERY DAY   [DISCONTINUED] PREMARIN  vaginal cream USE 1 GRAM INTRAVAGINALLY TWICE PER WEEK   nitroGLYCERIN  (NITROSTAT ) 0.4 MG SL tablet Place 1 tablet (0.4 mg total) under the tongue every 5 (five) minutes as needed for  chest pain. (Patient not taking: Reported on 08/07/2023)   [DISCONTINUED] methylPREDNISolone  (MEDROL  DOSEPAK) 4 MG TBPK tablet Use as directed. (Patient not taking: Reported on 08/07/2023)   No facility-administered medications prior to visit.    Review of Systems  Constitutional:  Positive for malaise/fatigue. Negative for chills, diaphoresis and fever.  HENT:  Positive for congestion, hearing loss and sinus pain. Negative for ear discharge.   Eyes: Negative.   Respiratory:  Positive for cough, sputum production and wheezing. Negative for shortness of breath.   Cardiovascular: Negative.  Negative for chest pain, palpitations and leg swelling.  Gastrointestinal: Negative.  Negative for abdominal pain, constipation, diarrhea, heartburn, nausea and vomiting.  Genitourinary:  Positive for urgency. Negative for dysuria and flank pain.  Musculoskeletal: Negative.  Negative for joint pain and myalgias.  Skin: Negative.   Neurological: Negative.  Negative for dizziness, seizures, loss of consciousness, weakness and headaches.  Endo/Heme/Allergies: Negative.   Psychiatric/Behavioral: Negative.  Negative for depression and suicidal ideas. The patient is not nervous/anxious.        Objective:   BP (!) 160/90   Pulse 60   Ht 5' 2 (1.575 m)   Wt 150 lb 3.2 oz (68.1 kg)   SpO2 97%   BMI 27.47 kg/m   Vitals:   08/07/23 1441  BP: (!) 160/90  Pulse: 60  Height: 5' 2 (1.575 m)  Weight: 150 lb 3.2 oz (68.1 kg)  SpO2: 97%  BMI (Calculated): 27.46    Physical Exam Vitals and nursing note reviewed.  Constitutional:      Appearance: Normal appearance.  HENT:     Head: Normocephalic and atraumatic.     Nose: Nose normal.     Mouth/Throat:     Mouth: Mucous membranes are moist.     Pharynx: Oropharynx is clear.  Eyes:     Conjunctiva/sclera: Conjunctivae normal.  Pupils: Pupils are equal, round, and reactive to light.  Cardiovascular:     Rate and Rhythm: Normal rate and regular  rhythm.     Pulses: Normal pulses.     Heart sounds: Normal heart sounds. No murmur heard. Pulmonary:     Effort: Pulmonary effort is normal.     Breath sounds: Wheezing present. No rhonchi or rales.  Chest:     Chest wall: No tenderness.  Abdominal:     General: Bowel sounds are normal.     Palpations: Abdomen is soft.     Tenderness: There is no abdominal tenderness. There is no right CVA tenderness or left CVA tenderness.  Musculoskeletal:        General: Normal range of motion.     Cervical back: Normal range of motion.     Right lower leg: No edema.     Left lower leg: No edema.  Skin:    General: Skin is warm and dry.  Neurological:     General: No focal deficit present.     Mental Status: She is alert and oriented to person, place, and time.  Psychiatric:        Mood and Affect: Mood normal.        Behavior: Behavior normal.      Results for orders placed or performed in visit on 08/07/23  POCT Urinalysis Dipstick (81002)  Result Value Ref Range   Color, UA yellow    Clarity, UA clear    Glucose, UA Negative Negative   Bilirubin, UA neg    Ketones, UA neg    Spec Grav, UA 1.010 1.010 - 1.025   Blood, UA trace    pH, UA 7.0 5.0 - 8.0   Protein, UA Negative Negative   Urobilinogen, UA 0.2 0.2 or 1.0 E.U./dL   Nitrite, UA pos    Leukocytes, UA Negative Negative   Appearance clear    Odor yes     Recent Results (from the past 2160 hours)  CMP14+EGFR     Status: Abnormal   Collection Time: 05/14/23  1:05 PM  Result Value Ref Range   Glucose 90 70 - 99 mg/dL   BUN 11 8 - 27 mg/dL   Creatinine, Ser 8.67 (H) 0.57 - 1.00 mg/dL   eGFR 41 (L) >40 fO/fpw/8.26   BUN/Creatinine Ratio 8 (L) 12 - 28   Sodium 138 134 - 144 mmol/L   Potassium 4.3 3.5 - 5.2 mmol/L   Chloride 102 96 - 106 mmol/L   CO2 23 20 - 29 mmol/L   Calcium  9.6 8.7 - 10.3 mg/dL   Total Protein 6.1 6.0 - 8.5 g/dL   Albumin 4.3 3.8 - 4.8 g/dL   Globulin, Total 1.8 1.5 - 4.5 g/dL   Bilirubin  Total 0.4 0.0 - 1.2 mg/dL   Alkaline Phosphatase 83 44 - 121 IU/L   AST 14 0 - 40 IU/L   ALT 16 0 - 32 IU/L  Lipid Panel w/o Chol/HDL Ratio     Status: Abnormal   Collection Time: 05/14/23  1:05 PM  Result Value Ref Range   Cholesterol, Total 107 100 - 199 mg/dL   Triglycerides 898 0 - 149 mg/dL   HDL 37 (L) >60 mg/dL   VLDL Cholesterol Cal 19 5 - 40 mg/dL   LDL Chol Calc (NIH) 51 0 - 99 mg/dL  Hemoglobin J8r     Status: Abnormal   Collection Time: 05/14/23  1:05 PM  Result Value Ref Range  Hgb A1c MFr Bld 5.8 (H) 4.8 - 5.6 %    Comment:          Prediabetes: 5.7 - 6.4          Diabetes: >6.4          Glycemic control for adults with diabetes: <7.0    Est. average glucose Bld gHb Est-mCnc 120 mg/dL  Lactate dehydrogenase     Status: None   Collection Time: 06/11/23  1:03 PM  Result Value Ref Range   LDH 130 98 - 192 U/L    Comment: Performed at [facility], [address] [place], [place] [address]  CBC with Differential (Cancer Center Only)     Status: Abnormal   Collection Time: 06/11/23  1:03 PM  Result Value Ref Range   WBC Count 55.4 (HH) 4.0 - 10.5 K/uL    Comment: REPEATED TO VERIFY THIS CRITICAL RESULT HAS VERIFIED AND BEEN CALLED TO SANTOS,E BY CHASE ISLEY ON 11 18 2024 AT 1327, AND HAS BEEN READ BACK.     RBC 3.57 (L) 3.87 - 5.11 MIL/uL   Hemoglobin 10.9 (L) 12.0 - 15.0 g/dL   HCT 65.5 (L) 63.9 - 53.9 %   MCV 96.4 80.0 - 100.0 fL   MCH 30.5 26.0 - 34.0 pg   MCHC 31.7 30.0 - 36.0 g/dL   RDW 86.9 88.4 - 84.4 %   Platelet Count 214 150 - 400 K/uL   nRBC 0.0 0.0 - 0.2 %   Neutrophils Relative % 8 %   Neutro Abs 4.3 1.7 - 7.7 K/uL   Lymphocytes Relative 85 %   Lymphs Abs 47.3 (H) 0.7 - 4.0 K/uL   Monocytes Relative 6 %   Monocytes Absolute 3.2 (H) 0.1 - 1.0 K/uL   Eosinophils Relative 1 %   Eosinophils Absolute 0.3 0.0 - 0.5 K/uL   Basophils Relative 0 %   Basophils Absolute 0.2 (H) 0.0 - 0.1 K/uL   WBC Morphology      Lymphocytosis with morphology  consistent with known diagnosis of CLL.    Comment: SMUDGE CELLS DIFF. CONFIRMED BY SMEAR    RBC Morphology MORPHOLOGY UNREMARKABLE    Smear Review Normal platelet morphology     Comment: PLATELETS APPEAR ADEQUATE   Immature Granulocytes 0 %   Abs Immature Granulocytes 0.15 (H) 0.00 - 0.07 K/uL    Comment: Performed at [facility], [address] [place], [place] [address]  CMP (Cancer Center only)     Status: Abnormal   Collection Time: 06/11/23  1:04 PM  Result Value Ref Range   Sodium 137 135 - 145 mmol/L   Potassium 3.4 (L) 3.5 - 5.1 mmol/L   Chloride 100 98 - 111 mmol/L   CO2 28 22 - 32 mmol/L   Glucose, Bld 111 (H) 70 - 99 mg/dL    Comment: Glucose reference range applies only to samples taken after fasting for at least 8 hours.   BUN 10 8 - 23 mg/dL   Creatinine 9.01 9.55 - 1.00 mg/dL   Calcium  9.4 8.9 - 10.3 mg/dL   Total Protein 6.6 6.5 - 8.1 g/dL   Albumin 3.9 3.5 - 5.0 g/dL   AST 13 (L) 15 - 41 U/L   ALT 14 0 - 44 U/L   Alkaline Phosphatase 83 38 - 126 U/L   Total Bilirubin 0.3 <1.2 mg/dL   GFR, Estimated 59 (L) >60 mL/min    Comment: (NOTE) Calculated using the CKD-EPI Creatinine Equation (2021)  Anion gap 9 5 - 15    Comment: Performed at [facility], [address] [place], [place] [address]  POCT Urinalysis Dipstick [phone])     Status: None   Collection Time: 08/07/23  3:10 PM  Result Value Ref Range   Color, UA yellow    Clarity, UA clear    Glucose, UA Negative Negative   Bilirubin, UA neg    Ketones, UA neg    Spec Grav, UA 1.010 1.010 - 1.025   Blood, UA trace    pH, UA 7.0 5.0 - 8.0   Protein, UA Negative Negative   Urobilinogen, UA 0.2 0.2 or 1.0 E.U./dL   Nitrite, UA pos    Leukocytes, UA Negative Negative   Appearance clear    Odor yes       Assessment & Plan:  Prescription sent for Augmentin  and Medrol  Dosepak.  Patient will resume her oxybutynin  and Premarin  vaginal cream for urinary complaints. Problem List Items  Addressed This Visit     Tobacco abuse   Essential hypertension, benign - Primary   Mixed stress and urge urinary incontinence   Relevant Medications   oxybutynin  (DITROPAN -XL) 10 MG 24 hr tablet   conjugated estrogens  (PREMARIN ) vaginal cream   Chronic obstructive pulmonary disease (HCC)   Relevant Medications   amoxicillin -clavulanate (AUGMENTIN ) 875-125 MG tablet   methylPREDNISolone  (MEDROL  DOSEPAK) 4 MG TBPK tablet   Other Visit Diagnoses       Dysuria       Relevant Orders   POCT Urinalysis Dipstick (18997) (Completed)     Acute bronchitis due to other specified organisms       Relevant Medications   amoxicillin -clavulanate (AUGMENTIN ) 875-125 MG tablet       Return in about 10 days (around 08/17/2023).   Total time spent: 30 minutes  FERNAND FREDY RAMAN, MD  08/07/2023   This document may have been prepared by [facility] Voice Recognition software and as such may include unintentional dictation errors.

## 2023-08-08 ENCOUNTER — Other Ambulatory Visit: Payer: 59

## 2023-08-09 DIAGNOSIS — D485 Neoplasm of uncertain behavior of skin: Secondary | ICD-10-CM | POA: Diagnosis not present

## 2023-08-09 DIAGNOSIS — D0439 Carcinoma in situ of skin of other parts of face: Secondary | ICD-10-CM | POA: Diagnosis not present

## 2023-08-09 DIAGNOSIS — C44329 Squamous cell carcinoma of skin of other parts of face: Secondary | ICD-10-CM | POA: Diagnosis not present

## 2023-08-09 DIAGNOSIS — L570 Actinic keratosis: Secondary | ICD-10-CM | POA: Diagnosis not present

## 2023-08-13 ENCOUNTER — Inpatient Hospital Stay: Payer: 59 | Admitting: Oncology

## 2023-08-13 ENCOUNTER — Inpatient Hospital Stay: Payer: 59

## 2023-08-15 ENCOUNTER — Other Ambulatory Visit: Payer: Medicaid Other

## 2023-08-17 ENCOUNTER — Ambulatory Visit: Payer: 59 | Admitting: Internal Medicine

## 2023-08-22 ENCOUNTER — Ambulatory Visit (INDEPENDENT_AMBULATORY_CARE_PROVIDER_SITE_OTHER): Payer: 59

## 2023-08-22 DIAGNOSIS — M8589 Other specified disorders of bone density and structure, multiple sites: Secondary | ICD-10-CM

## 2023-08-22 DIAGNOSIS — Z13820 Encounter for screening for osteoporosis: Secondary | ICD-10-CM | POA: Diagnosis not present

## 2023-08-24 ENCOUNTER — Ambulatory Visit (INDEPENDENT_AMBULATORY_CARE_PROVIDER_SITE_OTHER): Payer: 59 | Admitting: Internal Medicine

## 2023-08-24 ENCOUNTER — Encounter: Payer: Self-pay | Admitting: Internal Medicine

## 2023-08-24 VITALS — BP 150/90 | HR 79 | Ht 62.0 in | Wt 149.0 lb

## 2023-08-24 DIAGNOSIS — H8110 Benign paroxysmal vertigo, unspecified ear: Secondary | ICD-10-CM

## 2023-08-24 DIAGNOSIS — I10 Essential (primary) hypertension: Secondary | ICD-10-CM | POA: Diagnosis not present

## 2023-08-24 DIAGNOSIS — C9110 Chronic lymphocytic leukemia of B-cell type not having achieved remission: Secondary | ICD-10-CM | POA: Diagnosis not present

## 2023-08-24 DIAGNOSIS — M810 Age-related osteoporosis without current pathological fracture: Secondary | ICD-10-CM | POA: Diagnosis not present

## 2023-08-24 DIAGNOSIS — R6889 Other general symptoms and signs: Secondary | ICD-10-CM

## 2023-08-24 LAB — POC INFLUENZA A&B (BINAX/QUICKVUE)
Influenza A, POC: NEGATIVE
Influenza B, POC: NEGATIVE

## 2023-08-24 MED ORDER — MECLIZINE HCL 25 MG PO TABS: 25.0000 mg | ORAL_TABLET | Freq: Three times a day (TID) | ORAL | 2 refills | Status: AC | PRN

## 2023-08-24 MED ORDER — DENOSUMAB 60 MG/ML ~~LOC~~ SOSY: 60.0000 mg | PREFILLED_SYRINGE | Freq: Once | SUBCUTANEOUS | 0 refills | Status: AC

## 2023-08-24 NOTE — Progress Notes (Signed)
Established Patient Office Visit  Subjective:  Patient ID: Karen [name], female    DOB: 06-05-1946  Age: 78 y.o. MRN: [id number]  Chief Complaint  Patient presents with   Follow-up    10 day follow up    Patient comes in for her follow-up.  Her blood pressure is still high but better than before.  However she is having history of vertigo, but ran out of her meclizine.  Complains of pressure in her ears as well as some runny nose.  Her flu test in the office is negative.  Patient advised to rest, fluids and take her meclizine.  Will continue to monitor blood pressure. Her DEXA scan shows osteoporosis, she used to be on Fosamax and calcium but stopped both. Will try sending in a prescription for Prolia if approved will start Prolia injections every 6 months, and also to continue Caltrate D twice a day.    No other concerns at this time.   Past Medical History:  Diagnosis Date   Anxiety    Artery occlusion    Asthma    Back pain    Bronchitis    Cancer (HCC)    Skin Cancer   CLL (chronic lymphocytic leukemia) (HCC)    Coronary artery disease    Depression    Dyspnea    GERD (gastroesophageal reflux disease)    Herpes    History of kidney stones    Hypercholesteremia    Hyperlipemia    Hypertension    Hypothyroidism    Myocardial infarction (HCC) 1999   Neuropathy    Restless leg syndrome    Sleep apnea    use C-PAP   Wears dentures    full upper, partial lower.  doesn't currently wear    Past Surgical History:  Procedure Laterality Date   COLONOSCOPY WITH PROPOFOL N/A 11/20/2016   Procedure: COLONOSCOPY WITH PROPOFOL;  Surgeon: Midge Minium, MD;  Location: [facility];  Service: Endoscopy;  Laterality: N/A;  sleep apnea   CORONARY ANGIOPLASTY WITH STENT PLACEMENT  2008   DILATION AND CURETTAGE OF UTERUS     ESOPHAGOGASTRODUODENOSCOPY (EGD) WITH PROPOFOL N/A 03/09/2022   Procedure: ESOPHAGOGASTRODUODENOSCOPY (EGD) WITH PROPOFOL;  Surgeon: Midge Minium, MD;   Location: [facility];  Service: Endoscopy;  Laterality: N/A;   EYE SURGERY Bilateral    Cataract Extraction with IOL   FRACTURE SURGERY     HAND SURGERY Left 12/2017   upper hand fracture repair   LEFT HEART CATH AND CORONARY ANGIOGRAPHY N/A 06/29/2017   Procedure: LEFT HEART CATH AND CORONARY ANGIOGRAPHY;  Surgeon: Laurier Nancy, MD;  Location: [facility];  Service: Cardiovascular;  Laterality: N/A;   LEFT HEART CATH AND CORONARY ANGIOGRAPHY Left 12/02/2018   Procedure: LEFT HEART CATH AND CORONARY ANGIOGRAPHY;  Surgeon: Laurier Nancy, MD;  Location: [facility];  Service: Cardiovascular;  Laterality: Left;   OPEN REDUCTION INTERNAL FIXATION (ORIF) DISTAL RADIAL FRACTURE Right 08/12/2018   Procedure: OPEN REDUCTION INTERNAL FIXATION (ORIF) DISTAL RADIAL FRACTURE;  Surgeon: Deeann Saint, MD;  Location: [facility];  Service: Orthopedics;  Laterality: Right;   POLYPECTOMY  11/20/2016   Procedure: POLYPECTOMY;  Surgeon: Midge Minium, MD;  Location: [facility];  Service: Endoscopy;;   TUBAL LIGATION     VAGINAL HYSTERECTOMY     VULVECTOMY Right 01/31/2016   Procedure: WIDE EXCISION VULVECTOMY-RIGHT LABIA;  Surgeon: Herold Harms, MD;  Location: [facility];  Service: Gynecology;  Laterality: Right;    Social History  Socioeconomic History   Marital status: Single    Spouse name: Not on file   Number of children: 5   Years of education: Not on file   Highest education level: Not on file  Occupational History   Occupation: part time job    Comment: [employer]  Tobacco Use   Smoking status: Every Day    Current packs/day: 1.00    Average packs/day: 1 pack/day for 35.0 years (35.0 ttl pk-yrs)    Types: Cigarettes   Smokeless tobacco: Current    Types: Snuff  Vaping Use   Vaping status: Never Used  Substance and Sexual Activity   Alcohol use: No    Alcohol/week: 0.0 standard drinks of alcohol   Drug use: No   Sexual activity: Not Currently     Partners: Male    Birth control/protection: Surgical  Other Topics Concern   Not on file  Social History Narrative   Not on file   Social Drivers of Health   Financial Resource Strain: Low Risk  (12/02/2018)   Overall Financial Resource Strain (CARDIA)    Difficulty of Paying Living Expenses: Not very hard  Food Insecurity: No Food Insecurity (05/14/2023)   Hunger Vital Sign    Worried About Running Out of Food in the Last Year: Never true    Ran Out of Food in the Last Year: Never true  Transportation Needs: No Transportation Needs (05/14/2023)   PRAPARE - Administrator, Civil Service (Medical): No    Lack of Transportation (Non-Medical): No  Physical Activity: Inactive (05/14/2023)   Exercise Vital Sign    Days of Exercise per Week: 0 days    Minutes of Exercise per Session: 0 min  Stress: No Stress Concern Present (05/14/2023)   [employer] of Occupational Health - Occupational Stress Questionnaire    Feeling of Stress : Only a little  Social Connections: Unknown (12/02/2018)   Social Connection and Isolation Panel [NHANES]    Frequency of Communication with Friends and Family: More than three times a week    Frequency of Social Gatherings with Friends and Family: Not on file    Attends Religious Services: Not on file    Active Member of Clubs or Organizations: Not on file    Attends Banker Meetings: Not on file    Marital Status: Not on file  Intimate Partner Violence: Not At Risk (05/14/2023)   Humiliation, Afraid, Rape, and Kick questionnaire    Fear of Current or Ex-Partner: No    Emotionally Abused: No    Physically Abused: No    Sexually Abused: No    Family History  Problem Relation Age of Onset   Heart disease Father    Stroke Mother    Hypertension Mother    Lung cancer Brother    Heart attack Brother    Congestive Heart Failure Sister    Cervical cancer Sister    Cancer Neg Hx    Diabetes Neg Hx    Breast cancer Neg  Hx     Allergies  Allergen Reactions   Requip [Ropinirole Hcl] Anaphylaxis    Throat closing   Celebrex [Celecoxib] Itching   Meloxicam Palpitations   Nitrofurantoin Palpitations   Ranexa [Ranolazine] Hives    Outpatient Medications Prior to Visit  Medication Sig   acetaminophen (TYLENOL) 500 MG tablet Take 500-1,000 mg by mouth every 6 (six) hours as needed for moderate pain or fever.   albuterol (VENTOLIN HFA) 108 (90 Base) MCG/ACT  inhaler INHALE 2 PUFFS BY MOUTH 4 TIMES A DAY AS NEEDED   amLODipine (NORVASC) 5 MG tablet TAKE 1 TABLET BY MOUTH EVERY DAY   aspirin EC 81 MG tablet Take 1 tablet (81 mg total) by mouth daily.   atorvastatin (LIPITOR) 40 MG tablet TAKE 1 TABLET BY MOUTH EVERY DAY   clopidogrel (PLAVIX) 75 MG tablet TAKE 1 TABLET BY MOUTH EVERY DAY   conjugated estrogens (PREMARIN) vaginal cream USE 1 GRAM INTRAVAGINALLY TWICE PER WEEK   DULoxetine (CYMBALTA) 60 MG capsule TAKE 1 CAPSULE BY MOUTH EVERY DAY   isosorbide mononitrate (IMDUR) 120 MG 24 hr tablet TAKE 1 TABLET BY MOUTH EVERY DAY   lisinopril (ZESTRIL) 20 MG tablet TAKE 1 TABLET BY MOUTH EVERY DAY   metoprolol succinate (TOPROL-XL) 50 MG 24 hr tablet TAKE 1 TABLET BY MOUTH EVERY DAY   nitroGLYCERIN (NITROSTAT) 0.4 MG SL tablet Place 1 tablet (0.4 mg total) under the tongue every 5 (five) minutes as needed for chest pain.   ondansetron (ZOFRAN) 4 MG tablet Take 1 tablet (4 mg total) by mouth every 8 (eight) hours as needed for nausea or vomiting.   oxybutynin (DITROPAN-XL) 10 MG 24 hr tablet Take 1 tablet (10 mg total) by mouth daily.   pantoprazole (PROTONIX) 40 MG tablet TAKE 1 TABLET BY MOUTH TWICE A DAY   traZODone (DESYREL) 50 MG tablet TAKE 1 TABLET BY MOUTH EVERYDAY AT BEDTIME   zanubrutinib (BRUKINSA) 80 MG capsule Take 2 capsules (160 mg total) by mouth 2 (two) times daily.   [DISCONTINUED] meclizine (ANTIVERT) 25 MG tablet Take 1 tablet (25 mg total) by mouth 3 (three) times daily as needed for  dizziness.   amoxicillin-clavulanate (AUGMENTIN) 875-125 MG tablet Take 1 tablet by mouth 2 (two) times daily. (Patient not taking: Reported on 08/24/2023)   methylPREDNISolone (MEDROL DOSEPAK) 4 MG TBPK tablet Use as directed (Patient not taking: Reported on 08/24/2023)   No facility-administered medications prior to visit.    Review of Systems  Constitutional:  Positive for chills and malaise/fatigue. Negative for diaphoresis, fever and weight loss.  HENT:  Positive for congestion and ear pain. Negative for sinus pain and sore throat.   Eyes: Negative.   Respiratory: Negative.  Negative for cough, shortness of breath and stridor.   Cardiovascular: Negative.  Negative for chest pain, palpitations and leg swelling.  Gastrointestinal: Negative.  Negative for abdominal pain, constipation, diarrhea, heartburn, nausea and vomiting.  Genitourinary: Negative.  Negative for dysuria and flank pain.  Musculoskeletal: Negative.  Negative for joint pain and myalgias.  Skin: Negative.   Neurological: Negative.  Negative for dizziness and headaches.  Endo/Heme/Allergies: Negative.   Psychiatric/Behavioral: Negative.  Negative for depression and suicidal ideas. The patient is not nervous/anxious.        Objective:   BP (!) 150/90   Pulse 79   Ht 5\' 2"  (1.575 m)   Wt 149 lb (67.6 kg)   SpO2 99%   BMI 27.25 kg/m   Vitals:   08/24/23 0950  BP: (!) 150/90  Pulse: 79  Height: 5\' 2"  (1.575 m)  Weight: 149 lb (67.6 kg)  SpO2: 99%  BMI (Calculated): 27.25    Physical Exam Vitals and nursing note reviewed.  Constitutional:      Appearance: Normal appearance.  HENT:     Head: Normocephalic and atraumatic.     Nose: Nose normal.     Mouth/Throat:     Mouth: Mucous membranes are moist.     Pharynx: Oropharynx  is clear.  Eyes:     Conjunctiva/sclera: Conjunctivae normal.     Pupils: Pupils are equal, round, and reactive to light.  Cardiovascular:     Rate and Rhythm: Normal rate and  regular rhythm.     Pulses: Normal pulses.     Heart sounds: Normal heart sounds. No murmur heard. Pulmonary:     Effort: Pulmonary effort is normal.     Breath sounds: Normal breath sounds. No wheezing.  Abdominal:     General: Bowel sounds are normal.     Palpations: Abdomen is soft.     Tenderness: There is no abdominal tenderness. There is no right CVA tenderness or left CVA tenderness.  Musculoskeletal:        General: Normal range of motion.     Cervical back: Normal range of motion.     Right lower leg: No edema.     Left lower leg: No edema.  Skin:    General: Skin is warm and dry.  Neurological:     General: No focal deficit present.     Mental Status: She is alert and oriented to person, place, and time.  Psychiatric:        Mood and Affect: Mood normal.        Behavior: Behavior normal.      Results for orders placed or performed in visit on 08/24/23  POC Influenza A&B (Binax test)  Result Value Ref Range   Influenza A, POC Negative Negative   Influenza B, POC Negative Negative    Recent Results (from the past 2160 hours)  Lactate dehydrogenase     Status: None   Collection Time: 06/11/23  1:03 PM  Result Value Ref Range   LDH 130 98 - 192 U/L    Comment: Performed at [facility], [address] [place], [place] [address]  CBC with Differential (Cancer Center Only)     Status: Abnormal   Collection Time: 06/11/23  1:03 PM  Result Value Ref Range   WBC Count 55.4 (HH) 4.0 - 10.5 K/uL    Comment: REPEATED TO VERIFY THIS CRITICAL RESULT HAS VERIFIED AND BEEN CALLED TO SANTOS,E BY CHASE ISLEY ON 11 18 2024 AT 1327, AND HAS BEEN READ BACK.     RBC 3.57 (L) 3.87 - 5.11 MIL/uL   Hemoglobin 10.9 (L) 12.0 - 15.0 g/dL   HCT 98.1 (L) 19.1 - 47.8 %   MCV 96.4 80.0 - 100.0 fL   MCH 30.5 26.0 - 34.0 pg   MCHC 31.7 30.0 - 36.0 g/dL   RDW 29.5 62.1 - 30.8 %   Platelet Count 214 150 - 400 K/uL   nRBC 0.0 0.0 - 0.2 %   Neutrophils Relative % 8 %   Neutro Abs  4.3 1.7 - 7.7 K/uL   Lymphocytes Relative 85 %   Lymphs Abs 47.3 (H) 0.7 - 4.0 K/uL   Monocytes Relative 6 %   Monocytes Absolute 3.2 (H) 0.1 - 1.0 K/uL   Eosinophils Relative 1 %   Eosinophils Absolute 0.3 0.0 - 0.5 K/uL   Basophils Relative 0 %   Basophils Absolute 0.2 (H) 0.0 - 0.1 K/uL   WBC Morphology      Lymphocytosis with morphology consistent with known diagnosis of CLL.    Comment: SMUDGE CELLS DIFF. CONFIRMED BY SMEAR    RBC Morphology MORPHOLOGY UNREMARKABLE    Smear Review Normal platelet morphology     Comment: PLATELETS APPEAR ADEQUATE   Immature Granulocytes 0 %  Abs Immature Granulocytes 0.15 (H) 0.00 - 0.07 K/uL    Comment: Performed at [facility], [address] [place], [place] [address]  CMP (Cancer Center only)     Status: Abnormal   Collection Time: 06/11/23  1:04 PM  Result Value Ref Range   Sodium 137 135 - 145 mmol/L   Potassium 3.4 (L) 3.5 - 5.1 mmol/L   Chloride 100 98 - 111 mmol/L   CO2 28 22 - 32 mmol/L   Glucose, Bld 111 (H) 70 - 99 mg/dL    Comment: Glucose reference range applies only to samples taken after fasting for at least 8 hours.   BUN 10 8 - 23 mg/dL   Creatinine 6.21 3.08 - 1.00 mg/dL   Calcium 9.4 8.9 - 65.7 mg/dL   Total Protein 6.6 6.5 - 8.1 g/dL   Albumin 3.9 3.5 - 5.0 g/dL   AST 13 (L) 15 - 41 U/L   ALT 14 0 - 44 U/L   Alkaline Phosphatase 83 38 - 126 U/L   Total Bilirubin 0.3 <1.2 mg/dL   GFR, Estimated 59 (L) >60 mL/min    Comment: (NOTE) Calculated using the CKD-EPI Creatinine Equation (2021)    Anion gap 9 5 - 15    Comment: Performed at [facility], [address] [place], [place] [address]  POCT Urinalysis Dipstick [phone])     Status: None   Collection Time: 08/07/23  3:10 PM  Result Value Ref Range   Color, UA yellow    Clarity, UA clear    Glucose, UA Negative Negative   Bilirubin, UA neg    Ketones, UA neg    Spec Grav, UA 1.010 1.010 - 1.025   Blood, UA trace    pH, UA 7.0 5.0 - 8.0    Protein, UA Negative Negative   Urobilinogen, UA 0.2 0.2 or 1.0 E.U./dL   Nitrite, UA pos    Leukocytes, UA Negative Negative   Appearance clear    Odor yes   POC Influenza A&B (Binax test)     Status: None   Collection Time: 08/24/23 10:44 AM  Result Value Ref Range   Influenza A, POC Negative Negative   Influenza B, POC Negative Negative      Assessment & Plan:  Patient to resume meclizine.  Continue rest and fluids at home.  Monitor blood pressure. Will start Prolia injections if approved for osteoporosis. Problem List Items Addressed This Visit     CLL (chronic lymphocytic leukemia) (HCC) (Chronic)   Relevant Medications   meclizine (ANTIVERT) 25 MG tablet   Essential hypertension, benign - Primary   Benign paroxysmal positional vertigo   Relevant Medications   meclizine (ANTIVERT) 25 MG tablet   Age-related osteoporosis without current pathological fracture   Relevant Medications   denosumab (PROLIA) 60 MG/ML SOSY injection   Other Visit Diagnoses       Flu-like symptoms       Relevant Orders   POC Influenza A&B (Binax test) (Completed)       Return in about 3 weeks (around 09/14/2023).   Total time spent: 30 minutes  Margaretann Loveless, MD  08/24/2023   This document may have been prepared by [facility] Voice Recognition software and as such may include unintentional dictation errors.

## 2023-08-24 NOTE — Progress Notes (Signed)
 Patient notified

## 2023-08-27 ENCOUNTER — Inpatient Hospital Stay: Payer: 59

## 2023-08-27 ENCOUNTER — Inpatient Hospital Stay: Payer: 59 | Admitting: Oncology

## 2023-09-12 ENCOUNTER — Other Ambulatory Visit: Payer: 59

## 2023-09-12 ENCOUNTER — Ambulatory Visit: Payer: 59 | Admitting: Oncology

## 2023-09-17 ENCOUNTER — Ambulatory Visit: Payer: 59 | Admitting: Oncology

## 2023-09-17 ENCOUNTER — Other Ambulatory Visit: Payer: 59

## 2023-09-18 ENCOUNTER — Ambulatory Visit: Payer: 59 | Admitting: Cardiovascular Disease

## 2023-09-18 DIAGNOSIS — R051 Acute cough: Secondary | ICD-10-CM | POA: Diagnosis not present

## 2023-09-18 DIAGNOSIS — R0789 Other chest pain: Secondary | ICD-10-CM | POA: Diagnosis not present

## 2023-09-21 ENCOUNTER — Other Ambulatory Visit: Payer: Self-pay | Admitting: Internal Medicine

## 2023-09-24 ENCOUNTER — Inpatient Hospital Stay: Payer: 59 | Attending: Oncology

## 2023-09-24 ENCOUNTER — Telehealth: Payer: Self-pay

## 2023-09-24 ENCOUNTER — Inpatient Hospital Stay (HOSPITAL_BASED_OUTPATIENT_CLINIC_OR_DEPARTMENT_OTHER): Payer: 59 | Admitting: Oncology

## 2023-09-24 ENCOUNTER — Encounter: Payer: Self-pay | Admitting: Oncology

## 2023-09-24 VITALS — BP 143/85 | HR 60 | Temp 97.4°F | Resp 18 | Wt 149.7 lb

## 2023-09-24 DIAGNOSIS — N1831 Chronic kidney disease, stage 3a: Secondary | ICD-10-CM | POA: Diagnosis not present

## 2023-09-24 DIAGNOSIS — F17210 Nicotine dependence, cigarettes, uncomplicated: Secondary | ICD-10-CM | POA: Insufficient documentation

## 2023-09-24 DIAGNOSIS — C9110 Chronic lymphocytic leukemia of B-cell type not having achieved remission: Secondary | ICD-10-CM | POA: Diagnosis not present

## 2023-09-24 DIAGNOSIS — I129 Hypertensive chronic kidney disease with stage 1 through stage 4 chronic kidney disease, or unspecified chronic kidney disease: Secondary | ICD-10-CM | POA: Diagnosis not present

## 2023-09-24 DIAGNOSIS — R590 Localized enlarged lymph nodes: Secondary | ICD-10-CM | POA: Diagnosis not present

## 2023-09-24 DIAGNOSIS — N183 Chronic kidney disease, stage 3 unspecified: Secondary | ICD-10-CM | POA: Insufficient documentation

## 2023-09-24 LAB — CBC WITH DIFFERENTIAL (CANCER CENTER ONLY)
Abs Immature Granulocytes: 0.2 10*3/uL — ABNORMAL HIGH (ref 0.00–0.07)
Basophils Absolute: 0.1 10*3/uL (ref 0.0–0.1)
Basophils Relative: 0 %
Eosinophils Absolute: 0.3 10*3/uL (ref 0.0–0.5)
Eosinophils Relative: 1 %
HCT: 37.2 % (ref 36.0–46.0)
Hemoglobin: 11.8 g/dL — ABNORMAL LOW (ref 12.0–15.0)
Immature Granulocytes: 0 %
Lymphocytes Relative: 85 %
Lymphs Abs: 58.6 10*3/uL — ABNORMAL HIGH (ref 0.7–4.0)
MCH: 29.8 pg (ref 26.0–34.0)
MCHC: 31.7 g/dL (ref 30.0–36.0)
MCV: 93.9 fL (ref 80.0–100.0)
Monocytes Absolute: 5.5 10*3/uL — ABNORMAL HIGH (ref 0.1–1.0)
Monocytes Relative: 8 %
Neutro Abs: 4 10*3/uL (ref 1.7–7.7)
Neutrophils Relative %: 6 %
Platelet Count: 196 10*3/uL (ref 150–400)
RBC: 3.96 MIL/uL (ref 3.87–5.11)
RDW: 14.1 % (ref 11.5–15.5)
Smear Review: NORMAL
WBC Count: 68.8 10*3/uL (ref 4.0–10.5)
nRBC: 0 % (ref 0.0–0.2)

## 2023-09-24 LAB — CMP (CANCER CENTER ONLY)
ALT: 13 U/L (ref 0–44)
AST: 13 U/L — ABNORMAL LOW (ref 15–41)
Albumin: 4.3 g/dL (ref 3.5–5.0)
Alkaline Phosphatase: 65 U/L (ref 38–126)
Anion gap: 9 (ref 5–15)
BUN: 8 mg/dL (ref 8–23)
CO2: 27 mmol/L (ref 22–32)
Calcium: 9.6 mg/dL (ref 8.9–10.3)
Chloride: 103 mmol/L (ref 98–111)
Creatinine: 0.83 mg/dL (ref 0.44–1.00)
GFR, Estimated: >60 mL/min (ref >60)
Glucose, Bld: 95 mg/dL (ref 70–99)
Potassium: 3.9 mmol/L (ref 3.5–5.1)
Sodium: 139 mmol/L (ref 135–145)
Total Bilirubin: 0.7 mg/dL (ref 0.0–1.2)
Total Protein: 6.8 g/dL (ref 6.5–8.1)

## 2023-09-24 NOTE — Assessment & Plan Note (Addendum)
 CLL  IgVH Somatic Hypermutation was not detected, 13Q del,  Progressive intra-abdominal lymphadenopathy, She is not complaint with her medication.  CT showed some progression of her abdominopelvic adenopathy-overall stable during the 4 months interval. Patient previously has agreed with taking Zanubrutinib however never started again. We discussed about the option of resuming treatment vs continue observation. Given that her lymphadenopathy appears to be stable and she has no significant constitutional symptoms I recommend watchful waiting

## 2023-09-24 NOTE — Assessment & Plan Note (Signed)
Encourage oral hydration. Avoid nephrotoxins. 

## 2023-09-24 NOTE — Telephone Encounter (Signed)
 received critical lab from [facility]  WBC 68.8  MD informed.

## 2023-09-24 NOTE — Progress Notes (Signed)
 Hematology/Oncology Progress note [phone] [phone]        REASON FOR VISIT Follow up for treatment of CLL  ASSESSMENT & PLAN:   Cancer Staging  CLL (chronic lymphocytic leukemia) (HCC) Staging form: Chronic Lymphocytic Leukemia / Small Lymphocytic Lymphoma, AJCC 8th Edition - Clinical: Modified Rai Stage I (Modified Rai risk: Intermediate, Lymphocytosis: Present, Adenopathy: Present, Organomegaly: Absent, Anemia: Absent, Thrombocytopenia: Absent) - Signed by [name], MD on 12/06/2021   CLL (chronic lymphocytic leukemia) (HCC) CLL  IgVH Somatic Hypermutation was not detected, 13Q del,  Progressive intra-abdominal lymphadenopathy, She is not complaint with her medication.  CT showed some progression of her abdominopelvic adenopathy-overall stable during the 4 months interval. Patient previously has agreed with taking Zanubrutinib however never started again. We discussed about the option of resuming treatment vs continue observation. Given that her lymphadenopathy appears to be stable and she has no significant constitutional symptoms I recommend watchful waiting   CKD (chronic kidney disease) stage 3, GFR 30-59 ml/min (HCC) Encourage oral hydration.  Avoid nephrotoxins.  Orders Placed This Encounter  Procedures   CMP (Cancer Center only)    Standing Status:   Future    Expected Date:   12/25/2023    Expiration Date:   09/23/2024   CBC with Differential (Cancer Center Only)    Standing Status:   Future    Expected Date:   12/25/2023    Expiration Date:   09/23/2024   Lactate dehydrogenase    Standing Status:   Future    Expected Date:   12/25/2023    Expiration Date:   09/23/2024    Follow up in 3 months  [name], MD, PhD [facility] Hematology Oncology 09/24/2023       HISTORY OF PRESENTING ILLNESS:  Karen [name] is a  78 y.o.  female with PMH listed below who presents for follow-up of CLL treatments. Oncology history summary listed as  below. Oncology History  CLL (chronic lymphocytic leukemia) (HCC)  09/24/2015 Imaging   10/14/2015 chest without contrast Images were independently reviewed by me Small pulmonary nodules are stable over the past year.  Considered benign.  Several lymph nodes are borderline enlarged, including the lower right paratracheal lymph node and a portacaval lymph node. 08/27/2017 US abdomen complete, no hepato-splenomegaly.   12/18/2017 Initial Diagnosis   CLL (chronic lymphocytic leukemia)   07/30/2017, peripheral blood flow cytometry showed CD5+, CD23+, CD38 negative monoclonal B-cell population consistent with a diagnosis of CLL   06/27/2019 Cancer Staging   Staging form: Chronic Lymphocytic Leukemia / Small Lymphocytic Lymphoma, AJCC 8th Edition - Clinical: Modified Rai Stage I (Modified Rai risk: Intermediate, Lymphocytosis: Present, Adenopathy: Present, Organomegaly: Absent, Anemia: Absent, Thrombocytopenia: Absent) - Signed by [name], MD on 12/06/2021   11/27/2021 Imaging   Outside facility CT Aorta and run off w wo  showed moderate atherosclerotic aortic disease.  Diffuse lightest of moderate disease of the bilateral [place]yca vasculature.Diffuse mild stenotic disease of the bilateral SFAs. In addition, patient has soft tissue mass/lymph node along the left pelvic sidewall measures 4.9 x 4.7 cm, numerous peritoneum subcu tissue nodule, a deep right pelvic nodule and numerous enlarged mesenteric and retroperitoneal lymph nodes are identified.  For example a right mesenteric lymph node measures 2.4 x 2.2 cm.  The left periaortic lymph node measures 4.4 x 2.0 cm.  A pericardial lymph node measures 2.6 x 1.3 cm.   01/03/2022 Pathology Results   Right abdomen lymph node biopsy showed chronic lymphocytic leukemia/small lymphocytic lymphoma  01/09/2022 - 02/06/2022 Chemotherapy   Acalabrutinib 100 mg twice daily  Stopped due to sensation of throat closing.   03/06/2022 -  Chemotherapy   Zanubrutinib, not  able to swallow due to esophagitis   03/09/2022 - 03/12/2022 Hospital Admission   Patient reports that she took 1 day of zanubrutinib around 03/06/2022.  She has had difficulty swallowing the pills. She went to emergency room on 03/09/2022 for evaluation of dysphagia symptoms.  Symptoms occurs with both liquids and solids.  Also midsternal chest pain.  She was found to have hypertension emergency with systolic blood pressure greater than 200.  Patient states that she is being compliant with her blood pressure pills. Patient was treated with hydralazine, Cardizem drip.  Blood pressure improves. During the admission, she has had a EGD done by Dr. Servando Snare which showed esophagitis.  No biopsy was taken due to patient being on anticoagulation (aspirin, Plavix, low-dose Xarelto 2.5 mg twice daily] plan is for patient to follow-up with Dr. Norma Fredrickson outpatient for repeat EGD while off anticoagulation.  Her PPI dose was increased. She was discharged on metoprolol, lisinopril, amlodipine and nitrates   03/09/2022 Imaging   CT angio chest/abdomen/pelvis for dissection 1. No evidence for thoracic or abdominal aortic aneurysm or dissection. 2. Lymphadenopathy is identified in the chest, abdomen, and pelvis in this patient with reported history of CLL. Mediastinal lymphadenopathy is similar to prior chest CT of 2017. A hepatoduodenal ligament lymph node included on that prior chest CT has increased in size in the interval. Additional abdominopelvic lymphadenopathy was not included on available prior imaging. Close follow-up recommended and comparison to old studies suggested to assess for progression. 3. Left pelvic soft tissue mass measures 5.5 x 4.4 cm. Left gonadal veins appear to arise directly from this lesion suggesting ovarian etiology. Pelvic ultrasound may prove helpful to further evaluate. 4. Bilateral renal lesions with attenuation too high to be simple cysts. These may be cysts complicated by proteinaceous  debris or hemorrhage, but MRI abdomen with and without contrast recommended to confirm. 5. Diffuse colonic diverticulosis without diverticulitis.6. Stable appearance of small right-sided pulmonary nodules,consistent with benign etiology.7. Aortic Atherosclerosis (ICD10-I70.0).   03/09/2022 Imaging   Pelvic ultrasound showed 1. 5.1 cm solid soft tissue mass in the left adnexa likely representing left ovarian origin. Given that this patient has a reported history of CLL, there is likely prior imaging available outside are system. Consider comparison to previous outside studies to assess chronicity of this finding.2. No evidence for ovarian torsion.3. Status post hysterectomy.    03/09/22 EGD showed Non-severe non-erosive esophagitis with no bleeding INTERVAL HISTORY Karen [name] is a 78 y.o. female who has above history reviewed by me today presents for follow up visit for CLL She is accompanied by her friend Mardelle Matte Currently off Zanubrutinib.  Denies weight loss, fever, chills, fatigue, night sweats.  Chronic back and hip pain.     Review of Systems  Constitutional:  Positive for fatigue. Negative for appetite change, chills, fever and unexpected weight change.  HENT:   Negative for hearing loss and voice change.   Eyes:  Negative for eye problems.  Respiratory:  Negative for chest tightness and cough.   Cardiovascular:  Negative for chest pain.  Gastrointestinal:  Negative for abdominal distention, abdominal pain, blood in stool and nausea.  Endocrine: Negative for hot flashes.  Genitourinary:  Negative for difficulty urinating and frequency.   Musculoskeletal:  Positive for back pain. Negative for arthralgias.  Skin:  Negative for itching and rash.  Neurological:  Negative for extremity weakness and headaches.  Hematological:  Negative for adenopathy.  Psychiatric/Behavioral:  Negative for confusion.      MEDICAL HISTORY:  Past Medical History:  Diagnosis Date   Anxiety     Artery occlusion    Asthma    Back pain    Bronchitis    Cancer (HCC)    Skin Cancer   CLL (chronic lymphocytic leukemia) (HCC)    Coronary artery disease    Depression    Dyspnea    GERD (gastroesophageal reflux disease)    Herpes    History of kidney stones    Hypercholesteremia    Hyperlipemia    Hypertension    Hypothyroidism    Myocardial infarction (HCC) 1999   Neuropathy    Restless leg syndrome    Sleep apnea    use C-PAP   Wears dentures    full upper, partial lower.  doesn't currently wear    SURGICAL HISTORY: Past Surgical History:  Procedure Laterality Date   COLONOSCOPY WITH PROPOFOL N/A 11/20/2016   Procedure: COLONOSCOPY WITH PROPOFOL;  Surgeon: Midge Minium, MD;  Location: [facility];  Service: Endoscopy;  Laterality: N/A;  sleep apnea   CORONARY ANGIOPLASTY WITH STENT PLACEMENT  2008   DILATION AND CURETTAGE OF UTERUS     ESOPHAGOGASTRODUODENOSCOPY (EGD) WITH PROPOFOL N/A 03/09/2022   Procedure: ESOPHAGOGASTRODUODENOSCOPY (EGD) WITH PROPOFOL;  Surgeon: Midge Minium, MD;  Location: [facility];  Service: Endoscopy;  Laterality: N/A;   EYE SURGERY Bilateral    Cataract Extraction with IOL   FRACTURE SURGERY     HAND SURGERY Left 12/2017   upper hand fracture repair   LEFT HEART CATH AND CORONARY ANGIOGRAPHY N/A 06/29/2017   Procedure: LEFT HEART CATH AND CORONARY ANGIOGRAPHY;  Surgeon: Laurier Nancy, MD;  Location: [facility];  Service: Cardiovascular;  Laterality: N/A;   LEFT HEART CATH AND CORONARY ANGIOGRAPHY Left 12/02/2018   Procedure: LEFT HEART CATH AND CORONARY ANGIOGRAPHY;  Surgeon: Laurier Nancy, MD;  Location: [facility];  Service: Cardiovascular;  Laterality: Left;   OPEN REDUCTION INTERNAL FIXATION (ORIF) DISTAL RADIAL FRACTURE Right 08/12/2018   Procedure: OPEN REDUCTION INTERNAL FIXATION (ORIF) DISTAL RADIAL FRACTURE;  Surgeon: Deeann Saint, MD;  Location: [facility];  Service: Orthopedics;  Laterality:  Right;   POLYPECTOMY  11/20/2016   Procedure: POLYPECTOMY;  Surgeon: Midge Minium, MD;  Location: [facility];  Service: Endoscopy;;   TUBAL LIGATION     VAGINAL HYSTERECTOMY     VULVECTOMY Right 01/31/2016   Procedure: WIDE EXCISION VULVECTOMY-RIGHT LABIA;  Surgeon: Herold Harms, MD;  Location: [facility];  Service: Gynecology;  Laterality: Right;    SOCIAL HISTORY: Social History   Socioeconomic History   Marital status: Single    Spouse name: Not on file   Number of children: 5   Years of education: Not on file   Highest education level: Not on file  Occupational History   Occupation: part time job    Comment: Landscape architect  Tobacco Use   Smoking status: Every Day    Current packs/day: 1.00    Average packs/day: 1 pack/day for 35.0 years (35.0 ttl pk-yrs)    Types: Cigarettes   Smokeless tobacco: Current    Types: Snuff  Vaping Use   Vaping status: Never Used  Substance and Sexual Activity   Alcohol use: No    Alcohol/week: 0.0 standard drinks of alcohol   Drug use: No   Sexual activity:  Not Currently    Partners: Male    Birth control/protection: Surgical  Other Topics Concern   Not on file  Social History Narrative   Not on file   Social Drivers of Health   Financial Resource Strain: Low Risk  (12/02/2018)   Overall Financial Resource Strain (CARDIA)    Difficulty of Paying Living Expenses: Not very hard  Food Insecurity: No Food Insecurity (05/14/2023)   Hunger Vital Sign    Worried About Running Out of Food in the Last Year: Never true    Ran Out of Food in the Last Year: Never true  Transportation Needs: No Transportation Needs (05/14/2023)   PRAPARE - Administrator, Civil Service (Medical): No    Lack of Transportation (Non-Medical): No  Physical Activity: Inactive (05/14/2023)   Exercise Vital Sign    Days of Exercise per Week: 0 days    Minutes of Exercise per Session: 0 min  Stress: No Stress Concern Present (05/14/2023)    [employer] of Occupational Health - Occupational Stress Questionnaire    Feeling of Stress : Only a little  Social Connections: Unknown (12/02/2018)   Social Connection and Isolation Panel [NHANES]    Frequency of Communication with Friends and Family: More than three times a week    Frequency of Social Gatherings with Friends and Family: Not on file    Attends Religious Services: Not on file    Active Member of Clubs or Organizations: Not on file    Attends Banker Meetings: Not on file    Marital Status: Not on file  Intimate Partner Violence: Not At Risk (05/14/2023)   Humiliation, Afraid, Rape, and Kick questionnaire    Fear of Current or Ex-Partner: No    Emotionally Abused: No    Physically Abused: No    Sexually Abused: No    FAMILY HISTORY: Family History  Problem Relation Age of Onset   Heart disease Father    Stroke Mother    Hypertension Mother    Lung cancer Brother    Heart attack Brother    Congestive Heart Failure Sister    Cervical cancer Sister    Cancer Neg Hx    Diabetes Neg Hx    Breast cancer Neg Hx     ALLERGIES:  is allergic to requip [ropinirole hcl], celebrex [celecoxib], meloxicam, nitrofurantoin, and ranexa [ranolazine].  MEDICATIONS:  Current Outpatient Medications  Medication Sig Dispense Refill   acetaminophen (TYLENOL) 500 MG tablet Take 500-1,000 mg by mouth every 6 (six) hours as needed for moderate pain or fever.     albuterol (VENTOLIN HFA) 108 (90 Base) MCG/ACT inhaler INHALE 2 PUFFS BY MOUTH 4 TIMES A DAY AS NEEDED 8.5 each 3   amLODipine (NORVASC) 5 MG tablet TAKE 1 TABLET BY MOUTH EVERY DAY 90 tablet 1   aspirin EC 81 MG tablet Take 1 tablet (81 mg total) by mouth daily. 30 tablet    atorvastatin (LIPITOR) 40 MG tablet TAKE 1 TABLET BY MOUTH EVERY DAY 90 tablet 3   clopidogrel (PLAVIX) 75 MG tablet TAKE 1 TABLET BY MOUTH EVERY DAY 90 tablet 1   conjugated estrogens (PREMARIN) vaginal cream USE 1 GRAM INTRAVAGINALLY  TWICE PER WEEK 30 g 3   DULoxetine (CYMBALTA) 60 MG capsule TAKE 1 CAPSULE BY MOUTH EVERY DAY 90 capsule 1   isosorbide mononitrate (IMDUR) 120 MG 24 hr tablet TAKE 1 TABLET BY MOUTH EVERY DAY 90 tablet 3   lisinopril (ZESTRIL) 20 MG tablet  TAKE 1 TABLET BY MOUTH EVERY DAY 90 tablet 1   meclizine (ANTIVERT) 25 MG tablet Take 1 tablet (25 mg total) by mouth 3 (three) times daily as needed for dizziness. 30 tablet 2   metoprolol succinate (TOPROL-XL) 50 MG 24 hr tablet TAKE 1 TABLET BY MOUTH EVERY DAY 90 tablet 3   nitroGLYCERIN (NITROSTAT) 0.4 MG SL tablet Place 1 tablet (0.4 mg total) under the tongue every 5 (five) minutes as needed for chest pain. 30 tablet 3   ondansetron (ZOFRAN) 4 MG tablet Take 1 tablet (4 mg total) by mouth every 8 (eight) hours as needed for nausea or vomiting. 60 tablet 1   oxybutynin (DITROPAN-XL) 10 MG 24 hr tablet Take 1 tablet (10 mg total) by mouth daily. 90 tablet 3   pantoprazole (PROTONIX) 40 MG tablet TAKE 1 TABLET BY MOUTH TWICE A DAY 180 tablet 1   traZODone (DESYREL) 50 MG tablet TAKE 1 TABLET BY MOUTH EVERYDAY AT BEDTIME 90 tablet 3   amoxicillin-clavulanate (AUGMENTIN) 875-125 MG tablet Take 1 tablet by mouth 2 (two) times daily. (Patient not taking: Reported on 09/24/2023) 14 tablet 0   methylPREDNISolone (MEDROL DOSEPAK) 4 MG TBPK tablet Use as directed (Patient not taking: Reported on 09/24/2023) 1 each 0   zanubrutinib (BRUKINSA) 80 MG capsule Take 2 capsules (160 mg total) by mouth 2 (two) times daily. (Patient not taking: Reported on 09/24/2023) 120 capsule 1   No current facility-administered medications for this visit.     PHYSICAL EXAMINATION: ECOG PERFORMANCE STATUS: 1 - Symptomatic but completely ambulatory Vitals:   09/24/23 1344 09/24/23 1354  BP: (!) 155/82 (!) 143/85  Pulse: 60   Resp: 18   Temp: (!) 97.4 F (36.3 C)   SpO2: 98%    Filed Weights   09/24/23 1344  Weight: 149 lb 11.2 oz (67.9 kg)    Physical Exam Constitutional:       General: She is not in acute distress.    Appearance: She is not diaphoretic.     Comments: Patient walks with a cane.  HENT:     Head: Normocephalic.  Eyes:     General: No scleral icterus.       Left eye: No discharge.  Neck:     Vascular: No JVD.  Cardiovascular:     Rate and Rhythm: Normal rate.  Pulmonary:     Effort: Pulmonary effort is normal. No respiratory distress.  Abdominal:     General: Bowel sounds are normal. There is no distension.     Palpations: Abdomen is soft.  Musculoskeletal:        General: Normal range of motion.     Cervical back: Normal range of motion.  Skin:    General: Skin is warm and dry.  Neurological:     Mental Status: She is alert and oriented to person, place, and time. Mental status is at baseline.     Motor: No abnormal muscle tone.  Psychiatric:        Mood and Affect: Mood and affect normal.      LABORATORY DATA:  I have reviewed the data as listed    Latest Ref Rng & Units 09/24/2023    1:34 PM 06/11/2023    1:03 PM 01/29/2023    1:18 PM  CBC  WBC 4.0 - 10.5 K/uL 68.8  55.4  39.5   Hemoglobin 12.0 - 15.0 g/dL 16.1  09.6  04.5   Hematocrit 36.0 - 46.0 % 37.2  34.4  35.1  Platelets 150 - 400 K/uL 196  214  160       Latest Ref Rng & Units 09/24/2023    1:34 PM 06/11/2023    1:04 PM 05/14/2023    1:05 PM  CMP  Glucose 70 - 99 mg/dL 95  629  90   BUN 8 - 23 mg/dL 8  10  11    Creatinine 0.44 - 1.00 mg/dL 5.28  4.13  2.44   Sodium 135 - 145 mmol/L 139  137  138   Potassium 3.5 - 5.1 mmol/L 3.9  3.4  4.3   Chloride 98 - 111 mmol/L 103  100  102   CO2 22 - 32 mmol/L 27  28  23    Calcium 8.9 - 10.3 mg/dL 9.6  9.4  9.6   Total Protein 6.5 - 8.1 g/dL 6.8  6.6  6.1   Total Bilirubin 0.0 - 1.2 mg/dL 0.7  0.3  0.4   Alkaline Phos 38 - 126 U/L 65  83  83   AST 15 - 41 U/L 13  13  14    ALT 0 - 44 U/L 13  14  16

## 2023-09-27 DIAGNOSIS — D0439 Carcinoma in situ of skin of other parts of face: Secondary | ICD-10-CM | POA: Diagnosis not present

## 2023-09-27 DIAGNOSIS — C44321 Squamous cell carcinoma of skin of nose: Secondary | ICD-10-CM | POA: Diagnosis not present

## 2023-09-28 ENCOUNTER — Ambulatory Visit: Payer: 59 | Admitting: Internal Medicine

## 2023-10-04 ENCOUNTER — Other Ambulatory Visit: Payer: Self-pay | Admitting: Family

## 2023-10-04 DIAGNOSIS — N1832 Chronic kidney disease, stage 3b: Secondary | ICD-10-CM

## 2023-10-05 ENCOUNTER — Ambulatory Visit: Payer: 59 | Admitting: Cardiovascular Disease

## 2023-10-05 ENCOUNTER — Encounter: Payer: Self-pay | Admitting: Cardiovascular Disease

## 2023-10-05 VITALS — BP 123/73 | HR 60 | Ht 62.0 in | Wt 146.0 lb

## 2023-10-05 DIAGNOSIS — I480 Paroxysmal atrial fibrillation: Secondary | ICD-10-CM

## 2023-10-05 DIAGNOSIS — F17210 Nicotine dependence, cigarettes, uncomplicated: Secondary | ICD-10-CM | POA: Diagnosis not present

## 2023-10-05 DIAGNOSIS — R0789 Other chest pain: Secondary | ICD-10-CM | POA: Diagnosis not present

## 2023-10-05 DIAGNOSIS — I259 Chronic ischemic heart disease, unspecified: Secondary | ICD-10-CM | POA: Diagnosis not present

## 2023-10-05 DIAGNOSIS — I340 Nonrheumatic mitral (valve) insufficiency: Secondary | ICD-10-CM

## 2023-10-05 DIAGNOSIS — Z0130 Encounter for examination of blood pressure without abnormal findings: Secondary | ICD-10-CM

## 2023-10-05 DIAGNOSIS — I2510 Atherosclerotic heart disease of native coronary artery without angina pectoris: Secondary | ICD-10-CM

## 2023-10-05 NOTE — Progress Notes (Signed)
 Cardiology Office Note   Date:  10/05/2023   ID:  Haillie, Radu [birth year], MRN [id number]  PCP:  Margaretann Loveless, MD  Cardiologist:  Adrian Blackwater, MD      History of Present Illness: Karen [name] is a 78 y.o. female who presents for No chief complaint on file.   Chest Pain  This is a recurrent problem. The current episode started 1 to 4 weeks ago. The onset quality is gradual. The problem occurs 2 to 4 times per day. The pain is at a severity of 6/10. The quality of the pain is described as tightness. Associated symptoms include malaise/fatigue, palpitations and shortness of breath.      Past Medical History:  Diagnosis Date   Anxiety    Artery occlusion    Asthma    Back pain    Bronchitis    Cancer (HCC)    Skin Cancer   CLL (chronic lymphocytic leukemia) (HCC)    Coronary artery disease    Depression    Dyspnea    GERD (gastroesophageal reflux disease)    Herpes    History of kidney stones    Hypercholesteremia    Hyperlipemia    Hypertension    Hypothyroidism    Myocardial infarction (HCC) 1999   Neuropathy    Restless leg syndrome    Sleep apnea    use C-PAP   Wears dentures    full upper, partial lower.  doesn't currently wear     Past Surgical History:  Procedure Laterality Date   COLONOSCOPY WITH PROPOFOL N/A 11/20/2016   Procedure: COLONOSCOPY WITH PROPOFOL;  Surgeon: Midge Minium, MD;  Location: [facility];  Service: Endoscopy;  Laterality: N/A;  sleep apnea   CORONARY ANGIOPLASTY WITH STENT PLACEMENT  2008   DILATION AND CURETTAGE OF UTERUS     ESOPHAGOGASTRODUODENOSCOPY (EGD) WITH PROPOFOL N/A 03/09/2022   Procedure: ESOPHAGOGASTRODUODENOSCOPY (EGD) WITH PROPOFOL;  Surgeon: Midge Minium, MD;  Location: [facility];  Service: Endoscopy;  Laterality: N/A;   EYE SURGERY Bilateral    Cataract Extraction with IOL   FRACTURE SURGERY     HAND SURGERY Left 12/2017   upper hand fracture repair   LEFT HEART CATH AND CORONARY  ANGIOGRAPHY N/A 06/29/2017   Procedure: LEFT HEART CATH AND CORONARY ANGIOGRAPHY;  Surgeon: Laurier Nancy, MD;  Location: [facility];  Service: Cardiovascular;  Laterality: N/A;   LEFT HEART CATH AND CORONARY ANGIOGRAPHY Left 12/02/2018   Procedure: LEFT HEART CATH AND CORONARY ANGIOGRAPHY;  Surgeon: Laurier Nancy, MD;  Location: [facility];  Service: Cardiovascular;  Laterality: Left;   OPEN REDUCTION INTERNAL FIXATION (ORIF) DISTAL RADIAL FRACTURE Right 08/12/2018   Procedure: OPEN REDUCTION INTERNAL FIXATION (ORIF) DISTAL RADIAL FRACTURE;  Surgeon: Deeann Saint, MD;  Location: [facility];  Service: Orthopedics;  Laterality: Right;   POLYPECTOMY  11/20/2016   Procedure: POLYPECTOMY;  Surgeon: Midge Minium, MD;  Location: [facility];  Service: Endoscopy;;   TUBAL LIGATION     VAGINAL HYSTERECTOMY     VULVECTOMY Right 01/31/2016   Procedure: WIDE EXCISION VULVECTOMY-RIGHT LABIA;  Surgeon: Herold Harms, MD;  Location: [facility] OR[facility];  Service: Gynecology;  Laterality: Right;     Current Outpatient Medications  Medication Sig Dispense Refill   cephALEXin (KEFLEX) 500 MG capsule Take 500 mg by mouth 2 (two) times daily.     acetaminophen (TYLENOL) 500 MG tablet Take 500-1,000 mg by mouth every 6 (six) hours as needed for moderate  pain or fever.     albuterol (VENTOLIN HFA) 108 (90 Base) MCG/ACT inhaler INHALE 2 PUFFS BY MOUTH 4 TIMES A DAY AS NEEDED 8.5 each 3   amLODipine (NORVASC) 5 MG tablet TAKE 1 TABLET BY MOUTH EVERY DAY 90 tablet 1   amoxicillin-clavulanate (AUGMENTIN) 875-125 MG tablet Take 1 tablet by mouth 2 (two) times daily. (Patient not taking: Reported on 09/24/2023) 14 tablet 0   aspirin EC 81 MG tablet Take 1 tablet (81 mg total) by mouth daily. 30 tablet    atorvastatin (LIPITOR) 40 MG tablet TAKE 1 TABLET BY MOUTH EVERY DAY 90 tablet 3   clopidogrel (PLAVIX) 75 MG tablet TAKE 1 TABLET BY MOUTH EVERY DAY 90 tablet 1   conjugated estrogens  (PREMARIN) vaginal cream USE 1 GRAM INTRAVAGINALLY TWICE PER WEEK 30 g 3   DULoxetine (CYMBALTA) 60 MG capsule TAKE 1 CAPSULE BY MOUTH EVERY DAY 90 capsule 1   isosorbide mononitrate (IMDUR) 120 MG 24 hr tablet TAKE 1 TABLET BY MOUTH EVERY DAY 90 tablet 3   lisinopril (ZESTRIL) 20 MG tablet TAKE 1 TABLET BY MOUTH EVERY DAY 90 tablet 1   meclizine (ANTIVERT) 25 MG tablet Take 1 tablet (25 mg total) by mouth 3 (three) times daily as needed for dizziness. 30 tablet 2   methylPREDNISolone (MEDROL DOSEPAK) 4 MG TBPK tablet Use as directed (Patient not taking: Reported on 09/24/2023) 1 each 0   metoprolol succinate (TOPROL-XL) 50 MG 24 hr tablet TAKE 1 TABLET BY MOUTH EVERY DAY 90 tablet 3   nitroGLYCERIN (NITROSTAT) 0.4 MG SL tablet Place 1 tablet (0.4 mg total) under the tongue every 5 (five) minutes as needed for chest pain. 30 tablet 3   ondansetron (ZOFRAN) 4 MG tablet Take 1 tablet (4 mg total) by mouth every 8 (eight) hours as needed for nausea or vomiting. 60 tablet 1   oxybutynin (DITROPAN-XL) 10 MG 24 hr tablet Take 1 tablet (10 mg total) by mouth daily. 90 tablet 3   pantoprazole (PROTONIX) 40 MG tablet TAKE 1 TABLET BY MOUTH TWICE A DAY 180 tablet 1   traZODone (DESYREL) 50 MG tablet TAKE 1 TABLET BY MOUTH EVERYDAY AT BEDTIME 90 tablet 3   zanubrutinib (BRUKINSA) 80 MG capsule Take 2 capsules (160 mg total) by mouth 2 (two) times daily. (Patient not taking: Reported on 09/24/2023) 120 capsule 1   No current facility-administered medications for this visit.    Allergies:   Requip [ropinirole hcl], Celebrex [celecoxib], Meloxicam, Nitrofurantoin, and Ranexa [ranolazine]    Social History:   reports that she has been smoking cigarettes. She has a 35 pack-year smoking history. Her smokeless tobacco use includes snuff. She reports that she does not drink alcohol and does not use drugs.   Family History:  family history includes Cervical cancer in her sister; Congestive Heart Failure in her sister;  Heart attack in her brother; Heart disease in her father; Hypertension in her mother; Lung cancer in her brother; Stroke in her mother.    ROS:     Review of Systems  Constitutional:  Positive for malaise/fatigue.  HENT: Negative.    Eyes: Negative.   Respiratory:  Positive for shortness of breath.   Cardiovascular:  Positive for chest pain and palpitations.  Gastrointestinal: Negative.   Genitourinary: Negative.   Musculoskeletal: Negative.   Skin: Negative.   Neurological: Negative.   Endo/Heme/Allergies: Negative.   Psychiatric/Behavioral: Negative.    All other systems reviewed and are negative.     All other  systems are reviewed and negative.    PHYSICAL EXAM: VS:  BP 123/73   Pulse 60   Ht 5\' 2"  (1.575 m)   Wt 146 lb (66.2 kg)   SpO2 96%   BMI 26.70 kg/m  , BMI Body mass index is 26.7 kg/m. Last weight:  Wt Readings from Last 3 Encounters:  10/05/23 146 lb (66.2 kg)  09/24/23 149 lb 11.2 oz (67.9 kg)  08/24/23 149 lb (67.6 kg)     Physical Exam Constitutional:      Appearance: Normal appearance.  Cardiovascular:     Rate and Rhythm: Normal rate and regular rhythm.     Heart sounds: Normal heart sounds.  Pulmonary:     Effort: Pulmonary effort is normal.     Breath sounds: Normal breath sounds.  Musculoskeletal:     Right lower leg: No edema.     Left lower leg: No edema.  Neurological:     Mental Status: She is alert.       EKG:   Recent Labs: 09/24/2023: ALT 13; BUN 8; Creatinine 0.83; Hemoglobin 11.8; Platelet Count 196; Potassium 3.9; Sodium 139    Lipid Panel    Component Value Date/Time   CHOL 107 05/14/2023 1305   TRIG 101 05/14/2023 1305   HDL 37 (L) 05/14/2023 1305   CHOLHDL 3.2 12/11/2022 1224   CHOLHDL 8.4 08/10/2020 0523   VLDL 17 08/10/2020 0523   LDLCALC 51 05/14/2023 1305      Other studies Reviewed: Additional studies/ records that were reviewed today include:  Review of the above records demonstrates:       No data  to display            ASSESSMENT AND PLAN:    ICD-10-CM   1. Other chest pain  R07.89 CT CORONARY MORPH W/CTA COR W/SCORE W/CA W/CM &/OR WO/CM    Basic metabolic panel   Recurrent chest pains and stress test 11/24 normal, will do ccta    2. Coronary artery disease involving native coronary artery of native heart without angina pectoris  I25.10 CT CORONARY MORPH W/CTA COR W/SCORE W/CA W/CM &/OR WO/CM    Basic metabolic panel    3. Paroxysmal atrial fibrillation (HCC)  I48.0 CT CORONARY MORPH W/CTA COR W/SCORE W/CA W/CM &/OR WO/CM    Basic metabolic panel    4. Nonrheumatic mitral valve regurgitation  I34.0 CT CORONARY MORPH W/CTA COR W/SCORE W/CA W/CM &/OR WO/CM    Basic metabolic panel    5. Chest pain due to myocardial ischemia, unspecified ischemic chest pain type  I25.9 CT CORONARY MORPH W/CTA COR W/SCORE W/CA W/CM &/OR WO/CM    Basic metabolic panel       Problem List Items Addressed This Visit       Cardiovascular and Mediastinum   Coronary artery disease   Relevant Orders   CT CORONARY MORPH W/CTA COR W/SCORE W/CA W/CM &/OR WO/CM   Basic metabolic panel   Paroxysmal atrial fibrillation (HCC)   Relevant Orders   CT CORONARY MORPH W/CTA COR W/SCORE W/CA W/CM &/OR WO/CM   Basic metabolic panel     Other   Chest pain - Primary   Relevant Orders   CT CORONARY MORPH W/CTA COR W/SCORE W/CA W/CM &/OR WO/CM   Basic metabolic panel   CT CORONARY MORPH W/CTA COR W/SCORE W/CA W/CM &/OR WO/CM   Basic metabolic panel   Other Visit Diagnoses       Nonrheumatic mitral valve regurgitation  Relevant Orders   CT CORONARY MORPH W/CTA COR W/SCORE W/CA W/CM &/OR WO/CM   Basic metabolic panel          Disposition:   Return in about 3 weeks (around 10/26/2023) for ccta nd f/u.    Total time spent: 30 minutes  Signed,  Adrian Blackwater, MD  10/05/2023 10:59 AM    [facility]

## 2023-10-16 DIAGNOSIS — C44329 Squamous cell carcinoma of skin of other parts of face: Secondary | ICD-10-CM | POA: Diagnosis not present

## 2023-10-16 DIAGNOSIS — D0439 Carcinoma in situ of skin of other parts of face: Secondary | ICD-10-CM | POA: Diagnosis not present

## 2023-10-23 ENCOUNTER — Ambulatory Visit: Payer: Self-pay | Admitting: Cardiovascular Disease

## 2023-10-23 ENCOUNTER — Ambulatory Visit (INDEPENDENT_AMBULATORY_CARE_PROVIDER_SITE_OTHER): Admitting: Cardiovascular Disease

## 2023-10-23 ENCOUNTER — Ambulatory Visit: Admitting: Cardiovascular Disease

## 2023-10-23 ENCOUNTER — Encounter: Payer: Self-pay | Admitting: Cardiovascular Disease

## 2023-10-23 ENCOUNTER — Other Ambulatory Visit

## 2023-10-23 ENCOUNTER — Other Ambulatory Visit: Payer: Self-pay | Admitting: Cardiovascular Disease

## 2023-10-23 VITALS — BP 127/83 | HR 59 | Ht 63.0 in | Wt 149.6 lb

## 2023-10-23 DIAGNOSIS — R0789 Other chest pain: Secondary | ICD-10-CM

## 2023-10-23 DIAGNOSIS — I5033 Acute on chronic diastolic (congestive) heart failure: Secondary | ICD-10-CM

## 2023-10-23 DIAGNOSIS — I259 Chronic ischemic heart disease, unspecified: Secondary | ICD-10-CM

## 2023-10-23 DIAGNOSIS — R9439 Abnormal result of other cardiovascular function study: Secondary | ICD-10-CM | POA: Insufficient documentation

## 2023-10-23 DIAGNOSIS — Z720 Tobacco use: Secondary | ICD-10-CM | POA: Diagnosis not present

## 2023-10-23 DIAGNOSIS — F17210 Nicotine dependence, cigarettes, uncomplicated: Secondary | ICD-10-CM

## 2023-10-23 DIAGNOSIS — I480 Paroxysmal atrial fibrillation: Secondary | ICD-10-CM | POA: Diagnosis not present

## 2023-10-23 DIAGNOSIS — I200 Unstable angina: Secondary | ICD-10-CM

## 2023-10-23 DIAGNOSIS — I25110 Atherosclerotic heart disease of native coronary artery with unstable angina pectoris: Secondary | ICD-10-CM

## 2023-10-23 DIAGNOSIS — I2510 Atherosclerotic heart disease of native coronary artery without angina pectoris: Secondary | ICD-10-CM

## 2023-10-23 DIAGNOSIS — R0602 Shortness of breath: Secondary | ICD-10-CM

## 2023-10-23 NOTE — Progress Notes (Addendum)
 Cardiology Office Note   Date:  10/23/2023   ID:  [name], [name] Aug 31, 1945, MRN [id number]  PCP:  Margaretann Loveless, MD  Cardiologist:  Adrian Blackwater, MD      History of Present Illness: Karen [name] is a 78 y.o. female who presents for  Chief Complaint  Patient presents with  . Follow-up    Discuss CCTA options     Has chest pain at rest, retrosternal area, radiating to neck. CCTA unable to do as had ct being down.     Past Medical History:  Diagnosis Date  . Anxiety   . Artery occlusion   . Asthma   . Back pain   . Bronchitis   . Cancer (HCC)    Skin Cancer  . CLL (chronic lymphocytic leukemia) (HCC)   . Coronary artery disease   . Depression   . Dyspnea   . GERD (gastroesophageal reflux disease)   . Herpes   . History of kidney stones   . Hypercholesteremia   . Hyperlipemia   . Hypertension   . Hypothyroidism   . Myocardial infarction (HCC) 1999  . Neuropathy   . Restless leg syndrome   . Sleep apnea    use C-PAP  . Wears dentures    full upper, partial lower.  doesn't currently wear     Past Surgical History:  Procedure Laterality Date  . COLONOSCOPY WITH PROPOFOL N/A 11/20/2016   Procedure: COLONOSCOPY WITH PROPOFOL;  Surgeon: Midge Minium, MD;  Location: [facility];  Service: Endoscopy;  Laterality: N/A;  sleep apnea  . CORONARY ANGIOPLASTY WITH STENT PLACEMENT  2008  . DILATION AND CURETTAGE OF UTERUS    . ESOPHAGOGASTRODUODENOSCOPY (EGD) WITH PROPOFOL N/A 03/09/2022   Procedure: ESOPHAGOGASTRODUODENOSCOPY (EGD) WITH PROPOFOL;  Surgeon: Midge Minium, MD;  Location: [facility] END[facility];  Service: Endoscopy;  Laterality: N/A;  . EYE SURGERY Bilateral    Cataract Extraction with IOL  . FRACTURE SURGERY    . HAND SURGERY Left 12/2017   upper hand fracture repair  . LEFT HEART CATH AND CORONARY ANGIOGRAPHY N/A 06/29/2017   Procedure: LEFT HEART CATH AND CORONARY ANGIOGRAPHY;  Surgeon: Laurier Nancy, MD;  Location: [facility];   Service: Cardiovascular;  Laterality: N/A;  . LEFT HEART CATH AND CORONARY ANGIOGRAPHY Left 12/02/2018   Procedure: LEFT HEART CATH AND CORONARY ANGIOGRAPHY;  Surgeon: Laurier Nancy, MD;  Location: [facility];  Service: Cardiovascular;  Laterality: Left;  . OPEN REDUCTION INTERNAL FIXATION (ORIF) DISTAL RADIAL FRACTURE Right 08/12/2018   Procedure: OPEN REDUCTION INTERNAL FIXATION (ORIF) DISTAL RADIAL FRACTURE;  Surgeon: Deeann Saint, MD;  Location: [facility];  Service: Orthopedics;  Laterality: Right;  . POLYPECTOMY  11/20/2016   Procedure: POLYPECTOMY;  Surgeon: Midge Minium, MD;  Location: [facility];  Service: Endoscopy;;  . TUBAL LIGATION    . VAGINAL HYSTERECTOMY    . VULVECTOMY Right 01/31/2016   Procedure: WIDE EXCISION VULVECTOMY-RIGHT LABIA;  Surgeon: Herold Harms, MD;  Location: [facility] OR[facility];  Service: Gynecology;  Laterality: Right;     Current Outpatient Medications  Medication Sig Dispense Refill  . albuterol (VENTOLIN HFA) 108 (90 Base) MCG/ACT inhaler INHALE 2 PUFFS BY MOUTH 4 TIMES A DAY AS NEEDED 8.5 each 3  . amLODipine (NORVASC) 5 MG tablet TAKE 1 TABLET BY MOUTH EVERY DAY 90 tablet 1  . aspirin EC 81 MG tablet Take 1 tablet (81 mg total) by mouth daily. 30 tablet   . atorvastatin (LIPITOR)  40 MG tablet TAKE 1 TABLET BY MOUTH EVERY DAY 90 tablet 3  . clopidogrel (PLAVIX) 75 MG tablet TAKE 1 TABLET BY MOUTH EVERY DAY 90 tablet 1  . conjugated estrogens (PREMARIN) vaginal cream USE 1 GRAM INTRAVAGINALLY TWICE PER WEEK 30 g 3  . DULoxetine (CYMBALTA) 60 MG capsule TAKE 1 CAPSULE BY MOUTH EVERY DAY 90 capsule 1  . isosorbide mononitrate (IMDUR) 120 MG 24 hr tablet TAKE 1 TABLET BY MOUTH EVERY DAY 90 tablet 3  . lisinopril (ZESTRIL) 20 MG tablet TAKE 1 TABLET BY MOUTH EVERY DAY 90 tablet 1  . meclizine (ANTIVERT) 25 MG tablet Take 1 tablet (25 mg total) by mouth 3 (three) times daily as needed for dizziness. 30 tablet 2  . metoprolol succinate  (TOPROL-XL) 50 MG 24 hr tablet TAKE 1 TABLET BY MOUTH EVERY DAY 90 tablet 3  . nitroGLYCERIN (NITROSTAT) 0.4 MG SL tablet Place 1 tablet (0.4 mg total) under the tongue every 5 (five) minutes as needed for chest pain. 30 tablet 3  . pantoprazole (PROTONIX) 40 MG tablet TAKE 1 TABLET BY MOUTH TWICE A DAY 180 tablet 1  . traZODone (DESYREL) 50 MG tablet TAKE 1 TABLET BY MOUTH EVERYDAY AT BEDTIME 90 tablet 3  . acetaminophen (TYLENOL) 500 MG tablet Take 500-1,000 mg by mouth every 6 (six) hours as needed for moderate pain or fever.    Marland Kitchen amoxicillin-clavulanate (AUGMENTIN) 875-125 MG tablet Take 1 tablet by mouth 2 (two) times daily. (Patient not taking: Reported on 09/24/2023) 14 tablet 0  . cephALEXin (KEFLEX) 500 MG capsule Take 500 mg by mouth 2 (two) times daily. (Patient not taking: Reported on 10/23/2023)    . methylPREDNISolone (MEDROL DOSEPAK) 4 MG TBPK tablet Use as directed (Patient not taking: Reported on 09/24/2023) 1 each 0  . ondansetron (ZOFRAN) 4 MG tablet Take 1 tablet (4 mg total) by mouth every 8 (eight) hours as needed for nausea or vomiting. (Patient not taking: Reported on 10/23/2023) 60 tablet 1  . oxybutynin (DITROPAN-XL) 10 MG 24 hr tablet Take 1 tablet (10 mg total) by mouth daily. (Patient not taking: Reported on 10/23/2023) 90 tablet 3  . zanubrutinib (BRUKINSA) 80 MG capsule Take 2 capsules (160 mg total) by mouth 2 (two) times daily. (Patient not taking: Reported on 09/24/2023) 120 capsule 1   No current facility-administered medications for this visit.    Allergies:   Requip [ropinirole hcl], Celebrex [celecoxib], Meloxicam, Nitrofurantoin, and Ranexa [ranolazine]    Social History:   reports that she has been smoking cigarettes. She has a 35 pack-year smoking history. Her smokeless tobacco use includes snuff. She reports that she does not drink alcohol and does not use drugs.   Family History:  family history includes Cervical cancer in her sister; Congestive Heart Failure in her  sister; Heart attack in her brother; Heart disease in her father; Hypertension in her mother; Lung cancer in her brother; Stroke in her mother.    ROS:     Review of Systems  Constitutional: Negative.   HENT: Negative.    Eyes: Negative.   Respiratory: Negative.    Gastrointestinal: Negative.   Genitourinary: Negative.   Musculoskeletal: Negative.   Skin: Negative.   Neurological: Negative.   Endo/Heme/Allergies: Negative.   Psychiatric/Behavioral: Negative.    All other systems reviewed and are negative.     All other systems are reviewed and negative.    PHYSICAL EXAM: VS:  BP 127/83   Pulse (!) 59   Ht 5'  3" (1.6 m)   Wt 149 lb 9.6 oz (67.9 kg)   SpO2 98%   BMI 26.50 kg/m  , BMI Body mass index is 26.5 kg/m. Last weight:  Wt Readings from Last 3 Encounters:  10/23/23 149 lb 9.6 oz (67.9 kg)  10/05/23 146 lb (66.2 kg)  09/24/23 149 lb 11.2 oz (67.9 kg)     Physical Exam Constitutional:      Appearance: Normal appearance.  Cardiovascular:     Rate and Rhythm: Normal rate and regular rhythm.     Heart sounds: Normal heart sounds.  Pulmonary:     Effort: Pulmonary effort is normal.     Breath sounds: Normal breath sounds.  Musculoskeletal:     Right lower leg: No edema.     Left lower leg: No edema.  Neurological:     Mental Status: She is alert.      EKG:   Recent Labs: 09/24/2023: ALT 13; BUN 8; Creatinine 0.83; Hemoglobin 11.8; Platelet Count 196; Potassium 3.9; Sodium 139    Lipid Panel    Component Value Date/Time   CHOL 107 05/14/2023 1305   TRIG 101 05/14/2023 1305   HDL 37 (L) 05/14/2023 1305   CHOLHDL 3.2 12/11/2022 1224   CHOLHDL 8.4 08/10/2020 0523   VLDL 17 08/10/2020 0523   LDLCALC 51 05/14/2023 1305      Other studies Reviewed: Additional studies/ records that were reviewed today include:  Review of the above records demonstrates:       No data to display            ASSESSMENT AND PLAN:    ICD-10-CM   1. Unstable  angina (HCC)  I20.0 Comprehensive metabolic panel    CBC with Differential/Platelet   Had normal stress test 10/24, and has chest pain at rest radiating to neck. CCTA unable  to do, with h/o Mid RCA 60% and LAD 50 % cath 2020. repeat cath.    2. Paroxysmal atrial fibrillation (HCC)  I48.0 Comprehensive metabolic panel    CBC with Differential/Platelet    3. Coronary artery disease involving native coronary artery of native heart without angina pectoris  I25.10 Comprehensive metabolic panel    CBC with Differential/Platelet    4. Other chest pain  R07.89 Comprehensive metabolic panel    CBC with Differential/Platelet    5. Chest pain due to myocardial ischemia, unspecified ischemic chest pain type  I25.9 Comprehensive metabolic panel    CBC with Differential/Platelet    6. Tobacco abuse  Z72.0 Comprehensive metabolic panel    CBC with Differential/Platelet       Problem List Items Addressed This Visit       Cardiovascular and Mediastinum   Unstable angina (HCC) - Primary   Relevant Orders   Comprehensive metabolic panel   CBC with Differential/Platelet   Coronary artery disease   Relevant Orders   Comprehensive metabolic panel   CBC with Differential/Platelet   Paroxysmal atrial fibrillation (HCC)   Relevant Orders   Comprehensive metabolic panel   CBC with Differential/Platelet     Other   Tobacco abuse   Relevant Orders   Comprehensive metabolic panel   CBC with Differential/Platelet   Chest pain   Relevant Orders   Comprehensive metabolic panel   CBC with Differential/Platelet   Comprehensive metabolic panel   CBC with Differential/Platelet       Disposition:   Return for set up cardiac cath and f/u.    Total time spent: 45 minutes  Signed,  Adrian Blackwater, MD  10/23/2023 1:39 PM    [facility]

## 2023-10-29 ENCOUNTER — Ambulatory Visit: Admitting: Cardiovascular Disease

## 2023-10-30 ENCOUNTER — Encounter: Payer: Self-pay | Admitting: Internal Medicine

## 2023-10-30 ENCOUNTER — Ambulatory Visit (INDEPENDENT_AMBULATORY_CARE_PROVIDER_SITE_OTHER): Admitting: Internal Medicine

## 2023-10-30 ENCOUNTER — Ambulatory Visit (INDEPENDENT_AMBULATORY_CARE_PROVIDER_SITE_OTHER)

## 2023-10-30 VITALS — BP 160/88 | HR 44 | Ht 63.0 in | Wt 149.8 lb

## 2023-10-30 DIAGNOSIS — R0789 Other chest pain: Secondary | ICD-10-CM

## 2023-10-30 DIAGNOSIS — R079 Chest pain, unspecified: Secondary | ICD-10-CM | POA: Insufficient documentation

## 2023-10-30 DIAGNOSIS — R0681 Apnea, not elsewhere classified: Secondary | ICD-10-CM | POA: Diagnosis not present

## 2023-10-30 DIAGNOSIS — I10 Essential (primary) hypertension: Secondary | ICD-10-CM | POA: Diagnosis not present

## 2023-10-30 DIAGNOSIS — J189 Pneumonia, unspecified organism: Secondary | ICD-10-CM

## 2023-10-30 DIAGNOSIS — Z720 Tobacco use: Secondary | ICD-10-CM | POA: Diagnosis not present

## 2023-10-30 DIAGNOSIS — C44329 Squamous cell carcinoma of skin of other parts of face: Secondary | ICD-10-CM | POA: Diagnosis not present

## 2023-10-30 DIAGNOSIS — R7303 Prediabetes: Secondary | ICD-10-CM

## 2023-10-30 DIAGNOSIS — F411 Generalized anxiety disorder: Secondary | ICD-10-CM | POA: Diagnosis not present

## 2023-10-30 DIAGNOSIS — I2510 Atherosclerotic heart disease of native coronary artery without angina pectoris: Secondary | ICD-10-CM | POA: Diagnosis not present

## 2023-10-30 DIAGNOSIS — I259 Chronic ischemic heart disease, unspecified: Secondary | ICD-10-CM | POA: Diagnosis not present

## 2023-10-30 DIAGNOSIS — R9439 Abnormal result of other cardiovascular function study: Secondary | ICD-10-CM | POA: Insufficient documentation

## 2023-10-30 DIAGNOSIS — E782 Mixed hyperlipidemia: Secondary | ICD-10-CM

## 2023-10-30 DIAGNOSIS — I480 Paroxysmal atrial fibrillation: Secondary | ICD-10-CM | POA: Diagnosis not present

## 2023-10-30 DIAGNOSIS — D0439 Carcinoma in situ of skin of other parts of face: Secondary | ICD-10-CM | POA: Diagnosis not present

## 2023-10-30 DIAGNOSIS — I200 Unstable angina: Secondary | ICD-10-CM | POA: Diagnosis not present

## 2023-10-30 MED ORDER — DULOXETINE HCL 60 MG PO CPEP: 60.0000 mg | ORAL_CAPSULE | Freq: Every day | ORAL | 1 refills | Status: DC

## 2023-10-30 NOTE — Progress Notes (Signed)
 Established Patient Office Visit  Subjective:  Patient ID: Karen [name], female    DOB: [birth year]  Age: 78 y.o. MRN: [id number]  Chief Complaint  Patient presents with   Follow-up    3 week follow up    Patient comes in for her follow-up today.  She just had a procedure done on her right cheek by the dermatologist, biopsy pending.  Patient also evaluated by the hematologist for his CLL, plan is watchful waiting.  Patient has been having unstable angina and is scheduled for a cardiac cath next week.  She will get blood work today.  Did not take her medications today so her blood pressure is high. Mentions loud snoring and witnessed apnea, and is concerned for her sleep apnea.  She was diagnosed few years ago but refused to wear a CPAP machine.  Will set up another PSG. She continues to smoke cigarettes but reports that she is trying to cut back. She was diagnosed with Pneumonia, needs a follow-up chest x-ray today.    No other concerns at this time.   Past Medical History:  Diagnosis Date   Anxiety    Artery occlusion    Asthma    Back pain    Bronchitis    Cancer (HCC)    Skin Cancer   CLL (chronic lymphocytic leukemia) (HCC)    Coronary artery disease    Depression    Dyspnea    GERD (gastroesophageal reflux disease)    Herpes    History of kidney stones    Hypercholesteremia    Hyperlipemia    Hypertension    Hypothyroidism    Myocardial infarction (HCC) 1999   Neuropathy    Restless leg syndrome    Sleep apnea    use C-PAP   Wears dentures    full upper, partial lower.  doesn't currently wear    Past Surgical History:  Procedure Laterality Date   COLONOSCOPY WITH PROPOFOL N/A 11/20/2016   Procedure: COLONOSCOPY WITH PROPOFOL;  Surgeon: Midge Minium, MD;  Location: [facility];  Service: Endoscopy;  Laterality: N/A;  sleep apnea   CORONARY ANGIOPLASTY WITH STENT PLACEMENT  2008   DILATION AND CURETTAGE OF UTERUS     ESOPHAGOGASTRODUODENOSCOPY (EGD)  WITH PROPOFOL N/A 03/09/2022   Procedure: ESOPHAGOGASTRODUODENOSCOPY (EGD) WITH PROPOFOL;  Surgeon: Midge Minium, MD;  Location: [facility];  Service: Endoscopy;  Laterality: N/A;   EYE SURGERY Bilateral    Cataract Extraction with IOL   FRACTURE SURGERY     HAND SURGERY Left 12/2017   upper hand fracture repair   LEFT HEART CATH AND CORONARY ANGIOGRAPHY N/A 06/29/2017   Procedure: LEFT HEART CATH AND CORONARY ANGIOGRAPHY;  Surgeon: Laurier Nancy, MD;  Location: [facility];  Service: Cardiovascular;  Laterality: N/A;   LEFT HEART CATH AND CORONARY ANGIOGRAPHY Left 12/02/2018   Procedure: LEFT HEART CATH AND CORONARY ANGIOGRAPHY;  Surgeon: Laurier Nancy, MD;  Location: [facility];  Service: Cardiovascular;  Laterality: Left;   OPEN REDUCTION INTERNAL FIXATION (ORIF) DISTAL RADIAL FRACTURE Right 08/12/2018   Procedure: OPEN REDUCTION INTERNAL FIXATION (ORIF) DISTAL RADIAL FRACTURE;  Surgeon: Deeann Saint, MD;  Location: [facility];  Service: Orthopedics;  Laterality: Right;   POLYPECTOMY  11/20/2016   Procedure: POLYPECTOMY;  Surgeon: Midge Minium, MD;  Location: [facility];  Service: Endoscopy;;   TUBAL LIGATION     VAGINAL HYSTERECTOMY     VULVECTOMY Right 01/31/2016   Procedure: WIDE EXCISION VULVECTOMY-RIGHT LABIA;  Surgeon:  Herold Harms, MD;  Location: [facility];  Service: Gynecology;  Laterality: Right;    Social History   Socioeconomic History   Marital status: Single    Spouse name: Not on file   Number of children: 5   Years of education: Not on file   Highest education level: Not on file  Occupational History   Occupation: part time job    Comment: [employer]  Tobacco Use   Smoking status: Every Day    Current packs/day: 1.00    Average packs/day: 1 pack/day for 35.0 years (35.0 ttl pk-yrs)    Types: Cigarettes   Smokeless tobacco: Current    Types: Snuff  Vaping Use   Vaping status: Never Used  Substance and Sexual Activity    Alcohol use: No    Alcohol/week: 0.0 standard drinks of alcohol   Drug use: No   Sexual activity: Not Currently    Partners: Male    Birth control/protection: Surgical  Other Topics Concern   Not on file  Social History Narrative   Not on file   Social Drivers of Health   Financial Resource Strain: Low Risk  (12/02/2018)   Overall Financial Resource Strain (CARDIA)    Difficulty of Paying Living Expenses: Not very hard  Food Insecurity: No Food Insecurity (05/14/2023)   Hunger Vital Sign    Worried About Running Out of Food in the Last Year: Never true    Ran Out of Food in the Last Year: Never true  Transportation Needs: No Transportation Needs (05/14/2023)   PRAPARE - Administrator, Civil Service (Medical): No    Lack of Transportation (Non-Medical): No  Physical Activity: Inactive (05/14/2023)   Exercise Vital Sign    Days of Exercise per Week: 0 days    Minutes of Exercise per Session: 0 min  Stress: No Stress Concern Present (05/14/2023)   [employer] of Occupational Health - Occupational Stress Questionnaire    Feeling of Stress : Only a little  Social Connections: Unknown (12/02/2018)   Social Connection and Isolation Panel [NHANES]    Frequency of Communication with Friends and Family: More than three times a week    Frequency of Social Gatherings with Friends and Family: Not on file    Attends Religious Services: Not on file    Active Member of Clubs or Organizations: Not on file    Attends Banker Meetings: Not on file    Marital Status: Not on file  Intimate Partner Violence: Not At Risk (05/14/2023)   Humiliation, Afraid, Rape, and Kick questionnaire    Fear of Current or Ex-Partner: No    Emotionally Abused: No    Physically Abused: No    Sexually Abused: No    Family History  Problem Relation Age of Onset   Heart disease Father    Stroke Mother    Hypertension Mother    Lung cancer Brother    Heart attack Brother     Congestive Heart Failure Sister    Cervical cancer Sister    Cancer Neg Hx    Diabetes Neg Hx    Breast cancer Neg Hx     Allergies  Allergen Reactions   Requip [Ropinirole Hcl] Anaphylaxis    Throat closing   Celebrex [Celecoxib] Itching   Meloxicam Palpitations   Nitrofurantoin Palpitations   Ranexa [Ranolazine] Hives    Outpatient Medications Prior to Visit  Medication Sig   acetaminophen (TYLENOL) 500 MG tablet Take 500-1,000 mg by  mouth every 6 (six) hours as needed for moderate pain or fever.   albuterol (VENTOLIN HFA) 108 (90 Base) MCG/ACT inhaler INHALE 2 PUFFS BY MOUTH 4 TIMES A DAY AS NEEDED   amLODipine (NORVASC) 5 MG tablet TAKE 1 TABLET BY MOUTH EVERY DAY   aspirin EC 81 MG tablet Take 1 tablet (81 mg total) by mouth daily.   atorvastatin (LIPITOR) 40 MG tablet TAKE 1 TABLET BY MOUTH EVERY DAY   clopidogrel (PLAVIX) 75 MG tablet TAKE 1 TABLET BY MOUTH EVERY DAY   conjugated estrogens (PREMARIN) vaginal cream USE 1 GRAM INTRAVAGINALLY TWICE PER WEEK   isosorbide mononitrate (IMDUR) 120 MG 24 hr tablet TAKE 1 TABLET BY MOUTH EVERY DAY   lisinopril (ZESTRIL) 20 MG tablet TAKE 1 TABLET BY MOUTH EVERY DAY   meclizine (ANTIVERT) 25 MG tablet Take 1 tablet (25 mg total) by mouth 3 (three) times daily as needed for dizziness.   metoprolol succinate (TOPROL-XL) 50 MG 24 hr tablet TAKE 1 TABLET BY MOUTH EVERY DAY   pantoprazole (PROTONIX) 40 MG tablet TAKE 1 TABLET BY MOUTH TWICE A DAY   traZODone (DESYREL) 50 MG tablet TAKE 1 TABLET BY MOUTH EVERYDAY AT BEDTIME   methylPREDNISolone (MEDROL DOSEPAK) 4 MG TBPK tablet Use as directed (Patient not taking: Reported on 08/24/2023)   nitroGLYCERIN (NITROSTAT) 0.4 MG SL tablet Place 1 tablet (0.4 mg total) under the tongue every 5 (five) minutes as needed for chest pain. (Patient not taking: Reported on 10/30/2023)   ondansetron (ZOFRAN) 4 MG tablet Take 1 tablet (4 mg total) by mouth every 8 (eight) hours as needed for nausea or vomiting.  (Patient not taking: Reported on 10/23/2023)   oxybutynin (DITROPAN-XL) 10 MG 24 hr tablet Take 1 tablet (10 mg total) by mouth daily. (Patient not taking: Reported on 10/30/2023)   zanubrutinib (BRUKINSA) 80 MG capsule Take 2 capsules (160 mg total) by mouth 2 (two) times daily. (Patient not taking: Reported on 10/30/2023)   [DISCONTINUED] amoxicillin-clavulanate (AUGMENTIN) 875-125 MG tablet Take 1 tablet by mouth 2 (two) times daily. (Patient not taking: Reported on 08/24/2023)   [DISCONTINUED] cephALEXin (KEFLEX) 500 MG capsule Take 500 mg by mouth 2 (two) times daily. (Patient not taking: Reported on 10/30/2023)   [DISCONTINUED] DULoxetine (CYMBALTA) 60 MG capsule TAKE 1 CAPSULE BY MOUTH EVERY DAY (Patient not taking: Reported on 10/30/2023)   No facility-administered medications prior to visit.    Review of Systems  Constitutional:  Positive for malaise/fatigue. Negative for chills, diaphoresis, fever and weight loss.  HENT: Negative.  Negative for congestion and sore throat.   Eyes: Negative.   Respiratory:  Negative for cough, shortness of breath and wheezing.   Cardiovascular: Negative.  Negative for chest pain, palpitations and leg swelling.  Gastrointestinal: Negative.  Negative for abdominal pain, blood in stool, constipation, diarrhea, heartburn, nausea and vomiting.  Genitourinary: Negative.  Negative for dysuria and flank pain.  Musculoskeletal: Negative.  Negative for joint pain and myalgias.  Skin: Negative.   Neurological: Negative.  Negative for dizziness and headaches.  Endo/Heme/Allergies: Negative.   Psychiatric/Behavioral: Negative.  Negative for depression and suicidal ideas. The patient is not nervous/anxious.        Objective:   BP (!) 160/88   Pulse (!) 44   Ht 5\' 3"  (1.6 m)   Wt 149 lb 12.8 oz (67.9 kg)   SpO2 99%   BMI 26.54 kg/m   Vitals:   10/30/23 1257  BP: (!) 160/88  Pulse: (!) 44  Height:  5\' 3"  (1.6 m)  Weight: 149 lb 12.8 oz (67.9 kg)  SpO2: 99%   BMI (Calculated): 26.54    Physical Exam Vitals and nursing note reviewed.  Constitutional:      Appearance: Normal appearance.  HENT:     Head: Normocephalic and atraumatic.     Nose: Nose normal.     Mouth/Throat:     Mouth: Mucous membranes are moist.     Pharynx: Oropharynx is clear.  Eyes:     Conjunctiva/sclera: Conjunctivae normal.     Pupils: Pupils are equal, round, and reactive to light.  Cardiovascular:     Rate and Rhythm: Normal rate and regular rhythm.     Pulses: Normal pulses.     Heart sounds: Normal heart sounds. No murmur heard. Pulmonary:     Effort: Pulmonary effort is normal.     Breath sounds: Normal breath sounds. No wheezing.  Abdominal:     General: Bowel sounds are normal.     Palpations: Abdomen is soft.     Tenderness: There is no abdominal tenderness. There is no right CVA tenderness or left CVA tenderness.  Musculoskeletal:        General: Normal range of motion.     Cervical back: Normal range of motion.     Right lower leg: No edema.     Left lower leg: No edema.  Skin:    General: Skin is warm and dry.  Neurological:     General: No focal deficit present.     Mental Status: She is alert and oriented to person, place, and time.  Psychiatric:        Mood and Affect: Mood normal.        Behavior: Behavior normal.      No results found for any visits on 10/30/23.  Recent Results (from the past 2160 hours)  POCT Urinalysis Dipstick ([address])     Status: None   Collection Time: 08/07/23  3:10 PM  Result Value Ref Range   Color, UA yellow    Clarity, UA clear    Glucose, UA Negative Negative   Bilirubin, UA neg    Ketones, UA neg    Spec Grav, UA 1.010 1.010 - 1.025   Blood, UA trace    pH, UA 7.0 5.0 - 8.0   Protein, UA Negative Negative   Urobilinogen, UA 0.2 0.2 or 1.0 E.U./dL   Nitrite, UA pos    Leukocytes, UA Negative Negative   Appearance clear    Odor yes   POC Influenza A&B (Binax test)     Status: None   Collection  Time: 08/24/23 10:44 AM  Result Value Ref Range   Influenza A, POC Negative Negative   Influenza B, POC Negative Negative  CMP (Cancer Center only)     Status: Abnormal   Collection Time: 09/24/23  1:34 PM  Result Value Ref Range   Sodium 139 135 - 145 mmol/L   Potassium 3.9 3.5 - 5.1 mmol/L   Chloride 103 98 - 111 mmol/L   CO2 27 22 - 32 mmol/L   Glucose, Bld 95 70 - 99 mg/dL    Comment: Glucose reference range applies only to samples taken after fasting for at least 8 hours.   BUN 8 8 - 23 mg/dL   Creatinine 6.04 5.40 - 1.00 mg/dL   Calcium 9.6 8.9 - 98.1 mg/dL   Total Protein 6.8 6.5 - 8.1 g/dL   Albumin 4.3 3.5 - 5.0 g/dL   AST  13 (L) 15 - 41 U/L   ALT 13 0 - 44 U/L   Alkaline Phosphatase 65 38 - 126 U/L   Total Bilirubin 0.7 0.0 - 1.2 mg/dL   GFR, Estimated >65 >78 mL/min    Comment: (NOTE) Calculated using the CKD-EPI Creatinine Equation (2021)    Anion gap 9 5 - 15    Comment: Performed at [facility], [address] [place], [place] [address]  CBC with Differential (Cancer Center Only)     Status: Abnormal   Collection Time: 09/24/23  1:34 PM  Result Value Ref Range   WBC Count 68.8 (HH) 4.0 - 10.5 K/uL    Comment: REPEATED TO VERIFY THIS CRITICAL RESULT HAS VERIFIED AND BEEN CALLED TO SANTOS,E BY CHASE ISLEY ON 03 03 2025 AT 1403, AND HAS BEEN READ BACK.     RBC 3.96 3.87 - 5.11 MIL/uL   Hemoglobin 11.8 (L) 12.0 - 15.0 g/dL   HCT 95.2 84.1 - 32.4 %   MCV 93.9 80.0 - 100.0 fL   MCH 29.8 26.0 - 34.0 pg   MCHC 31.7 30.0 - 36.0 g/dL   RDW 40.1 02.7 - 25.3 %   Platelet Count 196 150 - 400 K/uL   nRBC 0.0 0.0 - 0.2 %   Neutrophils Relative % 6 %   Neutro Abs 4.0 1.7 - 7.7 K/uL   Lymphocytes Relative 85 %   Lymphs Abs 58.6 (H) 0.7 - 4.0 K/uL   Monocytes Relative 8 %   Monocytes Absolute 5.5 (H) 0.1 - 1.0 K/uL   Eosinophils Relative 1 %   Eosinophils Absolute 0.3 0.0 - 0.5 K/uL   Basophils Relative 0 %   Basophils Absolute 0.1 0.0 - 0.1 K/uL   WBC  Morphology      Lymphocytosis with morphology consistent with known diagnosis of CLL.    Comment: SMUDGE CELLS DIFF. CONFIRMED BY SMEAR    RBC Morphology MORPHOLOGY UNREMARKABLE    Smear Review Normal platelet morphology     Comment: PLATELETS APPEAR ADEQUATE   nRBC HIDE 0 /100 WBC   Immature Granulocytes 0 %   Abs Immature Granulocytes 0.20 (H) 0.00 - 0.07 K/uL    Comment: Performed at [facility], [address] [place], [place] [address]      Assessment & Plan:  Continue current medications.  Repeat chest x-ray today for follow-up pneumonia.  Schedule sleep study. Problem List Items Addressed This Visit     Tobacco abuse   Essential hypertension, benign - Primary   Mixed hyperlipidemia   Relevant Orders   Lipid Panel w/o Chol/HDL Ratio   GAD (generalized anxiety disorder)   Relevant Medications   DULoxetine (CYMBALTA) 60 MG capsule   Prediabetes   Relevant Orders   Hemoglobin A1c   Other Visit Diagnoses       Pneumonia of left lower lobe due to infectious organism       Relevant Orders   DG Chest 2 View     Witnessed episode of apnea       Relevant Orders   Ambulatory referral to Sleep Studies       Return in about 6 weeks (around 12/11/2023).   Total time spent: 30 minutes  Margaretann Loveless, MD  10/30/2023   This document may have been prepared by [facility] Voice Recognition software and as such may include unintentional dictation errors.

## 2023-10-31 ENCOUNTER — Telehealth: Payer: Self-pay | Admitting: Internal Medicine

## 2023-10-31 LAB — CBC WITH DIFFERENTIAL/PLATELET
Basophils Absolute: 0.1 10*3/uL (ref 0.0–0.2)
Basos: 0 %
EOS (ABSOLUTE): 0.2 10*3/uL (ref 0.0–0.4)
Eos: 0 %
Hematocrit: 36.9 % (ref 34.0–46.6)
Hemoglobin: 11.9 g/dL (ref 11.1–15.9)
Immature Grans (Abs): 0 10*3/uL (ref 0.0–0.1)
Immature Granulocytes: 0 %
Lymphocytes Absolute: 57.2 10*3/uL — ABNORMAL HIGH (ref 0.7–3.1)
Lymphs: 89 %
MCH: 30.2 pg (ref 26.6–33.0)
MCHC: 32.2 g/dL (ref 31.5–35.7)
MCV: 94 fL (ref 79–97)
Monocytes Absolute: 2.8 10*3/uL — ABNORMAL HIGH (ref 0.1–0.9)
Monocytes: 4 %
Neutrophils Absolute: 4.6 10*3/uL (ref 1.4–7.0)
Neutrophils: 7 %
Platelets: 191 10*3/uL (ref 150–450)
RBC: 3.94 x10E6/uL (ref 3.77–5.28)
RDW: 13.9 % (ref 11.7–15.4)
WBC: 65 10*3/uL (ref 3.4–10.8)

## 2023-10-31 LAB — LIPID PANEL W/O CHOL/HDL RATIO
Cholesterol, Total: 122 mg/dL (ref 100–199)
HDL: 36 mg/dL — ABNORMAL LOW (ref >39)
LDL Chol Calc (NIH): 63 mg/dL (ref 0–99)
Triglycerides: 126 mg/dL (ref 0–149)
VLDL Cholesterol Cal: 23 mg/dL (ref 5–40)

## 2023-10-31 LAB — COMPREHENSIVE METABOLIC PANEL WITH GFR
ALT: 10 IU/L (ref 0–32)
AST: 12 IU/L (ref 0–40)
Albumin: 4.6 g/dL (ref 3.8–4.8)
Alkaline Phosphatase: 87 IU/L (ref 44–121)
BUN/Creatinine Ratio: 11 — ABNORMAL LOW (ref 12–28)
BUN: 11 mg/dL (ref 8–27)
Bilirubin Total: 0.3 mg/dL (ref 0.0–1.2)
CO2: 26 mmol/L (ref 20–29)
Calcium: 10.2 mg/dL (ref 8.7–10.3)
Chloride: 103 mmol/L (ref 96–106)
Creatinine, Ser: 1.01 mg/dL — ABNORMAL HIGH (ref 0.57–1.00)
Globulin, Total: 2 g/dL (ref 1.5–4.5)
Glucose: 80 mg/dL (ref 70–99)
Potassium: 3.7 mmol/L (ref 3.5–5.2)
Sodium: 142 mmol/L (ref 134–144)
Total Protein: 6.6 g/dL (ref 6.0–8.5)
eGFR: 57 mL/min/{1.73_m2} — ABNORMAL LOW (ref >59)

## 2023-10-31 LAB — HEMOGLOBIN A1C
Est. average glucose Bld gHb Est-mCnc: 131 mg/dL
Hgb A1c MFr Bld: 6.2 % — ABNORMAL HIGH (ref 4.8–5.6)

## 2023-10-31 NOTE — Progress Notes (Signed)
 Patient notified

## 2023-10-31 NOTE — Telephone Encounter (Signed)
 Called to inform of patient's critical lab results. WBC came back at 65.0. Just FYI.

## 2023-11-02 ENCOUNTER — Ambulatory Visit: Admitting: Cardiovascular Disease

## 2023-11-06 ENCOUNTER — Encounter: Admission: RE | Payer: Self-pay | Source: Home / Self Care

## 2023-11-06 ENCOUNTER — Ambulatory Visit: Admission: RE | Admit: 2023-11-06 | Source: Home / Self Care | Admitting: Cardiovascular Disease

## 2023-11-06 DIAGNOSIS — R079 Chest pain, unspecified: Secondary | ICD-10-CM | POA: Insufficient documentation

## 2023-11-06 DIAGNOSIS — R0789 Other chest pain: Secondary | ICD-10-CM

## 2023-11-06 DIAGNOSIS — R9439 Abnormal result of other cardiovascular function study: Secondary | ICD-10-CM | POA: Insufficient documentation

## 2023-11-06 SURGERY — LEFT HEART CATH AND CORONARY ANGIOGRAPHY
Anesthesia: Moderate Sedation | Laterality: Left

## 2023-11-13 ENCOUNTER — Ambulatory Visit (INDEPENDENT_AMBULATORY_CARE_PROVIDER_SITE_OTHER): Admitting: Cardiovascular Disease

## 2023-11-13 ENCOUNTER — Encounter: Payer: Self-pay | Admitting: Cardiovascular Disease

## 2023-11-13 VITALS — BP 130/80 | HR 60 | Ht 63.0 in | Wt 148.4 lb

## 2023-11-13 DIAGNOSIS — I5033 Acute on chronic diastolic (congestive) heart failure: Secondary | ICD-10-CM | POA: Diagnosis not present

## 2023-11-13 DIAGNOSIS — R0789 Other chest pain: Secondary | ICD-10-CM | POA: Diagnosis not present

## 2023-11-13 DIAGNOSIS — Z720 Tobacco use: Secondary | ICD-10-CM

## 2023-11-13 DIAGNOSIS — I10 Essential (primary) hypertension: Secondary | ICD-10-CM

## 2023-11-13 DIAGNOSIS — E782 Mixed hyperlipidemia: Secondary | ICD-10-CM | POA: Diagnosis not present

## 2023-11-13 DIAGNOSIS — R0602 Shortness of breath: Secondary | ICD-10-CM | POA: Diagnosis not present

## 2023-11-13 NOTE — Progress Notes (Signed)
 Cardiology Office Note   Date:  11/13/2023   ID:  [name], [name] [birth year], MRN [id number]  PCP:  Aisha Hove, MD  Cardiologist:  Debborah Fairly, MD      History of Present Illness: Karen [name] is a 78 y.o. female who presents for  Chief Complaint  Patient presents with   Follow-up    consult    HPI    Past Medical History:  Diagnosis Date   Anxiety    Artery occlusion    Asthma    Back pain    Bronchitis    Cancer (HCC)    Skin Cancer   CLL (chronic lymphocytic leukemia) (HCC)    Coronary artery disease    Depression    Dyspnea    GERD (gastroesophageal reflux disease)    Herpes    History of kidney stones    Hypercholesteremia    Hyperlipemia    Hypertension    Hypothyroidism    Myocardial infarction (HCC) 1999   Neuropathy    Restless leg syndrome    Sleep apnea    use C-PAP   Wears dentures    full upper, partial lower.  doesn't currently wear     Past Surgical History:  Procedure Laterality Date   COLONOSCOPY WITH PROPOFOL  N/A 11/20/2016   Procedure: COLONOSCOPY WITH PROPOFOL ;  Surgeon: Marnee Sink, MD;  Location: [facility];  Service: Endoscopy;  Laterality: N/A;  sleep apnea   CORONARY ANGIOPLASTY WITH STENT PLACEMENT  2008   DILATION AND CURETTAGE OF UTERUS     ESOPHAGOGASTRODUODENOSCOPY (EGD) WITH PROPOFOL  N/A 03/09/2022   Procedure: ESOPHAGOGASTRODUODENOSCOPY (EGD) WITH PROPOFOL ;  Surgeon: Marnee Sink, MD;  Location: [facility];  Service: Endoscopy;  Laterality: N/A;   EYE SURGERY Bilateral    Cataract Extraction with IOL   FRACTURE SURGERY     HAND SURGERY Left 12/2017   upper hand fracture repair   LEFT HEART CATH AND CORONARY ANGIOGRAPHY N/A 06/29/2017   Procedure: LEFT HEART CATH AND CORONARY ANGIOGRAPHY;  Surgeon: Cherrie Cornwall, MD;  Location: [facility];  Service: Cardiovascular;  Laterality: N/A;   LEFT HEART CATH AND CORONARY ANGIOGRAPHY Left 12/02/2018   Procedure: LEFT HEART CATH AND CORONARY  ANGIOGRAPHY;  Surgeon: Cherrie Cornwall, MD;  Location: [facility];  Service: Cardiovascular;  Laterality: Left;   OPEN REDUCTION INTERNAL FIXATION (ORIF) DISTAL RADIAL FRACTURE Right 08/12/2018   Procedure: OPEN REDUCTION INTERNAL FIXATION (ORIF) DISTAL RADIAL FRACTURE;  Surgeon: Marlynn Singer, MD;  Location: [facility];  Service: Orthopedics;  Laterality: Right;   POLYPECTOMY  11/20/2016   Procedure: POLYPECTOMY;  Surgeon: Marnee Sink, MD;  Location: [facility];  Service: Endoscopy;;   TUBAL LIGATION     VAGINAL HYSTERECTOMY     VULVECTOMY Right 01/31/2016   Procedure: WIDE EXCISION VULVECTOMY-RIGHT LABIA;  Surgeon: Colan Dash, MD;  Location: [facility] OR[facility];  Service: Gynecology;  Laterality: Right;     Current Outpatient Medications  Medication Sig Dispense Refill   acetaminophen  (TYLENOL ) 500 MG tablet Take 1,000 mg by mouth every 6 (six) hours as needed for moderate pain (pain score 4-6) or fever.     albuterol  (VENTOLIN  HFA) 108 (90 Base) MCG/ACT inhaler INHALE 2 PUFFS BY MOUTH 4 TIMES A DAY AS NEEDED 8.5 each 3   amLODipine  (NORVASC ) 5 MG tablet TAKE 1 TABLET BY MOUTH EVERY DAY 90 tablet 1   aspirin  EC 81 MG tablet Take 1 tablet (81 mg total) by mouth daily. 30  tablet    atorvastatin  (LIPITOR) 40 MG tablet TAKE 1 TABLET BY MOUTH EVERY DAY 90 tablet 3   clopidogrel  (PLAVIX ) 75 MG tablet TAKE 1 TABLET BY MOUTH EVERY DAY 90 tablet 1   conjugated estrogens  (PREMARIN ) vaginal cream USE 1 GRAM INTRAVAGINALLY TWICE PER WEEK (Patient taking differently: USE 1 GRAM INTRAVAGINALLY ONCE PER WEEK) 30 g 3   DULoxetine  (CYMBALTA ) 60 MG capsule Take 1 capsule (60 mg total) by mouth daily. 90 capsule 1   isosorbide  mononitrate (IMDUR ) 120 MG 24 hr tablet TAKE 1 TABLET BY MOUTH EVERY DAY 90 tablet 3   lisinopril  (ZESTRIL ) 20 MG tablet TAKE 1 TABLET BY MOUTH EVERY DAY 90 tablet 1   meclizine  (ANTIVERT ) 25 MG tablet Take 1 tablet (25 mg total) by mouth 3 (three) times daily as needed  for dizziness. 30 tablet 2   metoprolol  succinate (TOPROL -XL) 50 MG 24 hr tablet TAKE 1 TABLET BY MOUTH EVERY DAY 90 tablet 3   nitroGLYCERIN  (NITROSTAT ) 0.4 MG SL tablet Place 1 tablet (0.4 mg total) under the tongue every 5 (five) minutes as needed for chest pain. 30 tablet 3   pantoprazole  (PROTONIX ) 40 MG tablet TAKE 1 TABLET BY MOUTH TWICE A DAY 180 tablet 1   traZODone  (DESYREL ) 50 MG tablet TAKE 1 TABLET BY MOUTH EVERYDAY AT BEDTIME 90 tablet 3   No current facility-administered medications for this visit.    Allergies:   Requip [ropinirole hcl], Celebrex [celecoxib], Meloxicam, Nitrofurantoin, and Ranexa [ranolazine]    Social History:   reports that she has been smoking cigarettes. She has a 35 pack-year smoking history. Her smokeless tobacco use includes snuff. She reports that she does not drink alcohol and does not use drugs.   Family History:  family history includes Cervical cancer in her sister; Congestive Heart Failure in her sister; Heart attack in her brother; Heart disease in her father; Hypertension in her mother; Lung cancer in her brother; Stroke in her mother.    ROS:     Review of Systems  Constitutional: Negative.   HENT: Negative.    Eyes: Negative.   Respiratory: Negative.    Gastrointestinal: Negative.   Genitourinary: Negative.   Musculoskeletal: Negative.   Skin: Negative.   Neurological: Negative.   Endo/Heme/Allergies: Negative.   Psychiatric/Behavioral: Negative.    All other systems reviewed and are negative.     All other systems are reviewed and negative.    PHYSICAL EXAM: VS:  BP 130/80   Pulse 60   Ht 5\' 3"  (1.6 m)   Wt 148 lb 6.4 oz (67.3 kg)   SpO2 96%   BMI 26.29 kg/m  , BMI Body mass index is 26.29 kg/m. Last weight:  Wt Readings from Last 3 Encounters:  11/13/23 148 lb 6.4 oz (67.3 kg)  10/30/23 149 lb 12.8 oz (67.9 kg)  10/23/23 149 lb 9.6 oz (67.9 kg)     Physical Exam Constitutional:      Appearance: Normal  appearance.  Cardiovascular:     Rate and Rhythm: Normal rate and regular rhythm.     Heart sounds: Normal heart sounds.  Pulmonary:     Effort: Pulmonary effort is normal.     Breath sounds: Normal breath sounds.  Musculoskeletal:     Right lower leg: No edema.     Left lower leg: No edema.  Neurological:     Mental Status: She is alert.       EKG:   Recent Labs: 10/30/2023: ALT 10; BUN  11; Creatinine, Ser 1.01; Hemoglobin 11.9; Platelets 191; Potassium 3.7; Sodium 142    Lipid Panel    Component Value Date/Time   CHOL 122 10/30/2023 1337   TRIG 126 10/30/2023 1337   HDL 36 (L) 10/30/2023 1337   CHOLHDL 3.2 12/11/2022 1224   CHOLHDL 8.4 08/10/2020 0523   VLDL 17 08/10/2020 0523   LDLCALC 63 10/30/2023 1337      Other studies Reviewed: Additional studies/ records that were reviewed today include:  Review of the above records demonstrates:       No data to display            ASSESSMENT AND PLAN:    ICD-10-CM   1. Other chest pain  R07.89    When heart beats skip feels tightness in chest. Had appoitment for cath but missed it. Has cancer and wants treated medically as WBC 65.    2. SOB (shortness of breath)  R06.02     3. CHF (congestive heart failure), NYHA class III, acute on chronic, diastolic (HCC)  I50.33     4. Essential hypertension, benign  I10     5. Mixed hyperlipidemia  E78.2     6. Tobacco abuse  Z72.0        Problem List Items Addressed This Visit       Cardiovascular and Mediastinum   Essential hypertension, benign     Other   Tobacco abuse   Mixed hyperlipidemia   Chest pain - Primary   Other Visit Diagnoses       SOB (shortness of breath)         CHF (congestive heart failure), NYHA class III, acute on chronic, diastolic (HCC)              Disposition:   Return in about 4 weeks (around 12/11/2023).    Total time spent: 35 minutes  Signed,  Debborah Fairly, MD  11/13/2023 1:56 PM    [facility]

## 2023-12-08 ENCOUNTER — Other Ambulatory Visit: Payer: Self-pay | Admitting: Cardiovascular Disease

## 2023-12-11 ENCOUNTER — Encounter: Payer: Self-pay | Admitting: Cardiology

## 2023-12-11 ENCOUNTER — Ambulatory Visit (INDEPENDENT_AMBULATORY_CARE_PROVIDER_SITE_OTHER): Admitting: Cardiology

## 2023-12-11 VITALS — BP 150/62 | HR 51 | Ht 62.0 in | Wt 147.2 lb

## 2023-12-11 DIAGNOSIS — R7303 Prediabetes: Secondary | ICD-10-CM | POA: Diagnosis not present

## 2023-12-11 DIAGNOSIS — G4733 Obstructive sleep apnea (adult) (pediatric): Secondary | ICD-10-CM

## 2023-12-11 DIAGNOSIS — I10 Essential (primary) hypertension: Secondary | ICD-10-CM

## 2023-12-11 NOTE — Progress Notes (Signed)
 Established Patient Office Visit  Subjective:  Patient ID: Karen [name], female    DOB: 1945-11-06  Age: 78 y.o. MRN: [id number]  Chief Complaint  Patient presents with   Follow-up    6 weeks     Patient in office for 6 week follow up. Patient reports doing well. No new complaints.  Patient reports she is scheduled for a sleep study in June 2025. Seeing nephrology next week.  Recent chest xray negative for pneumonia.  Discussed recent lab work. A1c elevated, pre diabetic. LDL at goal. Kidney function slightly decreased, increase water  intake. Follow up with hematology for CLL.  Continue same medications.     No other concerns at this time.   Past Medical History:  Diagnosis Date   Anxiety    Artery occlusion    Asthma    Back pain    Bronchitis    Cancer (HCC)    Skin Cancer   CLL (chronic lymphocytic leukemia) (HCC)    Coronary artery disease    Depression    Dyspnea    GERD (gastroesophageal reflux disease)    Herpes    History of kidney stones    Hypercholesteremia    Hyperlipemia    Hypertension    Hypothyroidism    Myocardial infarction (HCC) 1999   Neuropathy    Restless leg syndrome    Sleep apnea    use C-PAP   Wears dentures    full upper, partial lower.  doesn't currently wear    Past Surgical History:  Procedure Laterality Date   COLONOSCOPY WITH PROPOFOL  N/A 11/20/2016   Procedure: COLONOSCOPY WITH PROPOFOL ;  Surgeon: Marnee Sink, MD;  Location: [facility];  Service: Endoscopy;  Laterality: N/A;  sleep apnea   CORONARY ANGIOPLASTY WITH STENT PLACEMENT  2008   DILATION AND CURETTAGE OF UTERUS     ESOPHAGOGASTRODUODENOSCOPY (EGD) WITH PROPOFOL  N/A 03/09/2022   Procedure: ESOPHAGOGASTRODUODENOSCOPY (EGD) WITH PROPOFOL ;  Surgeon: Marnee Sink, MD;  Location: [facility];  Service: Endoscopy;  Laterality: N/A;   EYE SURGERY Bilateral    Cataract Extraction with IOL   FRACTURE SURGERY     HAND SURGERY Left 12/2017   upper hand  fracture repair   LEFT HEART CATH AND CORONARY ANGIOGRAPHY N/A 06/29/2017   Procedure: LEFT HEART CATH AND CORONARY ANGIOGRAPHY;  Surgeon: Cherrie Cornwall, MD;  Location: [facility];  Service: Cardiovascular;  Laterality: N/A;   LEFT HEART CATH AND CORONARY ANGIOGRAPHY Left 12/02/2018   Procedure: LEFT HEART CATH AND CORONARY ANGIOGRAPHY;  Surgeon: Cherrie Cornwall, MD;  Location: [facility];  Service: Cardiovascular;  Laterality: Left;   OPEN REDUCTION INTERNAL FIXATION (ORIF) DISTAL RADIAL FRACTURE Right 08/12/2018   Procedure: OPEN REDUCTION INTERNAL FIXATION (ORIF) DISTAL RADIAL FRACTURE;  Surgeon: Marlynn Singer, MD;  Location: [facility];  Service: Orthopedics;  Laterality: Right;   POLYPECTOMY  11/20/2016   Procedure: POLYPECTOMY;  Surgeon: Marnee Sink, MD;  Location: [facility];  Service: Endoscopy;;   TUBAL LIGATION     VAGINAL HYSTERECTOMY     VULVECTOMY Right 01/31/2016   Procedure: WIDE EXCISION VULVECTOMY-RIGHT LABIA;  Surgeon: Colan Dash, MD;  Location: [facility] OR[facility];  Service: Gynecology;  Laterality: Right;    Social History   Socioeconomic History   Marital status: Single    Spouse name: Not on file   Number of children: 5   Years of education: Not on file   Highest education level: Not on file  Occupational History  Occupation: part time job    Comment: Landscape architect  Tobacco Use   Smoking status: Every Day    Current packs/day: 1.00    Average packs/day: 1 pack/day for 35.0 years (35.0 ttl pk-yrs)    Types: Cigarettes   Smokeless tobacco: Current    Types: Snuff  Vaping Use   Vaping status: Never Used  Substance and Sexual Activity   Alcohol use: No    Alcohol/week: 0.0 standard drinks of alcohol   Drug use: No   Sexual activity: Not Currently    Partners: Male    Birth control/protection: Surgical  Other Topics Concern   Not on file  Social History Narrative   Not on file   Social Drivers of Health   Financial Resource  Strain: Low Risk  (12/02/2018)   Overall Financial Resource Strain (CARDIA)    Difficulty of Paying Living Expenses: Not very hard  Food Insecurity: No Food Insecurity (05/14/2023)   Hunger Vital Sign    Worried About Running Out of Food in the Last Year: Never true    Ran Out of Food in the Last Year: Never true  Transportation Needs: No Transportation Needs (05/14/2023)   PRAPARE - Administrator, Civil Service (Medical): No    Lack of Transportation (Non-Medical): No  Physical Activity: Inactive (05/14/2023)   Exercise Vital Sign    Days of Exercise per Week: 0 days    Minutes of Exercise per Session: 0 min  Stress: No Stress Concern Present (05/14/2023)   [employer] of Occupational Health - Occupational Stress Questionnaire    Feeling of Stress : Only a little  Social Connections: Unknown (12/02/2018)   Social Connection and Isolation Panel [NHANES]    Frequency of Communication with Friends and Family: More than three times a week    Frequency of Social Gatherings with Friends and Family: Not on file    Attends Religious Services: Not on file    Active Member of Clubs or Organizations: Not on file    Attends Banker Meetings: Not on file    Marital Status: Not on file  Intimate Partner Violence: Not At Risk (05/14/2023)   Humiliation, Afraid, Rape, and Kick questionnaire    Fear of Current or Ex-Partner: No    Emotionally Abused: No    Physically Abused: No    Sexually Abused: No    Family History  Problem Relation Age of Onset   Heart disease Father    Stroke Mother    Hypertension Mother    Lung cancer Brother    Heart attack Brother    Congestive Heart Failure Sister    Cervical cancer Sister    Cancer Neg Hx    Diabetes Neg Hx    Breast cancer Neg Hx     Allergies  Allergen Reactions   Requip [Ropinirole Hcl] Anaphylaxis    Throat closing   Celebrex [Celecoxib] Itching   Meloxicam Palpitations   Nitrofurantoin Palpitations    Ranexa [Ranolazine] Hives    Outpatient Medications Prior to Visit  Medication Sig   acetaminophen  (TYLENOL ) 500 MG tablet Take 1,000 mg by mouth every 6 (six) hours as needed for moderate pain (pain score 4-6) or fever.   albuterol  (VENTOLIN  HFA) 108 (90 Base) MCG/ACT inhaler INHALE 2 PUFFS BY MOUTH 4 TIMES A DAY AS NEEDED   amLODipine  (NORVASC ) 5 MG tablet TAKE 1 TABLET BY MOUTH EVERY DAY   aspirin  EC 81 MG tablet Take 1 tablet (81 mg total)  by mouth daily.   atorvastatin  (LIPITOR) 40 MG tablet TAKE 1 TABLET BY MOUTH EVERY DAY   clopidogrel  (PLAVIX ) 75 MG tablet TAKE 1 TABLET BY MOUTH EVERY DAY   DULoxetine  (CYMBALTA ) 60 MG capsule Take 1 capsule (60 mg total) by mouth daily.   isosorbide  mononitrate (IMDUR ) 120 MG 24 hr tablet TAKE 1 TABLET BY MOUTH EVERY DAY   lisinopril  (ZESTRIL ) 20 MG tablet TAKE 1 TABLET BY MOUTH EVERY DAY   metoprolol  succinate (TOPROL -XL) 50 MG 24 hr tablet TAKE 1 TABLET BY MOUTH EVERY DAY   nitroGLYCERIN  (NITROSTAT ) 0.4 MG SL tablet Place 1 tablet (0.4 mg total) under the tongue every 5 (five) minutes as needed for chest pain.   pantoprazole  (PROTONIX ) 40 MG tablet TAKE 1 TABLET BY MOUTH TWICE A DAY   traZODone  (DESYREL ) 50 MG tablet TAKE 1 TABLET BY MOUTH EVERYDAY AT BEDTIME   conjugated estrogens  (PREMARIN ) vaginal cream USE 1 GRAM INTRAVAGINALLY TWICE PER WEEK (Patient not taking: Reported on 12/11/2023)   meclizine  (ANTIVERT ) 25 MG tablet Take 1 tablet (25 mg total) by mouth 3 (three) times daily as needed for dizziness. (Patient not taking: Reported on 12/11/2023)   No facility-administered medications prior to visit.    Review of Systems  Constitutional: Negative.   HENT: Negative.    Eyes: Negative.   Respiratory: Negative.  Negative for shortness of breath.   Cardiovascular: Negative.  Negative for chest pain.  Gastrointestinal: Negative.  Negative for abdominal pain, constipation and diarrhea.  Genitourinary: Negative.   Musculoskeletal:  Negative for  joint pain and myalgias.  Skin: Negative.   Neurological: Negative.  Negative for dizziness and headaches.  Endo/Heme/Allergies: Negative.   All other systems reviewed and are negative.      Objective:   BP (!) 150/62   Pulse (!) 51   Ht 5\' 2"  (1.575 m)   Wt 147 lb 3.2 oz (66.8 kg)   SpO2 96%   BMI 26.92 kg/m   Vitals:   12/11/23 1356  BP: (!) 150/62  Pulse: (!) 51  Height: 5\' 2"  (1.575 m)  Weight: 147 lb 3.2 oz (66.8 kg)  SpO2: 96%  BMI (Calculated): 26.92    Physical Exam Vitals and nursing note reviewed.  Constitutional:      Appearance: Normal appearance. She is normal weight.  HENT:     Head: Normocephalic and atraumatic.     Nose: Nose normal.     Mouth/Throat:     Mouth: Mucous membranes are moist.  Eyes:     Extraocular Movements: Extraocular movements intact.     Conjunctiva/sclera: Conjunctivae normal.     Pupils: Pupils are equal, round, and reactive to light.  Cardiovascular:     Rate and Rhythm: Normal rate and regular rhythm.     Pulses: Normal pulses.     Heart sounds: Normal heart sounds.  Pulmonary:     Effort: Pulmonary effort is normal.     Breath sounds: Normal breath sounds.  Abdominal:     General: Abdomen is flat. Bowel sounds are normal.     Palpations: Abdomen is soft.  Musculoskeletal:        General: Normal range of motion.     Cervical back: Normal range of motion.  Skin:    General: Skin is warm and dry.  Neurological:     General: No focal deficit present.     Mental Status: She is alert and oriented to person, place, and time.  Psychiatric:  Mood and Affect: Mood normal.        Behavior: Behavior normal.        Thought Content: Thought content normal.        Judgment: Judgment normal.      No results found for any visits on 12/11/23.  Recent Results (from the past 2160 hours)  CMP (Cancer Center only)     Status: Abnormal   Collection Time: 09/24/23  1:34 PM  Result Value Ref Range   Sodium 139 135 - 145  mmol/L   Potassium 3.9 3.5 - 5.1 mmol/L   Chloride 103 98 - 111 mmol/L   CO2 27 22 - 32 mmol/L   Glucose, Bld 95 70 - 99 mg/dL    Comment: Glucose reference range applies only to samples taken after fasting for at least 8 hours.   BUN 8 8 - 23 mg/dL   Creatinine 1.61 0.96 - 1.00 mg/dL   Calcium  9.6 8.9 - 10.3 mg/dL   Total Protein 6.8 6.5 - 8.1 g/dL   Albumin 4.3 3.5 - 5.0 g/dL   AST 13 (L) 15 - 41 U/L   ALT 13 0 - 44 U/L   Alkaline Phosphatase 65 38 - 126 U/L   Total Bilirubin 0.7 0.0 - 1.2 mg/dL   GFR, Estimated >04 >54 mL/min    Comment: (NOTE) Calculated using the CKD-EPI Creatinine Equation (2021)    Anion gap 9 5 - 15    Comment: Performed at [facility], [address] [place], [place] [address]  CBC with Differential (Cancer Center Only)     Status: Abnormal   Collection Time: 09/24/23  1:34 PM  Result Value Ref Range   WBC Count 68.8 (HH) 4.0 - 10.5 K/uL    Comment: REPEATED TO VERIFY THIS CRITICAL RESULT HAS VERIFIED AND BEEN CALLED TO SANTOS,E BY CHASE ISLEY ON 03 03 2025 AT 1403, AND HAS BEEN READ BACK.     RBC 3.96 3.87 - 5.11 MIL/uL   Hemoglobin 11.8 (L) 12.0 - 15.0 g/dL   HCT 91.4 78.2 - 95.6 %   MCV 93.9 80.0 - 100.0 fL   MCH 29.8 26.0 - 34.0 pg   MCHC 31.7 30.0 - 36.0 g/dL   RDW 21.3 08.6 - 57.8 %   Platelet Count 196 150 - 400 K/uL   nRBC 0.0 0.0 - 0.2 %   Neutrophils Relative % 6 %   Neutro Abs 4.0 1.7 - 7.7 K/uL   Lymphocytes Relative 85 %   Lymphs Abs 58.6 (H) 0.7 - 4.0 K/uL   Monocytes Relative 8 %   Monocytes Absolute 5.5 (H) 0.1 - 1.0 K/uL   Eosinophils Relative 1 %   Eosinophils Absolute 0.3 0.0 - 0.5 K/uL   Basophils Relative 0 %   Basophils Absolute 0.1 0.0 - 0.1 K/uL   WBC Morphology      Lymphocytosis with morphology consistent with known diagnosis of CLL.    Comment: SMUDGE CELLS DIFF. CONFIRMED BY SMEAR    RBC Morphology MORPHOLOGY UNREMARKABLE    Smear Review Normal platelet morphology     Comment: PLATELETS APPEAR  ADEQUATE   nRBC HIDE 0 /100 WBC   Immature Granulocytes 0 %   Abs Immature Granulocytes 0.20 (H) 0.00 - 0.07 K/uL    Comment: Performed at [facility], [address] [place], [place] [address]  Lipid Panel w/o Chol/HDL Ratio     Status: Abnormal   Collection Time: 10/30/23  1:37 PM  Result Value Ref  Range   Cholesterol, Total 122 100 - 199 mg/dL   Triglycerides 119 0 - 149 mg/dL   HDL 36 (L) >14 mg/dL   VLDL Cholesterol Cal 23 5 - 40 mg/dL   LDL Chol Calc (NIH) 63 0 - 99 mg/dL  Hemoglobin N8G     Status: Abnormal   Collection Time: 10/30/23  1:37 PM  Result Value Ref Range   Hgb A1c MFr Bld 6.2 (H) 4.8 - 5.6 %    Comment:          Prediabetes: 5.7 - 6.4          Diabetes: >6.4          Glycemic control for adults with diabetes: <7.0    Est. average glucose Bld gHb Est-mCnc 131 mg/dL  Comprehensive metabolic panel     Status: Abnormal   Collection Time: 10/30/23  1:46 PM  Result Value Ref Range   Glucose 80 70 - 99 mg/dL   BUN 11 8 - 27 mg/dL   Creatinine, Ser 9.56 (H) 0.57 - 1.00 mg/dL   eGFR 57 (L) >21 HY/QMV/7.84   BUN/Creatinine Ratio 11 (L) 12 - 28   Sodium 142 134 - 144 mmol/L   Potassium 3.7 3.5 - 5.2 mmol/L   Chloride 103 96 - 106 mmol/L   CO2 26 20 - 29 mmol/L   Calcium  10.2 8.7 - 10.3 mg/dL   Total Protein 6.6 6.0 - 8.5 g/dL   Albumin 4.6 3.8 - 4.8 g/dL   Globulin, Total 2.0 1.5 - 4.5 g/dL   Bilirubin Total 0.3 0.0 - 1.2 mg/dL   Alkaline Phosphatase 87 44 - 121 IU/L   AST 12 0 - 40 IU/L   ALT 10 0 - 32 IU/L  CBC with Differential/Platelet     Status: Abnormal   Collection Time: 10/30/23  1:47 PM  Result Value Ref Range   WBC 65.0 (HH) 3.4 - 10.8 x10E3/uL   RBC 3.94 3.77 - 5.28 x10E6/uL    Comment: Polychromasia present Elliptocytes present.    Hemoglobin 11.9 11.1 - 15.9 g/dL   Hematocrit 69.6 29.5 - 46.6 %   MCV 94 79 - 97 fL   MCH 30.2 26.6 - 33.0 pg   MCHC 32.2 31.5 - 35.7 g/dL   RDW 28.4 13.2 - 44.0 %   Platelets 191 150 - 450 x10E3/uL    Neutrophils 7 Not Estab. %   Lymphs 89 Not Estab. %    Comment: Smudge cells present Atypical lymphocytes.    Monocytes 4 Not Estab. %   Eos 0 Not Estab. %   Basos 0 Not Estab. %   Neutrophils Absolute 4.6 1.4 - 7.0 x10E3/uL   Lymphocytes Absolute 57.2 (H) 0.7 - 3.1 x10E3/uL   Monocytes Absolute 2.8 (H) 0.1 - 0.9 x10E3/uL   EOS (ABSOLUTE) 0.2 0.0 - 0.4 x10E3/uL   Basophils Absolute 0.1 0.0 - 0.2 x10E3/uL   Immature Granulocytes 0 Not Estab. %   Immature Grans (Abs) 0.0 0.0 - 0.1 x10E3/uL   Hematology Comments: Note:     Comment: Verified by microscopic examination.      Assessment & Plan:  Keep appointments with specialities.  Continue same medications.   Problem List Items Addressed This Visit       Cardiovascular and Mediastinum   Essential hypertension, benign     Respiratory   OSA on CPAP - Primary     Other   Prediabetes    Return in about 2 months (around 02/10/2024) for  with NK.   Total time spent: 25 minutes  [employer], NP  12/11/2023   This document may have been prepared by Dragon Voice Recognition software and as such may include unintentional dictation errors.

## 2023-12-13 ENCOUNTER — Other Ambulatory Visit: Payer: Self-pay | Admitting: Internal Medicine

## 2023-12-13 ENCOUNTER — Ambulatory Visit (INDEPENDENT_AMBULATORY_CARE_PROVIDER_SITE_OTHER): Admitting: Cardiovascular Disease

## 2023-12-13 ENCOUNTER — Other Ambulatory Visit: Payer: Self-pay | Admitting: Cardiovascular Disease

## 2023-12-13 ENCOUNTER — Encounter: Payer: Self-pay | Admitting: Cardiovascular Disease

## 2023-12-13 VITALS — BP 150/82 | HR 58 | Ht 62.0 in | Wt 147.8 lb

## 2023-12-13 DIAGNOSIS — Z720 Tobacco use: Secondary | ICD-10-CM

## 2023-12-13 DIAGNOSIS — I5033 Acute on chronic diastolic (congestive) heart failure: Secondary | ICD-10-CM | POA: Diagnosis not present

## 2023-12-13 DIAGNOSIS — R0602 Shortness of breath: Secondary | ICD-10-CM | POA: Diagnosis not present

## 2023-12-13 DIAGNOSIS — E782 Mixed hyperlipidemia: Secondary | ICD-10-CM | POA: Diagnosis not present

## 2023-12-13 DIAGNOSIS — R0789 Other chest pain: Secondary | ICD-10-CM

## 2023-12-13 DIAGNOSIS — I10 Essential (primary) hypertension: Secondary | ICD-10-CM

## 2023-12-13 DIAGNOSIS — R079 Chest pain, unspecified: Secondary | ICD-10-CM

## 2023-12-13 MED ORDER — AMLODIPINE BESYLATE 10 MG PO TABS: 10.0000 mg | ORAL_TABLET | Freq: Every day | ORAL | 11 refills | Status: AC

## 2023-12-13 NOTE — Progress Notes (Signed)
 Cardiology Office Note   Date:  12/13/2023   ID:  Karen [name], DOB [birth year], MRN [id number]  PCP:  Aisha Hove, MD  Cardiologist:  Debborah Fairly, MD      History of Present Illness: Karen [name] is a 78 y.o. female who presents for  Chief Complaint  Patient presents with   Follow-up    4 week folllow up    Infrequent episode palpitations.      Past Medical History:  Diagnosis Date   Anxiety    Artery occlusion    Asthma    Back pain    Bronchitis    Cancer (HCC)    Skin Cancer   CLL (chronic lymphocytic leukemia) (HCC)    Coronary artery disease    Depression    Dyspnea    GERD (gastroesophageal reflux disease)    Herpes    History of kidney stones    Hypercholesteremia    Hyperlipemia    Hypertension    Hypothyroidism    Myocardial infarction (HCC) 1999   Neuropathy    Restless leg syndrome    Sleep apnea    use C-PAP   Wears dentures    full upper, partial lower.  doesn't currently wear     Past Surgical History:  Procedure Laterality Date   COLONOSCOPY WITH PROPOFOL  N/A 11/20/2016   Procedure: COLONOSCOPY WITH PROPOFOL ;  Surgeon: Marnee Sink, MD;  Location: [facility];  Service: Endoscopy;  Laterality: N/A;  sleep apnea   CORONARY ANGIOPLASTY WITH STENT PLACEMENT  2008   DILATION AND CURETTAGE OF UTERUS     ESOPHAGOGASTRODUODENOSCOPY (EGD) WITH PROPOFOL  N/A 03/09/2022   Procedure: ESOPHAGOGASTRODUODENOSCOPY (EGD) WITH PROPOFOL ;  Surgeon: Marnee Sink, MD;  Location: [facility];  Service: Endoscopy;  Laterality: N/A;   EYE SURGERY Bilateral    Cataract Extraction with IOL   FRACTURE SURGERY     HAND SURGERY Left 12/2017   upper hand fracture repair   LEFT HEART CATH AND CORONARY ANGIOGRAPHY N/A 06/29/2017   Procedure: LEFT HEART CATH AND CORONARY ANGIOGRAPHY;  Surgeon: Cherrie Cornwall, MD;  Location: [facility];  Service: Cardiovascular;  Laterality: N/A;   LEFT HEART CATH AND CORONARY ANGIOGRAPHY Left 12/02/2018    Procedure: LEFT HEART CATH AND CORONARY ANGIOGRAPHY;  Surgeon: Cherrie Cornwall, MD;  Location: [facility];  Service: Cardiovascular;  Laterality: Left;   OPEN REDUCTION INTERNAL FIXATION (ORIF) DISTAL RADIAL FRACTURE Right 08/12/2018   Procedure: OPEN REDUCTION INTERNAL FIXATION (ORIF) DISTAL RADIAL FRACTURE;  Surgeon: Marlynn Singer, MD;  Location: [facility];  Service: Orthopedics;  Laterality: Right;   POLYPECTOMY  11/20/2016   Procedure: POLYPECTOMY;  Surgeon: Marnee Sink, MD;  Location: [facility];  Service: Endoscopy;;   TUBAL LIGATION     VAGINAL HYSTERECTOMY     VULVECTOMY Right 01/31/2016   Procedure: WIDE EXCISION VULVECTOMY-RIGHT LABIA;  Surgeon: Colan Dash, MD;  Location: [facility];  Service: Gynecology;  Laterality: Right;     Current Outpatient Medications  Medication Sig Dispense Refill   acetaminophen  (TYLENOL ) 500 MG tablet Take 1,000 mg by mouth every 6 (six) hours as needed for moderate pain (pain score 4-6) or fever.     albuterol  (VENTOLIN  HFA) 108 (90 Base) MCG/ACT inhaler INHALE 2 PUFFS BY MOUTH 4 TIMES A DAY AS NEEDED 8.5 each 3   amLODipine  (NORVASC ) 10 MG tablet Take 1 tablet (10 mg total) by mouth daily. 30 tablet 11   aspirin  EC 81 MG tablet Take  1 tablet (81 mg total) by mouth daily. 30 tablet    atorvastatin  (LIPITOR) 40 MG tablet TAKE 1 TABLET BY MOUTH EVERY DAY 90 tablet 3   clopidogrel  (PLAVIX ) 75 MG tablet TAKE 1 TABLET BY MOUTH EVERY DAY 90 tablet 1   DULoxetine  (CYMBALTA ) 60 MG capsule Take 1 capsule (60 mg total) by mouth daily. 90 capsule 1   isosorbide  mononitrate (IMDUR ) 120 MG 24 hr tablet TAKE 1 TABLET BY MOUTH EVERY DAY 90 tablet 3   lisinopril  (ZESTRIL ) 20 MG tablet TAKE 1 TABLET BY MOUTH EVERY DAY 90 tablet 1   meclizine  (ANTIVERT ) 25 MG tablet Take 1 tablet (25 mg total) by mouth 3 (three) times daily as needed for dizziness. 30 tablet 2   metoprolol  succinate (TOPROL -XL) 50 MG 24 hr tablet TAKE 1 TABLET BY MOUTH EVERY  DAY 90 tablet 3   nitroGLYCERIN  (NITROSTAT ) 0.4 MG SL tablet Place 1 tablet (0.4 mg total) under the tongue every 5 (five) minutes as needed for chest pain. 30 tablet 3   pantoprazole  (PROTONIX ) 40 MG tablet TAKE 1 TABLET BY MOUTH TWICE A DAY 180 tablet 1   traZODone  (DESYREL ) 50 MG tablet TAKE 1 TABLET BY MOUTH EVERYDAY AT BEDTIME 90 tablet 3   conjugated estrogens  (PREMARIN ) vaginal cream USE 1 GRAM INTRAVAGINALLY TWICE PER WEEK (Patient not taking: Reported on 12/11/2023) 30 g 3   No current facility-administered medications for this visit.    Allergies:   Requip [ropinirole hcl], Celebrex [celecoxib], Meloxicam, Nitrofurantoin, and Ranexa [ranolazine]    Social History:   reports that she has been smoking cigarettes. She has a 35 pack-year smoking history. Her smokeless tobacco use includes snuff. She reports that she does not drink alcohol and does not use drugs.   Family History:  family history includes Cervical cancer in her sister; Congestive Heart Failure in her sister; Heart attack in her brother; Heart disease in her father; Hypertension in her mother; Lung cancer in her brother; Stroke in her mother.    ROS:     Review of Systems  Constitutional: Negative.   HENT: Negative.    Eyes: Negative.   Respiratory: Negative.    Gastrointestinal: Negative.   Genitourinary: Negative.   Musculoskeletal: Negative.   Skin: Negative.   Neurological: Negative.   Endo/Heme/Allergies: Negative.   Psychiatric/Behavioral: Negative.    All other systems reviewed and are negative.     All other systems are reviewed and negative.    PHYSICAL EXAM: VS:  BP (!) 150/82   Pulse (!) 58   Ht 5\' 2"  (1.575 m)   Wt 147 lb 12.8 oz (67 kg)   SpO2 95%   BMI 27.03 kg/m  , BMI Body mass index is 27.03 kg/m. Last weight:  Wt Readings from Last 3 Encounters:  12/13/23 147 lb 12.8 oz (67 kg)  12/11/23 147 lb 3.2 oz (66.8 kg)  11/13/23 148 lb 6.4 oz (67.3 kg)     Physical  Exam Constitutional:      Appearance: Normal appearance.  Cardiovascular:     Rate and Rhythm: Normal rate and regular rhythm.     Heart sounds: Normal heart sounds.  Pulmonary:     Effort: Pulmonary effort is normal.     Breath sounds: Normal breath sounds.  Musculoskeletal:     Right lower leg: No edema.     Left lower leg: No edema.  Neurological:     Mental Status: She is alert.       EKG:  Recent Labs: 10/30/2023: ALT 10; BUN 11; Creatinine, Ser 1.01; Hemoglobin 11.9; Platelets 191; Potassium 3.7; Sodium 142    Lipid Panel    Component Value Date/Time   CHOL 122 10/30/2023 1337   TRIG 126 10/30/2023 1337   HDL 36 (L) 10/30/2023 1337   CHOLHDL 3.2 12/11/2022 1224   CHOLHDL 8.4 08/10/2020 0523   VLDL 17 08/10/2020 0523   LDLCALC 63 10/30/2023 1337      Other studies Reviewed: Additional studies/ records that were reviewed today include:  Review of the above records demonstrates:       No data to display            ASSESSMENT AND PLAN:    ICD-10-CM   1. Essential hypertension, benign  I10 amLODipine  (NORVASC ) 10 MG tablet   increase amlodapine to 10 mg daily as BP high    2. CHF (congestive heart failure), NYHA class III, acute on chronic, diastolic (HCC)  I50.33 amLODipine  (NORVASC ) 10 MG tablet    3. SOB (shortness of breath)  R06.02 amLODipine  (NORVASC ) 10 MG tablet    4. Mixed hyperlipidemia  E78.2 amLODipine  (NORVASC ) 10 MG tablet    5. Other chest pain  R07.89 amLODipine  (NORVASC ) 10 MG tablet   infrequent episode of palpitations.    6. Tobacco abuse  Z72.0 amLODipine  (NORVASC ) 10 MG tablet       Problem List Items Addressed This Visit       Cardiovascular and Mediastinum   Essential hypertension, benign - Primary   Relevant Medications   amLODipine  (NORVASC ) 10 MG tablet     Other   Tobacco abuse   Relevant Medications   amLODipine  (NORVASC ) 10 MG tablet   Mixed hyperlipidemia   Relevant Medications   amLODipine  (NORVASC ) 10  MG tablet   Chest pain   Relevant Medications   amLODipine  (NORVASC ) 10 MG tablet   Other Visit Diagnoses       CHF (congestive heart failure), NYHA class III, acute on chronic, diastolic (HCC)       Relevant Medications   amLODipine  (NORVASC ) 10 MG tablet     SOB (shortness of breath)       Relevant Medications   amLODipine  (NORVASC ) 10 MG tablet          Disposition:   Return in about 3 months (around 03/14/2024).    Total time spent: 30 minutes  Signed,  Debborah Fairly, MD  12/13/2023 1:28 PM    [facility]

## 2023-12-20 DIAGNOSIS — I10 Essential (primary) hypertension: Secondary | ICD-10-CM | POA: Diagnosis not present

## 2023-12-20 DIAGNOSIS — N1831 Chronic kidney disease, stage 3a: Secondary | ICD-10-CM | POA: Diagnosis not present

## 2023-12-20 DIAGNOSIS — E785 Hyperlipidemia, unspecified: Secondary | ICD-10-CM | POA: Diagnosis not present

## 2023-12-20 DIAGNOSIS — R8290 Unspecified abnormal findings in urine: Secondary | ICD-10-CM | POA: Diagnosis not present

## 2023-12-20 DIAGNOSIS — R809 Proteinuria, unspecified: Secondary | ICD-10-CM | POA: Diagnosis not present

## 2023-12-22 ENCOUNTER — Other Ambulatory Visit: Payer: Self-pay | Admitting: Internal Medicine

## 2023-12-22 DIAGNOSIS — I480 Paroxysmal atrial fibrillation: Secondary | ICD-10-CM

## 2023-12-25 ENCOUNTER — Inpatient Hospital Stay: Attending: Oncology

## 2023-12-25 ENCOUNTER — Inpatient Hospital Stay (HOSPITAL_BASED_OUTPATIENT_CLINIC_OR_DEPARTMENT_OTHER): Admitting: Oncology

## 2023-12-25 ENCOUNTER — Encounter: Payer: Self-pay | Admitting: Oncology

## 2023-12-25 ENCOUNTER — Telehealth: Payer: Self-pay

## 2023-12-25 VITALS — BP 143/77 | HR 62 | Temp 96.0°F | Resp 18 | Wt 146.7 lb

## 2023-12-25 DIAGNOSIS — N1831 Chronic kidney disease, stage 3a: Secondary | ICD-10-CM

## 2023-12-25 DIAGNOSIS — I129 Hypertensive chronic kidney disease with stage 1 through stage 4 chronic kidney disease, or unspecified chronic kidney disease: Secondary | ICD-10-CM | POA: Insufficient documentation

## 2023-12-25 DIAGNOSIS — C9110 Chronic lymphocytic leukemia of B-cell type not having achieved remission: Secondary | ICD-10-CM | POA: Diagnosis not present

## 2023-12-25 DIAGNOSIS — F17210 Nicotine dependence, cigarettes, uncomplicated: Secondary | ICD-10-CM | POA: Insufficient documentation

## 2023-12-25 DIAGNOSIS — N183 Chronic kidney disease, stage 3 unspecified: Secondary | ICD-10-CM | POA: Insufficient documentation

## 2023-12-25 LAB — CBC WITH DIFFERENTIAL (CANCER CENTER ONLY)
Abs Immature Granulocytes: 0.17 10*3/uL — ABNORMAL HIGH (ref 0.00–0.07)
Basophils Absolute: 0.1 10*3/uL (ref 0.0–0.1)
Basophils Relative: 0 %
Eosinophils Absolute: 0.4 10*3/uL (ref 0.0–0.5)
Eosinophils Relative: 1 %
HCT: 39.1 % (ref 36.0–46.0)
Hemoglobin: 12.3 g/dL (ref 12.0–15.0)
Immature Granulocytes: 0 %
Lymphocytes Relative: 90 %
Lymphs Abs: 74.8 10*3/uL — ABNORMAL HIGH (ref 0.7–4.0)
MCH: 29.9 pg (ref 26.0–34.0)
MCHC: 31.5 g/dL (ref 30.0–36.0)
MCV: 94.9 fL (ref 80.0–100.0)
Monocytes Absolute: 4 10*3/uL — ABNORMAL HIGH (ref 0.1–1.0)
Monocytes Relative: 5 %
Neutro Abs: 3.6 10*3/uL (ref 1.7–7.7)
Neutrophils Relative %: 4 %
Platelet Count: 219 10*3/uL (ref 150–400)
RBC: 4.12 MIL/uL (ref 3.87–5.11)
RDW: 14 % (ref 11.5–15.5)
Smear Review: NORMAL
WBC Count: 83 10*3/uL (ref 4.0–10.5)
nRBC: 0 % (ref 0.0–0.2)

## 2023-12-25 LAB — CMP (CANCER CENTER ONLY)
ALT: 11 U/L (ref 0–44)
AST: 13 U/L — ABNORMAL LOW (ref 15–41)
Albumin: 4.3 g/dL (ref 3.5–5.0)
Alkaline Phosphatase: 63 U/L (ref 38–126)
Anion gap: 8 (ref 5–15)
BUN: 13 mg/dL (ref 8–23)
CO2: 27 mmol/L (ref 22–32)
Calcium: 9.8 mg/dL (ref 8.9–10.3)
Chloride: 101 mmol/L (ref 98–111)
Creatinine: 1.13 mg/dL — ABNORMAL HIGH (ref 0.44–1.00)
GFR, Estimated: 49 mL/min — ABNORMAL LOW (ref >60)
Glucose, Bld: 90 mg/dL (ref 70–99)
Potassium: 3.7 mmol/L (ref 3.5–5.1)
Sodium: 136 mmol/L (ref 135–145)
Total Bilirubin: 0.5 mg/dL (ref 0.0–1.2)
Total Protein: 6.9 g/dL (ref 6.5–8.1)

## 2023-12-25 LAB — LACTATE DEHYDROGENASE: LDH: 142 U/L (ref 98–192)

## 2023-12-25 NOTE — Progress Notes (Signed)
 Hematology/Oncology Progress note [phone] [phone]        REASON FOR VISIT Follow up for treatment of CLL  ASSESSMENT & PLAN:   Cancer Staging  CLL (chronic lymphocytic leukemia) (HCC) Staging form: Chronic Lymphocytic Leukemia / Small Lymphocytic Lymphoma, AJCC 8th Edition - Clinical: Modified Rai Stage I (Modified Rai risk: Intermediate, Lymphocytosis: Present, Adenopathy: Present, Organomegaly: Absent, Anemia: Absent, Thrombocytopenia: Absent) - Signed by Timmy Forbes, MD on 12/06/2021   CLL (chronic lymphocytic leukemia) (HCC) CLL  IgVH Somatic Hypermutation was not detected, 13Q del,  Progressive intra-abdominal lymphadenopathy, She is not complaint with her medication.  Previous CT showed some progression of her abdominopelvic adenopathy Patient previously has agreed with taking Zanubrutinib  however never started. She is interested in trying Zanubrutinib  I recommend her to follow up in 2-3 weeks   CKD (chronic kidney disease) stage 3, GFR 30-59 ml/min (HCC) Encourage oral hydration.  Avoid nephrotoxins.  Orders Placed This Encounter  Procedures   CMP (Cancer Center only)    Standing Status:   Future    Expected Date:   01/08/2024    Expiration Date:   12/24/2024   CBC with Differential (Cancer Center Only)    Standing Status:   Future    Expected Date:   01/08/2024    Expiration Date:   12/24/2024    Follow up in 2-3 weeks  Timmy Forbes, MD, PhD [facility] Hematology Oncology 12/25/2023       HISTORY OF PRESENTING ILLNESS:  Karen [name] is a  78 y.o.  female with PMH listed below who presents for follow-up of CLL treatments. Oncology history summary listed as below. Oncology History  CLL (chronic lymphocytic leukemia) (HCC)  09/24/2015 Imaging   10/14/2015 chest without contrast Images were independently reviewed by me Small pulmonary nodules are stable over the past year.  Considered benign.  Several lymph nodes are borderline enlarged,  including the lower right paratracheal lymph node and a portacaval lymph node. 08/27/2017 US  abdomen complete, no hepato-splenomegaly.   12/18/2017 Initial Diagnosis   CLL (chronic lymphocytic leukemia)   07/30/2017, peripheral blood flow cytometry showed CD5+, CD23+, CD38 negative monoclonal B-cell population consistent with a diagnosis of CLL   06/27/2019 Cancer Staging   Staging form: Chronic Lymphocytic Leukemia / Small Lymphocytic Lymphoma, AJCC 8th Edition - Clinical: Modified Rai Stage I (Modified Rai risk: Intermediate, Lymphocytosis: Present, Adenopathy: Present, Organomegaly: Absent, Anemia: Absent, Thrombocytopenia: Absent) - Signed by Timmy Forbes, MD on 12/06/2021   11/27/2021 Imaging   Outside facility CT Aorta and run off w wo  showed moderate atherosclerotic aortic disease.  Diffuse lightest of moderate disease of the bilateral U[place]yca vasculature.Diffuse mild stenotic disease of the bilateral SFAs. In addition, patient has soft tissue mass/lymph node along the left pelvic sidewall measures 4.9 x 4.7 cm, numerous peritoneum subcu tissue nodule, a deep right pelvic nodule and numerous enlarged mesenteric and retroperitoneal lymph nodes are identified.  For example a right mesenteric lymph node measures 2.4 x 2.2 cm.  The left periaortic lymph node measures 4.4 x 2.0 cm.  A pericardial lymph node measures 2.6 x 1.3 cm.   01/03/2022 Pathology Results   Right abdomen lymph node biopsy showed chronic lymphocytic leukemia/small lymphocytic lymphoma    01/09/2022 - 02/06/2022 Chemotherapy   Acalabrutinib  100 mg twice daily  Stopped due to sensation of throat closing.   03/06/2022 -  Chemotherapy   Zanubrutinib , not able to swallow due to esophagitis   03/09/2022 - 03/12/2022 Hospital Admission   Patient reports  that she took 1 day of zanubrutinib  around 03/06/2022.  She has had difficulty swallowing the pills. She went to emergency room on 03/09/2022 for evaluation of dysphagia symptoms.  Symptoms  occurs with both liquids and solids.  Also midsternal chest pain.  She was found to have hypertension emergency with systolic blood pressure greater than 200.  Patient states that she is being compliant with her blood pressure pills. Patient was treated with hydralazine , Cardizem drip.  Blood pressure improves. During the admission, she has had a EGD done by Dr. Ole Berkeley which showed esophagitis.  No biopsy was taken due to patient being on anticoagulation (aspirin , Plavix , low-dose Xarelto  2.5 mg twice daily] plan is for patient to follow-up with Dr. Corky Diener outpatient for repeat EGD while off anticoagulation.  Her PPI dose was increased. She was discharged on metoprolol , lisinopril , amlodipine  and nitrates   03/09/2022 Imaging   CT angio chest/abdomen/pelvis for dissection 1. No evidence for thoracic or abdominal aortic aneurysm or dissection. 2. Lymphadenopathy is identified in the chest, abdomen, and pelvis in this patient with reported history of CLL. Mediastinal lymphadenopathy is similar to prior chest CT of 2017. A hepatoduodenal ligament lymph node included on that prior chest CT has increased in size in the interval. Additional abdominopelvic lymphadenopathy was not included on available prior imaging. Close follow-up recommended and comparison to old studies suggested to assess for progression. 3. Left pelvic soft tissue mass measures 5.5 x 4.4 cm. Left gonadal veins appear to arise directly from this lesion suggesting ovarian etiology. Pelvic ultrasound may prove helpful to further evaluate. 4. Bilateral renal lesions with attenuation too high to be simple cysts. These may be cysts complicated by proteinaceous debris or hemorrhage, but MRI abdomen with and without contrast recommended to confirm. 5. Diffuse colonic diverticulosis without diverticulitis.6. Stable appearance of small right-sided pulmonary nodules,consistent with benign etiology.7. Aortic Atherosclerosis (ICD10-I70.0).   03/09/2022  Imaging   Pelvic ultrasound showed 1. 5.1 cm solid soft tissue mass in the left adnexa likely representing left ovarian origin. Given that this patient has a reported history of CLL, there is likely prior imaging available outside are system. Consider comparison to previous outside studies to assess chronicity of this finding.2. No evidence for ovarian torsion.3. Status post hysterectomy.    03/09/22 EGD showed Non-severe non-erosive esophagitis with no bleeding INTERVAL HISTORY Karen [name] is a 78 y.o. female who has above history reviewed by me today presents for follow up visit for CLL She is accompanied by her friend Jonelle Neri Currently off Zanubrutinib .  She feels tired and has poor appetite. Has lost weight.  No fever, chills, fatigue, night sweats.  Chronic back and hip pain.     Review of Systems  Constitutional:  Positive for fatigue. Negative for appetite change, chills, fever and unexpected weight change.  HENT:   Negative for hearing loss and voice change.   Eyes:  Negative for eye problems.  Respiratory:  Negative for chest tightness and cough.   Cardiovascular:  Negative for chest pain.  Gastrointestinal:  Negative for abdominal distention, abdominal pain, blood in stool and nausea.  Endocrine: Negative for hot flashes.  Genitourinary:  Negative for difficulty urinating and frequency.   Musculoskeletal:  Positive for back pain. Negative for arthralgias.  Skin:  Negative for itching and rash.  Neurological:  Negative for extremity weakness and headaches.  Hematological:  Negative for adenopathy.  Psychiatric/Behavioral:  Negative for confusion.      MEDICAL HISTORY:  Past Medical History:  Diagnosis Date  Anxiety    Artery occlusion    Asthma    Back pain    Bronchitis    Cancer (HCC)    Skin Cancer   CLL (chronic lymphocytic leukemia) (HCC)    Coronary artery disease    Depression    Dyspnea    GERD (gastroesophageal reflux disease)    Herpes    History of  kidney stones    Hypercholesteremia    Hyperlipemia    Hypertension    Hypothyroidism    Myocardial infarction (HCC) 1999   Neuropathy    Restless leg syndrome    Sleep apnea    use C-PAP   Wears dentures    full upper, partial lower.  doesn't currently wear    SURGICAL HISTORY: Past Surgical History:  Procedure Laterality Date   COLONOSCOPY WITH PROPOFOL  N/A 11/20/2016   Procedure: COLONOSCOPY WITH PROPOFOL ;  Surgeon: Marnee Sink, MD;  Location: [facility];  Service: Endoscopy;  Laterality: N/A;  sleep apnea   CORONARY ANGIOPLASTY WITH STENT PLACEMENT  2008   DILATION AND CURETTAGE OF UTERUS     ESOPHAGOGASTRODUODENOSCOPY (EGD) WITH PROPOFOL  N/A 03/09/2022   Procedure: ESOPHAGOGASTRODUODENOSCOPY (EGD) WITH PROPOFOL ;  Surgeon: Marnee Sink, MD;  Location: [facility];  Service: Endoscopy;  Laterality: N/A;   EYE SURGERY Bilateral    Cataract Extraction with IOL   FRACTURE SURGERY     HAND SURGERY Left 12/2017   upper hand fracture repair   LEFT HEART CATH AND CORONARY ANGIOGRAPHY N/A 06/29/2017   Procedure: LEFT HEART CATH AND CORONARY ANGIOGRAPHY;  Surgeon: Cherrie Cornwall, MD;  Location: [facility];  Service: Cardiovascular;  Laterality: N/A;   LEFT HEART CATH AND CORONARY ANGIOGRAPHY Left 12/02/2018   Procedure: LEFT HEART CATH AND CORONARY ANGIOGRAPHY;  Surgeon: Cherrie Cornwall, MD;  Location: [facility];  Service: Cardiovascular;  Laterality: Left;   OPEN REDUCTION INTERNAL FIXATION (ORIF) DISTAL RADIAL FRACTURE Right 08/12/2018   Procedure: OPEN REDUCTION INTERNAL FIXATION (ORIF) DISTAL RADIAL FRACTURE;  Surgeon: Marlynn Singer, MD;  Location: [facility];  Service: Orthopedics;  Laterality: Right;   POLYPECTOMY  11/20/2016   Procedure: POLYPECTOMY;  Surgeon: Marnee Sink, MD;  Location: [facility];  Service: Endoscopy;;   TUBAL LIGATION     VAGINAL HYSTERECTOMY     VULVECTOMY Right 01/31/2016   Procedure: WIDE EXCISION  VULVECTOMY-RIGHT LABIA;  Surgeon: Colan Dash, MD;  Location: [facility] OR[facility];  Service: Gynecology;  Laterality: Right;    SOCIAL HISTORY: Social History   Socioeconomic History   Marital status: Single    Spouse name: Not on file   Number of children: 5   Years of education: Not on file   Highest education level: Not on file  Occupational History   Occupation: part time job    Comment: Landscape architect  Tobacco Use   Smoking status: Every Day    Current packs/day: 1.00    Average packs/day: 1 pack/day for 35.0 years (35.0 ttl pk-yrs)    Types: Cigarettes   Smokeless tobacco: Current    Types: Snuff  Vaping Use   Vaping status: Never Used  Substance and Sexual Activity   Alcohol use: No    Alcohol/week: 0.0 standard drinks of alcohol   Drug use: No   Sexual activity: Not Currently    Partners: Male    Birth control/protection: Surgical  Other Topics Concern   Not on file  Social History Narrative   Not on file   Social Drivers of  Health   Financial Resource Strain: Low Risk  (12/02/2018)   Overall Financial Resource Strain (CARDIA)    Difficulty of Paying Living Expenses: Not very hard  Food Insecurity: No Food Insecurity (05/14/2023)   Hunger Vital Sign    Worried About Running Out of Food in the Last Year: Never true    Ran Out of Food in the Last Year: Never true  Transportation Needs: No Transportation Needs (05/14/2023)   PRAPARE - Administrator, Civil Service (Medical): No    Lack of Transportation (Non-Medical): No  Physical Activity: Inactive (05/14/2023)   Exercise Vital Sign    Days of Exercise per Week: 0 days    Minutes of Exercise per Session: 0 min  Stress: No Stress Concern Present (05/14/2023)   [employer] of Occupational Health - Occupational Stress Questionnaire    Feeling of Stress : Only a little  Social Connections: Unknown (12/02/2018)   Social Connection and Isolation Panel [NHANES]    Frequency of Communication with  Friends and Family: More than three times a week    Frequency of Social Gatherings with Friends and Family: Not on file    Attends Religious Services: Not on file    Active Member of Clubs or Organizations: Not on file    Attends Banker Meetings: Not on file    Marital Status: Not on file  Intimate Partner Violence: Not At Risk (05/14/2023)   Humiliation, Afraid, Rape, and Kick questionnaire    Fear of Current or Ex-Partner: No    Emotionally Abused: No    Physically Abused: No    Sexually Abused: No    FAMILY HISTORY: Family History  Problem Relation Age of Onset   Heart disease Father    Stroke Mother    Hypertension Mother    Lung cancer Brother    Heart attack Brother    Congestive Heart Failure Sister    Cervical cancer Sister    Cancer Neg Hx    Diabetes Neg Hx    Breast cancer Neg Hx     ALLERGIES:  is allergic to requip [ropinirole hcl], celebrex [celecoxib], meloxicam, nitrofurantoin, and ranexa [ranolazine].  MEDICATIONS:  Current Outpatient Medications  Medication Sig Dispense Refill   acetaminophen  (TYLENOL ) 500 MG tablet Take 1,000 mg by mouth every 6 (six) hours as needed for moderate pain (pain score 4-6) or fever.     albuterol  (VENTOLIN  HFA) 108 (90 Base) MCG/ACT inhaler INHALE 2 PUFFS BY MOUTH 4 TIMES A DAY AS NEEDED 8.5 each 3   amLODipine  (NORVASC ) 10 MG tablet Take 1 tablet (10 mg total) by mouth daily. 30 tablet 11   aspirin  EC 81 MG tablet Take 1 tablet (81 mg total) by mouth daily. 30 tablet    atorvastatin  (LIPITOR) 40 MG tablet TAKE 1 TABLET BY MOUTH EVERY DAY 90 tablet 3   clopidogrel  (PLAVIX ) 75 MG tablet TAKE 1 TABLET BY MOUTH EVERY DAY 90 tablet 1   DULoxetine  (CYMBALTA ) 60 MG capsule Take 1 capsule (60 mg total) by mouth daily. 90 capsule 1   isosorbide  mononitrate (IMDUR ) 120 MG 24 hr tablet TAKE 1 TABLET BY MOUTH EVERY DAY 90 tablet 3   lisinopril  (ZESTRIL ) 20 MG tablet TAKE 1 TABLET BY MOUTH EVERY DAY 90 tablet 1   meclizine   (ANTIVERT ) 25 MG tablet Take 1 tablet (25 mg total) by mouth 3 (three) times daily as needed for dizziness. 30 tablet 2   metoprolol  succinate (TOPROL -XL) 50 MG 24 hr tablet  TAKE 1 TABLET BY MOUTH EVERY DAY 90 tablet 3   nitroGLYCERIN  (NITROSTAT ) 0.4 MG SL tablet Place 1 tablet (0.4 mg total) under the tongue every 5 (five) minutes as needed for chest pain. 30 tablet 3   pantoprazole  (PROTONIX ) 40 MG tablet TAKE 1 TABLET BY MOUTH TWICE A DAY 180 tablet 1   traZODone  (DESYREL ) 50 MG tablet TAKE 1 TABLET BY MOUTH EVERYDAY AT BEDTIME 90 tablet 3   conjugated estrogens  (PREMARIN ) vaginal cream USE 1 GRAM INTRAVAGINALLY TWICE PER WEEK (Patient not taking: Reported on 12/11/2023) 30 g 3   No current facility-administered medications for this visit.     PHYSICAL EXAMINATION: ECOG PERFORMANCE STATUS: 1 - Symptomatic but completely ambulatory Vitals:   12/25/23 1337  BP: (!) 143/77  Pulse: 62  Resp: 18  Temp: (!) 96 F (35.6 C)   Filed Weights   12/25/23 1337  Weight: 146 lb 11.2 oz (66.5 kg)    Physical Exam Constitutional:      General: She is not in acute distress.    Appearance: She is not diaphoretic.     Comments: Patient walks with a cane.  HENT:     Head: Normocephalic.  Eyes:     General: No scleral icterus. Neck:     Vascular: No JVD.  Cardiovascular:     Rate and Rhythm: Normal rate.  Pulmonary:     Effort: Pulmonary effort is normal. No respiratory distress.  Abdominal:     General: Bowel sounds are normal. There is no distension.     Palpations: Abdomen is soft.  Musculoskeletal:        General: Normal range of motion.     Cervical back: Normal range of motion.  Skin:    General: Skin is warm and dry.  Neurological:     Mental Status: She is alert and oriented to person, place, and time. Mental status is at baseline.     Motor: No abnormal muscle tone.  Psychiatric:        Mood and Affect: Mood and affect normal.      LABORATORY DATA:  I have reviewed the  data as listed    Latest Ref Rng & Units 12/25/2023    1:13 PM 10/30/2023    1:47 PM 09/24/2023    1:34 PM  CBC  WBC 4.0 - 10.5 K/uL 83.0  65.0  68.8   Hemoglobin 12.0 - 15.0 g/dL 16.1  09.6  04.5   Hematocrit 36.0 - 46.0 % 39.1  36.9  37.2   Platelets 150 - 400 K/uL 219  191  196       Latest Ref Rng & Units 12/25/2023    1:13 PM 10/30/2023    1:46 PM 09/24/2023    1:34 PM  CMP  Glucose 70 - 99 mg/dL 90  80  95   BUN 8 - 23 mg/dL 13  11  8    Creatinine 0.44 - 1.00 mg/dL 4.09  8.11  9.14   Sodium 135 - 145 mmol/L 136  142  139   Potassium 3.5 - 5.1 mmol/L 3.7  3.7  3.9   Chloride 98 - 111 mmol/L 101  103  103   CO2 22 - 32 mmol/L 27  26  27    Calcium  8.9 - 10.3 mg/dL 9.8  78.2  9.6   Total Protein 6.5 - 8.1 g/dL 6.9  6.6  6.8   Total Bilirubin 0.0 - 1.2 mg/dL 0.5  0.3  0.7   Alkaline Phos  38 - 126 U/L 63  87  65   AST 15 - 41 U/L 13  12  13    ALT 0 - 44 U/L 11  10  13

## 2023-12-25 NOTE — Telephone Encounter (Signed)
 Received critical lab value from [place], White count 83. MD notified.

## 2023-12-25 NOTE — Assessment & Plan Note (Addendum)
 CLL  IgVH Somatic Hypermutation was not detected, 13Q del,  Progressive intra-abdominal lymphadenopathy, She is not complaint with her medication.  Previous CT showed some progression of her abdominopelvic adenopathy Patient previously has agreed with taking Zanubrutinib  however never started. She is interested in trying Zanubrutinib  I recommend her to follow up in 2-3 weeks

## 2023-12-25 NOTE — Assessment & Plan Note (Signed)
Encourage oral hydration. Avoid nephrotoxins. 

## 2023-12-26 ENCOUNTER — Other Ambulatory Visit: Payer: Self-pay | Admitting: Nephrology

## 2023-12-26 DIAGNOSIS — N1831 Chronic kidney disease, stage 3a: Secondary | ICD-10-CM

## 2023-12-26 DIAGNOSIS — R8290 Unspecified abnormal findings in urine: Secondary | ICD-10-CM

## 2023-12-31 DIAGNOSIS — D0439 Carcinoma in situ of skin of other parts of face: Secondary | ICD-10-CM | POA: Diagnosis not present

## 2024-01-02 ENCOUNTER — Ambulatory Visit
Admission: RE | Admit: 2024-01-02 | Discharge: 2024-01-02 | Disposition: A | Discharge status: Home / Self Care | Source: Ambulatory Visit | Attending: Nephrology | Admitting: Nephrology

## 2024-01-02 DIAGNOSIS — N281 Cyst of kidney, acquired: Secondary | ICD-10-CM | POA: Diagnosis not present

## 2024-01-02 DIAGNOSIS — N1831 Chronic kidney disease, stage 3a: Secondary | ICD-10-CM | POA: Diagnosis not present

## 2024-01-02 DIAGNOSIS — R8290 Unspecified abnormal findings in urine: Secondary | ICD-10-CM | POA: Diagnosis not present

## 2024-01-02 DIAGNOSIS — N1330 Unspecified hydronephrosis: Secondary | ICD-10-CM | POA: Diagnosis not present

## 2024-01-09 ENCOUNTER — Other Ambulatory Visit: Payer: Self-pay | Admitting: Internal Medicine

## 2024-01-09 ENCOUNTER — Other Ambulatory Visit: Payer: Self-pay | Admitting: Family

## 2024-01-11 ENCOUNTER — Telehealth: Payer: Self-pay | Admitting: Oncology

## 2024-01-11 NOTE — Telephone Encounter (Signed)
 Called pt and she states that she has not started Zanubrutinib  because she lost it. She has now found it and it is out of date. She is requesting a refill. Please advise.   Aslo appts have been r/s to 7/3, should appt be moved out further since she hasn't started?

## 2024-01-11 NOTE — Telephone Encounter (Signed)
 Pt called and r/s appts from 6/24 to 7/3.   Pt also requested that Dr.Yu's nurse call her. She did not state why. Please call the mobile number listed [phone] . Thank you

## 2024-01-14 ENCOUNTER — Other Ambulatory Visit: Payer: Self-pay

## 2024-01-14 ENCOUNTER — Other Ambulatory Visit (HOSPITAL_COMMUNITY): Payer: Self-pay

## 2024-01-14 ENCOUNTER — Telehealth: Payer: Self-pay | Admitting: Pharmacist

## 2024-01-14 ENCOUNTER — Telehealth: Payer: Self-pay | Admitting: Pharmacy Technician

## 2024-01-14 ENCOUNTER — Telehealth: Payer: Self-pay

## 2024-01-14 ENCOUNTER — Other Ambulatory Visit: Payer: Self-pay | Admitting: Pharmacy Technician

## 2024-01-14 DIAGNOSIS — C9110 Chronic lymphocytic leukemia of B-cell type not having achieved remission: Secondary | ICD-10-CM

## 2024-01-14 MED ORDER — ZANUBRUTINIB 80 MG PO CAPS
160.0000 mg | ORAL_CAPSULE | Freq: Two times a day (BID) | ORAL | 1 refills | Status: DC
  Filled 2024-01-14: qty 120, 30d supply, fill #0

## 2024-01-14 NOTE — Telephone Encounter (Signed)
 Clinical Pharmacist Practitioner Encounter   Patient Education I spoke with patient for overview of new oral chemotherapy medication: Brukinsa  (zanubrutinib ) for the treatment of CLL, planned duration until disease progression or unacceptable drug toxicity.   Counseled patient on administration, dosing, side effects, monitoring, drug-food interactions, safe handling, storage, and disposal. Patient will take 2 capsules (160 mg total) by mouth 2 (two) times daily.  Side effects include but not limited to: rash, fatigue, decreased wbc/plt/hgb.    Reviewed with patient importance of keeping a medication schedule and plan for any missed doses.  After discussion with patient no patient barriers to medication adherence identified.   Ms. Manago voiced understanding and appreciation. All questions answered. Medication handout provided.  Provided patient with Oral Chemotherapy Navigation Clinic phone number. Patient knows to call the office with questions or concerns. Oral Chemotherapy Navigation Clinic will continue to follow.  Jonathon Castelo N. Dearies Meikle, PharmD, BCOP, CPP Hematology/Oncology Clinical Pharmacist [facility] Oral Chemotherapy Navigation Clinic [phone]  01/14/2024 11:25 AM

## 2024-01-14 NOTE — Telephone Encounter (Signed)
 Clinical Pharmacist Practitioner Encounter   Received new prescription for Brukinsa  (zanubrutinib ) for the treatment of CLL, planned duration until disease progression or unacceptable drug toxicity.   Patient was originally going to start therapy in 2023, but ultimately never started.   CMP from 12/25/23 assessed, no relevant lab abnormalities. Prescription dose and frequency assessed.   Current medication list in Epic reviewed, a few DDIs with zanubrutinib  identified: Antiplatelet effects: Zanubrutinib  may increase antiplatelet effects of Therapeutic Antiplatelets such as, aspirin , clopidogrel , duloxetine , and trazodone . Increase monitoring for signs and symptoms of bleeding.   Evaluated chart and no patient barriers to medication adherence identified.   Prescription has been e-scribed to the [facility] for benefits analysis and approval.  Oral Oncology Clinic will continue to follow for insurance authorization, copayment issues, initial counseling and start date.   Karen [name] N. Cason Dabney, PharmD, BCOP, CPP Hematology/Oncology Clinical Pharmacist [facility] Oral Chemotherapy Navigation Clinic [phone]  01/14/2024 10:42 AM

## 2024-01-14 NOTE — Progress Notes (Signed)
 Patient education documented in EPIC note on 01/14/24.

## 2024-01-14 NOTE — Telephone Encounter (Signed)
 Called pt back and questions answered. Pt has r/s appt to 7/14 due to having other appts on the same day.

## 2024-01-14 NOTE — Telephone Encounter (Signed)
 Refill sent. Pt informed of refill and appt date/time reviewed. Appt reminder also mailed.

## 2024-01-14 NOTE — Telephone Encounter (Signed)
 Patient left voicemail stating she needs to speak to Dr.Yu's nurse. A previous call got disconnected. Please call her back at [phone]

## 2024-01-14 NOTE — Telephone Encounter (Signed)
 Patient successfully OnBoarded and drug education provided by pharmacist. Medication scheduled to be shipped on 06/24 for delivery on 06/25 from [facility] to patient's address. Patient also knows to call me at [phone] with any questions or concerns regarding receiving medication or if there is any unexpected change in co-pay.    Jacquan Savas (Patty) Chet Burnet, CPhT  [facility], [place], [place], [place] Oral Chemotherapy Patient Advocate Phone: [phone]  Fax: [phone]

## 2024-01-14 NOTE — Telephone Encounter (Signed)
 Oral Oncology Patient Advocate Encounter  After completing a benefits investigation, prior authorization for Brukinsa  is not required at this time through [employer].  Patient's copay is $0.     Lucielle Vokes (Patty) Chet Burnet, CPhT  [facility], [place], [place], [place] Oral Chemotherapy Patient Advocate Phone: [phone]  Fax: [phone]

## 2024-01-14 NOTE — Progress Notes (Signed)
 Specialty Pharmacy Initial Fill Coordination Note  Karen [name] is a 78 y.o. female contacted today regarding refills of specialty medication(s) Zanubrutinib  (BRUKINSA ) .  Patient requested Delivery  on 01/16/24  to verified address [address] [place] [address]   Medication will be filled on 06/24.   Patient is aware of $0 copayment.   Donato Studley (Patty) Chet Burnet, CPhT  [facility], [place], [place], [place] Oral Chemotherapy Patient Advocate Phone: [phone]  Fax: [phone]

## 2024-01-15 ENCOUNTER — Inpatient Hospital Stay: Admitting: Oncology

## 2024-01-15 ENCOUNTER — Other Ambulatory Visit: Payer: Self-pay

## 2024-01-15 ENCOUNTER — Inpatient Hospital Stay

## 2024-01-17 DIAGNOSIS — N1831 Chronic kidney disease, stage 3a: Secondary | ICD-10-CM | POA: Diagnosis not present

## 2024-01-17 DIAGNOSIS — E785 Hyperlipidemia, unspecified: Secondary | ICD-10-CM | POA: Diagnosis not present

## 2024-01-17 DIAGNOSIS — I10 Essential (primary) hypertension: Secondary | ICD-10-CM | POA: Diagnosis not present

## 2024-01-17 DIAGNOSIS — R809 Proteinuria, unspecified: Secondary | ICD-10-CM | POA: Diagnosis not present

## 2024-01-24 ENCOUNTER — Ambulatory Visit: Admitting: Oncology

## 2024-01-24 ENCOUNTER — Other Ambulatory Visit

## 2024-01-24 DIAGNOSIS — D0439 Carcinoma in situ of skin of other parts of face: Secondary | ICD-10-CM | POA: Diagnosis not present

## 2024-02-04 ENCOUNTER — Inpatient Hospital Stay: Admitting: Oncology

## 2024-02-04 ENCOUNTER — Inpatient Hospital Stay

## 2024-02-04 ENCOUNTER — Other Ambulatory Visit: Payer: Self-pay

## 2024-02-04 ENCOUNTER — Telehealth: Payer: Self-pay | Admitting: *Deleted

## 2024-02-04 NOTE — Telephone Encounter (Signed)
 Pt was scheduled to see MD, but called to r/s to 8/6

## 2024-02-04 NOTE — Telephone Encounter (Signed)
 The patient says that she does not want to feel any more giving her a lot of leg pain says on the left side it is bothering her on the ankles hurting and then on the right foot it is hurting on the top of her foot.  Cannot take the discomfort.  Did call her today to see if she would like to have a refill and she said no until she gets any information from the doctor about this side effects.  I told the patient that I am sending this information to Dr. Babara and the team so they can be able to help you.  He is okay with that

## 2024-02-11 ENCOUNTER — Ambulatory Visit: Admitting: Internal Medicine

## 2024-02-15 ENCOUNTER — Encounter: Payer: Self-pay | Admitting: Internal Medicine

## 2024-02-15 ENCOUNTER — Ambulatory Visit: Admitting: Internal Medicine

## 2024-02-15 VITALS — BP 128/72 | HR 70 | Ht 62.0 in | Wt 153.0 lb

## 2024-02-15 DIAGNOSIS — Z1211 Encounter for screening for malignant neoplasm of colon: Secondary | ICD-10-CM | POA: Diagnosis not present

## 2024-02-15 DIAGNOSIS — I5033 Acute on chronic diastolic (congestive) heart failure: Secondary | ICD-10-CM | POA: Diagnosis not present

## 2024-02-15 DIAGNOSIS — E782 Mixed hyperlipidemia: Secondary | ICD-10-CM | POA: Diagnosis not present

## 2024-02-15 DIAGNOSIS — F320 Major depressive disorder, single episode, mild: Secondary | ICD-10-CM | POA: Diagnosis not present

## 2024-02-15 DIAGNOSIS — I10 Essential (primary) hypertension: Secondary | ICD-10-CM

## 2024-02-15 MED ORDER — FUROSEMIDE 20 MG PO TABS: 20.0000 mg | ORAL_TABLET | ORAL | 11 refills | Status: AC | PRN

## 2024-02-15 NOTE — Progress Notes (Signed)
 Established Patient Office Visit  Subjective:  Patient ID: Karen [name], female    DOB: 16-Jul-1946  Age: 78 y.o. MRN: [id number]  Chief Complaint  Patient presents with   Follow-up    2 month follow up    Patient comes in for her follow-up today.  She missed her cardiac cath due to death in the family.  She has a follow-up appointment with cardiologist to reschedule it.  Still gets intermittent chest pain.  No complaints of shortness of breath or cough.  However she is grieving from the loss of her family members since last year.  Currently she is on Cymbalta  60 mg/day.  Will set up with a psychiatry referral. Patient also needs a follow-up colonoscopy for colon polyps.  Will send a referral to GI as well. Patient reports of mild right ankle edema.  There is no pain ,no tenderness and no bruising of the area.  No history of trauma or twisting her ankle.  There is no right calf pain, no swelling and no tenderness.  Will add a small dose of Lasix  to be taken once or twice a week. She is also currently on amlodipine  10 mg/day.  Will monitor her right ankle swelling and adjust medications if needed.    No other concerns at this time.   Past Medical History:  Diagnosis Date   Anxiety    Artery occlusion    Asthma    Back pain    Bronchitis    Cancer (HCC)    Skin Cancer   CLL (chronic lymphocytic leukemia) (HCC)    Coronary artery disease    Depression    Dyspnea    GERD (gastroesophageal reflux disease)    Herpes    History of kidney stones    Hypercholesteremia    Hyperlipemia    Hypertension    Hypothyroidism    Myocardial infarction (HCC) 1999   Neuropathy    Restless leg syndrome    Sleep apnea    use C-PAP   Wears dentures    full upper, partial lower.  doesn't currently wear    Past Surgical History:  Procedure Laterality Date   COLONOSCOPY WITH PROPOFOL  N/A 11/20/2016   Procedure: COLONOSCOPY WITH PROPOFOL ;  Surgeon: Rogelia Copping, MD;  Location: [facility];  Service: Endoscopy;  Laterality: N/A;  sleep apnea   CORONARY ANGIOPLASTY WITH STENT PLACEMENT  2008   DILATION AND CURETTAGE OF UTERUS     ESOPHAGOGASTRODUODENOSCOPY (EGD) WITH PROPOFOL  N/A 03/09/2022   Procedure: ESOPHAGOGASTRODUODENOSCOPY (EGD) WITH PROPOFOL ;  Surgeon: Copping Rogelia, MD;  Location: [facility] END[facility];  Service: Endoscopy;  Laterality: N/A;   EYE SURGERY Bilateral    Cataract Extraction with IOL   FRACTURE SURGERY     HAND SURGERY Left 12/2017   upper hand fracture repair   LEFT HEART CATH AND CORONARY ANGIOGRAPHY N/A 06/29/2017   Procedure: LEFT HEART CATH AND CORONARY ANGIOGRAPHY;  Surgeon: Fernand Denyse LABOR, MD;  Location: [facility];  Service: Cardiovascular;  Laterality: N/A;   LEFT HEART CATH AND CORONARY ANGIOGRAPHY Left 12/02/2018   Procedure: LEFT HEART CATH AND CORONARY ANGIOGRAPHY;  Surgeon: Fernand Denyse LABOR, MD;  Location: [facility];  Service: Cardiovascular;  Laterality: Left;   OPEN REDUCTION INTERNAL FIXATION (ORIF) DISTAL RADIAL FRACTURE Right 08/12/2018   Procedure: OPEN REDUCTION INTERNAL FIXATION (ORIF) DISTAL RADIAL FRACTURE;  Surgeon: Cleotilde Barrio, MD;  Location: [facility] ORS;  Service: Orthopedics;  Laterality: Right;   POLYPECTOMY  11/20/2016   Procedure:  POLYPECTOMY;  Surgeon: Rogelia Copping, MD;  Location: [facility];  Service: Endoscopy;;   TUBAL LIGATION     VAGINAL HYSTERECTOMY     VULVECTOMY Right 01/31/2016   Procedure: WIDE EXCISION VULVECTOMY-RIGHT LABIA;  Surgeon: Gladis DELENA Dollar, MD;  Location: [facility] ORS;  Service: Gynecology;  Laterality: Right;    Social History   Socioeconomic History   Marital status: Single    Spouse name: Not on file   Number of children: 5   Years of education: Not on file   Highest education level: Not on file  Occupational History   Occupation: part time job    Comment: [employer]  Tobacco Use   Smoking status: Every Day    Current packs/day: 1.00    Average packs/day: 1  pack/day for 35.0 years (35.0 ttl pk-yrs)    Types: Cigarettes   Smokeless tobacco: Current    Types: Snuff  Vaping Use   Vaping status: Never Used  Substance and Sexual Activity   Alcohol use: No    Alcohol/week: 0.0 standard drinks of alcohol   Drug use: No   Sexual activity: Not Currently    Partners: Male    Birth control/protection: Surgical  Other Topics Concern   Not on file  Social History Narrative   Not on file   Social Drivers of Health   Financial Resource Strain: Low Risk  (12/02/2018)   Overall Financial Resource Strain (CARDIA)    Difficulty of Paying Living Expenses: Not very hard  Food Insecurity: No Food Insecurity (05/14/2023)   Hunger Vital Sign    Worried About Running Out of Food in the Last Year: Never true    Ran Out of Food in the Last Year: Never true  Transportation Needs: No Transportation Needs (05/14/2023)   PRAPARE - Administrator, Civil Service (Medical): No    Lack of Transportation (Non-Medical): No  Physical Activity: Inactive (05/14/2023)   Exercise Vital Sign    Days of Exercise per Week: 0 days    Minutes of Exercise per Session: 0 min  Stress: No Stress Concern Present (05/14/2023)   [employer] of Occupational Health - Occupational Stress Questionnaire    Feeling of Stress : Only a little  Social Connections: Unknown (12/02/2018)   Social Connection and Isolation Panel    Frequency of Communication with Friends and Family: More than three times a week    Frequency of Social Gatherings with Friends and Family: Not on file    Attends Religious Services: Not on file    Active Member of Clubs or Organizations: Not on file    Attends Banker Meetings: Not on file    Marital Status: Not on file  Intimate Partner Violence: Not At Risk (05/14/2023)   Humiliation, Afraid, Rape, and Kick questionnaire    Fear of Current or Ex-Partner: No    Emotionally Abused: No    Physically Abused: No    Sexually Abused:  No    Family History  Problem Relation Age of Onset   Heart disease Father    Stroke Mother    Hypertension Mother    Lung cancer Brother    Heart attack Brother    Congestive Heart Failure Sister    Cervical cancer Sister    Cancer Neg Hx    Diabetes Neg Hx    Breast cancer Neg Hx     Allergies  Allergen Reactions   Requip [Ropinirole Hcl] Anaphylaxis    Throat closing  Celebrex [Celecoxib] Itching   Meloxicam Palpitations   Nitrofurantoin Palpitations   Ranexa [Ranolazine] Hives    Outpatient Medications Prior to Visit  Medication Sig   acetaminophen  (TYLENOL ) 500 MG tablet Take 1,000 mg by mouth every 6 (six) hours as needed for moderate pain (pain score 4-6) or fever.   albuterol  (VENTOLIN  HFA) 108 (90 Base) MCG/ACT inhaler INHALE 2 PUFFS BY MOUTH 4 TIMES A DAY AS NEEDED   amLODipine  (NORVASC ) 10 MG tablet Take 1 tablet (10 mg total) by mouth daily.   aspirin  EC 81 MG tablet Take 1 tablet (81 mg total) by mouth daily.   atorvastatin  (LIPITOR) 40 MG tablet TAKE 1 TABLET BY MOUTH EVERY DAY   clopidogrel  (PLAVIX ) 75 MG tablet TAKE 1 TABLET BY MOUTH EVERY DAY   DULoxetine  (CYMBALTA ) 60 MG capsule Take 1 capsule (60 mg total) by mouth daily.   isosorbide  mononitrate (IMDUR ) 120 MG 24 hr tablet TAKE 1 TABLET BY MOUTH EVERY DAY   lisinopril  (ZESTRIL ) 20 MG tablet TAKE 1 TABLET BY MOUTH EVERY DAY   meclizine  (ANTIVERT ) 25 MG tablet Take 1 tablet (25 mg total) by mouth 3 (three) times daily as needed for dizziness.   metoprolol  succinate (TOPROL -XL) 50 MG 24 hr tablet TAKE 1 TABLET BY MOUTH EVERY DAY   nitroGLYCERIN  (NITROSTAT ) 0.4 MG SL tablet Place 1 tablet (0.4 mg total) under the tongue every 5 (five) minutes as needed for chest pain.   pantoprazole  (PROTONIX ) 40 MG tablet TAKE 1 TABLET BY MOUTH TWICE A DAY   traZODone  (DESYREL ) 50 MG tablet TAKE 2 TABLETS BY MOUTH AT BEDTIME   zanubrutinib  (BRUKINSA ) 80 MG capsule Take 2 capsules (160 mg total) by mouth 2 (two) times  daily.   conjugated estrogens  (PREMARIN ) vaginal cream USE 1 GRAM INTRAVAGINALLY TWICE PER WEEK (Patient not taking: Reported on 02/15/2024)   No facility-administered medications prior to visit.    Review of Systems  Constitutional:  Positive for malaise/fatigue. Negative for chills, diaphoresis, fever and weight loss.  HENT: Negative.  Negative for congestion, sinus pain and sore throat.   Eyes: Negative.   Respiratory: Negative.  Negative for cough and shortness of breath.   Cardiovascular: Negative.  Negative for chest pain, palpitations and leg swelling.  Gastrointestinal: Negative.  Negative for abdominal pain, constipation, diarrhea, heartburn, nausea and vomiting.  Genitourinary: Negative.  Negative for dysuria and flank pain.  Musculoskeletal:  Positive for joint pain. Negative for myalgias.  Skin: Negative.   Neurological: Negative.  Negative for dizziness, tingling, tremors, sensory change and headaches.  Endo/Heme/Allergies: Negative.   Psychiatric/Behavioral:  Positive for depression. Negative for suicidal ideas. The patient is not nervous/anxious.        Objective:   BP 128/72   Pulse 70   Ht 5' 2 (1.575 m)   Wt 153 lb (69.4 kg)   SpO2 97%   BMI 27.98 kg/m   Vitals:   02/15/24 1150  BP: 128/72  Pulse: 70  Height: 5' 2 (1.575 m)  Weight: 153 lb (69.4 kg)  SpO2: 97%  BMI (Calculated): 27.98    Physical Exam Vitals and nursing note reviewed.  Constitutional:      Appearance: Normal appearance.  HENT:     Head: Normocephalic and atraumatic.     Nose: Nose normal.     Mouth/Throat:     Mouth: Mucous membranes are moist.     Pharynx: Oropharynx is clear.  Eyes:     Conjunctiva/sclera: Conjunctivae normal.     Pupils: Pupils  are equal, round, and reactive to light.  Cardiovascular:     Rate and Rhythm: Normal rate and regular rhythm.     Pulses: Normal pulses.     Heart sounds: Normal heart sounds. No murmur heard. Pulmonary:     Effort: Pulmonary  effort is normal.     Breath sounds: Normal breath sounds. No wheezing.  Abdominal:     General: Bowel sounds are normal.     Palpations: Abdomen is soft.     Tenderness: There is no abdominal tenderness. There is no right CVA tenderness or left CVA tenderness.  Musculoskeletal:        General: Swelling (right ankle) present. Normal range of motion.     Cervical back: Normal range of motion.     Right lower leg: No edema.     Left lower leg: No edema.  Skin:    General: Skin is warm and dry.  Neurological:     General: No focal deficit present.     Mental Status: She is alert and oriented to person, place, and time.  Psychiatric:        Mood and Affect: Mood normal.        Behavior: Behavior normal.      No results found for any visits on 02/15/24.  Recent Results (from the past 2160 hours)  CMP (Cancer Center only)     Status: Abnormal   Collection Time: 12/25/23  1:13 PM  Result Value Ref Range   Sodium 136 135 - 145 mmol/L   Potassium 3.7 3.5 - 5.1 mmol/L   Chloride 101 98 - 111 mmol/L   CO2 27 22 - 32 mmol/L   Glucose, Bld 90 70 - 99 mg/dL    Comment: Glucose reference range applies only to samples taken after fasting for at least 8 hours.   BUN 13 8 - 23 mg/dL   Creatinine 8.86 (H) 9.55 - 1.00 mg/dL   Calcium  9.8 8.9 - 10.3 mg/dL   Total Protein 6.9 6.5 - 8.1 g/dL   Albumin 4.3 3.5 - 5.0 g/dL   AST 13 (L) 15 - 41 U/L   ALT 11 0 - 44 U/L   Alkaline Phosphatase 63 38 - 126 U/L   Total Bilirubin 0.5 0.0 - 1.2 mg/dL   GFR, Estimated 49 (L) >60 mL/min    Comment: (NOTE) Calculated using the CKD-EPI Creatinine Equation (2021)    Anion gap 8 5 - 15    Comment: Performed at [facility], [address] [place], [place] [address]  CBC with Differential (Cancer Center Only)     Status: Abnormal   Collection Time: 12/25/23  1:13 PM  Result Value Ref Range   WBC Count 83.0 (HH) 4.0 - 10.5 K/uL    Comment: REPEATED TO VERIFY THIS CRITICAL RESULT HAS VERIFIED AND  BEEN CALLED TO ELIZABETH SANTOS BY KIM ROOS ON 06 03 2025 AT 1349, AND HAS BEEN READ BACK.     RBC 4.12 3.87 - 5.11 MIL/uL   Hemoglobin 12.3 12.0 - 15.0 g/dL   HCT 60.8 63.9 - 53.9 %   MCV 94.9 80.0 - 100.0 fL   MCH 29.9 26.0 - 34.0 pg   MCHC 31.5 30.0 - 36.0 g/dL   RDW 85.9 88.4 - 84.4 %   Platelet Count 219 150 - 400 K/uL   nRBC 0.0 0.0 - 0.2 %   Neutrophils Relative % 4 %   Neutro Abs 3.6 1.7 - 7.7 K/uL   Lymphocytes Relative  90 %   Lymphs Abs 74.8 (H) 0.7 - 4.0 K/uL   Monocytes Relative 5 %   Monocytes Absolute 4.0 (H) 0.1 - 1.0 K/uL   Eosinophils Relative 1 %   Eosinophils Absolute 0.4 0.0 - 0.5 K/uL   Basophils Relative 0 %   Basophils Absolute 0.1 0.0 - 0.1 K/uL   WBC Morphology      Lymphocytosis with morphology consistent with known diagnosis of CLL.   RBC Morphology UNREMARKABLE    Smear Review Normal platelet morphology     Comment: PLATELETS APPEAR ADEQUATE   Immature Granulocytes 0 %   Abs Immature Granulocytes 0.17 (H) 0.00 - 0.07 K/uL    Comment: Performed at [facility], [address] [place], [place] [address]  Lactate dehydrogenase     Status: None   Collection Time: 12/25/23  1:13 PM  Result Value Ref Range   LDH 142 98 - 192 U/L    Comment: Performed at [facility], [address] [place], [place] [address]      Assessment & Plan:  Patient advised to take 1 tablet of Lasix  once or twice a week.  Keep her legs elevated.  Monitor for swelling and come in sooner if it gets worse. Problem List Items Addressed This Visit     Depression   Relevant Orders   Ambulatory referral to Psychiatry   Essential hypertension, benign - Primary   Relevant Medications   furosemide  (LASIX ) 20 MG tablet   Mixed hyperlipidemia   Relevant Medications   furosemide  (LASIX ) 20 MG tablet   Other Visit Diagnoses       Colon cancer screening       Relevant Orders   Ambulatory referral to Gastroenterology     CHF (congestive heart failure), NYHA class  III, acute on chronic, diastolic (HCC)       Relevant Medications   furosemide  (LASIX ) 20 MG tablet       Return in about 1 month (around 03/17/2024).   Total time spent: 30 minutes  FERNAND FREDY RAMAN, MD  02/15/2024   This document may have been prepared by [facility] Voice Recognition software and as such may include unintentional dictation errors.

## 2024-02-18 ENCOUNTER — Telehealth: Payer: Self-pay

## 2024-02-18 NOTE — Telephone Encounter (Signed)
 Patient was due for 5 yr repeat in 2023... Due to patient's age, is it okay to schedule colonoscopy without being seen first?

## 2024-02-26 ENCOUNTER — Telehealth: Payer: Self-pay

## 2024-02-26 ENCOUNTER — Other Ambulatory Visit: Payer: Self-pay

## 2024-02-26 DIAGNOSIS — Z860100 Personal history of colon polyps, unspecified: Secondary | ICD-10-CM

## 2024-02-26 MED ORDER — GOLYTELY 236 G PO SOLR: 4000.0000 mL | Freq: Once | ORAL | 0 refills | Status: AC

## 2024-02-26 NOTE — Telephone Encounter (Signed)
 Gastroenterology Pre-Procedure Review  Request Date: 06/02/24 Requesting Physician: Dr. Jinny  PATIENT REVIEW QUESTIONS: The patient responded to the following health history questions as indicated:    1. Are you having any GI issues? Acid reflux 2. Do you have a personal history of Polyps? yes (last colonoscopy performed by Dr. Jinny 11/20/2016 recommended repeat in 5 years) 3. Do you have a family history of Colon Cancer or Polyps? no 4. Diabetes Mellitus? no 5. Joint replacements in the past 12 months?no 6. Major health problems in the past 3 months?no 7. Any artificial heart valves, MVP, or defibrillator? Cardiac issues present.  Clearance routed to dr kathern office    MEDICATIONS & ALLERGIES:    Patient reports the following regarding taking any anticoagulation/antiplatelet therapy:   Plavix , Coumadin, Eliquis, Xarelto , Lovenox , Pradaxa, Brilinta, or Effient? yes (plavix  advice requested from Dr. kathern office) Aspirin ? yes (81 mg daily)  Patient confirms/reports the following medications:  Current Outpatient Medications  Medication Sig Dispense Refill   acetaminophen  (TYLENOL ) 500 MG tablet Take 1,000 mg by mouth every 6 (six) hours as needed for moderate pain (pain score 4-6) or fever.     albuterol  (VENTOLIN  HFA) 108 (90 Base) MCG/ACT inhaler INHALE 2 PUFFS BY MOUTH 4 TIMES A DAY AS NEEDED 8.5 each 3   amLODipine  (NORVASC ) 10 MG tablet Take 1 tablet (10 mg total) by mouth daily. 30 tablet 11   aspirin  EC 81 MG tablet Take 1 tablet (81 mg total) by mouth daily. 30 tablet    atorvastatin  (LIPITOR) 40 MG tablet TAKE 1 TABLET BY MOUTH EVERY DAY 90 tablet 3   clopidogrel  (PLAVIX ) 75 MG tablet TAKE 1 TABLET BY MOUTH EVERY DAY 90 tablet 1   conjugated estrogens  (PREMARIN ) vaginal cream USE 1 GRAM INTRAVAGINALLY TWICE PER WEEK (Patient not taking: Reported on 02/15/2024) 30 g 3   DULoxetine  (CYMBALTA ) 60 MG capsule Take 1 capsule (60 mg total) by mouth daily. 90 capsule 1   furosemide   (LASIX ) 20 MG tablet Take 1 tablet (20 mg total) by mouth as needed. 30 tablet 11   isosorbide  mononitrate (IMDUR ) 120 MG 24 hr tablet TAKE 1 TABLET BY MOUTH EVERY DAY 90 tablet 3   lisinopril  (ZESTRIL ) 20 MG tablet TAKE 1 TABLET BY MOUTH EVERY DAY 90 tablet 1   meclizine  (ANTIVERT ) 25 MG tablet Take 1 tablet (25 mg total) by mouth 3 (three) times daily as needed for dizziness. 30 tablet 2   metoprolol  succinate (TOPROL -XL) 50 MG 24 hr tablet TAKE 1 TABLET BY MOUTH EVERY DAY 90 tablet 3   nitroGLYCERIN  (NITROSTAT ) 0.4 MG SL tablet Place 1 tablet (0.4 mg total) under the tongue every 5 (five) minutes as needed for chest pain. 30 tablet 3   pantoprazole  (PROTONIX ) 40 MG tablet TAKE 1 TABLET BY MOUTH TWICE A DAY 180 tablet 1   traZODone  (DESYREL ) 50 MG tablet TAKE 2 TABLETS BY MOUTH AT BEDTIME 180 tablet 1   zanubrutinib  (BRUKINSA ) 80 MG capsule Take 2 capsules (160 mg total) by mouth 2 (two) times daily. 120 capsule 1   No current facility-administered medications for this visit.    Patient confirms/reports the following allergies:  Allergies  Allergen Reactions   Requip [Ropinirole Hcl] Anaphylaxis    Throat closing   Celebrex [Celecoxib] Itching   Meloxicam Palpitations   Nitrofurantoin Palpitations   Ranexa [Ranolazine] Hives    No orders of the defined types were placed in this encounter.   AUTHORIZATION INFORMATION Primary Insurance: 1D#: Group #:  Secondary Insurance: 1D#: Group #:  SCHEDULE INFORMATION: Date: 06/02/24 Time: Location: [facility]

## 2024-02-27 ENCOUNTER — Other Ambulatory Visit

## 2024-02-27 ENCOUNTER — Ambulatory Visit: Admitting: Oncology

## 2024-02-27 ENCOUNTER — Other Ambulatory Visit: Payer: Self-pay

## 2024-03-10 ENCOUNTER — Telehealth: Payer: Self-pay

## 2024-03-10 NOTE — Telephone Encounter (Signed)
 Received Cardiac Clearance fro Dr.Khans office-patient is cleared to have procedure.

## 2024-03-11 ENCOUNTER — Telehealth: Payer: Self-pay

## 2024-03-11 NOTE — Telephone Encounter (Signed)
 Trudy Leeanna RAMAN, CMA    03/10/24 10:18 AM Note Received Cardiac Clearance fro Dr.Khans office-patient is cleared to have procedure.      Medication advice is still pending.  Clearance has been resent to Dr. Kathern office.  Thanks,  [place], CMA

## 2024-03-11 NOTE — Telephone Encounter (Signed)
 Received clearance to Hold Plavix  7 days prior and to restart 1 day restart. Patient notified.

## 2024-03-12 ENCOUNTER — Other Ambulatory Visit: Payer: Self-pay

## 2024-03-17 ENCOUNTER — Ambulatory Visit: Admitting: Cardiovascular Disease

## 2024-03-18 ENCOUNTER — Other Ambulatory Visit: Payer: Self-pay

## 2024-03-18 ENCOUNTER — Encounter: Payer: Self-pay | Admitting: Oncology

## 2024-03-18 ENCOUNTER — Inpatient Hospital Stay: Admitting: Oncology

## 2024-03-18 ENCOUNTER — Inpatient Hospital Stay: Attending: Oncology

## 2024-03-18 ENCOUNTER — Ambulatory Visit: Admitting: Internal Medicine

## 2024-03-19 ENCOUNTER — Other Ambulatory Visit: Payer: Self-pay

## 2024-03-19 NOTE — Telephone Encounter (Signed)
 Trudy Leeanna RAMAN, CMA    03/11/24  1:28 PM Note Received clearance to Hold Plavix  7 days prior and to restart 1 day restart. Patient notified.

## 2024-03-25 ENCOUNTER — Other Ambulatory Visit: Payer: Self-pay

## 2024-03-26 ENCOUNTER — Other Ambulatory Visit: Payer: Self-pay

## 2024-03-27 ENCOUNTER — Ambulatory Visit (INDEPENDENT_AMBULATORY_CARE_PROVIDER_SITE_OTHER): Admitting: Cardiovascular Disease

## 2024-03-27 ENCOUNTER — Encounter: Payer: Self-pay | Admitting: Cardiovascular Disease

## 2024-03-27 VITALS — BP 112/64 | HR 65 | Ht 62.0 in | Wt 155.2 lb

## 2024-03-27 DIAGNOSIS — I10 Essential (primary) hypertension: Secondary | ICD-10-CM | POA: Diagnosis not present

## 2024-03-27 DIAGNOSIS — R0789 Other chest pain: Secondary | ICD-10-CM | POA: Diagnosis not present

## 2024-03-27 DIAGNOSIS — I5033 Acute on chronic diastolic (congestive) heart failure: Secondary | ICD-10-CM | POA: Diagnosis not present

## 2024-03-27 DIAGNOSIS — I480 Paroxysmal atrial fibrillation: Secondary | ICD-10-CM

## 2024-03-27 DIAGNOSIS — Z720 Tobacco use: Secondary | ICD-10-CM

## 2024-03-27 DIAGNOSIS — R0602 Shortness of breath: Secondary | ICD-10-CM | POA: Diagnosis not present

## 2024-03-27 DIAGNOSIS — F17210 Nicotine dependence, cigarettes, uncomplicated: Secondary | ICD-10-CM

## 2024-03-27 NOTE — Progress Notes (Signed)
 Cardiology Office Note   Date:  03/27/2024   ID:  Karen [name], DOB 1946/06/13, MRN [id number]  PCP:  Fernand Fredy RAMAN, MD  Cardiologist:  Denyse Fernand, MD      History of Present Illness: Karen [name] is a 78 y.o. female who presents for  Chief Complaint  Patient presents with   Follow-up    3 month follow up    Has chest pains in left side, and SOB, and feels tired. Was suppose to have cath in 11-09-23 but son died and did not have it.      Past Medical History:  Diagnosis Date   Anxiety    Artery occlusion    Asthma    Back pain    Bronchitis    Cancer (HCC)    Skin Cancer   CLL (chronic lymphocytic leukemia) (HCC)    Coronary artery disease    Depression    Dyspnea    GERD (gastroesophageal reflux disease)    Herpes    History of kidney stones    Hypercholesteremia    Hyperlipemia    Hypertension    Hypothyroidism    Myocardial infarction (HCC) 1999   Neuropathy    Restless leg syndrome    Sleep apnea    use C-PAP   Wears dentures    full upper, partial lower.  doesn't currently wear     Past Surgical History:  Procedure Laterality Date   COLONOSCOPY WITH PROPOFOL  N/A 11/20/2016   Procedure: COLONOSCOPY WITH PROPOFOL ;  Surgeon: Rogelia Copping, MD;  Location: [facility];  Service: Endoscopy;  Laterality: N/A;  sleep apnea   CORONARY ANGIOPLASTY WITH STENT PLACEMENT  2008   DILATION AND CURETTAGE OF UTERUS     ESOPHAGOGASTRODUODENOSCOPY (EGD) WITH PROPOFOL  N/A 03/09/2022   Procedure: ESOPHAGOGASTRODUODENOSCOPY (EGD) WITH PROPOFOL ;  Surgeon: Copping Rogelia, MD;  Location: [facility];  Service: Endoscopy;  Laterality: N/A;   EYE SURGERY Bilateral    Cataract Extraction with IOL   FRACTURE SURGERY     HAND SURGERY Left 12/2017   upper hand fracture repair   LEFT HEART CATH AND CORONARY ANGIOGRAPHY N/A 06/29/2017   Procedure: LEFT HEART CATH AND CORONARY ANGIOGRAPHY;  Surgeon: Fernand Denyse LABOR, MD;  Location: [facility];  Service:  Cardiovascular;  Laterality: N/A;   LEFT HEART CATH AND CORONARY ANGIOGRAPHY Left 12/02/2018   Procedure: LEFT HEART CATH AND CORONARY ANGIOGRAPHY;  Surgeon: Fernand Denyse LABOR, MD;  Location: [facility];  Service: Cardiovascular;  Laterality: Left;   OPEN REDUCTION INTERNAL FIXATION (ORIF) DISTAL RADIAL FRACTURE Right 08/12/2018   Procedure: OPEN REDUCTION INTERNAL FIXATION (ORIF) DISTAL RADIAL FRACTURE;  Surgeon: Cleotilde Barrio, MD;  Location: [facility];  Service: Orthopedics;  Laterality: Right;   POLYPECTOMY  11/20/2016   Procedure: POLYPECTOMY;  Surgeon: Rogelia Copping, MD;  Location: [facility];  Service: Endoscopy;;   TUBAL LIGATION     VAGINAL HYSTERECTOMY     VULVECTOMY Right 01/31/2016   Procedure: WIDE EXCISION VULVECTOMY-RIGHT LABIA;  Surgeon: Gladis LABOR Dollar, MD;  Location: [facility];  Service: Gynecology;  Laterality: Right;     Current Outpatient Medications  Medication Sig Dispense Refill   acetaminophen  (TYLENOL ) 500 MG tablet Take 1,000 mg by mouth every 6 (six) hours as needed for moderate pain (pain score 4-6) or fever.     albuterol  (VENTOLIN  HFA) 108 (90 Base) MCG/ACT inhaler INHALE 2 PUFFS BY MOUTH 4 TIMES A DAY AS NEEDED 8.5 each 3   amLODipine  (NORVASC )  10 MG tablet Take 1 tablet (10 mg total) by mouth daily. 30 tablet 11   aspirin  EC 81 MG tablet Take 1 tablet (81 mg total) by mouth daily. 30 tablet    atorvastatin  (LIPITOR) 40 MG tablet TAKE 1 TABLET BY MOUTH EVERY DAY 90 tablet 3   clopidogrel  (PLAVIX ) 75 MG tablet TAKE 1 TABLET BY MOUTH EVERY DAY 90 tablet 1   DULoxetine  (CYMBALTA ) 60 MG capsule Take 1 capsule (60 mg total) by mouth daily. 90 capsule 1   furosemide  (LASIX ) 20 MG tablet Take 1 tablet (20 mg total) by mouth as needed. 30 tablet 11   isosorbide  mononitrate (IMDUR ) 120 MG 24 hr tablet TAKE 1 TABLET BY MOUTH EVERY DAY 90 tablet 3   lisinopril  (ZESTRIL ) 20 MG tablet TAKE 1 TABLET BY MOUTH EVERY DAY 90 tablet 1   meclizine  (ANTIVERT ) 25  MG tablet Take 1 tablet (25 mg total) by mouth 3 (three) times daily as needed for dizziness. 30 tablet 2   metoprolol  succinate (TOPROL -XL) 50 MG 24 hr tablet TAKE 1 TABLET BY MOUTH EVERY DAY 90 tablet 3   nitroGLYCERIN  (NITROSTAT ) 0.4 MG SL tablet Place 1 tablet (0.4 mg total) under the tongue every 5 (five) minutes as needed for chest pain. 30 tablet 3   pantoprazole  (PROTONIX ) 40 MG tablet TAKE 1 TABLET BY MOUTH TWICE A DAY 180 tablet 1   traZODone  (DESYREL ) 50 MG tablet TAKE 2 TABLETS BY MOUTH AT BEDTIME 180 tablet 1   No current facility-administered medications for this visit.    Allergies:   Requip [ropinirole hcl], Celebrex [celecoxib], Meloxicam, Nitrofurantoin, and Ranexa [ranolazine]    Social History:   reports that she has been smoking cigarettes. She has a 35 pack-year smoking history. Her smokeless tobacco use includes snuff. She reports that she does not drink alcohol and does not use drugs.   Family History:  family history includes Cervical cancer in her sister; Congestive Heart Failure in her sister; Heart attack in her brother; Heart disease in her father; Hypertension in her mother; Lung cancer in her brother; Stroke in her mother.    ROS:     Review of Systems  Constitutional: Negative.   HENT: Negative.    Eyes: Negative.   Respiratory: Negative.    Gastrointestinal: Negative.   Genitourinary: Negative.   Musculoskeletal: Negative.   Skin: Negative.   Neurological: Negative.   Endo/Heme/Allergies: Negative.   Psychiatric/Behavioral: Negative.    All other systems reviewed and are negative.     All other systems are reviewed and negative.    PHYSICAL EXAM: VS:  BP 112/64   Pulse 65   Ht 5' 2 (1.575 m)   Wt 155 lb 3.2 oz (70.4 kg)   SpO2 96%   BMI 28.39 kg/m  , BMI Body mass index is 28.39 kg/m. Last weight:  Wt Readings from Last 3 Encounters:  03/27/24 155 lb 3.2 oz (70.4 kg)  02/15/24 153 lb (69.4 kg)  12/25/23 146 lb 11.2 oz (66.5 kg)      Physical Exam Constitutional:      Appearance: Normal appearance.  Cardiovascular:     Rate and Rhythm: Normal rate and regular rhythm.     Heart sounds: Normal heart sounds.  Pulmonary:     Effort: Pulmonary effort is normal.     Breath sounds: Normal breath sounds.  Musculoskeletal:     Right lower leg: No edema.     Left lower leg: No edema.  Neurological:  Mental Status: She is alert.       EKG:   Recent Labs: 12/25/2023: ALT 11; BUN 13; Creatinine 1.13; Hemoglobin 12.3; Platelet Count 219; Potassium 3.7; Sodium 136    Lipid Panel    Component Value Date/Time   CHOL 122 10/30/2023 1337   TRIG 126 10/30/2023 1337   HDL 36 (L) 10/30/2023 1337   CHOLHDL 3.2 12/11/2022 1224   CHOLHDL 8.4 08/10/2020 0523   VLDL 17 08/10/2020 0523   LDLCALC 63 10/30/2023 1337      Other studies Reviewed: Additional studies/ records that were reviewed today include:  Review of the above records demonstrates:       No data to display            ASSESSMENT AND PLAN:    ICD-10-CM   1. Other chest pain  R07.89 PCV ECHOCARDIOGRAM COMPLETE    MYOCARDIAL PERFUSION IMAGING   Has recurrent chest pain and SOB, with 60 Mid RCA and 60% mid LAD on cath done 2020. crest elevated and would like to see ischaemia prior to cath.    2. Paroxysmal atrial fibrillation (HCC)  I48.0 PCV ECHOCARDIOGRAM COMPLETE    MYOCARDIAL PERFUSION IMAGING    3. Tobacco abuse  Z72.0 PCV ECHOCARDIOGRAM COMPLETE    MYOCARDIAL PERFUSION IMAGING    4. SOB (shortness of breath)  R06.02 PCV ECHOCARDIOGRAM COMPLETE    MYOCARDIAL PERFUSION IMAGING    5. CHF (congestive heart failure), NYHA class III, acute on chronic, diastolic (HCC)  I50.33 PCV ECHOCARDIOGRAM COMPLETE    MYOCARDIAL PERFUSION IMAGING    6. Essential hypertension, benign  I10 PCV ECHOCARDIOGRAM COMPLETE    MYOCARDIAL PERFUSION IMAGING       Problem List Items Addressed This Visit       Cardiovascular and Mediastinum   Paroxysmal  atrial fibrillation (HCC)   Relevant Orders   PCV ECHOCARDIOGRAM COMPLETE   MYOCARDIAL PERFUSION IMAGING   Essential hypertension, benign   Relevant Orders   PCV ECHOCARDIOGRAM COMPLETE   MYOCARDIAL PERFUSION IMAGING     Other   Tobacco abuse   Relevant Orders   PCV ECHOCARDIOGRAM COMPLETE   MYOCARDIAL PERFUSION IMAGING   Chest pain - Primary   Relevant Orders   PCV ECHOCARDIOGRAM COMPLETE   MYOCARDIAL PERFUSION IMAGING   Other Visit Diagnoses       SOB (shortness of breath)       Relevant Orders   PCV ECHOCARDIOGRAM COMPLETE   MYOCARDIAL PERFUSION IMAGING     CHF (congestive heart failure), NYHA class III, acute on chronic, diastolic (HCC)       Relevant Orders   PCV ECHOCARDIOGRAM COMPLETE   MYOCARDIAL PERFUSION IMAGING          Disposition:   Return in about 3 weeks (around 04/17/2024) for echo, stress test and f/u.    Total time spent: 35 minutes  Signed,  Denyse Bathe, MD  03/27/2024 1:54 PM    [facility]

## 2024-03-31 ENCOUNTER — Other Ambulatory Visit: Payer: Self-pay

## 2024-04-01 ENCOUNTER — Other Ambulatory Visit: Payer: Self-pay

## 2024-04-09 ENCOUNTER — Ambulatory Visit (INDEPENDENT_AMBULATORY_CARE_PROVIDER_SITE_OTHER): Admitting: Family

## 2024-04-09 DIAGNOSIS — J069 Acute upper respiratory infection, unspecified: Secondary | ICD-10-CM | POA: Diagnosis not present

## 2024-04-09 LAB — POC INFLUENZA A&B (BINAX/QUICKVUE)
Influenza A, POC: NEGATIVE
Influenza B, POC: NEGATIVE

## 2024-04-09 NOTE — Progress Notes (Signed)
   Subjective   CHIEF COMPLAINT  Flu Testing    REASON FOR VISIT  Flu testing for URI symptoms.        Objective   Results for orders placed or performed in visit on 04/09/24  POC Influenza A&B (Binax test)  Result Value Ref Range   Influenza A, POC Negative Negative   Influenza B, POC Negative Negative    Assessment & Plan  1. Acute upper respiratory infection (Primary) - POC Influenza A&B (Binax test) Patient will let us  know if she does not start to feel better within a few days.   Total time spent: 5 minutes  ALAN CHRISTELLA ARRANT, FNP 04/09/2024

## 2024-04-10 ENCOUNTER — Telehealth: Payer: Self-pay | Admitting: Oncology

## 2024-04-10 ENCOUNTER — Encounter: Payer: Self-pay | Admitting: Oncology

## 2024-04-10 ENCOUNTER — Ambulatory Visit: Payer: Self-pay

## 2024-04-10 NOTE — Telephone Encounter (Signed)
 Pt called and requested to r/s lab and MD appt from 8/26 - r/s appts w/pt - LH

## 2024-04-14 ENCOUNTER — Other Ambulatory Visit: Payer: Self-pay

## 2024-04-18 ENCOUNTER — Ambulatory Visit

## 2024-04-18 DIAGNOSIS — I371 Nonrheumatic pulmonary valve insufficiency: Secondary | ICD-10-CM

## 2024-04-18 DIAGNOSIS — I361 Nonrheumatic tricuspid (valve) insufficiency: Secondary | ICD-10-CM

## 2024-04-18 DIAGNOSIS — Z720 Tobacco use: Secondary | ICD-10-CM

## 2024-04-18 DIAGNOSIS — I10 Essential (primary) hypertension: Secondary | ICD-10-CM

## 2024-04-18 DIAGNOSIS — I5033 Acute on chronic diastolic (congestive) heart failure: Secondary | ICD-10-CM

## 2024-04-18 DIAGNOSIS — I340 Nonrheumatic mitral (valve) insufficiency: Secondary | ICD-10-CM

## 2024-04-18 DIAGNOSIS — R0789 Other chest pain: Secondary | ICD-10-CM

## 2024-04-18 DIAGNOSIS — I480 Paroxysmal atrial fibrillation: Secondary | ICD-10-CM

## 2024-04-18 DIAGNOSIS — R0602 Shortness of breath: Secondary | ICD-10-CM

## 2024-04-21 DIAGNOSIS — N2581 Secondary hyperparathyroidism of renal origin: Secondary | ICD-10-CM | POA: Diagnosis not present

## 2024-04-21 DIAGNOSIS — N1831 Chronic kidney disease, stage 3a: Secondary | ICD-10-CM | POA: Diagnosis not present

## 2024-04-21 DIAGNOSIS — E785 Hyperlipidemia, unspecified: Secondary | ICD-10-CM | POA: Diagnosis not present

## 2024-04-21 DIAGNOSIS — R809 Proteinuria, unspecified: Secondary | ICD-10-CM | POA: Diagnosis not present

## 2024-04-21 DIAGNOSIS — I10 Essential (primary) hypertension: Secondary | ICD-10-CM | POA: Diagnosis not present

## 2024-04-22 ENCOUNTER — Other Ambulatory Visit: Payer: Self-pay

## 2024-04-24 ENCOUNTER — Ambulatory Visit

## 2024-04-29 ENCOUNTER — Ambulatory Visit: Admitting: Cardiovascular Disease

## 2024-05-06 ENCOUNTER — Ambulatory Visit (HOSPITAL_COMMUNITY): Payer: Self-pay | Admitting: Psychiatry

## 2024-05-12 ENCOUNTER — Ambulatory Visit: Admitting: Oncology

## 2024-05-12 ENCOUNTER — Other Ambulatory Visit

## 2024-05-12 ENCOUNTER — Other Ambulatory Visit: Payer: Self-pay

## 2024-05-12 ENCOUNTER — Ambulatory Visit

## 2024-05-14 ENCOUNTER — Other Ambulatory Visit (HOSPITAL_COMMUNITY): Payer: Self-pay

## 2024-05-15 ENCOUNTER — Ambulatory Visit: Admitting: Cardiovascular Disease

## 2024-05-19 DIAGNOSIS — H90A31 Mixed conductive and sensorineural hearing loss, unilateral, right ear with restricted hearing on the contralateral side: Secondary | ICD-10-CM | POA: Diagnosis not present

## 2024-05-19 DIAGNOSIS — H90A21 Sensorineural hearing loss, unilateral, right ear, with restricted hearing on the contralateral side: Secondary | ICD-10-CM | POA: Diagnosis not present

## 2024-05-19 DIAGNOSIS — H6121 Impacted cerumen, right ear: Secondary | ICD-10-CM | POA: Diagnosis not present

## 2024-05-29 ENCOUNTER — Telehealth: Payer: Self-pay

## 2024-05-29 NOTE — Telephone Encounter (Signed)
 Per pt has to cancel procedure on 06-02-2024 due to pt daughter in hospital.

## 2024-06-02 ENCOUNTER — Ambulatory Visit: Admission: RE | Admit: 2024-06-02 | Source: Home / Self Care | Admitting: Gastroenterology

## 2024-06-02 SURGERY — COLONOSCOPY
Anesthesia: General

## 2024-06-10 ENCOUNTER — Other Ambulatory Visit: Payer: Self-pay | Admitting: Internal Medicine

## 2024-06-10 ENCOUNTER — Other Ambulatory Visit: Payer: Self-pay | Admitting: Cardiovascular Disease

## 2024-06-10 DIAGNOSIS — F411 Generalized anxiety disorder: Secondary | ICD-10-CM

## 2024-06-11 ENCOUNTER — Inpatient Hospital Stay: Attending: Oncology

## 2024-06-11 ENCOUNTER — Encounter: Payer: Self-pay | Admitting: Oncology

## 2024-06-11 ENCOUNTER — Telehealth: Payer: Self-pay

## 2024-06-11 ENCOUNTER — Inpatient Hospital Stay (HOSPITAL_BASED_OUTPATIENT_CLINIC_OR_DEPARTMENT_OTHER): Admitting: Oncology

## 2024-06-11 VITALS — BP 138/60 | HR 61 | Temp 97.6°F | Resp 18 | Wt 157.3 lb

## 2024-06-11 DIAGNOSIS — Z79899 Other long term (current) drug therapy: Secondary | ICD-10-CM | POA: Diagnosis not present

## 2024-06-11 DIAGNOSIS — F17220 Nicotine dependence, chewing tobacco, uncomplicated: Secondary | ICD-10-CM | POA: Diagnosis not present

## 2024-06-11 DIAGNOSIS — R61 Generalized hyperhidrosis: Secondary | ICD-10-CM | POA: Diagnosis not present

## 2024-06-11 DIAGNOSIS — N1831 Chronic kidney disease, stage 3a: Secondary | ICD-10-CM | POA: Diagnosis not present

## 2024-06-11 DIAGNOSIS — C9110 Chronic lymphocytic leukemia of B-cell type not having achieved remission: Secondary | ICD-10-CM | POA: Diagnosis present

## 2024-06-11 DIAGNOSIS — D649 Anemia, unspecified: Secondary | ICD-10-CM | POA: Insufficient documentation

## 2024-06-11 LAB — CBC WITH DIFFERENTIAL (CANCER CENTER ONLY)
Abs Immature Granulocytes: 0.16 K/uL — ABNORMAL HIGH (ref 0.00–0.07)
Basophils Absolute: 0.6 K/uL — ABNORMAL HIGH (ref 0.0–0.1)
Basophils Relative: 1 %
Eosinophils Absolute: 0.3 K/uL (ref 0.0–0.5)
Eosinophils Relative: 0 %
HCT: 34.1 % — ABNORMAL LOW (ref 36.0–46.0)
Hemoglobin: 10.8 g/dL — ABNORMAL LOW (ref 12.0–15.0)
Immature Granulocytes: 0 %
Lymphocytes Relative: 90 %
Lymphs Abs: 74.4 K/uL — ABNORMAL HIGH (ref 0.7–4.0)
MCH: 29.4 pg (ref 26.0–34.0)
MCHC: 31.7 g/dL (ref 30.0–36.0)
MCV: 92.9 fL (ref 80.0–100.0)
Monocytes Absolute: 5.3 K/uL — ABNORMAL HIGH (ref 0.1–1.0)
Monocytes Relative: 6 %
Neutro Abs: 2.6 K/uL (ref 1.7–7.7)
Neutrophils Relative %: 3 %
Platelet Count: 185 K/uL (ref 150–400)
RBC: 3.67 MIL/uL — ABNORMAL LOW (ref 3.87–5.11)
RDW: 13.8 % (ref 11.5–15.5)
Smear Review: NORMAL
WBC Count: 83.3 K/uL (ref 4.0–10.5)
nRBC: 0 % (ref 0.0–0.2)

## 2024-06-11 LAB — CMP (CANCER CENTER ONLY)
ALT: 14 U/L (ref 0–44)
AST: 16 U/L (ref 15–41)
Albumin: 4 g/dL (ref 3.5–5.0)
Alkaline Phosphatase: 62 U/L (ref 38–126)
Anion gap: 7 (ref 5–15)
BUN: 15 mg/dL (ref 8–23)
CO2: 24 mmol/L (ref 22–32)
Calcium: 9.5 mg/dL (ref 8.9–10.3)
Chloride: 100 mmol/L (ref 98–111)
Creatinine: 1.27 mg/dL — ABNORMAL HIGH (ref 0.44–1.00)
GFR, Estimated: 43 mL/min — ABNORMAL LOW (ref >60)
Glucose, Bld: 113 mg/dL — ABNORMAL HIGH (ref 70–99)
Potassium: 4.7 mmol/L (ref 3.5–5.1)
Sodium: 131 mmol/L — ABNORMAL LOW (ref 135–145)
Total Bilirubin: 0.6 mg/dL (ref 0.0–1.2)
Total Protein: 6.5 g/dL (ref 6.5–8.1)

## 2024-06-11 NOTE — Assessment & Plan Note (Signed)
 CLL  IgVH Somatic Hypermutation was not detected, 13Q del,  Progressive intra-abdominal lymphadenopathy, She is not complaint with her medication.  06/08/2023 CT showed some progression of her abdominopelvic adenopathy Patient previously has decided to resume Zanubrutinib  however never started. She has decided not to start on treatments.  Will continue watchful waiting.

## 2024-06-11 NOTE — Progress Notes (Signed)
 Hematology/Oncology Progress note [phone] [phone]        REASON FOR VISIT Follow up for treatment of CLL  ASSESSMENT & PLAN:   Cancer Staging  CLL (chronic lymphocytic leukemia) (HCC) Staging form: Chronic Lymphocytic Leukemia / Small Lymphocytic Lymphoma, AJCC 8th Edition - Clinical: Modified Rai Stage I (Modified Rai risk: Intermediate, Lymphocytosis: Present, Adenopathy: Present, Organomegaly: Absent, Anemia: Absent, Thrombocytopenia: Absent) - Signed by Babara Call, MD on 12/06/2021   CLL (chronic lymphocytic leukemia) (HCC) CLL  IgVH Somatic Hypermutation was not detected, 13Q del,  Progressive intra-abdominal lymphadenopathy, She is not complaint with her medication.  06/08/2023 CT showed some progression of her abdominopelvic adenopathy Patient previously has decided to resume Zanubrutinib  however never started. She has decided not to start on treatments.  Will continue watchful waiting.   CKD (chronic kidney disease) stage 3, GFR 30-59 ml/min (HCC) Encourage oral hydration.  Avoid nephrotoxins.  Normocytic anemia Multifactorial.  Likely secondary to CLL progression.  Could also be secondary to anemia due to chronic kidney disease. Monitor.   Orders Placed This Encounter  Procedures   CMP (Cancer Center only)    Standing Status:   Future    Expected Date:   12/09/2024    Expiration Date:   03/09/2025   CBC with Differential (Cancer Center Only)    Standing Status:   Future    Expected Date:   12/09/2024    Expiration Date:   03/09/2025   Lactate dehydrogenase    Standing Status:   Future    Expected Date:   12/09/2024    Expiration Date:   03/09/2025   Follow-up in 6 months.  Patient's preference  Call Babara, MD, PhD [facility] Hematology Oncology 06/11/2024       HISTORY OF PRESENTING ILLNESS:  Karen [name] is a  78 y.o.  female with PMH listed below who presents for follow-up of CLL treatments. Oncology history summary listed as  below. Oncology History  CLL (chronic lymphocytic leukemia) (HCC)  09/24/2015 Imaging   10/14/2015 chest without contrast Images were independently reviewed by me Small pulmonary nodules are stable over the past year.  Considered benign.  Several lymph nodes are borderline enlarged, including the lower right paratracheal lymph node and a portacaval lymph node. 08/27/2017 US  abdomen complete, no hepato-splenomegaly.   12/18/2017 Initial Diagnosis   CLL (chronic lymphocytic leukemia)   07/30/2017, peripheral blood flow cytometry showed CD5+, CD23+, CD38 negative monoclonal B-cell population consistent with a diagnosis of CLL   06/27/2019 Cancer Staging   Staging form: Chronic Lymphocytic Leukemia / Small Lymphocytic Lymphoma, AJCC 8th Edition - Clinical: Modified Rai Stage I (Modified Rai risk: Intermediate, Lymphocytosis: Present, Adenopathy: Present, Organomegaly: Absent, Anemia: Absent, Thrombocytopenia: Absent) - Signed by Babara Call, MD on 12/06/2021   11/27/2021 Imaging   Outside facility CT Aorta and run off w wo  showed moderate atherosclerotic aortic disease.  Diffuse lightest of moderate disease of the bilateral Udenyca vasculature.Diffuse mild stenotic disease of the bilateral SFAs. In addition, patient has soft tissue mass/lymph node along the left pelvic sidewall measures 4.9 x 4.7 cm, numerous peritoneum subcu tissue nodule, a deep right pelvic nodule and numerous enlarged mesenteric and retroperitoneal lymph nodes are identified.  For example a right mesenteric lymph node measures 2.4 x 2.2 cm.  The left periaortic lymph node measures 4.4 x 2.0 cm.  A pericardial lymph node measures 2.6 x 1.3 cm.   01/03/2022 Pathology Results   Right abdomen lymph node biopsy showed chronic lymphocytic leukemia/small  lymphocytic lymphoma    01/09/2022 - 02/06/2022 Chemotherapy   Acalabrutinib  100 mg twice daily  Stopped due to sensation of throat closing.   03/06/2022 -  Chemotherapy   Zanubrutinib , not  able to swallow due to esophagitis   03/09/2022 - 03/12/2022 Hospital Admission   Patient reports that she took 1 day of zanubrutinib  around 03/06/2022.  She has had difficulty swallowing the pills. She went to emergency room on 03/09/2022 for evaluation of dysphagia symptoms.  Symptoms occurs with both liquids and solids.  Also midsternal chest pain.  She was found to have hypertension emergency with systolic blood pressure greater than 200.  Patient states that she is being compliant with her blood pressure pills. Patient was treated with hydralazine , Cardizem drip.  Blood pressure improves. During the admission, she has had a EGD done by Dr. Jinny which showed esophagitis.  No biopsy was taken due to patient being on anticoagulation (aspirin , Plavix , low-dose Xarelto  2.5 mg twice daily] plan is for patient to follow-up with Dr. Aundria outpatient for repeat EGD while off anticoagulation.  Her PPI dose was increased. She was discharged on metoprolol , lisinopril , amlodipine  and nitrates   03/09/2022 Imaging   CT angio chest/abdomen/pelvis for dissection 1. No evidence for thoracic or abdominal aortic aneurysm or dissection. 2. Lymphadenopathy is identified in the chest, abdomen, and pelvis in this patient with reported history of CLL. Mediastinal lymphadenopathy is similar to prior chest CT of 2017. A hepatoduodenal ligament lymph node included on that prior chest CT has increased in size in the interval. Additional abdominopelvic lymphadenopathy was not included on available prior imaging. Close follow-up recommended and comparison to old studies suggested to assess for progression. 3. Left pelvic soft tissue mass measures 5.5 x 4.4 cm. Left gonadal veins appear to arise directly from this lesion suggesting ovarian etiology. Pelvic ultrasound may prove helpful to further evaluate. 4. Bilateral renal lesions with attenuation too high to be simple cysts. These may be cysts complicated by proteinaceous  debris or hemorrhage, but MRI abdomen with and without contrast recommended to confirm. 5. Diffuse colonic diverticulosis without diverticulitis.6. Stable appearance of small right-sided pulmonary nodules,consistent with benign etiology.7. Aortic Atherosclerosis (ICD10-I70.0).   03/09/2022 Imaging   Pelvic ultrasound showed 1. 5.1 cm solid soft tissue mass in the left adnexa likely representing left ovarian origin. Given that this patient has a reported history of CLL, there is likely prior imaging available outside are system. Consider comparison to previous outside studies to assess chronicity of this finding.2. No evidence for ovarian torsion.3. Status post hysterectomy.    03/09/22 EGD showed Non-severe non-erosive esophagitis with no bleeding INTERVAL HISTORY Karen [name] is a 78 y.o. female who has above history reviewed by me today presents for follow up visit for CLL She is accompanied by her friend Karen [name]. Patient was last seen in June 2025.  At that time, she was interested to reinitiate Zanubrutinib  treatments.  New supplies for sent to her.  She did not start.  She has been experiencing significant emotional distress following the recent passing of her oldest daughter which has impacted her ability to focus on her health management, including initiating her prescribed medication. Patient also rescheduled multiple times and sometimes no-showed to her follow-up appointments  Patient feels at baseline.  She has night sweats.  Weight has been stable. No fever, chills, fatigue, Chronic back and hip pain.     Review of Systems  Constitutional:  Positive for fatigue. Negative for appetite change, chills, fever and unexpected weight  change.  HENT:   Negative for hearing loss and voice change.   Eyes:  Negative for eye problems.  Respiratory:  Negative for chest tightness and cough.   Cardiovascular:  Negative for chest pain.  Gastrointestinal:  Negative for abdominal distention,  abdominal pain, blood in stool and nausea.  Endocrine: Negative for hot flashes.       Neck sweats  Genitourinary:  Negative for difficulty urinating and frequency.   Musculoskeletal:  Positive for back pain. Negative for arthralgias.  Skin:  Negative for itching and rash.  Neurological:  Negative for extremity weakness and headaches.  Hematological:  Negative for adenopathy.  Psychiatric/Behavioral:  Negative for confusion.      MEDICAL HISTORY:  Past Medical History:  Diagnosis Date   Anxiety    Artery occlusion    Asthma    Back pain    Bronchitis    Cancer (HCC)    Skin Cancer   CLL (chronic lymphocytic leukemia) (HCC)    Coronary artery disease    Depression    Dyspnea    GERD (gastroesophageal reflux disease)    Herpes    History of kidney stones    Hypercholesteremia    Hyperlipemia    Hypertension    Hypothyroidism    Myocardial infarction (HCC) 1999   Neuropathy    Restless leg syndrome    Sleep apnea    use C-PAP   Wears dentures    full upper, partial lower.  doesn't currently wear    SURGICAL HISTORY: Past Surgical History:  Procedure Laterality Date   COLONOSCOPY WITH PROPOFOL  N/A 11/20/2016   Procedure: COLONOSCOPY WITH PROPOFOL ;  Surgeon: Rogelia Copping, MD;  Location: [facility];  Service: Endoscopy;  Laterality: N/A;  sleep apnea   CORONARY ANGIOPLASTY WITH STENT PLACEMENT  2008   DILATION AND CURETTAGE OF UTERUS     ESOPHAGOGASTRODUODENOSCOPY (EGD) WITH PROPOFOL  N/A 03/09/2022   Procedure: ESOPHAGOGASTRODUODENOSCOPY (EGD) WITH PROPOFOL ;  Surgeon: Copping Rogelia, MD;  Location: [facility];  Service: Endoscopy;  Laterality: N/A;   EYE SURGERY Bilateral    Cataract Extraction with IOL   FRACTURE SURGERY     HAND SURGERY Left 12/2017   upper hand fracture repair   LEFT HEART CATH AND CORONARY ANGIOGRAPHY N/A 06/29/2017   Procedure: LEFT HEART CATH AND CORONARY ANGIOGRAPHY;  Surgeon: Fernand Denyse LABOR, MD;  Location: [facility];   Service: Cardiovascular;  Laterality: N/A;   LEFT HEART CATH AND CORONARY ANGIOGRAPHY Left 12/02/2018   Procedure: LEFT HEART CATH AND CORONARY ANGIOGRAPHY;  Surgeon: Fernand Denyse LABOR, MD;  Location: [facility];  Service: Cardiovascular;  Laterality: Left;   OPEN REDUCTION INTERNAL FIXATION (ORIF) DISTAL RADIAL FRACTURE Right 08/12/2018   Procedure: OPEN REDUCTION INTERNAL FIXATION (ORIF) DISTAL RADIAL FRACTURE;  Surgeon: Cleotilde Barrio, MD;  Location: [facility];  Service: Orthopedics;  Laterality: Right;   POLYPECTOMY  11/20/2016   Procedure: POLYPECTOMY;  Surgeon: Rogelia Copping, MD;  Location: [facility];  Service: Endoscopy;;   TUBAL LIGATION     VAGINAL HYSTERECTOMY     VULVECTOMY Right 01/31/2016   Procedure: WIDE EXCISION VULVECTOMY-RIGHT LABIA;  Surgeon: Gladis LABOR Dollar, MD;  Location: [facility];  Service: Gynecology;  Laterality: Right;    SOCIAL HISTORY: Social History   Socioeconomic History   Marital status: Single    Spouse name: Not on file   Number of children: 5   Years of education: Not on file   Highest education level: Not on file  Occupational History   Occupation: part time job    Comment: Landscape Architect  Tobacco Use   Smoking status: Every Day    Current packs/day: 1.00    Average packs/day: 1 pack/day for 35.0 years (35.0 ttl pk-yrs)    Types: Cigarettes   Smokeless tobacco: Current    Types: Snuff  Vaping Use   Vaping status: Never Used  Substance and Sexual Activity   Alcohol use: No    Alcohol/week: 0.0 standard drinks of alcohol   Drug use: No   Sexual activity: Not Currently    Partners: Male    Birth control/protection: Surgical  Other Topics Concern   Not on file  Social History Narrative   Not on file   Social Drivers of Health   Financial Resource Strain: Low Risk  (12/02/2018)   Overall Financial Resource Strain (CARDIA)    Difficulty of Paying Living Expenses: Not very hard  Food Insecurity: No Food Insecurity  (05/14/2023)   Hunger Vital Sign    Worried About Running Out of Food in the Last Year: Never true    Ran Out of Food in the Last Year: Never true  Transportation Needs: No Transportation Needs (05/14/2023)   PRAPARE - Administrator, Civil Service (Medical): No    Lack of Transportation (Non-Medical): No  Physical Activity: Inactive (05/14/2023)   Exercise Vital Sign    Days of Exercise per Week: 0 days    Minutes of Exercise per Session: 0 min  Stress: No Stress Concern Present (05/14/2023)   [employer] of Occupational Health - Occupational Stress Questionnaire    Feeling of Stress : Only a little  Social Connections: Unknown (12/02/2018)   Social Connection and Isolation Panel    Frequency of Communication with Friends and Family: More than three times a week    Frequency of Social Gatherings with Friends and Family: Not on file    Attends Religious Services: Not on file    Active Member of Clubs or Organizations: Not on file    Attends Banker Meetings: Not on file    Marital Status: Not on file  Intimate Partner Violence: Not At Risk (05/14/2023)   Humiliation, Afraid, Rape, and Kick questionnaire    Fear of Current or Ex-Partner: No    Emotionally Abused: No    Physically Abused: No    Sexually Abused: No    FAMILY HISTORY: Family History  Problem Relation Age of Onset   Heart disease Father    Stroke Mother    Hypertension Mother    Lung cancer Brother    Heart attack Brother    Congestive Heart Failure Sister    Cervical cancer Sister    Cancer Neg Hx    Diabetes Neg Hx    Breast cancer Neg Hx     ALLERGIES:  is allergic to requip [ropinirole hcl], celebrex [celecoxib], meloxicam, nitrofurantoin, and ranexa [ranolazine].  MEDICATIONS:  Current Outpatient Medications  Medication Sig Dispense Refill   acetaminophen  (TYLENOL ) 500 MG tablet Take 1,000 mg by mouth every 6 (six) hours as needed for moderate pain (pain score 4-6) or  fever.     albuterol  (VENTOLIN  HFA) 108 (90 Base) MCG/ACT inhaler INHALE 2 PUFFS BY MOUTH 4 TIMES A DAY AS NEEDED 8.5 each 3   amLODipine  (NORVASC ) 10 MG tablet Take 1 tablet (10 mg total) by mouth daily. 30 tablet 11   aspirin  EC 81 MG tablet Take 1 tablet (81 mg total) by mouth daily.  30 tablet    atorvastatin  (LIPITOR) 40 MG tablet TAKE 1 TABLET BY MOUTH EVERY DAY 90 tablet 3   clopidogrel  (PLAVIX ) 75 MG tablet TAKE 1 TABLET BY MOUTH EVERY DAY 90 tablet 1   DULoxetine  (CYMBALTA ) 60 MG capsule TAKE 1 CAPSULE BY MOUTH EVERY DAY 90 capsule 1   furosemide  (LASIX ) 20 MG tablet Take 1 tablet (20 mg total) by mouth as needed. 30 tablet 11   isosorbide  mononitrate (IMDUR ) 120 MG 24 hr tablet TAKE 1 TABLET BY MOUTH EVERY DAY 90 tablet 3   lisinopril  (ZESTRIL ) 20 MG tablet TAKE 1 TABLET BY MOUTH EVERY DAY 90 tablet 1   meclizine  (ANTIVERT ) 25 MG tablet Take 1 tablet (25 mg total) by mouth 3 (three) times daily as needed for dizziness. 30 tablet 2   metoprolol  succinate (TOPROL -XL) 50 MG 24 hr tablet TAKE 1 TABLET BY MOUTH EVERY DAY 90 tablet 3   nitroGLYCERIN  (NITROSTAT ) 0.4 MG SL tablet Place 1 tablet (0.4 mg total) under the tongue every 5 (five) minutes as needed for chest pain. 30 tablet 3   pantoprazole  (PROTONIX ) 40 MG tablet TAKE 1 TABLET BY MOUTH TWICE A DAY 180 tablet 1   traZODone  (DESYREL ) 50 MG tablet TAKE 2 TABLETS BY MOUTH AT BEDTIME 180 tablet 1   No current facility-administered medications for this visit.     PHYSICAL EXAMINATION: ECOG PERFORMANCE STATUS: 1 - Symptomatic but completely ambulatory Vitals:   06/11/24 1442  BP: 138/60  Pulse: 61  Resp: 18  Temp: 97.6 F (36.4 C)  SpO2: 95%   Filed Weights   06/11/24 1442  Weight: 157 lb 4.8 oz (71.4 kg)    Physical Exam Constitutional:      General: She is not in acute distress.    Appearance: She is not diaphoretic.     Comments: Patient walks with a cane.  Eyes:     General: No scleral icterus. Neck:     Vascular:  No JVD.  Cardiovascular:     Rate and Rhythm: Normal rate.  Pulmonary:     Effort: Pulmonary effort is normal. No respiratory distress.  Abdominal:     General: Bowel sounds are normal. There is no distension.     Palpations: Abdomen is soft.  Musculoskeletal:        General: Normal range of motion.     Cervical back: Normal range of motion.  Skin:    General: Skin is warm and dry.  Neurological:     Mental Status: She is alert and oriented to person, place, and time. Mental status is at baseline.     Motor: No abnormal muscle tone.  Psychiatric:        Mood and Affect: Mood and affect normal.      LABORATORY DATA:  I have reviewed the data as listed    Latest Ref Rng & Units 06/11/2024    2:24 PM 12/25/2023    1:13 PM 10/30/2023    1:47 PM  CBC  WBC 4.0 - 10.5 K/uL 83.3  83.0  65.0   Hemoglobin 12.0 - 15.0 g/dL 89.1  87.6  88.0   Hematocrit 36.0 - 46.0 % 34.1  39.1  36.9   Platelets 150 - 400 K/uL 185  219  191       Latest Ref Rng & Units 06/11/2024    2:24 PM 12/25/2023    1:13 PM 10/30/2023    1:46 PM  CMP  Glucose 70 - 99 mg/dL 886  90  80   BUN 8 - 23 mg/dL 15  13  11    Creatinine 0.44 - 1.00 mg/dL 8.72  8.86  8.98   Sodium 135 - 145 mmol/L 131  136  142   Potassium 3.5 - 5.1 mmol/L 4.7  3.7  3.7   Chloride 98 - 111 mmol/L 100  101  103   CO2 22 - 32 mmol/L 24  27  26    Calcium  8.9 - 10.3 mg/dL 9.5  9.8  89.7   Total Protein 6.5 - 8.1 g/dL 6.5  6.9  6.6   Total Bilirubin 0.0 - 1.2 mg/dL 0.6  0.5  0.3   Alkaline Phos 38 - 126 U/L 62  63  87   AST 15 - 41 U/L 16  13  12    ALT 0 - 44 U/L 14  11  10

## 2024-06-11 NOTE — Assessment & Plan Note (Signed)
Encourage oral hydration. Avoid nephrotoxins. 

## 2024-06-11 NOTE — Telephone Encounter (Signed)
 Received critical lab from [facility]. Critical lab: white count 83.2. MD notified and will see pt today.

## 2024-06-11 NOTE — Assessment & Plan Note (Signed)
 Multifactorial.  Likely secondary to CLL progression.  Could also be secondary to anemia due to chronic kidney disease. Monitor.

## 2024-06-13 ENCOUNTER — Ambulatory Visit: Admitting: Internal Medicine

## 2024-06-16 ENCOUNTER — Ambulatory Visit

## 2024-06-23 ENCOUNTER — Ambulatory Visit: Admitting: Cardiovascular Disease

## 2024-07-06 ENCOUNTER — Other Ambulatory Visit: Payer: Self-pay | Admitting: Internal Medicine

## 2024-07-09 ENCOUNTER — Other Ambulatory Visit: Payer: Self-pay | Admitting: Internal Medicine

## 2024-07-09 DIAGNOSIS — I480 Paroxysmal atrial fibrillation: Secondary | ICD-10-CM

## 2024-07-22 ENCOUNTER — Encounter: Payer: Self-pay | Admitting: Internal Medicine

## 2024-07-22 ENCOUNTER — Ambulatory Visit: Admitting: Internal Medicine

## 2024-07-22 VITALS — BP 122/72 | HR 59 | Ht 62.0 in | Wt 156.8 lb

## 2024-07-22 DIAGNOSIS — G4733 Obstructive sleep apnea (adult) (pediatric): Secondary | ICD-10-CM

## 2024-07-22 DIAGNOSIS — Z0130 Encounter for examination of blood pressure without abnormal findings: Secondary | ICD-10-CM

## 2024-07-22 DIAGNOSIS — G2581 Restless legs syndrome: Secondary | ICD-10-CM | POA: Diagnosis not present

## 2024-07-22 DIAGNOSIS — E782 Mixed hyperlipidemia: Secondary | ICD-10-CM

## 2024-07-22 DIAGNOSIS — J441 Chronic obstructive pulmonary disease with (acute) exacerbation: Secondary | ICD-10-CM | POA: Diagnosis not present

## 2024-07-22 DIAGNOSIS — J438 Other emphysema: Secondary | ICD-10-CM

## 2024-07-22 DIAGNOSIS — F320 Major depressive disorder, single episode, mild: Secondary | ICD-10-CM

## 2024-07-22 DIAGNOSIS — F17200 Nicotine dependence, unspecified, uncomplicated: Secondary | ICD-10-CM

## 2024-07-22 DIAGNOSIS — I480 Paroxysmal atrial fibrillation: Secondary | ICD-10-CM | POA: Diagnosis not present

## 2024-07-22 DIAGNOSIS — F17210 Nicotine dependence, cigarettes, uncomplicated: Secondary | ICD-10-CM

## 2024-07-22 DIAGNOSIS — C9110 Chronic lymphocytic leukemia of B-cell type not having achieved remission: Secondary | ICD-10-CM | POA: Diagnosis not present

## 2024-07-22 DIAGNOSIS — F411 Generalized anxiety disorder: Secondary | ICD-10-CM | POA: Diagnosis not present

## 2024-07-22 MED ORDER — GABAPENTIN 100 MG PO CAPS: 100.0000 mg | ORAL_CAPSULE | Freq: Every day | ORAL | 2 refills | Status: AC

## 2024-07-22 MED ORDER — BREZTRI AEROSPHERE 160-9-4.8 MCG/ACT IN AERO: 2.0000 | INHALATION_SPRAY | Freq: Two times a day (BID) | RESPIRATORY_TRACT | 3 refills | Status: DC

## 2024-07-22 MED ORDER — PREDNISONE 20 MG PO TABS: 40.0000 mg | ORAL_TABLET | Freq: Every day | ORAL | 0 refills | Status: AC

## 2024-07-22 MED ORDER — AZITHROMYCIN 250 MG PO TABS: ORAL_TABLET | ORAL | 0 refills | Status: AC

## 2024-07-22 NOTE — Progress Notes (Signed)
 "  Established Patient Office Visit  Subjective:  Patient ID: Karen [name], female    DOB: 26-Feb-1946  Age: 78 y.o. MRN: [id number]  Chief Complaint  Patient presents with   Nasal Congestion   Cough    Patient comes in with few days history of chest tightness and congestion.  Mentions mild shortness of breath and wheezing.  Denies any fevers or chills.  She is not coughing up any yellow-green mucus.  Patient continues to smoke and has history of emphysema.  She does get COPD exacerbations. She has history of restless leg syndrome which are getting worse.  Previously tried on Requip but was unable to tolerate.  Will add a small dose of gabapentin . She is due for her low-dose CT chest as well.    No other concerns at this time.   Past Medical History:  Diagnosis Date   Anxiety    Artery occlusion    Asthma    Back pain    Bronchitis    Cancer (HCC)    Skin Cancer   CLL (chronic lymphocytic leukemia) (HCC)    Coronary artery disease    Depression    Dyspnea    GERD (gastroesophageal reflux disease)    Herpes    History of kidney stones    Hypercholesteremia    Hyperlipemia    Hypertension    Hypothyroidism    Myocardial infarction (HCC) 1999   Neuropathy    Restless leg syndrome    Sleep apnea    use C-PAP   Wears dentures    full upper, partial lower.  doesn't currently wear    Past Surgical History:  Procedure Laterality Date   COLONOSCOPY WITH PROPOFOL  N/A 11/20/2016   Procedure: COLONOSCOPY WITH PROPOFOL ;  Surgeon: Rogelia Copping, MD;  Location: [facility];  Service: Endoscopy;  Laterality: N/A;  sleep apnea   CORONARY ANGIOPLASTY WITH STENT PLACEMENT  2008   DILATION AND CURETTAGE OF UTERUS     ESOPHAGOGASTRODUODENOSCOPY (EGD) WITH PROPOFOL  N/A 03/09/2022   Procedure: ESOPHAGOGASTRODUODENOSCOPY (EGD) WITH PROPOFOL ;  Surgeon: Copping Rogelia, MD;  Location: [facility];  Service: Endoscopy;  Laterality: N/A;   EYE SURGERY Bilateral    Cataract  Extraction with IOL   FRACTURE SURGERY     HAND SURGERY Left 12/2017   upper hand fracture repair   LEFT HEART CATH AND CORONARY ANGIOGRAPHY N/A 06/29/2017   Procedure: LEFT HEART CATH AND CORONARY ANGIOGRAPHY;  Surgeon: Fernand Denyse LABOR, MD;  Location: [facility];  Service: Cardiovascular;  Laterality: N/A;   LEFT HEART CATH AND CORONARY ANGIOGRAPHY Left 12/02/2018   Procedure: LEFT HEART CATH AND CORONARY ANGIOGRAPHY;  Surgeon: Fernand Denyse LABOR, MD;  Location: [facility];  Service: Cardiovascular;  Laterality: Left;   OPEN REDUCTION INTERNAL FIXATION (ORIF) DISTAL RADIAL FRACTURE Right 08/12/2018   Procedure: OPEN REDUCTION INTERNAL FIXATION (ORIF) DISTAL RADIAL FRACTURE;  Surgeon: Cleotilde Barrio, MD;  Location: [facility];  Service: Orthopedics;  Laterality: Right;   POLYPECTOMY  11/20/2016   Procedure: POLYPECTOMY;  Surgeon: Rogelia Copping, MD;  Location: [facility];  Service: Endoscopy;;   TUBAL LIGATION     VAGINAL HYSTERECTOMY     VULVECTOMY Right 01/31/2016   Procedure: WIDE EXCISION VULVECTOMY-RIGHT LABIA;  Surgeon: Gladis LABOR Dollar, MD;  Location: [facility];  Service: Gynecology;  Laterality: Right;    Social History   Socioeconomic History   Marital status: Single    Spouse name: Not on file   Number of children: 5  Years of education: Not on file   Highest education level: Not on file  Occupational History   Occupation: part time job    Comment: [employer]  Tobacco Use   Smoking status: Every Day    Current packs/day: 1.00    Average packs/day: 1 pack/day for 35.0 years (35.0 ttl pk-yrs)    Types: Cigarettes   Smokeless tobacco: Current    Types: Snuff  Vaping Use   Vaping status: Never Used  Substance and Sexual Activity   Alcohol use: No    Alcohol/week: 0.0 standard drinks of alcohol   Drug use: No   Sexual activity: Not Currently    Partners: Male    Birth control/protection: Surgical  Other Topics Concern   Not on file  Social  History Narrative   Not on file   Social Drivers of Health   Tobacco Use: High Risk (07/22/2024)   Patient History    Smoking Tobacco Use: Every Day    Smokeless Tobacco Use: Current    Passive Exposure: Not on file  Financial Resource Strain: Not on file  Food Insecurity: No Food Insecurity (05/14/2023)   Hunger Vital Sign    Worried About Running Out of Food in the Last Year: Never true    Ran Out of Food in the Last Year: Never true  Transportation Needs: No Transportation Needs (05/14/2023)   PRAPARE - Administrator, Civil Service (Medical): No    Lack of Transportation (Non-Medical): No  Physical Activity: Inactive (05/14/2023)   Exercise Vital Sign    Days of Exercise per Week: 0 days    Minutes of Exercise per Session: 0 min  Stress: No Stress Concern Present (05/14/2023)   [employer] of Occupational Health - Occupational Stress Questionnaire    Feeling of Stress : Only a little  Social Connections: Not on file  Intimate Partner Violence: Not At Risk (05/14/2023)   Humiliation, Afraid, Rape, and Kick questionnaire    Fear of Current or Ex-Partner: No    Emotionally Abused: No    Physically Abused: No    Sexually Abused: No  Depression (PHQ2-9): Medium Risk (02/15/2024)   Depression (PHQ2-9)    PHQ-2 Score: 8  Alcohol Screen: Low Risk (05/14/2023)   Alcohol Screen    Last Alcohol Screening Score (AUDIT): 1  Housing: Low Risk (05/14/2023)   Housing    Last Housing Risk Score: 0  Utilities: Not At Risk (05/14/2023)   AHC Utilities    Threatened with loss of utilities: No  Health Literacy: Not on file    Family History  Problem Relation Age of Onset   Heart disease Father    Stroke Mother    Hypertension Mother    Lung cancer Brother    Heart attack Brother    Congestive Heart Failure Sister    Cervical cancer Sister    Cancer Neg Hx    Diabetes Neg Hx    Breast cancer Neg Hx     Allergies[1]  Show/hide medication list[2]  Review  of Systems  Constitutional:  Positive for malaise/fatigue. Negative for chills and fever.  HENT: Negative.  Negative for congestion and sore throat.   Eyes: Negative.  Negative for blurred vision and pain.  Respiratory:  Positive for cough, shortness of breath and wheezing.   Cardiovascular: Negative.  Negative for chest pain, palpitations and leg swelling.  Gastrointestinal: Negative.  Negative for abdominal pain, blood in stool, constipation, diarrhea, heartburn, melena, nausea and vomiting.  Genitourinary: Negative.  Negative for dysuria, flank pain, frequency and urgency.  Musculoskeletal: Negative.  Negative for joint pain and myalgias.  Skin: Negative.   Neurological: Negative.  Negative for dizziness, tingling, sensory change, weakness and headaches.  Endo/Heme/Allergies: Negative.   Psychiatric/Behavioral: Negative.  Negative for depression and suicidal ideas. The patient is not nervous/anxious.        Objective:   BP 122/72   Pulse (!) 59   Ht 5' 2 (1.575 m)   Wt 156 lb 12.8 oz (71.1 kg)   SpO2 94%   BMI 28.68 kg/m   Vitals:   07/22/24 1344  BP: 122/72  Pulse: (!) 59  Height: 5' 2 (1.575 m)  Weight: 156 lb 12.8 oz (71.1 kg)  SpO2: 94%  BMI (Calculated): 28.67    Physical Exam Vitals and nursing note reviewed.  Constitutional:      Appearance: Normal appearance.  HENT:     Head: Normocephalic and atraumatic.     Nose: Nose normal.     Mouth/Throat:     Mouth: Mucous membranes are moist.     Pharynx: Oropharynx is clear.  Eyes:     Conjunctiva/sclera: Conjunctivae normal.     Pupils: Pupils are equal, round, and reactive to light.  Cardiovascular:     Rate and Rhythm: Normal rate and regular rhythm.     Pulses: Normal pulses.     Heart sounds: Normal heart sounds. No murmur heard. Pulmonary:     Effort: Pulmonary effort is normal.     Breath sounds: Wheezing present.  Abdominal:     General: Bowel sounds are normal.     Palpations: Abdomen is soft.      Tenderness: There is no abdominal tenderness. There is no right CVA tenderness or left CVA tenderness.  Musculoskeletal:        General: Normal range of motion.     Cervical back: Normal range of motion.     Right lower leg: No edema.     Left lower leg: No edema.  Skin:    General: Skin is warm and dry.  Neurological:     General: No focal deficit present.     Mental Status: She is alert and oriented to person, place, and time.  Psychiatric:        Mood and Affect: Mood normal.        Behavior: Behavior normal.      No results found for any visits on 07/22/24.  Recent Results (from the past 2160 hours)  CBC with Differential (Cancer Center Only)     Status: Abnormal   Collection Time: 06/11/24  2:24 PM  Result Value Ref Range   WBC Count 83.3 (HH) 4.0 - 10.5 K/uL    Comment: REPEATED TO VERIFY This critical result has been called to Almarie Nett by Luke Horn on 06/11/2024 14:55:36, and has been read back.    RBC 3.67 (L) 3.87 - 5.11 MIL/uL   Hemoglobin 10.8 (L) 12.0 - 15.0 g/dL   HCT 65.8 (L) 63.9 - 53.9 %   MCV 92.9 80.0 - 100.0 fL   MCH 29.4 26.0 - 34.0 pg   MCHC 31.7 30.0 - 36.0 g/dL   RDW 86.1 88.4 - 84.4 %   Platelet Count 185 150 - 400 K/uL   nRBC 0.0 0.0 - 0.2 %   Neutrophils Relative % 3 %   Neutro Abs 2.6 1.7 - 7.7 K/uL   Lymphocytes Relative 90 %   Lymphs Abs 74.4 (H) 0.7 - 4.0 K/uL  Monocytes Relative 6 %   Monocytes Absolute 5.3 (H) 0.1 - 1.0 K/uL   Eosinophils Relative 0 %   Eosinophils Absolute 0.3 0.0 - 0.5 K/uL   Basophils Relative 1 %   Basophils Absolute 0.6 (H) 0.0 - 0.1 K/uL   WBC Morphology See Note     Comment: Lymphocytosis with morphology consistent with known diagnosis of CLL.   RBC Morphology MORPHOLOGY UNREMARKABLE    Smear Review Normal platelet morphology    Immature Granulocytes 0 %   Abs Immature Granulocytes 0.16 (H) 0.00 - 0.07 K/uL    Comment: Performed at [facility], [address] [place], [place] [address]   CMP (Cancer Center only)     Status: Abnormal   Collection Time: 06/11/24  2:24 PM  Result Value Ref Range   Sodium 131 (L) 135 - 145 mmol/L   Potassium 4.7 3.5 - 5.1 mmol/L   Chloride 100 98 - 111 mmol/L   CO2 24 22 - 32 mmol/L   Glucose, Bld 113 (H) 70 - 99 mg/dL    Comment: Glucose reference range applies only to samples taken after fasting for at least 8 hours.   BUN 15 8 - 23 mg/dL   Creatinine 8.72 (H) 9.55 - 1.00 mg/dL   Calcium  9.5 8.9 - 10.3 mg/dL   Total Protein 6.5 6.5 - 8.1 g/dL   Albumin 4.0 3.5 - 5.0 g/dL   AST 16 15 - 41 U/L   ALT 14 0 - 44 U/L   Alkaline Phosphatase 62 38 - 126 U/L   Total Bilirubin 0.6 0.0 - 1.2 mg/dL   GFR, Estimated 43 (L) >60 mL/min    Comment: (NOTE) Calculated using the CKD-EPI Creatinine Equation (2021)    Anion gap 7 5 - 15    Comment: Performed at [facility], [address] [place], [place] [address]      Assessment & Plan:  Start treatment with prednisone  burst, Z-Pak.  Add Breztri inhaler.  Needs CT chest. Problem List Items Addressed This Visit       Cardiovascular and Mediastinum   Paroxysmal atrial fibrillation (HCC)     Respiratory   OSA on CPAP   Chronic obstructive pulmonary disease (HCC)   Relevant Medications   predniSONE  (DELTASONE ) 20 MG tablet   azithromycin  (ZITHROMAX  Z-PAK) 250 MG tablet   budesonide-glycopyrrolate -formoterol  (BREZTRI AEROSPHERE) 160-9-4.8 MCG/ACT AERO inhaler     Other   Mixed hyperlipidemia   GAD (generalized anxiety disorder)   Restless legs syndrome   Relevant Medications   gabapentin  (NEURONTIN ) 100 MG capsule   Other Visit Diagnoses       COPD with acute exacerbation (HCC)    -  Primary   Relevant Medications   predniSONE  (DELTASONE ) 20 MG tablet   azithromycin  (ZITHROMAX  Z-PAK) 250 MG tablet   budesonide-glycopyrrolate -formoterol  (BREZTRI AEROSPHERE) 160-9-4.8 MCG/ACT AERO inhaler     Nicotine dependence, uncomplicated, unspecified nicotine product type       Relevant  Medications   budesonide-glycopyrrolate -formoterol  (BREZTRI AEROSPHERE) 160-9-4.8 MCG/ACT AERO inhaler   Other Relevant Orders   CT CHEST LUNG CA SCREEN LOW DOSE W/O CM       Return in about 1 week (around 07/29/2024).   Total time spent: 30 minutes. This time includes review of previous notes and results and patient face to face interaction during today's visit.    FERNAND FREDY RAMAN, MD  07/22/2024   This document may have been prepared by [facility] Voice Recognition software and as such may include  unintentional dictation errors.     [1]  Allergies Allergen Reactions   Requip [Ropinirole Hcl] Anaphylaxis    Throat closing   Celebrex [Celecoxib] Itching   Meloxicam Palpitations   Nitrofurantoin Palpitations   Ranexa [Ranolazine] Hives  [2]  Outpatient Medications Prior to Visit  Medication Sig   acetaminophen  (TYLENOL ) 500 MG tablet Take 1,000 mg by mouth every 6 (six) hours as needed for moderate pain (pain score 4-6) or fever.   albuterol  (VENTOLIN  HFA) 108 (90 Base) MCG/ACT inhaler INHALE 2 PUFFS BY MOUTH 4 TIMES A DAY AS NEEDED   amLODipine  (NORVASC ) 10 MG tablet Take 1 tablet (10 mg total) by mouth daily.   aspirin  EC 81 MG tablet Take 1 tablet (81 mg total) by mouth daily.   atorvastatin  (LIPITOR) 40 MG tablet TAKE 1 TABLET BY MOUTH EVERY DAY   clopidogrel  (PLAVIX ) 75 MG tablet TAKE 1 TABLET BY MOUTH EVERY DAY   DULoxetine  (CYMBALTA ) 60 MG capsule TAKE 1 CAPSULE BY MOUTH EVERY DAY   furosemide  (LASIX ) 20 MG tablet Take 1 tablet (20 mg total) by mouth as needed.   isosorbide  mononitrate (IMDUR ) 120 MG 24 hr tablet TAKE 1 TABLET BY MOUTH EVERY DAY   lisinopril  (ZESTRIL ) 20 MG tablet TAKE 1 TABLET BY MOUTH EVERY DAY   meclizine  (ANTIVERT ) 25 MG tablet Take 1 tablet (25 mg total) by mouth 3 (three) times daily as needed for dizziness.   metoprolol  succinate (TOPROL -XL) 50 MG 24 hr tablet TAKE 1 TABLET BY MOUTH EVERY DAY   nitroGLYCERIN  (NITROSTAT ) 0.4 MG SL tablet Place 1  tablet (0.4 mg total) under the tongue every 5 (five) minutes as needed for chest pain.   pantoprazole  (PROTONIX ) 40 MG tablet TAKE 1 TABLET BY MOUTH TWICE A DAY   [DISCONTINUED] traZODone  (DESYREL ) 50 MG tablet TAKE 2 TABLETS BY MOUTH AT BEDTIME   No facility-administered medications prior to visit.   "

## 2024-07-29 ENCOUNTER — Encounter: Payer: Self-pay | Admitting: Internal Medicine

## 2024-07-29 ENCOUNTER — Ambulatory Visit (INDEPENDENT_AMBULATORY_CARE_PROVIDER_SITE_OTHER): Admitting: Internal Medicine

## 2024-07-29 VITALS — BP 130/78 | HR 80 | Ht 62.0 in | Wt 158.8 lb

## 2024-07-29 DIAGNOSIS — G2581 Restless legs syndrome: Secondary | ICD-10-CM

## 2024-07-29 DIAGNOSIS — E0849 Diabetes mellitus due to underlying condition with other diabetic neurological complication: Secondary | ICD-10-CM

## 2024-07-29 DIAGNOSIS — Z0001 Encounter for general adult medical examination with abnormal findings: Secondary | ICD-10-CM

## 2024-07-29 DIAGNOSIS — K219 Gastro-esophageal reflux disease without esophagitis: Secondary | ICD-10-CM

## 2024-07-29 DIAGNOSIS — J441 Chronic obstructive pulmonary disease with (acute) exacerbation: Secondary | ICD-10-CM | POA: Diagnosis not present

## 2024-07-29 DIAGNOSIS — C9110 Chronic lymphocytic leukemia of B-cell type not having achieved remission: Secondary | ICD-10-CM | POA: Diagnosis not present

## 2024-07-29 DIAGNOSIS — F411 Generalized anxiety disorder: Secondary | ICD-10-CM

## 2024-07-29 DIAGNOSIS — Z1231 Encounter for screening mammogram for malignant neoplasm of breast: Secondary | ICD-10-CM

## 2024-07-29 DIAGNOSIS — I5033 Acute on chronic diastolic (congestive) heart failure: Secondary | ICD-10-CM

## 2024-07-29 DIAGNOSIS — F17200 Nicotine dependence, unspecified, uncomplicated: Secondary | ICD-10-CM

## 2024-07-29 DIAGNOSIS — I480 Paroxysmal atrial fibrillation: Secondary | ICD-10-CM

## 2024-07-29 DIAGNOSIS — I10 Essential (primary) hypertension: Secondary | ICD-10-CM

## 2024-07-29 DIAGNOSIS — Z860101 Personal history of adenomatous and serrated colon polyps: Secondary | ICD-10-CM | POA: Diagnosis not present

## 2024-07-29 DIAGNOSIS — G4733 Obstructive sleep apnea (adult) (pediatric): Secondary | ICD-10-CM

## 2024-07-29 DIAGNOSIS — N1832 Chronic kidney disease, stage 3b: Secondary | ICD-10-CM

## 2024-07-29 DIAGNOSIS — E782 Mixed hyperlipidemia: Secondary | ICD-10-CM

## 2024-07-29 DIAGNOSIS — F17210 Nicotine dependence, cigarettes, uncomplicated: Secondary | ICD-10-CM

## 2024-07-29 DIAGNOSIS — Z0000 Encounter for general adult medical examination without abnormal findings: Secondary | ICD-10-CM

## 2024-07-29 DIAGNOSIS — R7303 Prediabetes: Secondary | ICD-10-CM | POA: Diagnosis not present

## 2024-07-29 DIAGNOSIS — J438 Other emphysema: Secondary | ICD-10-CM

## 2024-07-29 MED ORDER — BREZTRI AEROSPHERE 160-9-4.8 MCG/ACT IN AERO: 2.0000 | INHALATION_SPRAY | Freq: Two times a day (BID) | RESPIRATORY_TRACT | 3 refills | Status: AC

## 2024-07-29 NOTE — Progress Notes (Signed)
 "  Established Patient Office Visit  Subjective:  Patient ID: Karen [name], female    DOB: February 02, 1946  Age: 79 y.o. MRN: [id number]  Chief Complaint  Patient presents with   Follow-up    1 week follow up    Patient comes in for her follow-up and AWV today.  She has completed prednisone  and Z-Pak for her COPD exacerbation and is feeling much better today.  Denies any shortness of breath, no coughing and no wheezing.  She reports that she was not able to get her Breztri  inhaler but will pick it up now. She will now get her flu vaccine.  She also needs Prevnar 20 and Shingrix. Patient has her mammogram scheduled for tomorrow. Her DEXA scan was done 07/2023- Osteoporosis- currently on Fosamax. Patient is under care of oncology for her CLL. She also has history of colon polyps, she is overdue for colonoscopy, will send GI referral for to discuss. Her PHQ-9/GAD-7 score is 4/0. Her CFS score is 4. Her low-dose CT chest for 6 lung cancer screening has been ordered but has not been scheduled yet.  Patient continues to smoke cigarettes daily.    No other concerns at this time.   Past Medical History:  Diagnosis Date   Anxiety    Artery occlusion    Asthma    Back pain    Bronchitis    Cancer (HCC)    Skin Cancer   CLL (chronic lymphocytic leukemia) (HCC)    Coronary artery disease    Depression    Dyspnea    GERD (gastroesophageal reflux disease)    Herpes    History of kidney stones    Hypercholesteremia    Hyperlipemia    Hypertension    Hypothyroidism    Myocardial infarction (HCC) 1999   Neuropathy    Restless leg syndrome    Sleep apnea    use C-PAP   Wears dentures    full upper, partial lower.  doesn't currently wear    Past Surgical History:  Procedure Laterality Date   COLONOSCOPY WITH PROPOFOL  N/A 11/20/2016   Procedure: COLONOSCOPY WITH PROPOFOL ;  Surgeon: Rogelia Copping, MD;  Location: [facility];  Service: Endoscopy;  Laterality: N/A;  sleep apnea    CORONARY ANGIOPLASTY WITH STENT PLACEMENT  2008   DILATION AND CURETTAGE OF UTERUS     ESOPHAGOGASTRODUODENOSCOPY (EGD) WITH PROPOFOL  N/A 03/09/2022   Procedure: ESOPHAGOGASTRODUODENOSCOPY (EGD) WITH PROPOFOL ;  Surgeon: Copping Rogelia, MD;  Location: [facility];  Service: Endoscopy;  Laterality: N/A;   EYE SURGERY Bilateral    Cataract Extraction with IOL   FRACTURE SURGERY     HAND SURGERY Left 12/2017   upper hand fracture repair   LEFT HEART CATH AND CORONARY ANGIOGRAPHY N/A 06/29/2017   Procedure: LEFT HEART CATH AND CORONARY ANGIOGRAPHY;  Surgeon: Fernand Denyse LABOR, MD;  Location: [facility];  Service: Cardiovascular;  Laterality: N/A;   LEFT HEART CATH AND CORONARY ANGIOGRAPHY Left 12/02/2018   Procedure: LEFT HEART CATH AND CORONARY ANGIOGRAPHY;  Surgeon: Fernand Denyse LABOR, MD;  Location: [facility];  Service: Cardiovascular;  Laterality: Left;   OPEN REDUCTION INTERNAL FIXATION (ORIF) DISTAL RADIAL FRACTURE Right 08/12/2018   Procedure: OPEN REDUCTION INTERNAL FIXATION (ORIF) DISTAL RADIAL FRACTURE;  Surgeon: Cleotilde Barrio, MD;  Location: [facility];  Service: Orthopedics;  Laterality: Right;   POLYPECTOMY  11/20/2016   Procedure: POLYPECTOMY;  Surgeon: Rogelia Copping, MD;  [facility];  Service: Endoscopy;;   TUBAL LIGATION  VAGINAL HYSTERECTOMY     VULVECTOMY Right 01/31/2016   Procedure: WIDE EXCISION VULVECTOMY-RIGHT LABIA;  Surgeon: Gladis DELENA Dollar, MD;  Location: [facility];  Service: Gynecology;  Laterality: Right;    Social History   Socioeconomic History   Marital status: Single    Spouse name: Not on file   Number of children: 5   Years of education: Not on file   Highest education level: Not on file  Occupational History   Occupation: part time job    Comment: [employer]  Tobacco Use   Smoking status: Every Day    Current packs/day: 1.00    Average packs/day: 1 pack/day for 35.0 years (35.0 ttl pk-yrs)    Types:  Cigarettes   Smokeless tobacco: Current    Types: Snuff  Vaping Use   Vaping status: Never Used  Substance and Sexual Activity   Alcohol use: No    Alcohol/week: 0.0 standard drinks of alcohol   Drug use: No   Sexual activity: Not Currently    Partners: Male    Birth control/protection: Surgical  Other Topics Concern   Not on file  Social History Narrative   Not on file   Social Drivers of Health   Tobacco Use: High Risk (07/29/2024)   Patient History    Smoking Tobacco Use: Every Day    Smokeless Tobacco Use: Current    Passive Exposure: Not on file  Financial Resource Strain: Not on file  Food Insecurity: No Food Insecurity (07/29/2024)   Epic    Worried About Programme Researcher, Broadcasting/film/video in the Last Year: Never true    Ran Out of Food in the Last Year: Never true  Transportation Needs: No Transportation Needs (07/29/2024)   Epic    Lack of Transportation (Medical): No    Lack of Transportation (Non-Medical): No  Physical Activity: Inactive (05/14/2023)   Exercise Vital Sign    Days of Exercise per Week: 0 days    Minutes of Exercise per Session: 0 min  Stress: No Stress Concern Present (05/14/2023)   [employer] of Occupational [employer] - Occupational Stress Questionnaire    Feeling of Stress : Only a little  Social Connections: Not on file  Intimate Partner Violence: Not At Risk (07/29/2024)   Epic    Fear of Current or Ex-Partner: No    Emotionally Abused: No    Physically Abused: No    Sexually Abused: No  Depression (PHQ2-9): Low Risk (07/29/2024)   Depression (PHQ2-9)    PHQ-2 Score: 4  Alcohol Screen: Low Risk (07/29/2024)   Alcohol Screen    Last Alcohol Screening Score (AUDIT): 0  Housing: Unknown (07/29/2024)   Epic    Unable to Pay for Housing in the Last Year: No    Number of Times Moved in the Last Year: Not on file    Homeless in the Last Year: No  Utilities: Not At Risk (07/29/2024)   Epic    Threatened with loss of utilities: No  Health Literacy: Adequate  Health Literacy (07/29/2024)   B1300 Health Literacy    Frequency of need for help with medical instructions: Never    Family History  Problem Relation Age of Onset   Heart disease Father    Stroke Mother    Hypertension Mother    Lung cancer Brother    Heart attack Brother    Congestive Heart Failure Sister    Cervical cancer Sister    Cancer Neg Hx    Diabetes Neg Hx  Breast cancer Neg Hx     Allergies[1]  Show/hide medication list[2]  Review of Systems  Constitutional: Negative.  Negative for chills, fever and malaise/fatigue.  HENT: Negative.  Negative for congestion and sore throat.   Eyes: Negative.  Negative for blurred vision and pain.  Respiratory: Negative.  Negative for cough and shortness of breath.   Cardiovascular: Negative.  Negative for chest pain, palpitations and leg swelling.  Gastrointestinal: Negative.  Negative for abdominal pain, blood in stool, constipation, diarrhea, heartburn, melena, nausea and vomiting.  Genitourinary: Negative.  Negative for dysuria, flank pain, frequency and urgency.  Musculoskeletal: Negative.  Negative for joint pain and myalgias.  Skin: Negative.   Neurological: Negative.  Negative for dizziness, tingling, sensory change, weakness and headaches.  Endo/Heme/Allergies: Negative.   Psychiatric/Behavioral: Negative.  Negative for depression and suicidal ideas. The patient is not nervous/anxious.        Objective:   BP 130/78   Pulse 80   Ht 5' 2 (1.575 m)   Wt 158 lb 12.8 oz (72 kg)   SpO2 97%   BMI 29.04 kg/m   Vitals:   07/29/24 1419  BP: 130/78  Pulse: 80  Height: 5' 2 (1.575 m)  Weight: 158 lb 12.8 oz (72 kg)  SpO2: 97%  BMI (Calculated): 29.04    Physical Exam Vitals and nursing note reviewed.  Constitutional:      Appearance: Normal appearance.  HENT:     Head: Normocephalic and atraumatic.     Nose: Nose normal.     Mouth/Throat:     Mouth: Mucous membranes are moist.     Pharynx: Oropharynx is  clear.  Eyes:     Conjunctiva/sclera: Conjunctivae normal.     Pupils: Pupils are equal, round, and reactive to light.  Cardiovascular:     Rate and Rhythm: Normal rate and regular rhythm.     Pulses: Normal pulses.     Heart sounds: Normal heart sounds. No murmur heard. Pulmonary:     Effort: Pulmonary effort is normal.     Breath sounds: Normal breath sounds. No wheezing.  Abdominal:     General: Bowel sounds are normal.     Palpations: Abdomen is soft.     Tenderness: There is no abdominal tenderness. There is no right CVA tenderness or left CVA tenderness.  Musculoskeletal:        General: Normal range of motion.     Cervical back: Normal range of motion.     Right lower leg: No edema.     Left lower leg: No edema.  Skin:    General: Skin is warm and dry.  Neurological:     General: No focal deficit present.     Mental Status: She is alert and oriented to person, place, and time.  Psychiatric:        Mood and Affect: Mood normal.        Behavior: Behavior normal.      No results found for any visits on 07/29/24.  Recent Results (from the past 2160 hours)  CBC with Differential (Cancer Center Only)     Status: Abnormal   Collection Time: 06/11/24  2:24 PM  Result Value Ref Range   WBC Count 83.3 (HH) 4.0 - 10.5 K/uL    Comment: REPEATED TO VERIFY This critical result has been called to Almarie Nett by Luke Horn on 06/11/2024 14:55:36, and has been read back.    RBC 3.67 (L) 3.87 - 5.11 MIL/uL   Hemoglobin 10.8 (  L) 12.0 - 15.0 g/dL   HCT 65.8 (L) 63.9 - 53.9 %   MCV 92.9 80.0 - 100.0 fL   MCH 29.4 26.0 - 34.0 pg   MCHC 31.7 30.0 - 36.0 g/dL   RDW 86.1 88.4 - 84.4 %   Platelet Count 185 150 - 400 K/uL   nRBC 0.0 0.0 - 0.2 %   Neutrophils Relative % 3 %   Neutro Abs 2.6 1.7 - 7.7 K/uL   Lymphocytes Relative 90 %   Lymphs Abs 74.4 (H) 0.7 - 4.0 K/uL   Monocytes Relative 6 %   Monocytes Absolute 5.3 (H) 0.1 - 1.0 K/uL   Eosinophils Relative 0 %   Eosinophils  Absolute 0.3 0.0 - 0.5 K/uL   Basophils Relative 1 %   Basophils Absolute 0.6 (H) 0.0 - 0.1 K/uL   WBC Morphology See Note     Comment: Lymphocytosis with morphology consistent with known diagnosis of CLL.   RBC Morphology MORPHOLOGY UNREMARKABLE    Smear Review Normal platelet morphology    Immature Granulocytes 0 %   Abs Immature Granulocytes 0.16 (H) 0.00 - 0.07 K/uL    Comment: Performed at [facility], [address] [place], [place] [address]  CMP (Cancer Center only)     Status: Abnormal   Collection Time: 06/11/24  2:24 PM  Result Value Ref Range   Sodium 131 (L) 135 - 145 mmol/L   Potassium 4.7 3.5 - 5.1 mmol/L   Chloride 100 98 - 111 mmol/L   CO2 24 22 - 32 mmol/L   Glucose, Bld 113 (H) 70 - 99 mg/dL    Comment: Glucose reference range applies only to samples taken after fasting for at least 8 hours.   BUN 15 8 - 23 mg/dL   Creatinine 8.72 (H) 9.55 - 1.00 mg/dL   Calcium  9.5 8.9 - 10.3 mg/dL   Total Protein 6.5 6.5 - 8.1 g/dL   Albumin 4.0 3.5 - 5.0 g/dL   AST 16 15 - 41 U/L   ALT 14 0 - 44 U/L   Alkaline Phosphatase 62 38 - 126 U/L   Total Bilirubin 0.6 0.0 - 1.2 mg/dL   GFR, Estimated 43 (L) >60 mL/min    Comment: (NOTE) Calculated using the CKD-EPI Creatinine Equation (2021)    Anion gap 7 5 - 15    Comment: Performed at [facility], [address] [place], [place] [address]      Assessment & Plan:  Continue current medications.  Patient will get her flu, Shingrix, Prevnar 20.  Low-dose CT chest.  GI referral for colon polyps. Problem List Items Addressed This Visit       Cardiovascular and Mediastinum   Paroxysmal atrial fibrillation (HCC)   Essential hypertension, benign   Relevant Orders   CMP14+EGFR   CHF (congestive heart failure), NYHA class III, acute on chronic, diastolic (HCC)     Respiratory   OSA on CPAP   Chronic obstructive pulmonary disease (HCC)   Relevant Medications   budesonide-glycopyrrolate -formoterol  (BREZTRI   AEROSPHERE) 160-9-4.8 MCG/ACT AERO inhaler     Endocrine   Diabetes due to undrl condition w oth diabetic neuro comp (HCC)     Other   CLL (chronic lymphocytic leukemia) (HCC) (Chronic)   Mixed hyperlipidemia   Relevant Orders   Lipid Panel w/o Chol/HDL Ratio   GAD (generalized anxiety disorder)   Prediabetes   Relevant Orders   Hemoglobin A1c   Restless legs syndrome   Other Visit Diagnoses  Medicare annual wellness visit, subsequent    -  Primary     COPD with acute exacerbation (HCC)       Relevant Medications   budesonide-glycopyrrolate -formoterol  (BREZTRI  AEROSPHERE) 160-9-4.8 MCG/ACT AERO inhaler     Gastroesophageal reflux disease without esophagitis         Nicotine dependence, uncomplicated, unspecified nicotine product type       Relevant Medications   budesonide-glycopyrrolate -formoterol  (BREZTRI  AEROSPHERE) 160-9-4.8 MCG/ACT AERO inhaler     Personal history of adenomatous and serrated colon polyps       Relevant Orders   Ambulatory referral to Gastroenterology     CKD stage 3b, GFR 30-44 ml/min (HCC)           Return in about 3 months (around 10/27/2024).   Total time spent: 30 minutes. This time includes review of previous notes and results and patient face to face interaction during today's visit.    FERNAND FREDY RAMAN, MD  07/29/2024   This document may have been prepared by [facility] Voice Recognition software and as such may include unintentional dictation errors.     [1]  Allergies Allergen Reactions   Requip [Ropinirole Hcl] Anaphylaxis    Throat closing   Celebrex [Celecoxib] Itching   Meloxicam Palpitations   Nitrofurantoin Palpitations   Ranexa [Ranolazine] Hives  [2]  Outpatient Medications Prior to Visit  Medication Sig   acetaminophen  (TYLENOL ) 500 MG tablet Take 1,000 mg by mouth every 6 (six) hours as needed for moderate pain (pain score 4-6) or fever.   albuterol  (VENTOLIN  HFA) 108 (90 Base) MCG/ACT inhaler INHALE 2 PUFFS BY MOUTH 4  TIMES A DAY AS NEEDED   amLODipine  (NORVASC ) 10 MG tablet Take 1 tablet (10 mg total) by mouth daily.   aspirin  EC 81 MG tablet Take 1 tablet (81 mg total) by mouth daily.   atorvastatin  (LIPITOR) 40 MG tablet TAKE 1 TABLET BY MOUTH EVERY DAY   clopidogrel  (PLAVIX ) 75 MG tablet TAKE 1 TABLET BY MOUTH EVERY DAY   DULoxetine  (CYMBALTA ) 60 MG capsule TAKE 1 CAPSULE BY MOUTH EVERY DAY   furosemide  (LASIX ) 20 MG tablet Take 1 tablet (20 mg total) by mouth as needed.   gabapentin  (NEURONTIN ) 100 MG capsule Take 1 capsule (100 mg total) by mouth daily.   isosorbide  mononitrate (IMDUR ) 120 MG 24 hr tablet TAKE 1 TABLET BY MOUTH EVERY DAY   lisinopril  (ZESTRIL ) 20 MG tablet TAKE 1 TABLET BY MOUTH EVERY DAY   meclizine  (ANTIVERT ) 25 MG tablet Take 1 tablet (25 mg total) by mouth 3 (three) times daily as needed for dizziness.   metoprolol  succinate (TOPROL -XL) 50 MG 24 hr tablet TAKE 1 TABLET BY MOUTH EVERY DAY   nitroGLYCERIN  (NITROSTAT ) 0.4 MG SL tablet Place 1 tablet (0.4 mg total) under the tongue every 5 (five) minutes as needed for chest pain.   pantoprazole  (PROTONIX ) 40 MG tablet TAKE 1 TABLET BY MOUTH TWICE A DAY   predniSONE  (DELTASONE ) 20 MG tablet Take 2 tablets (40 mg total) by mouth daily with breakfast. (Patient not taking: Reported on 07/29/2024)   [DISCONTINUED] budesonide-glycopyrrolate -formoterol  (BREZTRI  AEROSPHERE) 160-9-4.8 MCG/ACT AERO inhaler Inhale 2 puffs into the lungs in the morning and at bedtime. (Patient not taking: Reported on 07/29/2024)   No facility-administered medications prior to visit.   "

## 2024-07-30 ENCOUNTER — Ambulatory Visit
Admission: RE | Admit: 2024-07-30 | Discharge: 2024-07-30 | Disposition: A | Discharge status: Home / Self Care | Source: Ambulatory Visit | Attending: Internal Medicine | Admitting: Internal Medicine

## 2024-07-30 DIAGNOSIS — Z1231 Encounter for screening mammogram for malignant neoplasm of breast: Secondary | ICD-10-CM | POA: Diagnosis present

## 2024-07-30 LAB — LIPID PANEL W/O CHOL/HDL RATIO
Cholesterol, Total: 101 mg/dL (ref 100–199)
HDL: 37 mg/dL — ABNORMAL LOW
LDL Chol Calc (NIH): 35 mg/dL (ref 0–99)
Triglycerides: 173 mg/dL — ABNORMAL HIGH (ref 0–149)
VLDL Cholesterol Cal: 29 mg/dL (ref 5–40)

## 2024-07-30 LAB — CMP14+EGFR
ALT: 12 IU/L (ref 0–32)
AST: 10 IU/L (ref 0–40)
Albumin: 4.1 g/dL (ref 3.8–4.8)
Alkaline Phosphatase: 61 IU/L (ref 49–135)
BUN/Creatinine Ratio: 20 (ref 12–28)
BUN: 26 mg/dL (ref 8–27)
Bilirubin Total: 0.3 mg/dL (ref 0.0–1.2)
CO2: 23 mmol/L (ref 20–29)
Calcium: 9.8 mg/dL (ref 8.7–10.3)
Chloride: 100 mmol/L (ref 96–106)
Creatinine, Ser: 1.31 mg/dL — ABNORMAL HIGH (ref 0.57–1.00)
Globulin, Total: 2 g/dL (ref 1.5–4.5)
Glucose: 88 mg/dL (ref 70–99)
Potassium: 3.6 mmol/L (ref 3.5–5.2)
Sodium: 134 mmol/L (ref 134–144)
Total Protein: 6.1 g/dL (ref 6.0–8.5)
eGFR: 41 mL/min/1.73 — ABNORMAL LOW

## 2024-07-30 LAB — HEMOGLOBIN A1C
Est. average glucose Bld gHb Est-mCnc: 123 mg/dL
Hgb A1c MFr Bld: 5.9 % — ABNORMAL HIGH (ref 4.8–5.6)

## 2024-07-31 ENCOUNTER — Ambulatory Visit: Payer: Self-pay | Admitting: Internal Medicine

## 2024-07-31 NOTE — Progress Notes (Signed)
 Pt informed

## 2024-07-31 NOTE — Progress Notes (Signed)
 Left vm

## 2024-08-19 ENCOUNTER — Ambulatory Visit: Admitting: Cardiovascular Disease

## 2024-08-22 ENCOUNTER — Ambulatory Visit: Admitting: Cardiology

## 2024-08-26 ENCOUNTER — Ambulatory Visit: Admitting: Cardiovascular Disease

## 2024-09-02 ENCOUNTER — Ambulatory Visit: Admitting: Cardiovascular Disease

## 2024-11-04 ENCOUNTER — Ambulatory Visit: Admitting: Internal Medicine

## 2024-12-11 ENCOUNTER — Inpatient Hospital Stay: Admitting: Oncology

## 2024-12-11 ENCOUNTER — Inpatient Hospital Stay
